# Patient Record
Sex: Female | Born: 1980 | Race: Black or African American | Hispanic: No | Marital: Married | Smoking: Former smoker
Health system: Southern US, Community
[De-identification: ages and names within clinical notes are randomized; demographics above are authoritative.]

## PROBLEM LIST (undated history)

## (undated) DIAGNOSIS — N920 Excessive and frequent menstruation with regular cycle: Secondary | ICD-10-CM

## (undated) DIAGNOSIS — E119 Type 2 diabetes mellitus without complications: Secondary | ICD-10-CM

## (undated) DIAGNOSIS — I739 Peripheral vascular disease, unspecified: Secondary | ICD-10-CM

## (undated) DIAGNOSIS — R519 Headache, unspecified: Secondary | ICD-10-CM

## (undated) DIAGNOSIS — G4733 Obstructive sleep apnea (adult) (pediatric): Secondary | ICD-10-CM

## (undated) DIAGNOSIS — I10 Essential (primary) hypertension: Secondary | ICD-10-CM

## (undated) DIAGNOSIS — N939 Abnormal uterine and vaginal bleeding, unspecified: Secondary | ICD-10-CM

## (undated) HISTORY — DX: Peripheral vascular disease, unspecified: I73.9

## (undated) HISTORY — PX: OTHER SURGICAL HISTORY: SHX169

---

## 2000-06-05 ENCOUNTER — Emergency Department (HOSPITAL_COMMUNITY): Admission: EM | Admit: 2000-06-05 | Discharge: 2000-06-05 | Payer: Self-pay | Admitting: Emergency Medicine

## 2004-03-31 ENCOUNTER — Emergency Department: Payer: Self-pay | Admitting: Emergency Medicine

## 2004-04-01 ENCOUNTER — Ambulatory Visit: Payer: Self-pay | Admitting: Emergency Medicine

## 2004-04-04 ENCOUNTER — Inpatient Hospital Stay: Payer: Self-pay

## 2004-04-09 ENCOUNTER — Ambulatory Visit: Payer: Self-pay | Admitting: Family Medicine

## 2004-04-09 ENCOUNTER — Observation Stay: Payer: Self-pay

## 2004-04-09 ENCOUNTER — Inpatient Hospital Stay (HOSPITAL_COMMUNITY): Admission: AD | Admit: 2004-04-09 | Discharge: 2004-04-11 | Payer: Self-pay | Admitting: *Deleted

## 2004-04-21 ENCOUNTER — Observation Stay: Payer: Self-pay

## 2004-04-22 ENCOUNTER — Inpatient Hospital Stay: Payer: Self-pay

## 2004-04-23 ENCOUNTER — Ambulatory Visit: Payer: Self-pay | Admitting: Obstetrics and Gynecology

## 2004-04-29 ENCOUNTER — Inpatient Hospital Stay: Payer: Self-pay | Admitting: Obstetrics and Gynecology

## 2004-06-04 ENCOUNTER — Emergency Department: Payer: Self-pay | Admitting: General Practice

## 2004-11-26 ENCOUNTER — Emergency Department: Payer: Self-pay | Admitting: Emergency Medicine

## 2005-04-02 ENCOUNTER — Emergency Department: Payer: Self-pay | Admitting: Emergency Medicine

## 2006-01-28 ENCOUNTER — Inpatient Hospital Stay: Payer: Self-pay | Admitting: Internal Medicine

## 2008-06-21 ENCOUNTER — Emergency Department: Payer: Self-pay | Admitting: Emergency Medicine

## 2009-02-03 DIAGNOSIS — S52041A Displaced fracture of coronoid process of right ulna, initial encounter for closed fracture: Secondary | ICD-10-CM

## 2009-02-03 HISTORY — DX: Displaced fracture of coronoid process of right ulna, initial encounter for closed fracture: S52.041A

## 2009-04-10 ENCOUNTER — Emergency Department: Payer: Self-pay | Admitting: Emergency Medicine

## 2009-04-12 ENCOUNTER — Emergency Department: Payer: Self-pay | Admitting: Emergency Medicine

## 2009-04-24 ENCOUNTER — Emergency Department: Payer: Self-pay | Admitting: Emergency Medicine

## 2009-04-25 ENCOUNTER — Emergency Department: Payer: Self-pay

## 2009-05-05 ENCOUNTER — Emergency Department: Payer: Self-pay | Admitting: Emergency Medicine

## 2009-05-24 ENCOUNTER — Emergency Department: Payer: Self-pay | Admitting: Emergency Medicine

## 2009-06-04 ENCOUNTER — Encounter: Payer: Self-pay | Admitting: Obstetrics & Gynecology

## 2009-06-06 ENCOUNTER — Inpatient Hospital Stay: Payer: Self-pay | Admitting: Obstetrics and Gynecology

## 2009-06-11 ENCOUNTER — Encounter: Payer: Self-pay | Admitting: Obstetrics & Gynecology

## 2009-06-21 ENCOUNTER — Observation Stay: Payer: Self-pay | Admitting: Obstetrics and Gynecology

## 2009-11-22 ENCOUNTER — Inpatient Hospital Stay: Payer: Self-pay | Admitting: Obstetrics and Gynecology

## 2009-11-27 LAB — PATHOLOGY REPORT

## 2010-07-29 ENCOUNTER — Emergency Department: Payer: Self-pay | Admitting: Emergency Medicine

## 2011-10-30 ENCOUNTER — Emergency Department: Payer: Self-pay | Admitting: Emergency Medicine

## 2011-11-02 ENCOUNTER — Emergency Department: Payer: Self-pay | Admitting: Emergency Medicine

## 2012-06-20 ENCOUNTER — Emergency Department: Payer: Self-pay | Admitting: Emergency Medicine

## 2013-05-28 ENCOUNTER — Emergency Department: Payer: Self-pay | Admitting: Emergency Medicine

## 2013-05-29 LAB — BASIC METABOLIC PANEL
Anion Gap: 5 — ABNORMAL LOW (ref 7–16)
BUN: 11 mg/dL (ref 7–18)
CHLORIDE: 106 mmol/L (ref 98–107)
CREATININE: 0.96 mg/dL (ref 0.60–1.30)
Calcium, Total: 9.2 mg/dL (ref 8.5–10.1)
Co2: 27 mmol/L (ref 21–32)
EGFR (African American): >60
Glucose: 197 mg/dL — ABNORMAL HIGH (ref 65–99)
Osmolality: 281 (ref 275–301)
Potassium: 3.7 mmol/L (ref 3.5–5.1)
Sodium: 138 mmol/L (ref 136–145)

## 2013-05-29 LAB — CBC
HCT: 35.1 % (ref 35.0–47.0)
HGB: 11 g/dL — ABNORMAL LOW (ref 12.0–16.0)
MCH: 22.1 pg — AB (ref 26.0–34.0)
MCHC: 31.3 g/dL — ABNORMAL LOW (ref 32.0–36.0)
MCV: 71 fL — AB (ref 80–100)
Platelet: 275 10*3/uL (ref 150–440)
RBC: 4.98 10*6/uL (ref 3.80–5.20)
RDW: 17.9 % — ABNORMAL HIGH (ref 11.5–14.5)
WBC: 9.9 10*3/uL (ref 3.6–11.0)

## 2013-05-29 LAB — URINALYSIS, COMPLETE
BILIRUBIN, UR: NEGATIVE
Bacteria: NONE SEEN
Ketone: NEGATIVE
LEUKOCYTE ESTERASE: NEGATIVE
NITRITE: NEGATIVE
PH: 6 (ref 4.5–8.0)
Protein: NEGATIVE
RBC,UR: <1 /HPF (ref 0–5)
SPECIFIC GRAVITY: 1.015 (ref 1.003–1.030)
WBC UR: 3 /HPF (ref 0–5)

## 2013-05-29 LAB — PRO B NATRIURETIC PEPTIDE: B-TYPE NATIURETIC PEPTID: 7 pg/mL (ref 0–125)

## 2013-05-29 LAB — TROPONIN I
Troponin-I: <0.02 ng/mL
Troponin-I: <0.02 ng/mL

## 2013-06-01 ENCOUNTER — Emergency Department: Payer: Self-pay | Admitting: Internal Medicine

## 2013-06-01 LAB — CBC
HCT: 39.4 % (ref 35.0–47.0)
HGB: 12 g/dL (ref 12.0–16.0)
MCH: 21.5 pg — AB (ref 26.0–34.0)
MCHC: 30.4 g/dL — AB (ref 32.0–36.0)
MCV: 71 fL — ABNORMAL LOW (ref 80–100)
PLATELETS: 325 10*3/uL (ref 150–440)
RBC: 5.57 10*6/uL — ABNORMAL HIGH (ref 3.80–5.20)
RDW: 18.1 % — ABNORMAL HIGH (ref 11.5–14.5)
WBC: 10.8 10*3/uL (ref 3.6–11.0)

## 2013-06-01 LAB — BASIC METABOLIC PANEL
Anion Gap: 7 (ref 7–16)
BUN: 11 mg/dL (ref 7–18)
CO2: 25 mmol/L (ref 21–32)
Calcium, Total: 9.1 mg/dL (ref 8.5–10.1)
Chloride: 103 mmol/L (ref 98–107)
Creatinine: 0.79 mg/dL (ref 0.60–1.30)
EGFR (African American): >60
EGFR (Non-African Amer.): >60
Glucose: 179 mg/dL — ABNORMAL HIGH (ref 65–99)
OSMOLALITY: 274 (ref 275–301)
Potassium: 3.6 mmol/L (ref 3.5–5.1)
Sodium: 135 mmol/L — ABNORMAL LOW (ref 136–145)

## 2013-06-01 LAB — TROPONIN I: Troponin-I: <0.02 ng/mL

## 2014-10-04 ENCOUNTER — Encounter: Payer: Self-pay | Admitting: Emergency Medicine

## 2014-10-04 ENCOUNTER — Emergency Department
Admission: EM | Admit: 2014-10-04 | Discharge: 2014-10-04 | Disposition: A | Discharge status: Home / Self Care | Payer: Self-pay | Attending: Emergency Medicine | Admitting: Emergency Medicine

## 2014-10-04 DIAGNOSIS — E1165 Type 2 diabetes mellitus with hyperglycemia: Secondary | ICD-10-CM | POA: Insufficient documentation

## 2014-10-04 DIAGNOSIS — Z79899 Other long term (current) drug therapy: Secondary | ICD-10-CM | POA: Insufficient documentation

## 2014-10-04 DIAGNOSIS — Z794 Long term (current) use of insulin: Secondary | ICD-10-CM | POA: Insufficient documentation

## 2014-10-04 HISTORY — DX: Type 2 diabetes mellitus without complications: E11.9

## 2014-10-04 LAB — CBC
HCT: 34.8 % — ABNORMAL LOW (ref 35.0–47.0)
HEMOGLOBIN: 10.6 g/dL — AB (ref 12.0–16.0)
MCH: 20.9 pg — AB (ref 26.0–34.0)
MCHC: 30.5 g/dL — AB (ref 32.0–36.0)
MCV: 68.5 fL — ABNORMAL LOW (ref 80.0–100.0)
PLATELETS: 364 10*3/uL (ref 150–440)
RBC: 5.08 MIL/uL (ref 3.80–5.20)
RDW: 18.3 % — AB (ref 11.5–14.5)
WBC: 9.5 10*3/uL (ref 3.6–11.0)

## 2014-10-04 LAB — URINALYSIS COMPLETE WITH MICROSCOPIC (ARMC ONLY)
BILIRUBIN URINE: NEGATIVE
Bacteria, UA: NONE SEEN
Ketones, ur: NEGATIVE mg/dL
Nitrite: NEGATIVE
Protein, ur: NEGATIVE mg/dL
SPECIFIC GRAVITY, URINE: 1.022 (ref 1.005–1.030)
pH: 6 (ref 5.0–8.0)

## 2014-10-04 LAB — BASIC METABOLIC PANEL
Anion gap: 10 (ref 5–15)
BUN: 9 mg/dL (ref 6–20)
CALCIUM: 9.1 mg/dL (ref 8.9–10.3)
CO2: 22 mmol/L (ref 22–32)
CREATININE: 0.86 mg/dL (ref 0.44–1.00)
Chloride: 101 mmol/L (ref 101–111)
GFR calc Af Amer: >60 mL/min (ref >60)
GFR calc non Af Amer: >60 mL/min (ref >60)
GLUCOSE: 430 mg/dL — AB (ref 65–99)
Potassium: 3.7 mmol/L (ref 3.5–5.1)
Sodium: 133 mmol/L — ABNORMAL LOW (ref 135–145)

## 2014-10-04 LAB — GLUCOSE, CAPILLARY: Glucose-Capillary: 455 mg/dL — ABNORMAL HIGH (ref 65–99)

## 2014-10-04 MED ORDER — SODIUM CHLORIDE 0.9 % IV BOLUS (SEPSIS)
1000.0000 mL | Freq: Once | INTRAVENOUS | Status: AC
  Administered 2014-10-04: 1000 mL via INTRAVENOUS

## 2014-10-04 MED ORDER — INSULIN ASPART PROT & ASPART (70-30 MIX) 100 UNIT/ML ~~LOC~~ SUSP: 10.0000 [IU] | Freq: Every day | SUBCUTANEOUS | Status: DC

## 2014-10-04 MED ORDER — SODIUM CHLORIDE 0.9 % IV BOLUS (SEPSIS)
1000.0000 mL | Freq: Once | INTRAVENOUS | Status: AC
  Administered 2014-10-04 (×2): 1000 mL via INTRAVENOUS

## 2014-10-04 MED ORDER — INSULIN ASPART PROT & ASPART (70-30 MIX) 100 UNIT/ML ~~LOC~~ SUSP
10.0000 [IU] | Freq: Once | SUBCUTANEOUS | Status: AC
  Administered 2014-10-04: 10 [IU] via SUBCUTANEOUS
  Filled 2014-10-04: qty 10

## 2014-10-04 NOTE — ED Provider Notes (Signed)
[facility] Emergency Department Provider Note  ____________________________________________  Time seen: 4944  I have reviewed the triage vital signs and the nursing notes.   HISTORY  Chief Complaint Hyperglycemia and Flank Pain     HPI Jennifer [name] is a 34 y.o. female who has diabetes. She presents today due to concerns for her elevated blood sugar. The patient was diagnosed with diabetes last year. She goes to [employer]. She has not been back in approximately a year. She has had no change in her insulin regimen. She reports she has been urinating more frequently and her sugar has been running high. She has continued to take the sliding dose of insulin that was first prescribed. She reports this is generally 10 units at a time. She is not sure what type of insulin she is taking. I suspect that it is short acting.  Other than the frequency of urination, she denies any dysuria. She has no abdominal pain.She denies flank pain to me. She does report she has dark urine.  She's had no fevers. She's had no nausea or vomiting.    Past Medical History  Diagnosis Date  . Diabetes mellitus without complication     There are no active problems to display for this patient.   No past surgical history on file.  Current Outpatient Rx  Name  Route  Sig  Dispense  Refill  . acetaminophen (TYLENOL) 500 MG tablet   Oral   Take 1,000 mg by mouth every 6 (six) hours as needed for mild pain or headache.         . lisinopril-hydrochlorothiazide (PRINZIDE,ZESTORETIC) 10-12.5 MG per tablet   Oral   Take 1 tablet by mouth daily.         . insulin aspart protamine- aspart (NOVOLOG MIX 70/30) (70-30) 100 UNIT/ML injection   Subcutaneous   Inject 0.1 mLs (10 Units total) into the skin daily with breakfast.   10 mL   11     Allergies Demerol; Oxycodone; and Toradol  No family history on file.  Social History Social History  Substance Use Topics  .  Smoking status: Not on file  . Smokeless tobacco: Not on file  . Alcohol Use: Not on file    Review of Systems  Constitutional: Negative for fever. General fatigue. ENT: Negative for sore throat. Cardiovascular: Negative for chest pain. Respiratory: Negative for shortness of breath. Gastrointestinal: Negative for abdominal pain, vomiting and diarrhea. Genitourinary: Negative for dysuria. Notable for increased urination. Musculoskeletal: No myalgias or injuries. Skin: Negative for rash. Neurological: Negative for headaches   10-point ROS otherwise negative.  ____________________________________________   PHYSICAL EXAM:  VITAL SIGNS: ED Triage Vitals  Enc Vitals Group     BP 10/04/14 1341 184/99 mmHg     Pulse Rate 10/04/14 1341 104     Resp 10/04/14 1341 18     Temp 10/04/14 1341 98.6 F (37 C)     Temp Source 10/04/14 1341 Oral     SpO2 10/04/14 1341 97 %     Weight 10/04/14 1341 187 lb (84.823 kg)     Height --      Head Cir --      Peak Flow --      Pain Score 10/04/14 1341 8     Pain Loc --      Pain Edu? --      Excl. in [place]? --     Constitutional:  Alert and oriented. Well appearing and in  no distress. ENT   Head: Normocephalic and atraumatic.   Nose: No congestion/rhinnorhea.   Mouth/Throat: Mucous membranes are moist. Cardiovascular: Normal rate, regular rhythm, no murmur noted Respiratory:  Normal respiratory effort, no tachypnea.    Breath sounds are clear and equal bilaterally.  Gastrointestinal: Soft and nontender. No distention.  Back: No muscle spasm, no tenderness, no CVA tenderness. Musculoskeletal: No deformity noted. Nontender with normal range of motion in all extremities.  No noted edema. Neurologic:  Normal speech and language. No gross focal neurologic deficits are appreciated.  Skin:  Skin is warm, dry. No rash noted. Psychiatric: Mood and affect are normal. Speech and behavior are normal.   ____________________________________________    LABS (pertinent positives/negatives)  Labs Reviewed  GLUCOSE, CAPILLARY - Abnormal; Notable for the following:    Glucose-Capillary 455 (*)    All other components within normal limits  BASIC METABOLIC PANEL - Abnormal; Notable for the following:    Sodium 133 (*)    Glucose, Bld 430 (*)    All other components within normal limits  CBC - Abnormal; Notable for the following:    Hemoglobin 10.6 (*)    HCT 34.8 (*)    MCV 68.5 (*)    MCH 20.9 (*)    MCHC 30.5 (*)    RDW 18.3 (*)    All other components within normal limits  URINALYSIS COMPLETEWITH MICROSCOPIC ([facility] ONLY) - Abnormal; Notable for the following:    Color, Urine COLORLESS (*)    APPearance CLEAR (*)    Glucose, UA >500 (*)    Hgb urine dipstick 2+ (*)    Leukocytes, UA TRACE (*)    Squamous Epithelial / LPF 0-5 (*)    All other components within normal limits  HEMOGLOBIN A1C  CBG MONITORING, ED   ____________________________________________   INITIAL IMPRESSION / ASSESSMENT AND PLAN / ED COURSE  Pertinent labs & imaging results that were available during my care of the patient were reviewed by me and considered in my medical decision making (see chart for details).   Pleasant well-appearing 34 year old female in no acute distress. She has history of diabetes, but has not followed up with her regular doctor for ongoing care. I believe her current hyperglycemia is due to inadequate treatment and inadequate follow-up. She does not have a fever or sign of infection that would trigger an elevated glucose level. She does not seem to be that aware of her treatment plan.  Her renal function is good. We will treat her with 2 L of normal saline due to the hyperglycemia. I have discussed a change in her insulin regimen. We will start her on 70/30 and give her 10 units now. The patient understands that as we make changes in her insulin, she could be at risk for hypoglycemia.  She does have a functional glucometer at home. She reports she will check her sugars and keep a log and adjust the doses if needed. I have underscored the importance of close follow-up with [name] for further care.  ____________________________________________   FINAL CLINICAL IMPRESSION(S) / ED DIAGNOSES  Final diagnoses:  Hyperglycemia due to type 2 diabetes mellitus      Ahmed Prima, MD 10/04/14 [phone]

## 2014-10-04 NOTE — ED Notes (Signed)
NAD noted at time of D/C. Pt refused wheelchair to the lobby. Pt denies comments/concerns at this time.

## 2014-10-04 NOTE — Discharge Instructions (Signed)
Your sugar is running high, but we do not see a obvious cause for this. You do not have a urinary tract infection. U appear well otherwise. We have treated you with 2 L of IV fluids and started a slightly longer acting insulin for you. Your are given 70/30, 10 units, in the emergency department. Continue to use 10 units in the morning as long as her sugar is normal to high. Use your other insulin as a sliding scale as previously prescribed. Keep a log of your glucose levels as well as the amount of insulin you're using see her doctor can help tailor your treatment to what she need. Decrease or discontinue the insulin if your blood sugars are getting too low. Most important, follow-up with your regular doctor for long-term care of your diabetes. Return to the emergency department if you have further urgent concerns.  High Blood Sugar High blood sugar (hyperglycemia) means that the level of sugar in your blood is higher than it should be. Signs of high blood sugar include:  Feeling thirsty.  Frequent peeing (urinating).  Feeling tired or sleepy.  Dry mouth.  Vision changes.  Feeling weak.  Feeling hungry but losing weight.  Numbness and tingling in your hands or feet.  Headache. When you ignore these signs, your blood sugar may keep going up. These problems may get worse, and other problems may begin. HOME CARE  Check your blood sugars as told by your doctor. Write down the numbers with the date and time.  Take the right amount of insulin or diabetes pills at the right time. Write down the dose with date and time.  Refill your insulin or diabetes pills before running out.  Watch what you eat. Follow your meal plan.  Drink liquids without sugar, such as water. Check with your doctor if you have kidney or heart disease.  Follow your doctor's orders for exercise. Exercise at the same time of day.  Keep your doctor's appointments. GET HELP RIGHT AWAY IF:   You have trouble thinking  or are confused.  You have fast breathing with fruity smelling breath.  You pass out (faint).  You have 2 to 3 days of high blood sugars and you do not know why.  You have chest pain.  You are feeling sick to your stomach (nauseous) or throwing up (vomiting).  You have sudden vision changes. MAKE SURE YOU:   Understand these instructions.  Will watch your condition.  Will get help right away if you are not doing well or get worse. Document Released: 11/17/2008 Document Revised: 04/14/2011 Document Reviewed: 11/17/2008 [facility] Patient Information 2015 [place], [place]. This information is not intended to replace advice given to you by your health care provider. Make sure you discuss any questions you have with your health care provider.

## 2014-10-04 NOTE — ED Notes (Signed)
Pt reports fatigue, dark urine, decreased appetite and blurred vision.  Pt is a diabetic. Notice her glucose levels were high yesterday.  Pt have taken her insulin on sliding scale and glucose remains elevated.

## 2014-10-04 NOTE — ED Notes (Signed)
Pt's CBG 248. NAD noted at this time. Pt states she does not feel any better. Resting comfortably in bed at this time.

## 2014-10-05 LAB — GLUCOSE, CAPILLARY: GLUCOSE-CAPILLARY: 248 mg/dL — AB (ref 65–99)

## 2014-10-05 LAB — HEMOGLOBIN A1C: Hgb A1c MFr Bld: 9.4 % — ABNORMAL HIGH (ref 4.0–6.0)

## 2014-10-09 ENCOUNTER — Encounter: Payer: Self-pay | Admitting: Emergency Medicine

## 2014-10-09 ENCOUNTER — Emergency Department
Admission: EM | Admit: 2014-10-09 | Discharge: 2014-10-10 | Discharge status: Against Medical Advice | Payer: Self-pay | Attending: Emergency Medicine | Admitting: Emergency Medicine

## 2014-10-09 DIAGNOSIS — E1165 Type 2 diabetes mellitus with hyperglycemia: Secondary | ICD-10-CM | POA: Insufficient documentation

## 2014-10-09 DIAGNOSIS — Z720 Tobacco use: Secondary | ICD-10-CM | POA: Insufficient documentation

## 2014-10-09 DIAGNOSIS — I10 Essential (primary) hypertension: Secondary | ICD-10-CM | POA: Insufficient documentation

## 2014-10-09 DIAGNOSIS — Z3202 Encounter for pregnancy test, result negative: Secondary | ICD-10-CM | POA: Insufficient documentation

## 2014-10-09 LAB — CBC
HCT: 33.6 % — ABNORMAL LOW (ref 35.0–47.0)
Hemoglobin: 10.3 g/dL — ABNORMAL LOW (ref 12.0–16.0)
MCH: 20.9 pg — AB (ref 26.0–34.0)
MCHC: 30.8 g/dL — AB (ref 32.0–36.0)
MCV: 67.9 fL — ABNORMAL LOW (ref 80.0–100.0)
PLATELETS: 355 10*3/uL (ref 150–440)
RBC: 4.94 MIL/uL (ref 3.80–5.20)
RDW: 17.9 % — AB (ref 11.5–14.5)
WBC: 9.6 10*3/uL (ref 3.6–11.0)

## 2014-10-09 LAB — URINALYSIS COMPLETE WITH MICROSCOPIC (ARMC ONLY)
Bacteria, UA: NONE SEEN
Bilirubin Urine: NEGATIVE
NITRITE: NEGATIVE
Protein, ur: NEGATIVE mg/dL
SPECIFIC GRAVITY, URINE: 1.024 (ref 1.005–1.030)
pH: 6 (ref 5.0–8.0)

## 2014-10-09 LAB — BASIC METABOLIC PANEL
ANION GAP: 8 (ref 5–15)
BUN: 16 mg/dL (ref 6–20)
CALCIUM: 9 mg/dL (ref 8.9–10.3)
CO2: 26 mmol/L (ref 22–32)
CREATININE: 0.89 mg/dL (ref 0.44–1.00)
Chloride: 98 mmol/L — ABNORMAL LOW (ref 101–111)
GFR calc Af Amer: >60 mL/min (ref >60)
GLUCOSE: 377 mg/dL — AB (ref 65–99)
Potassium: 3.8 mmol/L (ref 3.5–5.1)
Sodium: 132 mmol/L — ABNORMAL LOW (ref 135–145)

## 2014-10-09 LAB — POCT PREGNANCY, URINE: Preg Test, Ur: NEGATIVE

## 2014-10-09 LAB — GLUCOSE, CAPILLARY: Glucose-Capillary: 414 mg/dL — ABNORMAL HIGH (ref 65–99)

## 2014-10-09 NOTE — ED Notes (Signed)
Patient ambulatory to triage with steady gait, without difficulty or distress noted; pt reports here Wednesday for hyperglycemia; c/o persistent elevated FS with generalized HA

## 2014-10-12 ENCOUNTER — Emergency Department
Admission: EM | Admit: 2014-10-12 | Discharge: 2014-10-12 | Disposition: A | Discharge status: Home / Self Care | Payer: Self-pay | Attending: Student | Admitting: Student

## 2014-10-12 ENCOUNTER — Encounter: Payer: Self-pay | Admitting: Emergency Medicine

## 2014-10-12 ENCOUNTER — Telehealth: Payer: Self-pay | Admitting: Emergency Medicine

## 2014-10-12 DIAGNOSIS — Z79899 Other long term (current) drug therapy: Secondary | ICD-10-CM | POA: Insufficient documentation

## 2014-10-12 DIAGNOSIS — I10 Essential (primary) hypertension: Secondary | ICD-10-CM | POA: Insufficient documentation

## 2014-10-12 DIAGNOSIS — Z3202 Encounter for pregnancy test, result negative: Secondary | ICD-10-CM | POA: Insufficient documentation

## 2014-10-12 DIAGNOSIS — Z720 Tobacco use: Secondary | ICD-10-CM | POA: Insufficient documentation

## 2014-10-12 DIAGNOSIS — Z794 Long term (current) use of insulin: Secondary | ICD-10-CM | POA: Insufficient documentation

## 2014-10-12 DIAGNOSIS — E1165 Type 2 diabetes mellitus with hyperglycemia: Secondary | ICD-10-CM | POA: Insufficient documentation

## 2014-10-12 HISTORY — DX: Essential (primary) hypertension: I10

## 2014-10-12 LAB — URINALYSIS COMPLETE WITH MICROSCOPIC (ARMC ONLY)
BACTERIA UA: NONE SEEN
Bilirubin Urine: NEGATIVE
Glucose, UA: >500 mg/dL — AB
HGB URINE DIPSTICK: NEGATIVE
NITRITE: NEGATIVE
PH: 6 (ref 5.0–8.0)
PROTEIN: NEGATIVE mg/dL
Specific Gravity, Urine: 1.02 (ref 1.005–1.030)

## 2014-10-12 LAB — CBC
HEMATOCRIT: 34.4 % — AB (ref 35.0–47.0)
Hemoglobin: 10.6 g/dL — ABNORMAL LOW (ref 12.0–16.0)
MCH: 21.4 pg — ABNORMAL LOW (ref 26.0–34.0)
MCHC: 30.9 g/dL — ABNORMAL LOW (ref 32.0–36.0)
MCV: 69.3 fL — AB (ref 80.0–100.0)
Platelets: 326 10*3/uL (ref 150–440)
RBC: 4.97 MIL/uL (ref 3.80–5.20)
RDW: 18.6 % — ABNORMAL HIGH (ref 11.5–14.5)
WBC: 10.2 10*3/uL (ref 3.6–11.0)

## 2014-10-12 LAB — BASIC METABOLIC PANEL
Anion gap: 10 (ref 5–15)
BUN: 13 mg/dL (ref 6–20)
CALCIUM: 9.4 mg/dL (ref 8.9–10.3)
CHLORIDE: 98 mmol/L — AB (ref 101–111)
CO2: 22 mmol/L (ref 22–32)
CREATININE: 0.89 mg/dL (ref 0.44–1.00)
GFR calc non Af Amer: >60 mL/min (ref >60)
Glucose, Bld: 404 mg/dL — ABNORMAL HIGH (ref 65–99)
Potassium: 4.4 mmol/L (ref 3.5–5.1)
SODIUM: 130 mmol/L — AB (ref 135–145)

## 2014-10-12 LAB — PREGNANCY, URINE: Preg Test, Ur: NEGATIVE

## 2014-10-12 LAB — GLUCOSE, CAPILLARY
GLUCOSE-CAPILLARY: 480 mg/dL — AB (ref 65–99)
GLUCOSE-CAPILLARY: 250 mg/dL — AB (ref 65–99)
Glucose-Capillary: 362 mg/dL — ABNORMAL HIGH (ref 65–99)

## 2014-10-12 MED ORDER — INSULIN ASPART 100 UNIT/ML ~~LOC~~ SOLN
10.0000 [IU] | Freq: Once | SUBCUTANEOUS | Status: AC
  Administered 2014-10-12: 10 [IU] via SUBCUTANEOUS
  Filled 2014-10-12: qty 10

## 2014-10-12 MED ORDER — INSULIN ASPART 100 UNIT/ML ~~LOC~~ SOLN
SUBCUTANEOUS | Status: AC
  Filled 2014-10-12: qty 1

## 2014-10-12 MED ORDER — KETOROLAC TROMETHAMINE 30 MG/ML IJ SOLN
30.0000 mg | Freq: Once | INTRAMUSCULAR | Status: AC
  Administered 2014-10-12: 30 mg via INTRAVENOUS
  Filled 2014-10-12: qty 1

## 2014-10-12 MED ORDER — ONDANSETRON HCL 4 MG/2ML IJ SOLN
4.0000 mg | Freq: Once | INTRAMUSCULAR | Status: AC
  Administered 2014-10-12: 4 mg via INTRAVENOUS
  Filled 2014-10-12: qty 2

## 2014-10-12 MED ORDER — SODIUM CHLORIDE 0.9 % IV BOLUS (SEPSIS)
1000.0000 mL | Freq: Once | INTRAVENOUS | Status: AC
  Administered 2014-10-12: 1000 mL via INTRAVENOUS

## 2014-10-12 NOTE — ED Notes (Signed)
Pt states blood sugar has been elevated x2 weeks. Pt states she is very weak. Does c/o visual changes, "black dots". States she was at her PCP office yesterday and they did increase her insulin dosage. However today, before she came in she took her blood sugar and it was 591.

## 2014-10-12 NOTE — ED Provider Notes (Signed)
[facility] Emergency Department Provider Note  ____________________________________________  Time seen: Approximately 5:38 PM  I have reviewed the triage vital signs and the nursing notes.   HISTORY  Chief Complaint Hyperglycemia    HPI Jennifer [name] is a 34 y.o. female with hypertension and diabetes who presents for evaluation of hyperglycemia.The patient was seen here on 10/04/2014 for elevated blood sugar and her insulin regimen was titrated. She was seen at the [facility] yesterday where her blood sugar was persistently elevated and her insulin regimen was increased to 20 units nightly as opposed to 10 units nightly. She reports that she gave herself her insulin as prescribed however today when she checked her blood sugar it was again elevated so she presents to the emergency department for evaluation. No nausea, vomiting, diarrhea, fevers or chills. She has had several weeks of worsening blurred vision and polyuria. She has also had 3 days of intermittent throbbing frontal headache, current severity 8 out of 10. No modifying factors. She was started on ciprofloxacin for urinary tract infection yesterday.   Past Medical History  Diagnosis Date  . Diabetes mellitus without complication   . Hypertension     There are no active problems to display for this patient.   No past surgical history on file.  Current Outpatient Rx  Name  Route  Sig  Dispense  Refill  . acetaminophen (TYLENOL) 500 MG tablet   Oral   Take 1,000 mg by mouth every 6 (six) hours as needed for mild pain or headache.         . insulin aspart protamine- aspart (NOVOLOG MIX 70/30) (70-30) 100 UNIT/ML injection   Subcutaneous   Inject 0.1 mLs (10 Units total) into the skin daily with breakfast.   10 mL   11   . lisinopril-hydrochlorothiazide (PRINZIDE,ZESTORETIC) 10-12.5 MG per tablet   Oral   Take 1 tablet by mouth daily.           Allergies Demerol; Oxycodone;  and Toradol  No family history on file.  Social History Social History  Substance Use Topics  . Smoking status: Current Every Day Smoker -- 0.50 packs/day    Types: Cigarettes  . Smokeless tobacco: Not on file  . Alcohol Use: No    Review of Systems Constitutional: No fever/chills Eyes: No visual changes. ENT: No sore throat. Cardiovascular: Denies chest pain. Respiratory: Denies shortness of breath. Gastrointestinal: No abdominal pain.  No nausea, no vomiting.  No diarrhea.  No constipation. Genitourinary: Negative for dysuria. Musculoskeletal: Negative for back pain. Skin: Negative for rash. Neurological: Negative for headaches, focal weakness or numbness.  10-point ROS otherwise negative.  ____________________________________________   PHYSICAL EXAM:  VITAL SIGNS: ED Triage Vitals  Enc Vitals Group     BP 10/12/14 1529 155/102 mmHg     Pulse Rate 10/12/14 1529 91     Resp 10/12/14 1529 18     Temp 10/12/14 1529 98.3 F (36.8 C)     Temp Source 10/12/14 1529 Oral     SpO2 10/12/14 1529 100 %     Weight 10/12/14 1529 187 lb (84.823 kg)     Height 10/12/14 1529 5' 6.5" (1.689 m)     Head Cir --      Peak Flow --      Pain Score 10/12/14 1530 0     Pain Loc --      Pain Edu? --      Excl. in [place]? --  Constitutional: Alert and oriented. Well appearing and in no acute distress. Eyes: Conjunctivae are normal. PERRL. EOMI. Head: Atraumatic. Nose: No congestion/rhinnorhea. Mouth/Throat: Mucous membranes are moist.  Oropharynx non-erythematous. Neck: No stridor. Neck supple without meningismus. Cardiovascular: Normal rate, regular rhythm. Grossly normal heart sounds.  Good peripheral circulation. Respiratory: Normal respiratory effort.  No retractions. Lungs CTAB. Gastrointestinal: Soft and nontender. No distention. No abdominal bruits. No CVA tenderness. Genitourinary: deferred Musculoskeletal: No lower extremity tenderness nor edema.  No joint  effusions. Neurologic:  Normal speech and language. No gross focal neurologic deficits are appreciated.  Skin:  Skin is warm, dry and intact. No rash noted. Psychiatric: Mood and affect are normal. Speech and behavior are normal.  ____________________________________________   LABS (all labs ordered are listed, but only abnormal results are displayed)  Labs Reviewed  GLUCOSE, CAPILLARY - Abnormal; Notable for the following:    Glucose-Capillary 480 (*)    All other components within normal limits  CBC - Abnormal; Notable for the following:    Hemoglobin 10.6 (*)    HCT 34.4 (*)    MCV 69.3 (*)    MCH 21.4 (*)    MCHC 30.9 (*)    RDW 18.6 (*)    All other components within normal limits  URINALYSIS COMPLETEWITH MICROSCOPIC ([facility] ONLY) - Abnormal; Notable for the following:    Color, Urine STRAW (*)    APPearance CLEAR (*)    Glucose, UA >500 (*)    Ketones, ur TRACE (*)    Leukocytes, UA TRACE (*)    Squamous Epithelial / LPF 0-5 (*)    All other components within normal limits  BASIC METABOLIC PANEL - Abnormal; Notable for the following:    Sodium 130 (*)    Chloride 98 (*)    Glucose, Bld 404 (*)    All other components within normal limits  GLUCOSE, CAPILLARY - Abnormal; Notable for the following:    Glucose-Capillary 362 (*)    All other components within normal limits  GLUCOSE, CAPILLARY - Abnormal; Notable for the following:    Glucose-Capillary 250 (*)    All other components within normal limits  PREGNANCY, URINE  CBG MONITORING, ED   ____________________________________________  EKG  none ____________________________________________  RADIOLOGY  none ____________________________________________   PROCEDURES  Procedure(s) performed: None  Critical Care performed: No  ____________________________________________   INITIAL IMPRESSION / ASSESSMENT AND PLAN / ED COURSE  Pertinent labs & imaging results that were available during my care of the  patient were reviewed by me and considered in my medical decision making (see chart for details).  Jennifer [name] is a 34 y.o. female with hypertension and diabetes who presents for evaluation of hyperglycemia. Exam, she is very well-appearing and in no acute distress. Vital signs stable with the exception of mild hypertension and she has a documented history of hypertension. Her exam is benign, she has an intact neurological examination. Labs notable for hyperglycemia without DKA. Glucose 404. She has pseudohyponatremia which corrects appropriately for the degree of hyperglycemia. CBC notable for stable chronic anemia with hemoglobin 10.6 which appears to be her baseline. Urine pregnancy test is negative. Urinalysis with trace leuk esterase but otherwise no evidence of urinary tract infection. We'll give IV fluids, insulin and anticipate discharge when her blood sugar improves. She is complaining of a throbbing frontal headache which I suspect may be tension headache, doubt subarachnoid hemorrhage or meningitis, we'll give Toradol and Zofran. I have encouraged her to continue with her insulin regimen and  follow up with her primary care doctor, we'll give information on nutrition as well.  ----------------------------------------- 8:16 PM on 10/12/2014 ----------------------------------------- Patient with improvement in her headache. Blood glucose has improved to ~250. Visual acuity is 20/200 in the left eye, right eye and both eyes. I suspect her vision has been worsening for some time and is likely a complication of her diabetes/secondary to retinopathy. I Recommended she see an optometrist. Discussed return precautions and need for close PCP follow-up and she is comfortable with the discharge plan.  ____________________________________________   FINAL CLINICAL IMPRESSION(S) / ED DIAGNOSES  Final diagnoses:  Hyperglycemia due to type 2 diabetes mellitus      Joanne Gavel, MD 10/12/14  2213

## 2014-10-12 NOTE — ED Notes (Signed)
Called patient due to lwot to inquire about condition and follow up plans. Patient says she has followed up with her doctor at [facility].  Says she is being treated for uti.  She also says her sugar was 500 this am and she has no appetitie.  Asking about what else she can do.  i told her to call pcp and inform of her sugar and that she  Has no appetite.  She agrees.

## 2014-10-12 NOTE — ED Notes (Signed)
Pt complains of high blood sugar past couple weeks with headache, blurred vision, and polyuria.

## 2014-10-12 NOTE — ED Notes (Signed)
Visual Acuity test completed:  Right eye: 20/200 at 20 ft Left eye 20/200 at 20 ft Bilateral 20/200 at 20 ft

## 2014-10-29 ENCOUNTER — Emergency Department: Payer: Self-pay

## 2014-10-29 ENCOUNTER — Emergency Department
Admission: EM | Admit: 2014-10-29 | Discharge: 2014-10-29 | Disposition: A | Discharge status: Home / Self Care | Payer: Self-pay | Attending: Emergency Medicine | Admitting: Emergency Medicine

## 2014-10-29 ENCOUNTER — Encounter: Payer: Self-pay | Admitting: Emergency Medicine

## 2014-10-29 DIAGNOSIS — R739 Hyperglycemia, unspecified: Secondary | ICD-10-CM

## 2014-10-29 DIAGNOSIS — Z79899 Other long term (current) drug therapy: Secondary | ICD-10-CM | POA: Insufficient documentation

## 2014-10-29 DIAGNOSIS — E1165 Type 2 diabetes mellitus with hyperglycemia: Secondary | ICD-10-CM | POA: Insufficient documentation

## 2014-10-29 DIAGNOSIS — J00 Acute nasopharyngitis [common cold]: Secondary | ICD-10-CM | POA: Insufficient documentation

## 2014-10-29 DIAGNOSIS — Z720 Tobacco use: Secondary | ICD-10-CM | POA: Insufficient documentation

## 2014-10-29 DIAGNOSIS — I10 Essential (primary) hypertension: Secondary | ICD-10-CM | POA: Insufficient documentation

## 2014-10-29 DIAGNOSIS — R000 Tachycardia, unspecified: Secondary | ICD-10-CM | POA: Insufficient documentation

## 2014-10-29 DIAGNOSIS — Z794 Long term (current) use of insulin: Secondary | ICD-10-CM | POA: Insufficient documentation

## 2014-10-29 LAB — CBC
HCT: 35.8 % (ref 35.0–47.0)
HEMOGLOBIN: 11.5 g/dL — AB (ref 12.0–16.0)
MCH: 21.8 pg — ABNORMAL LOW (ref 26.0–34.0)
MCHC: 32.1 g/dL (ref 32.0–36.0)
MCV: 68 fL — ABNORMAL LOW (ref 80.0–100.0)
Platelets: 280 10*3/uL (ref 150–440)
RBC: 5.26 MIL/uL — AB (ref 3.80–5.20)
RDW: 18.3 % — ABNORMAL HIGH (ref 11.5–14.5)
WBC: 9.7 10*3/uL (ref 3.6–11.0)

## 2014-10-29 LAB — URINALYSIS COMPLETE WITH MICROSCOPIC (ARMC ONLY)
BACTERIA UA: NONE SEEN
Bilirubin Urine: NEGATIVE
Glucose, UA: >500 mg/dL — AB
Ketones, ur: NEGATIVE mg/dL
LEUKOCYTES UA: NEGATIVE
Nitrite: NEGATIVE
PH: 6 (ref 5.0–8.0)
PROTEIN: NEGATIVE mg/dL
Specific Gravity, Urine: 1.027 (ref 1.005–1.030)

## 2014-10-29 LAB — COMPREHENSIVE METABOLIC PANEL
ALBUMIN: 4.2 g/dL (ref 3.5–5.0)
ALK PHOS: 65 U/L (ref 38–126)
ALT: 15 U/L (ref 14–54)
ANION GAP: 13 (ref 5–15)
AST: 29 U/L (ref 15–41)
BUN: 15 mg/dL (ref 6–20)
CALCIUM: 9.9 mg/dL (ref 8.9–10.3)
CO2: 23 mmol/L (ref 22–32)
Chloride: 93 mmol/L — ABNORMAL LOW (ref 101–111)
Creatinine, Ser: 0.98 mg/dL (ref 0.44–1.00)
GFR calc Af Amer: >60 mL/min (ref >60)
GFR calc non Af Amer: >60 mL/min (ref >60)
GLUCOSE: 576 mg/dL — AB (ref 65–99)
POTASSIUM: 4.1 mmol/L (ref 3.5–5.1)
SODIUM: 129 mmol/L — AB (ref 135–145)
Total Bilirubin: 0.4 mg/dL (ref 0.3–1.2)
Total Protein: 8.6 g/dL — ABNORMAL HIGH (ref 6.5–8.1)

## 2014-10-29 LAB — GLUCOSE, CAPILLARY
GLUCOSE-CAPILLARY: 568 mg/dL — AB (ref 65–99)
GLUCOSE-CAPILLARY: 569 mg/dL — AB (ref 65–99)

## 2014-10-29 MED ORDER — SODIUM CHLORIDE 0.9 % IV SOLN
1000.0000 mL | Freq: Once | INTRAVENOUS | Status: AC
  Administered 2014-10-29: 1000 mL via INTRAVENOUS

## 2014-10-29 MED ORDER — INSULIN ASPART 100 UNIT/ML ~~LOC~~ SOLN
10.0000 [IU] | Freq: Once | SUBCUTANEOUS | Status: AC
  Administered 2014-10-29: 10 [IU] via SUBCUTANEOUS
  Filled 2014-10-29: qty 10

## 2014-10-29 MED ORDER — INSULIN ASPART 100 UNIT/ML ~~LOC~~ SOLN
5.0000 [IU] | Freq: Once | SUBCUTANEOUS | Status: AC
  Administered 2014-10-29: 5 [IU] via INTRAVENOUS
  Filled 2014-10-29: qty 5

## 2014-10-29 NOTE — ED Notes (Signed)
Fingerstick BGL 245

## 2014-10-29 NOTE — ED Notes (Signed)
Fingerstick BLG - 359

## 2014-10-29 NOTE — ED Notes (Signed)
CRITICAL VALUE ALERT  Critical value received:  Blood glucose  Date of notification:  10/29/14  Time of notification: 0929  Critical value read back: yes  Nurse who received alert:  Narda Rutherford, RN  MD notified (1st page):    Time of first page:    MD notified (2nd page):  Time of second page:  Responding MD:  Corky Downs, MD  Time MD responded:  [phone]

## 2014-10-29 NOTE — Discharge Instructions (Signed)

## 2014-10-29 NOTE — ED Provider Notes (Signed)
[facility] Emergency Department Provider Note  ____________________________________________  Time seen: On arrival  I have reviewed the triage vital signs and the nursing notes.   HISTORY  Chief Complaint Hyperglycemia    HPI Jennifer [name] is a 34 y.o. female who presents with complaints of hyperglycemia. She has a history of diabetes and hypertension. She has had difficulty controlling her sugar over the last month. She has been seeing her physician weekly he was been increasing her dose of NovoLog. She also takes metformin. She denies fevers chills. No cough. She does have a mild cold. Denies dysuria or abdominal pain. She reports polyuria and polydipsia     Past Medical History  Diagnosis Date  . Diabetes mellitus without complication   . Hypertension     There are no active problems to display for this patient.   History reviewed. No pertinent past surgical history.  Current Outpatient Rx  Name  Route  Sig  Dispense  Refill  . acetaminophen (TYLENOL) 500 MG tablet   Oral   Take 1,000 mg by mouth every 6 (six) hours as needed for mild pain or headache.         . insulin aspart protamine- aspart (NOVOLOG MIX 70/30) (70-30) 100 UNIT/ML injection   Subcutaneous   Inject 0.1 mLs (10 Units total) into the skin daily with breakfast.   10 mL   11   . lisinopril-hydrochlorothiazide (PRINZIDE,ZESTORETIC) 10-12.5 MG per tablet   Oral   Take 1 tablet by mouth daily.           Allergies Demerol; Oxycodone; and Toradol  No family history on file.  Social History Social History  Substance Use Topics  . Smoking status: Current Every Day Smoker -- 0.50 packs/day    Types: Cigarettes  . Smokeless tobacco: None  . Alcohol Use: No    Review of Systems  Constitutional: Negative for fever. Eyes: Negative for visual changes. ENT: Negative for sore throat Cardiovascular: Negative for chest pain. Respiratory: Negative for shortness of  breath. Gastrointestinal: Negative for abdominal pain, vomiting and diarrhea. Genitourinary: Negative for dysuria. Positive polyuria Musculoskeletal: Negative for back pain. Skin: Negative for rash. Neurological: Negative for headaches or focal weakness Psychiatric: No anxiety    ____________________________________________   PHYSICAL EXAM:  VITAL SIGNS: ED Triage Vitals  Enc Vitals Group     BP 10/29/14 1758 162/112 mmHg     Pulse Rate 10/29/14 1758 105     Resp 10/29/14 1758 18     Temp 10/29/14 1758 98.4 F (36.9 C)     Temp Source 10/29/14 1758 Oral     SpO2 10/29/14 1758 100 %     Weight 10/29/14 1758 187 lb (84.823 kg)     Height 10/29/14 1758 5\' 6"  (1.676 m)     Head Cir --      Peak Flow --      Pain Score 10/29/14 1759 6     Pain Loc --      Pain Edu? --      Excl. in [place]? --      Constitutional: Alert and oriented. Well appearing and in no distress. Eyes: Conjunctivae are normal.  ENT   Head: Normocephalic and atraumatic.   Mouth/Throat: Mucous membranes are moist. Cardiovascular: Tachycardia, regular rhythm. Normal and symmetric distal pulses are present in all extremities. No murmurs, rubs, or gallops. Respiratory: Normal respiratory effort without tachypnea nor retractions. Breath sounds are clear and equal bilaterally.  Gastrointestinal: Soft and non-tender  in all quadrants. No distention. There is no CVA tenderness. Genitourinary: deferred Musculoskeletal: Nontender with normal range of motion in all extremities. No lower extremity tenderness nor edema. Neurologic:  Normal speech and language. No gross focal neurologic deficits are appreciated. Skin:  Skin is warm, dry and intact. No rash noted. Psychiatric: Mood and affect are normal. Patient exhibits appropriate insight and judgment.  ____________________________________________    LABS (pertinent positives/negatives)  Labs Reviewed  CBC - Abnormal; Notable for the following:    RBC 5.26  (*)    Hemoglobin 11.5 (*)    MCV 68.0 (*)    MCH 21.8 (*)    RDW 18.3 (*)    All other components within normal limits  COMPREHENSIVE METABOLIC PANEL - Abnormal; Notable for the following:    Sodium 129 (*)    Chloride 93 (*)    Glucose, Bld 576 (*)    Total Protein 8.6 (*)    All other components within normal limits  URINALYSIS COMPLETEWITH MICROSCOPIC ([facility] ONLY) - Abnormal; Notable for the following:    Color, Urine COLORLESS (*)    APPearance CLEAR (*)    Glucose, UA >500 (*)    Hgb urine dipstick 1+ (*)    Squamous Epithelial / LPF 0-5 (*)    All other components within normal limits  GLUCOSE, CAPILLARY - Abnormal; Notable for the following:    Glucose-Capillary 569 (*)    All other components within normal limits  GLUCOSE, CAPILLARY - Abnormal; Notable for the following:    Glucose-Capillary 568 (*)    All other components within normal limits    ____________________________________________   EKG  ED ECG REPORT I, Lavonia Drafts, the attending physician, personally viewed and interpreted this ECG.   Date: 10/29/2014  EKG Time: 6:15 PM  Rate: 101  Rhythm: sinus tachycardia  Axis: Normal axis  Intervals:none  ST&T Change: Nonspecific   ____________________________________________    RADIOLOGY I have personally reviewed any xrays that were ordered on this patient: Chest x-ray unremarkable  ____________________________________________   PROCEDURES  Procedure(s) performed: none  Critical Care performed: none  ____________________________________________   INITIAL IMPRESSION / ASSESSMENT AND PLAN / ED COURSE  Pertinent labs & imaging results that were available during my care of the patient were reviewed by me and considered in my medical decision making (see chart for details).  Patient with glucose greater than 500. We will give normal saline 1 L bolus to rehydrate the patient. In addition we will give 10 units of NovoLog subcutaneous and  reevaluate  After fluids and 2 doses of insulin patient's blood sugar is 257. She feels well. While signs are unremarkable. She is stable for discharge with PCP follow-up  ____________________________________________   FINAL CLINICAL IMPRESSION(S) / ED DIAGNOSES  Final diagnoses:  Hyperglycemia     Lavonia Drafts, MD 10/29/14 2153

## 2014-10-29 NOTE — ED Notes (Signed)
BIB EMS from home pt c/o of high blood glucose for past month. States her glucometer at home reads High. Pt reports taking Novolog 60 units @ 1300 and Metformin 1000mg  with no change in blood glucose numbers. Pt also c/o chest pain 6/10 with cold symptoms.

## 2014-10-30 LAB — GLUCOSE, CAPILLARY
Glucose-Capillary: 359 mg/dL — ABNORMAL HIGH (ref 65–99)
Glucose-Capillary: 245 mg/dL — ABNORMAL HIGH (ref 65–99)

## 2014-11-13 ENCOUNTER — Inpatient Hospital Stay
Admission: EM | Admit: 2014-11-13 | Discharge: 2014-11-15 | DRG: 638 | Disposition: A | Discharge status: Home / Self Care | Payer: Self-pay | Attending: Internal Medicine | Admitting: Internal Medicine

## 2014-11-13 ENCOUNTER — Encounter: Payer: Self-pay | Admitting: Emergency Medicine

## 2014-11-13 DIAGNOSIS — Z79899 Other long term (current) drug therapy: Secondary | ICD-10-CM

## 2014-11-13 DIAGNOSIS — R350 Frequency of micturition: Secondary | ICD-10-CM | POA: Diagnosis present

## 2014-11-13 DIAGNOSIS — N179 Acute kidney failure, unspecified: Secondary | ICD-10-CM | POA: Diagnosis present

## 2014-11-13 DIAGNOSIS — R3915 Urgency of urination: Secondary | ICD-10-CM | POA: Diagnosis present

## 2014-11-13 DIAGNOSIS — Z6830 Body mass index (BMI) 30.0-30.9, adult: Secondary | ICD-10-CM

## 2014-11-13 DIAGNOSIS — Z794 Long term (current) use of insulin: Secondary | ICD-10-CM

## 2014-11-13 DIAGNOSIS — E1165 Type 2 diabetes mellitus with hyperglycemia: Principal | ICD-10-CM | POA: Diagnosis present

## 2014-11-13 DIAGNOSIS — Z7984 Long term (current) use of oral hypoglycemic drugs: Secondary | ICD-10-CM

## 2014-11-13 DIAGNOSIS — E663 Overweight: Secondary | ICD-10-CM | POA: Diagnosis present

## 2014-11-13 DIAGNOSIS — F17200 Nicotine dependence, unspecified, uncomplicated: Secondary | ICD-10-CM | POA: Diagnosis present

## 2014-11-13 DIAGNOSIS — R739 Hyperglycemia, unspecified: Secondary | ICD-10-CM | POA: Diagnosis present

## 2014-11-13 DIAGNOSIS — I10 Essential (primary) hypertension: Secondary | ICD-10-CM | POA: Diagnosis present

## 2014-11-13 DIAGNOSIS — E1101 Type 2 diabetes mellitus with hyperosmolarity with coma: Secondary | ICD-10-CM

## 2014-11-13 DIAGNOSIS — E860 Dehydration: Secondary | ICD-10-CM | POA: Diagnosis present

## 2014-11-13 DIAGNOSIS — E870 Hyperosmolality and hypernatremia: Secondary | ICD-10-CM | POA: Diagnosis present

## 2014-11-13 DIAGNOSIS — Z885 Allergy status to narcotic agent status: Secondary | ICD-10-CM

## 2014-11-13 LAB — BASIC METABOLIC PANEL
ANION GAP: 14 (ref 5–15)
BUN: 20 mg/dL (ref 6–20)
CALCIUM: 9.8 mg/dL (ref 8.9–10.3)
CO2: 22 mmol/L (ref 22–32)
Chloride: 82 mmol/L — ABNORMAL LOW (ref 101–111)
Creatinine, Ser: 1.3 mg/dL — ABNORMAL HIGH (ref 0.44–1.00)
GFR, EST NON AFRICAN AMERICAN: 52 mL/min — AB (ref >60)
Glucose, Bld: 1345 mg/dL (ref 65–99)
POTASSIUM: 5.5 mmol/L — AB (ref 3.5–5.1)
Sodium: 118 mmol/L — CL (ref 135–145)

## 2014-11-13 LAB — GLUCOSE, CAPILLARY
GLUCOSE-CAPILLARY: 499 mg/dL — AB (ref 65–99)
GLUCOSE-CAPILLARY: 310 mg/dL — AB (ref 65–99)
Glucose-Capillary: >600 mg/dL (ref 65–99)

## 2014-11-13 LAB — BLOOD GAS, VENOUS
Acid-base deficit: 8.8 mmol/L — ABNORMAL HIGH (ref 0.0–2.0)
Bicarbonate: 17.4 mEq/L — ABNORMAL LOW (ref 21.0–28.0)
FIO2: 0.21
O2 Saturation: 61.2 %
PCO2 VEN: 38 mmHg — AB (ref 44.0–60.0)
PH VEN: 7.27 — AB (ref 7.320–7.430)
Patient temperature: 37
pO2, Ven: 37 mmHg (ref 30.0–45.0)

## 2014-11-13 LAB — URINALYSIS COMPLETE WITH MICROSCOPIC (ARMC ONLY)
BACTERIA UA: NONE SEEN
Bilirubin Urine: NEGATIVE
HGB URINE DIPSTICK: NEGATIVE
LEUKOCYTES UA: NEGATIVE
NITRITE: NEGATIVE
PH: 6 (ref 5.0–8.0)
PROTEIN: NEGATIVE mg/dL
SPECIFIC GRAVITY, URINE: 1.023 (ref 1.005–1.030)
Squamous Epithelial / LPF: NONE SEEN

## 2014-11-13 LAB — CBC
HEMATOCRIT: 41.7 % (ref 35.0–47.0)
HEMOGLOBIN: 11.5 g/dL — AB (ref 12.0–16.0)
MCH: 21.4 pg — ABNORMAL LOW (ref 26.0–34.0)
MCHC: 27.7 g/dL — ABNORMAL LOW (ref 32.0–36.0)
MCV: 77.5 fL — ABNORMAL LOW (ref 80.0–100.0)
Platelets: 305 10*3/uL (ref 150–440)
RBC: 5.38 MIL/uL — AB (ref 3.80–5.20)
RDW: 19.6 % — ABNORMAL HIGH (ref 11.5–14.5)
WBC: 11.6 10*3/uL — ABNORMAL HIGH (ref 3.6–11.0)

## 2014-11-13 LAB — BETA-HYDROXYBUTYRIC ACID: Beta-Hydroxybutyric Acid: 2.49 mmol/L — ABNORMAL HIGH (ref 0.05–0.27)

## 2014-11-13 LAB — MRSA PCR SCREENING: MRSA BY PCR: NEGATIVE

## 2014-11-13 MED ORDER — SODIUM CHLORIDE 0.9 % IV SOLN
Freq: Once | INTRAVENOUS | Status: AC
  Administered 2014-11-13: 18:00:00 via INTRAVENOUS

## 2014-11-13 MED ORDER — POTASSIUM CHLORIDE 10 MEQ/100ML IV SOLN
10.0000 meq | INTRAVENOUS | Status: AC
  Administered 2014-11-13: 10 meq via INTRAVENOUS
  Filled 2014-11-13 (×2): qty 100

## 2014-11-13 MED ORDER — SODIUM CHLORIDE 0.9 % IV SOLN: Freq: Once | INTRAVENOUS | Status: DC

## 2014-11-13 MED ORDER — ACETAMINOPHEN 325 MG PO TABS
650.0000 mg | ORAL_TABLET | Freq: Once | ORAL | Status: AC
  Administered 2014-11-13: 650 mg via ORAL
  Filled 2014-11-13: qty 2

## 2014-11-13 MED ORDER — SODIUM CHLORIDE 0.9 % IV SOLN
INTRAVENOUS | Status: DC
  Administered 2014-11-13: 21:00:00 via INTRAVENOUS

## 2014-11-13 MED ORDER — SODIUM CHLORIDE 0.9 % IV BOLUS (SEPSIS)
1000.0000 mL | Freq: Once | INTRAVENOUS | Status: AC
  Administered 2014-11-13: 1000 mL via INTRAVENOUS

## 2014-11-13 MED ORDER — SODIUM CHLORIDE 0.9 % IV SOLN
INTRAVENOUS | Status: DC
  Filled 2014-11-13: qty 2.5

## 2014-11-13 MED ORDER — ENOXAPARIN SODIUM 40 MG/0.4ML ~~LOC~~ SOLN
40.0000 mg | SUBCUTANEOUS | Status: DC
  Administered 2014-11-13 – 2014-11-14 (×2): 40 mg via SUBCUTANEOUS
  Filled 2014-11-13 (×2): qty 0.4

## 2014-11-13 MED ORDER — SODIUM CHLORIDE 0.9 % IV SOLN: INTRAVENOUS | Status: AC

## 2014-11-13 MED ORDER — DEXTROSE-NACL 5-0.45 % IV SOLN
INTRAVENOUS | Status: DC
  Administered 2014-11-14 (×2): via INTRAVENOUS

## 2014-11-13 MED ORDER — ACETAMINOPHEN 325 MG PO TABS
650.0000 mg | ORAL_TABLET | ORAL | Status: DC | PRN
  Administered 2014-11-13 – 2014-11-14 (×2): 650 mg via ORAL
  Filled 2014-11-13 (×2): qty 2

## 2014-11-13 MED ORDER — INSULIN REGULAR HUMAN 100 UNIT/ML IJ SOLN
INTRAMUSCULAR | Status: DC
  Administered 2014-11-13: 5.4 [IU]/h via INTRAVENOUS
  Administered 2014-11-14: 5.5 [IU]/h via INTRAVENOUS
  Filled 2014-11-13: qty 2.5

## 2014-11-13 NOTE — ED Provider Notes (Signed)
[facility] Emergency Department Provider Note  ____________________________________________  Time seen: Approximately 5:53 PM  I have reviewed the triage vital signs and the nursing notes.   HISTORY  Chief Complaint Hyperglycemia    HPI Jennifer [name] is a 34 y.o. female patient reports she's been feeling weak and very ill for several days. Her doctor has been increasing her insulin doses for some weeks and her sugars been going up anyway. She's having urinary frequency and urgency and losing weight. She's not having dysuria. She denies having any fever she has had somewhat of a headache does not have any cough no abdominal pain nothing else is bothering her. Glucose here is 1345. Fluids have been started and give her an insulin drip.   Past Medical History  Diagnosis Date  . Diabetes mellitus without complication ([place])   . Hypertension     There are no active problems to display for this patient.   History reviewed. No pertinent past surgical history.  Current Outpatient Rx  Name  Route  Sig  Dispense  Refill  . insulin detemir (LEVEMIR) 100 UNIT/ML injection   Subcutaneous   Inject 60 Units into the skin at bedtime.         . insulin regular (NOVOLIN R,HUMULIN R) 100 units/mL injection   Subcutaneous   Inject 15 Units into the skin 3 (three) times daily before meals.         Marland Kitchen lisinopril (PRINIVIL,ZESTRIL) 10 MG tablet   Oral   Take 10 mg by mouth daily.         . metFORMIN (GLUCOPHAGE) 1000 MG tablet   Oral   Take 1,000 mg by mouth 2 (two) times daily with a meal.         . insulin aspart protamine- aspart (NOVOLOG MIX 70/30) (70-30) 100 UNIT/ML injection   Subcutaneous   Inject 0.1 mLs (10 Units total) into the skin daily with breakfast.   10 mL   11   . insulin NPH-regular Human (NOVOLIN 70/30) (70-30) 100 UNIT/ML injection   Subcutaneous   Inject 60 Units into the skin 2 (two) times daily.            Allergies Demerol; Oxycodone; and Toradol  History reviewed. No pertinent family history.  Social History Social History  Substance Use Topics  . Smoking status: Former Smoker -- 0.00 packs/day  . Smokeless tobacco: None  . Alcohol Use: No    Review of Systems Constitutional: No fever/chills Eyes: No visual changes. ENT: No sore throat. Cardiovascular: Denies chest pain. Respiratory: Denies shortness of breath. Gastrointestinal: No abdominal pain.  No nausea, no vomiting.  No diarrhea.  No constipation. Genitourinary: Negative for dysuria. Musculoskeletal: Negative for back pain. Skin: Negative for rash. Neurological: Negative for focal weakness or numbness.  10-point ROS otherwise negative.  ____________________________________________   PHYSICAL EXAM:  VITAL SIGNS: ED Triage Vitals  Enc Vitals Group     BP 11/13/14 1543 150/98 mmHg     Pulse Rate 11/13/14 1543 87     Resp 11/13/14 1543 16     Temp 11/13/14 1543 97.7 F (36.5 C)     Temp Source 11/13/14 1543 Oral     SpO2 11/13/14 1543 98 %     Weight 11/13/14 1543 188 lb (85.276 kg)     Height 11/13/14 1543 5' 6.5" (1.689 m)     Head Cir --      Peak Flow --      Pain Score 11/13/14  1543 6     Pain Loc --      Pain Edu? --      Excl. in [place]? --     Constitutional: Alert and oriented. Well appearing and in no acute distress. Eyes: Conjunctivae are normal. PERRL. EOMI. Head: Atraumatic. Nose: No congestion/rhinnorhea. Mouth/Throat: Mucous membranes are moist.  Oropharynx non-erythematous. Neck: No stridor. Neck is supple there are no nodes Cardiovascular: Normal rate, regular rhythm. Grossly normal heart sounds.  Good peripheral circulation. Respiratory: Normal respiratory effort.  No retractions. Lungs CTAB. Gastrointestinal: Soft and nontender. No distention. No abdominal bruits. No CVA tenderness. Musculoskeletal: No lower extremity tenderness nor edema.  No joint effusions. Neurologic:  Normal  speech and language. No gross focal neurologic deficits are appreciated. No gait instability. Skin:  Skin is warm, dry and intact. No rash noted.   ____________________________________________   LABS (all labs ordered are listed, but only abnormal results are displayed)  Labs Reviewed  BASIC METABOLIC PANEL - Abnormal; Notable for the following:    Sodium 118 (*)    Potassium 5.5 (*)    Chloride 82 (*)    Glucose, Bld 1345 (*)    Creatinine, Ser 1.30 (*)    GFR calc non Af Amer 52 (*)    All other components within normal limits  CBC - Abnormal; Notable for the following:    WBC 11.6 (*)    RBC 5.38 (*)    Hemoglobin 11.5 (*)    MCV 77.5 (*)    MCH 21.4 (*)    MCHC 27.7 (*)    RDW 19.6 (*)    All other components within normal limits  URINALYSIS COMPLETEWITH MICROSCOPIC ([facility] ONLY) - Abnormal; Notable for the following:    Color, Urine COLORLESS (*)    APPearance CLEAR (*)    Glucose, UA >500 (*)    Ketones, ur 1+ (*)    All other components within normal limits  GLUCOSE, CAPILLARY - Abnormal; Notable for the following:    Glucose-Capillary >600 (*)    All other components within normal limits  GLUCOSE, CAPILLARY - Abnormal; Notable for the following:    Glucose-Capillary >600 (*)    All other components within normal limits  BLOOD GAS, VENOUS  BETA-HYDROXYBUTYRIC ACID  CBG MONITORING, ED   ____________________________________________  EKG   ____________________________________________  RADIOLOGY   ____________________________________________   PROCEDURES   ____________________________________________   INITIAL IMPRESSION / ASSESSMENT AND PLAN / ED COURSE  Pertinent labs & imaging results that were available during my care of the patient were reviewed by me and considered in my medical decision making (see chart for details).   ____________________________________________   FINAL CLINICAL IMPRESSION(S) / ED DIAGNOSES  Final diagnoses:   Hyperglycemic hyperosmolar nonketotic coma ([place])    diagnosis is actually hyperglycemic hyperosmotic nonketotic state without coma   Nena Polio, MD 11/13/14 1758

## 2014-11-13 NOTE — H&P (Signed)
[facility] [place] NAME: Jennifer [name]    MR#:  [id number]  DATE OF BIRTH:  10-13-1980  DATE OF ADMISSION:  11/13/2014  PRIMARY CARE PHYSICIAN:  [facility]   REQUESTING/REFERRING PHYSICIAN: Dr. Corinna Capra  CHIEF COMPLAINT:   Chief Complaint  Patient presents with  . Hyperglycemia    HISTORY OF PRESENT ILLNESS: Jennifer [name]  is a 34 y.o. female with a known history of  diabetes2 and hypertension who states that her blood sugars were running in the 400s to 500s for long period of time. Patient Reports that her sugars over the past few days there were so high that unable to register on her glucose meter.  Patient's blood sugars were noted to be 1300 here. She is started on IV fluids and started on insulin drip.       PAST MEDICAL HISTORY:   Past Medical History  Diagnosis Date  . Diabetes mellitus without complication ([place])   . Hypertension     PAST SURGICAL HISTORY:  Past Surgical History  Procedure Laterality Date  . None      SOCIAL HISTORY:  Social History  Substance Use Topics  . Smoking status: Former Smoker -- 0.00 packs/day  . Smokeless tobacco: Not on file  . Alcohol Use: No    FAMILY HISTORY:  Family History  Problem Relation Age of Onset  . Diabetes    . Hypertension      DRUG ALLERGIES:  Allergies  Allergen Reactions  . Demerol [Meperidine] Hives, Itching and Nausea And Vomiting  . Oxycodone Hives, Itching and Nausea And Vomiting  . Toradol [Ketorolac Tromethamine] Hives, Itching and Nausea And Vomiting    REVIEW OF SYSTEMS:   CONSTITUTIONAL: No fever, positive fatigue and weakness.  EYES: No blurred or double vision.  EARS, NOSE, AND THROAT: No tinnitus or ear pain.  RESPIRATORY: No cough, shortness of breath, wheezing or hemoptysis.  CARDIOVASCULAR: No chest pain, orthopnea, edema.  GASTROINTESTINAL: No nausea, vomiting, diarrhea or abdominal pain.  GENITOURINARY: No  dysuria, hematuria.  ENDOCRINE: Positive polyuria, and nocturia,  HEMATOLOGY: No anemia, easy bruising or bleeding SKIN: No rash or lesion. MUSCULOSKELETAL: No joint pain or arthritis.   NEUROLOGIC: No tingling, numbness, weakness.  PSYCHIATRY: No anxiety or depression.   MEDICATIONS AT HOME:  Prior to Admission medications   Medication Sig Start Date End Date Taking? Authorizing Provider  insulin detemir (LEVEMIR) 100 UNIT/ML injection Inject 60 Units into the skin daily.    Yes Historical Provider, MD  insulin regular (NOVOLIN R,HUMULIN R) 100 units/mL injection Inject 15 Units into the skin as needed (with meals/snacks).    Yes Historical Provider, MD  lisinopril (PRINIVIL,ZESTRIL) 10 MG tablet Take 10 mg by mouth daily.   Yes Historical Provider, MD  metFORMIN (GLUCOPHAGE) 1000 MG tablet Take 1,000 mg by mouth 2 (two) times daily.    Yes Historical Provider, MD  insulin aspart protamine- aspart (NOVOLOG MIX 70/30) (70-30) 100 UNIT/ML injection Inject 0.1 mLs (10 Units total) into the skin daily with breakfast. Patient not taking: Reported on 11/13/2014 10/04/14   Ahmed Prima, MD      PHYSICAL EXAMINATION:   VITAL SIGNS: Blood pressure 156/103, pulse 96, temperature 97.7 F (36.5 C), temperature source Oral, resp. rate 16, height 5' 6.5" (1.689 m), weight 85.276 kg (188 lb), last menstrual period 11/06/2014, SpO2 100 %.  GENERAL:  34 y.o.-year-old patient lying in the bed critically ill-appearing very dry  EYES: Pupils  equal, round, reactive to light and accommodation. No scleral icterus. Extraocular muscles intact.  HEENT: Head atraumatic, normocephalic. Oropharynx and nasopharynx clear.  NECK:  Supple, no jugular venous distention. No thyroid enlargement, no tenderness.  LUNGS: Normal breath sounds bilaterally, no wheezing, rales,rhonchi or crepitation. No use of accessory muscles of respiration.  CARDIOVASCULAR: S1, S2 normal. No murmurs, rubs, or gallops.  ABDOMEN: Soft,  nontender, nondistended. Bowel sounds present. No organomegaly or mass.  EXTREMITIES: No pedal edema, cyanosis, or clubbing.  NEUROLOGIC: Cranial nerves II through XII are intact. Muscle strength 5/5 in all extremities. Sensation intact. Gait not checked.  PSYCHIATRIC: The patient is alert and oriented x 3.  SKIN: No obvious rash, lesion, or ulcer.   LABORATORY PANEL:   CBC  Recent Labs Lab 11/13/14 1559  WBC 11.6*  HGB 11.5*  HCT 41.7  PLT 305  MCV 77.5*  MCH 21.4*  MCHC 27.7*  RDW 19.6*   ------------------------------------------------------------------------------------------------------------------  Chemistries   Recent Labs Lab 11/13/14 1559  NA 118*  K 5.5*  CL 82*  CO2 22  GLUCOSE 1345*  BUN 20  CREATININE 1.30*  CALCIUM 9.8   ------------------------------------------------------------------------------------------------------------------ estimated creatinine clearance is 66.5 mL/min (by C-G formula based on Cr of 1.3). ------------------------------------------------------------------------------------------------------------------ No results for input(s): TSH, T4TOTAL, T3FREE, THYROIDAB in the last 72 hours.  Invalid input(s): FREET3   Coagulation profile No results for input(s): INR, PROTIME in the last 168 hours. ------------------------------------------------------------------------------------------------------------------- No results for input(s): DDIMER in the last 72 hours. -------------------------------------------------------------------------------------------------------------------  Cardiac Enzymes No results for input(s): CKMB, TROPONINI, MYOGLOBIN in the last 168 hours.  Invalid input(s): CK ------------------------------------------------------------------------------------------------------------------ Invalid input(s):  POCBNP  ---------------------------------------------------------------------------------------------------------------  Urinalysis    Component Value Date/Time   COLORURINE COLORLESS* 11/13/2014 1559   COLORURINE Yellow 05/28/2013 2350   APPEARANCEUR CLEAR* 11/13/2014 1559   APPEARANCEUR Hazy 05/28/2013 2350   LABSPEC 1.023 11/13/2014 1559   LABSPEC 1.015 05/28/2013 2350   PHURINE 6.0 11/13/2014 1559   PHURINE 6.0 05/28/2013 2350   GLUCOSEU >500* 11/13/2014 1559   GLUCOSEU >=500 05/28/2013 2350   HGBUR NEGATIVE 11/13/2014 1559   HGBUR 1+ 05/28/2013 2350   BILIRUBINUR NEGATIVE 11/13/2014 1559   BILIRUBINUR Negative 05/28/2013 2350   KETONESUR 1+* 11/13/2014 1559   KETONESUR Negative 05/28/2013 2350   PROTEINUR NEGATIVE 11/13/2014 1559   PROTEINUR Negative 05/28/2013 2350   NITRITE NEGATIVE 11/13/2014 1559   NITRITE Negative 05/28/2013 2350   LEUKOCYTESUR NEGATIVE 11/13/2014 1559   LEUKOCYTESUR Negative 05/28/2013 2350     RADIOLOGY: No results found.  EKG: Orders placed or performed during the hospital encounter of 10/29/14  . EKG 12-Lead  . EKG 12-Lead  . EKG    IMPRESSION AND PLAN: Patient is a 34 year old African-American female with type 2 diabetes with severe hyperglycemia  1. Severe hyperglycemia likely due to poor blood glucose control, at this time will give her aggressive IV fluids started on a non-DKA insulin drip. Once her blood sugars are less then 300 she could be started on Levemir. We'll also ask endocrinology to evaluate the patient for him assistance with blood control, check hemoglobin A1c   2. Severe dehydration give her IV fluid  3. Acute kidney injury due to dehydration we'll give her IV fluids    All the records are reviewed and case discussed with ED provider. Management plans discussed with the patient, family and they are in agreement.  CODE STATUS: Full    TOTAL TIME TAKING CARE OF THIS PATIENT: 55 minutes. Critical care  time  Dustin Flock M.D on 11/13/2014 at 6:34 PM  Between 7am to 6pm - Pager - [phone]  After 6pm go to [email or web address] - password [email or web address] [facility]  Office  [phone]  CC: Primary care physician; PROVIDER NOT IN SYSTEM

## 2014-11-13 NOTE — ED Notes (Signed)
Lab notified to add Hgb A1C

## 2014-11-13 NOTE — ED Notes (Signed)
Lab notified to add Beta-hydroxbutyric acid

## 2014-11-13 NOTE — ED Notes (Signed)
Patient presents to the ED with hyperglycemia, weakness, tired, feeling thirsty and lack of appetite.  Patient states she checked her blood sugar x 4 today and her meter read HIGH each time.  Patient states this is the 6th time she has been to the ED with high blood sugar in the past month.  Patient reports MD increasing her dose of insulin with no improvement.  Patient reports taking 60 units of levemir, 15 units of humulin R with food and 100mg  metformin 2x day.  Patient appears tired in triage.

## 2014-11-13 NOTE — Progress Notes (Signed)
Spoke with Dr. Lavetta Nielsen concerning pt's complaints of headache.  Per MD, order tylenol 650mg  q4hr prn for headache.  Also spoke with MD concerning pt's potassium level and the need to actually do the potassium replacement bags ordered.  Per MD, do not give potassium bags.

## 2014-11-14 LAB — GLUCOSE, CAPILLARY
GLUCOSE-CAPILLARY: 237 mg/dL — AB (ref 65–99)
GLUCOSE-CAPILLARY: 153 mg/dL — AB (ref 65–99)
GLUCOSE-CAPILLARY: 157 mg/dL — AB (ref 65–99)
GLUCOSE-CAPILLARY: 167 mg/dL — AB (ref 65–99)
GLUCOSE-CAPILLARY: 178 mg/dL — AB (ref 65–99)
GLUCOSE-CAPILLARY: 212 mg/dL — AB (ref 65–99)
GLUCOSE-CAPILLARY: 331 mg/dL — AB (ref 65–99)
GLUCOSE-CAPILLARY: 258 mg/dL — AB (ref 65–99)
GLUCOSE-CAPILLARY: 329 mg/dL — AB (ref 65–99)
Glucose-Capillary: 170 mg/dL — ABNORMAL HIGH (ref 65–99)
Glucose-Capillary: 184 mg/dL — ABNORMAL HIGH (ref 65–99)
Glucose-Capillary: 210 mg/dL — ABNORMAL HIGH (ref 65–99)
Glucose-Capillary: 215 mg/dL — ABNORMAL HIGH (ref 65–99)
Glucose-Capillary: 260 mg/dL — ABNORMAL HIGH (ref 65–99)
Glucose-Capillary: 266 mg/dL — ABNORMAL HIGH (ref 65–99)
Glucose-Capillary: 278 mg/dL — ABNORMAL HIGH (ref 65–99)
Glucose-Capillary: 279 mg/dL — ABNORMAL HIGH (ref 65–99)
Glucose-Capillary: 303 mg/dL — ABNORMAL HIGH (ref 65–99)
Glucose-Capillary: 311 mg/dL — ABNORMAL HIGH (ref 65–99)
Glucose-Capillary: 314 mg/dL — ABNORMAL HIGH (ref 65–99)
Glucose-Capillary: 400 mg/dL — ABNORMAL HIGH (ref 65–99)
Glucose-Capillary: >600 mg/dL (ref 65–99)

## 2014-11-14 LAB — BASIC METABOLIC PANEL
ANION GAP: 11 (ref 5–15)
ANION GAP: 4 — AB (ref 5–15)
ANION GAP: 7 (ref 5–15)
Anion gap: 10 (ref 5–15)
Anion gap: 6 (ref 5–15)
BUN: 15 mg/dL (ref 6–20)
BUN: 14 mg/dL (ref 6–20)
BUN: 13 mg/dL (ref 6–20)
BUN: 13 mg/dL (ref 6–20)
BUN: 15 mg/dL (ref 6–20)
CALCIUM: 8.9 mg/dL (ref 8.9–10.3)
CALCIUM: 8.6 mg/dL — AB (ref 8.9–10.3)
CHLORIDE: 105 mmol/L (ref 101–111)
CHLORIDE: 104 mmol/L (ref 101–111)
CO2: 20 mmol/L — AB (ref 22–32)
CO2: 20 mmol/L — ABNORMAL LOW (ref 22–32)
CO2: 22 mmol/L (ref 22–32)
CO2: 23 mmol/L (ref 22–32)
CO2: 23 mmol/L (ref 22–32)
CREATININE: 0.69 mg/dL (ref 0.44–1.00)
Calcium: 9.1 mg/dL (ref 8.9–10.3)
Calcium: 8.6 mg/dL — ABNORMAL LOW (ref 8.9–10.3)
Calcium: 8.6 mg/dL — ABNORMAL LOW (ref 8.9–10.3)
Chloride: 101 mmol/L (ref 101–111)
Chloride: 102 mmol/L (ref 101–111)
Chloride: 105 mmol/L (ref 101–111)
Creatinine, Ser: 0.81 mg/dL (ref 0.44–1.00)
Creatinine, Ser: 0.66 mg/dL (ref 0.44–1.00)
Creatinine, Ser: 0.74 mg/dL (ref 0.44–1.00)
Creatinine, Ser: 0.77 mg/dL (ref 0.44–1.00)
GFR calc Af Amer: >60 mL/min (ref >60)
GFR calc Af Amer: >60 mL/min (ref >60)
GFR calc Af Amer: >60 mL/min (ref >60)
GFR calc Af Amer: >60 mL/min (ref >60)
GFR calc Af Amer: >60 mL/min (ref >60)
GFR calc non Af Amer: >60 mL/min (ref >60)
GFR calc non Af Amer: >60 mL/min (ref >60)
GLUCOSE: 318 mg/dL — AB (ref 65–99)
GLUCOSE: 272 mg/dL — AB (ref 65–99)
Glucose, Bld: 276 mg/dL — ABNORMAL HIGH (ref 65–99)
Glucose, Bld: 169 mg/dL — ABNORMAL HIGH (ref 65–99)
Glucose, Bld: 301 mg/dL — ABNORMAL HIGH (ref 65–99)
POTASSIUM: 3.2 mmol/L — AB (ref 3.5–5.1)
POTASSIUM: 3.3 mmol/L — AB (ref 3.5–5.1)
POTASSIUM: 3.6 mmol/L (ref 3.5–5.1)
POTASSIUM: 3.9 mmol/L (ref 3.5–5.1)
POTASSIUM: 4.1 mmol/L (ref 3.5–5.1)
SODIUM: 132 mmol/L — AB (ref 135–145)
SODIUM: 134 mmol/L — AB (ref 135–145)
SODIUM: 135 mmol/L (ref 135–145)
Sodium: 131 mmol/L — ABNORMAL LOW (ref 135–145)
Sodium: 131 mmol/L — ABNORMAL LOW (ref 135–145)

## 2014-11-14 LAB — CBC
HEMATOCRIT: 33.9 % — AB (ref 35.0–47.0)
Hemoglobin: 10.6 g/dL — ABNORMAL LOW (ref 12.0–16.0)
MCH: 21.5 pg — ABNORMAL LOW (ref 26.0–34.0)
MCHC: 31.4 g/dL — ABNORMAL LOW (ref 32.0–36.0)
MCV: 68.2 fL — ABNORMAL LOW (ref 80.0–100.0)
PLATELETS: 295 10*3/uL (ref 150–440)
RBC: 4.96 MIL/uL (ref 3.80–5.20)
RDW: 18.2 % — AB (ref 11.5–14.5)
WBC: 10.7 10*3/uL (ref 3.6–11.0)

## 2014-11-14 LAB — HEMOGLOBIN A1C: Hgb A1c MFr Bld: 14.3 % — ABNORMAL HIGH (ref 4.0–6.0)

## 2014-11-14 MED ORDER — INSULIN ASPART 100 UNIT/ML ~~LOC~~ SOLN
0.0000 [IU] | Freq: Three times a day (TID) | SUBCUTANEOUS | Status: DC
  Administered 2014-11-15 (×2): 15 [IU] via SUBCUTANEOUS
  Filled 2014-11-14 (×2): qty 15

## 2014-11-14 MED ORDER — POTASSIUM CHLORIDE 20 MEQ PO PACK
40.0000 meq | PACK | Freq: Two times a day (BID) | ORAL | Status: DC
  Administered 2014-11-14 – 2014-11-15 (×3): 40 meq via ORAL
  Filled 2014-11-14 (×3): qty 2

## 2014-11-14 MED ORDER — POTASSIUM CHLORIDE CRYS ER 20 MEQ PO TBCR
40.0000 meq | EXTENDED_RELEASE_TABLET | ORAL | Status: DC
  Administered 2014-11-14: 40 meq via ORAL
  Filled 2014-11-14: qty 2

## 2014-11-14 MED ORDER — ACETAMINOPHEN 325 MG PO TABS: 650.0000 mg | ORAL_TABLET | Freq: Four times a day (QID) | ORAL | Status: DC | PRN

## 2014-11-14 MED ORDER — INSULIN ASPART 100 UNIT/ML ~~LOC~~ SOLN
20.0000 [IU] | Freq: Three times a day (TID) | SUBCUTANEOUS | Status: DC
  Administered 2014-11-15 (×2): 20 [IU] via SUBCUTANEOUS
  Filled 2014-11-14 (×2): qty 20

## 2014-11-14 MED ORDER — INSULIN ASPART 100 UNIT/ML ~~LOC~~ SOLN
0.0000 [IU] | Freq: Every day | SUBCUTANEOUS | Status: DC
  Administered 2014-11-14: 9 [IU] via SUBCUTANEOUS
  Filled 2014-11-14: qty 9

## 2014-11-14 MED ORDER — INSULIN DETEMIR 100 UNIT/ML ~~LOC~~ SOLN
80.0000 [IU] | Freq: Every day | SUBCUTANEOUS | Status: DC
  Administered 2014-11-14: 80 [IU] via SUBCUTANEOUS
  Filled 2014-11-14 (×2): qty 0.8

## 2014-11-14 MED ORDER — METFORMIN HCL 500 MG PO TABS
500.0000 mg | ORAL_TABLET | Freq: Two times a day (BID) | ORAL | Status: DC
  Administered 2014-11-14 – 2014-11-15 (×2): 500 mg via ORAL
  Filled 2014-11-14 (×2): qty 1

## 2014-11-14 MED ORDER — INSULIN ASPART 100 UNIT/ML ~~LOC~~ SOLN: 20.0000 [IU] | Freq: Three times a day (TID) | SUBCUTANEOUS | Status: DC

## 2014-11-14 MED ORDER — ONDANSETRON HCL 4 MG/2ML IJ SOLN
4.0000 mg | INTRAMUSCULAR | Status: DC | PRN
  Administered 2014-11-14 (×3): 4 mg via INTRAVENOUS
  Filled 2014-11-14 (×3): qty 2

## 2014-11-14 MED ORDER — FLUCONAZOLE 100 MG PO TABS
200.0000 mg | ORAL_TABLET | Freq: Once | ORAL | Status: AC
  Administered 2014-11-14: 200 mg via ORAL
  Filled 2014-11-14: qty 2

## 2014-11-14 MED ORDER — INSULIN ASPART 100 UNIT/ML ~~LOC~~ SOLN: 0.0000 [IU] | Freq: Every day | SUBCUTANEOUS | Status: DC

## 2014-11-14 NOTE — Progress Notes (Signed)
Inpatient Diabetes Program Recommendations  AACE/ADA: New Consensus Statement on Inpatient Glycemic Control (2015)  Target Ranges:  Prepandial:   less than 140 mg/dL      Peak postprandial:   less than 180 mg/dL (1-2 hours)      Critically ill patients:  140 - 180 mg/dL   Results for Jennifer [name], Jennifer [name] (MRN [id number]) as of 11/14/2014 12:53  Ref. Range 11/14/2014 07:41 11/14/2014 08:48 11/14/2014 09:41 11/14/2014 10:47 11/14/2014 12:05  Glucose-Capillary Latest Ref Range: 65-99 mg/dL 184 (H) 210 (H) 303 (H) 314 (H) 212 (H)    Met with patient at the bedside- she is seeing Dr. Clide Deutscher at [facility].  Her blood sugars have been so elevated that the MD has been seeing her every Tuesday to make insulin adjustments.  She reports taking Levemir 60 units hs and Novolin R with meals and snacks.  Patient reports taking insulin via insulin pen- she is using a new needle for each administration and prepares the prime each time she takes a dose of insulin.   She is using her left arm and her abdomen for insulin administration- I could not feel lumps under the skin where she has been injecting but have encouraged her to use her abdomen as the site of choice and to rotate the insulin around the entire abdomen for better absorption.  Patient keeps her extra insulin syringes in the fridge and the one she is using, with her at room temperature. Patient also reports taking Metformin bid.   Gentry Fitz, RN, BA, MHA, CDE Diabetes Coordinator Inpatient Diabetes Program  [phone] (Team Pager) [phone] ([place]) 11/14/2014 12:55 PM

## 2014-11-14 NOTE — Care Management Note (Signed)
Case Management Note  Patient Details  Name: Jennifer [name] MRN: [id number] Date of Birth: February 26, 1980  Subjective/Objective:    Admitted with hyperglycemia and blood sugars around 1300. Met with patient and Almyra Free, diabetes coordinator at bedside. Patient lives at home with her spouse and children. She works full time and drives. She is active with the [facility] and is going every Tuesday to see Dr. Karn Cassis for management of her diabetes. She gets her medications at [name] and denies issues paying the copays. Denies issues with transportation.  Case Closed.             Action/Plan:  Follow up with [name] Expected Discharge Date:                  Expected Discharge Plan:  Home/Self Care  In-House Referral:     Discharge planning Services  CM Consult  Post Acute Care Choice:    Choice offered to:     DME Arranged:    DME Agency:     HH Arranged:    [place] Agency:     Status of Service:  Case Closed  Medicare Important Message Given:    Date Medicare IM Given:    Medicare IM give by:    Date Additional Medicare IM Given:    Additional Medicare Important Message give by:     If discussed at [place] of Stay Meetings, dates discussed:    Additional Comments:  Jolly Mango, RN 11/14/2014, 11:12 AM

## 2014-11-14 NOTE — Progress Notes (Signed)
[facility] [place] NAME: Jennifer [name]    MR#:  [id number]  DATE OF BIRTH:  12/11/1980  SUBJECTIVE:  CHIEF COMPLAINT:   Chief Complaint  Patient presents with  . Hyperglycemia   Admitted for [facility] Has had elevated blood sugars for many weeks  Was on Insulin 70/30 60 Units BID till 11/07/2014 when she was changed to Lantus 60 units daily and 15 units Aspart pre-meal TID at [name].  She still has some nausea and weakness today.  ON insulin drip REVIEW OF SYSTEMS:    Review of Systems  Constitutional: Positive for malaise/fatigue. Negative for fever and chills.  HENT: Negative for sore throat.   Eyes: Negative for blurred vision, double vision and pain.  Respiratory: Negative for cough, hemoptysis, shortness of breath and wheezing.   Cardiovascular: Negative for chest pain, palpitations, orthopnea and leg swelling.  Gastrointestinal: Positive for nausea. Negative for heartburn, vomiting, abdominal pain, diarrhea and constipation.  Genitourinary: Negative for dysuria and hematuria.  Musculoskeletal: Negative for back pain and joint pain.  Skin: Negative for rash.  Neurological: Negative for sensory change, speech change, focal weakness and headaches.  Endo/Heme/Allergies: Does not bruise/bleed easily.  Psychiatric/Behavioral: Negative for depression. The patient is not nervous/anxious.       DRUG ALLERGIES:   Allergies  Allergen Reactions  . Demerol [Meperidine] Hives, Itching and Nausea And Vomiting  . Oxycodone Hives, Itching and Nausea And Vomiting  . Toradol [Ketorolac Tromethamine] Hives, Itching and Nausea And Vomiting    VITALS:  Blood pressure 129/76, pulse 75, temperature 98.5 F (36.9 C), temperature source Oral, resp. rate 15, height 5\' 6"  (1.676 m), weight 85.7 kg (188 lb 15 oz), last menstrual period 11/06/2014, SpO2 100 %.  PHYSICAL EXAMINATION:   Physical Exam  GENERAL:  34 y.o.-year-old  patient lying in the bed with no acute distress.  EYES: Pupils equal, round, reactive to light and accommodation. No scleral icterus. Extraocular muscles intact.  HEENT: Head atraumatic, normocephalic. Oropharynx and nasopharynx clear.  NECK:  Supple, no jugular venous distention. No thyroid enlargement, no tenderness.  LUNGS: Normal breath sounds bilaterally, no wheezing, rales, rhonchi. No use of accessory muscles of respiration.  CARDIOVASCULAR: S1, S2 normal. No murmurs, rubs, or gallops.  ABDOMEN: Soft, nontender, nondistended. Bowel sounds present. No organomegaly or mass.  EXTREMITIES: No cyanosis, clubbing or edema b/l.    NEUROLOGIC: Cranial nerves II through XII are intact. No focal Motor or sensory deficits b/l.   PSYCHIATRIC: The patient is alert and oriented x 3.  SKIN: No obvious rash, lesion, or ulcer.    LABORATORY PANEL:   CBC  Recent Labs Lab 11/14/14 0022  WBC 10.7  HGB 10.6*  HCT 33.9*  PLT 295   ------------------------------------------------------------------------------------------------------------------  Chemistries   Recent Labs Lab 11/14/14 1257  NA 131*  K 3.9  CL 105  CO2 22  GLUCOSE 318*  BUN 15  CREATININE 0.74  CALCIUM 8.6*   ------------------------------------------------------------------------------------------------------------------  Cardiac Enzymes No results for input(s): TROPONINI in the last 168 hours. ------------------------------------------------------------------------------------------------------------------  RADIOLOGY:  No results found.   ASSESSMENT AND PLAN:   Patient is a 34 year old African-American female with type 2 diabetes with severe hyperglycemia  1. Non-Ketotic Hyperosmolar Hyperglycemic state Continue Insulin drip. Blood sugars are back to 400. Make patient NPO. Q1 hr accuchecks Consult endocrinology. Change to Lantus when BS improved  2. Severe dehydration due to hyperglycemia ON IVF  3.  Pseudohyponatremia Resolved  3. Acute kidney  injury due to dehydration we'll give her IV fluids   All the records are reviewed and case discussed with Care Management/Social Workerr. Management plans discussed with the patient, family and they are in agreement.  CODE STATUS: FULL  DVT Prophylaxis: SCDs  TOTAL CC TIME TAKING CARE OF THIS PATIENT: 35 minutes.   POSSIBLE D/C IN 1-2 DAYS, DEPENDING ON CLINICAL CONDITION.   Hillary Bow R M.D on 11/14/2014 at 2:34 PM  Between 7am to 6pm - Pager - [phone]  After 6pm go to [email or web address] - password [email or web address] [facility]  Office  [phone]  CC: Primary care physician; PROVIDER NOT IN SYSTEM    Note: This dictation was prepared with Dragon dictation along with smaller phrase technology. Any transcriptional errors that result from this process are unintentional.

## 2014-11-14 NOTE — Progress Notes (Signed)
Wrong insulin drip in use (icu glycemic control), restarted DKA drip with original multipliar. BS still elevated secondary to no carb coverage

## 2014-11-14 NOTE — Progress Notes (Addendum)
  Inpatient Diabetes Program Recommendations  AACE/ADA: New Consensus Statement on Inpatient Glycemic Control (2015)  Target Ranges:  Prepandial:   less than 140 mg/dL      Peak postprandial:   less than 180 mg/dL (1-2 hours)      Critically ill patients:  140 - 180 mg/dL   Review of Glycemic Control  Diabetes history: Type 2 Outpatient Diabetes medications: Levemir  60 units qday, Humulin N 15 units per meal, Glucophage 1000mg  bid Current orders for Inpatient glycemic control: IV insulin using the DKA order set   Results for Jennifer [name], Jennifer [name] (MRN [id number]) as of 11/14/2014 06:57  Ref. Range 11/14/2014 00:22  CO2 Latest Ref Range: 22-32 mmol/L 20 (L)   Results for Jennifer [name], Jennifer [name] (MRN [id number]) as of 11/14/2014 06:57  Ref. Range 11/14/2014 00:22  Anion gap Latest Ref Range: 5-15  11    Please continue on insulin infusion until the patient is seen by endocrinology (for insulin transition orders).    Inpatient Diabetes Program Recommendations: per the ICU glycemic Control Order set:   Patient is currently on the DKA order set.  Per protocol, patient should not receive Lantus insulin until blood sugars are consistently between 140-180mg /dl for 4 consecutive hours.  Transition to SQ insulin-MD will initiate Phase 2 of DKA Protocol for transition off insulin drip.  RN discontinue insulin drip 2 hours after subcutaneous basal insulin given and simultaneously give Novolog correction scale.   While on IV insulin, if patient is eating cover carbohydrate (CHO) intake using IV insulin/CHO coverage via Glucostabilizer (1 unit for every 10 grams of CHO)  Gentry Fitz, RN, [place], [place], CDE Diabetes Coordinator Inpatient Diabetes Program  [phone] (Team Pager) [phone] ([place]) 11/14/2014 7:16 AM

## 2014-11-14 NOTE — Progress Notes (Signed)
Initial Nutrition Assessment    INTERVENTION:   Meals and Snacks: Cater to patient preferences Education: provided written education on diabetic diet; pt reports she understands diabetic diet and has no questions at this time   NUTRITION DIAGNOSIS:   No nutrition diagnosis at this time  GOAL:   Patient will meet greater than or equal to 90% of their needs   MONITOR:    (Energy Intake, Digestive System, Electrolyte/Renal Profile, Glucose Profile)  REASON FOR ASSESSMENT:   Malnutrition Screening Tool    ASSESSMENT:    Pt admitted with severe hyperglycemia, dehydration; complains of severe headache on visit this AM, asking for medicine, notified Katie RN  Past Medical History  Diagnosis Date  . Diabetes mellitus without complication (Jennifer [name])   . Hypertension     Diet Order:  Diet Carb Modified Fluid consistency:: Thin; Room service appropriate?: Yes   Energy Intake: pt ate 50% at breakfast, reports appetite is good and has been prior to admission  Electrolyte and Renal Profile:  Recent Labs Lab 11/14/14 0022 11/14/14 0449 11/14/14 0931  BUN 15 13 13   CREATININE 0.81 0.66 0.69  NA 132* 135 134*  K 3.6 3.3* 3.2*   Glucose Profile:  Recent Labs  11/14/14 0941 11/14/14 1047 11/14/14 1205  GLUCAP 303* 314* 212*   Meds: NS at 150 ml.hr, insulin drip  Height:   Ht Readings from Last 1 Encounters:  11/13/14 5\' 6"  (1.676 m)    Weight: per weight encounters, weight has been stable  Wt Readings from Last 1 Encounters:  11/13/14 188 lb 15 oz (85.7 kg)    Wt Readings from Last 10 Encounters:  11/13/14 188 lb 15 oz (85.7 kg)  10/29/14 187 lb (84.823 kg)  10/12/14 187 lb (84.823 kg)  10/09/14 187 lb (84.823 kg)  10/04/14 187 lb (84.823 kg)    BMI:  Body mass index is 30.51 kg/(m^2).  HIGH Care Level  [facility] MS, [place], LDN [phone] Pager

## 2014-11-15 LAB — GLUCOSE, CAPILLARY
GLUCOSE-CAPILLARY: 327 mg/dL — AB (ref 65–99)
Glucose-Capillary: 310 mg/dL — ABNORMAL HIGH (ref 65–99)

## 2014-11-15 MED ORDER — INSULIN DETEMIR 100 UNIT/ML ~~LOC~~ SOLN: 80.0000 [IU] | Freq: Every day | SUBCUTANEOUS | Status: DC

## 2014-11-15 MED ORDER — METFORMIN HCL 1000 MG PO TABS
500.0000 mg | ORAL_TABLET | Freq: Two times a day (BID) | ORAL | Status: DC
Start: 2014-11-15 — End: 2015-10-25

## 2014-11-15 MED ORDER — INSULIN ASPART 100 UNIT/ML ~~LOC~~ SOLN: 20.0000 [IU] | Freq: Three times a day (TID) | SUBCUTANEOUS | Status: DC

## 2014-11-15 MED ORDER — FAMOTIDINE 20 MG PO TABS
40.0000 mg | ORAL_TABLET | Freq: Once | ORAL | Status: AC
  Administered 2014-11-15: 40 mg via ORAL
  Filled 2014-11-15: qty 2

## 2014-11-15 NOTE — Discharge Instructions (Addendum)
DIET:  Diabetic diet  DISCHARGE CONDITION:  Stable  ACTIVITY:  Activity as tolerated  OXYGEN:  Home Oxygen: No.   Oxygen Delivery: room air  DISCHARGE LOCATION:  home   If you experience worsening of your admission symptoms, develop shortness of breath, life threatening emergency, suicidal or homicidal thoughts you must seek medical attention immediately by calling 911 or calling your MD immediately  if symptoms less severe.  You Must read complete instructions/literature along with all the possible adverse reactions/side effects for all the Medicines you take and that have been prescribed to you. Take any new Medicines after you have completely understood and accpet all the possible adverse reactions/side effects.   Please note  You were cared for by a hospitalist during your hospital stay. If you have any questions about your discharge medications or the care you received while you were in the hospital after you are discharged, you can call the unit and asked to speak with the hospitalist on call if the hospitalist that took care of you is not available. Once you are discharged, your primary care physician will handle any further medical issues. Please note that NO REFILLS for any discharge medications will be authorized once you are discharged, as it is imperative that you return to your primary care physician (or establish a relationship with a primary care physician if you do not have one) for your aftercare needs so that they can reassess your need for medications and monitor your lab values.    Blood Glucose Monitoring, Adult Monitoring your blood glucose (also know as blood sugar) helps you to manage your diabetes. It also helps you and your health care provider monitor your diabetes and determine how well your treatment plan is working. WHY SHOULD YOU MONITOR YOUR BLOOD GLUCOSE?  It can help you understand how food, exercise, and medicine affect your blood glucose.  It  allows you to know what your blood glucose is at any given moment. You can quickly tell if you are having low blood glucose (hypoglycemia) or high blood glucose (hyperglycemia).  It can help you and your health care provider know how to adjust your medicines.  It can help you understand how to manage an illness or adjust medicine for exercise. WHEN SHOULD YOU TEST? Your health care provider will help you decide how often you should check your blood glucose. This may depend on the type of diabetes you have, your diabetes control, or the types of medicines you are taking. Be sure to write down all of your blood glucose readings so that this information can be reviewed with your health care provider. See below for examples of testing times that your health care provider may suggest. Type 1 Diabetes  Test at least 2 times per day if your diabetes is well controlled, if you are using an insulin pump, or if you perform multiple daily injections.  If your diabetes is not well controlled or if you are sick, you may need to test more often.  It is a good idea to also test:  Before every insulin injection.  Before and after exercise.  Between meals and 2 hours after a meal.  Occasionally between 2:00 a.m. and 3:00 a.m. Type 2 Diabetes  If you are taking insulin, test at least 2 times per day. However, it is best to test before every insulin injection.  If you take medicines by mouth (orally), test 2 times a day.  If you are on a controlled diet, test once  a day.  If your diabetes is not well controlled or if you are sick, you may need to monitor more often. HOW TO MONITOR YOUR BLOOD GLUCOSE Supplies Needed  Blood glucose meter.  Test strips for your meter. Each meter has its own strips. You must use the strips that go with your own meter.  A pricking needle (lancet).  A device that holds the lancet (lancing device).  A journal or log book to write down your results. Procedure  Wash  your hands with soap and water. Alcohol is not preferred.  Prick the side of your finger (not the tip) with the lancet.  Gently milk the finger until a small drop of blood appears.  Follow the instructions that come with your meter for inserting the test strip, applying blood to the strip, and using your blood glucose meter. Other Areas to Get Blood for Testing Some meters allow you to use other areas of your body (other than your finger) to test your blood. These areas are called alternative sites. The most common alternative sites are:  The forearm.  The thigh.  The back area of the lower leg.  The palm of the hand. The blood flow in these areas is slower. Therefore, the blood glucose values you get may be delayed, and the numbers are different from what you would get from your fingers. Do not use alternative sites if you think you are having hypoglycemia. Your reading will not be accurate. Always use a finger if you are having hypoglycemia. Also, if you cannot feel your lows (hypoglycemia unawareness), always use your fingers for your blood glucose checks. ADDITIONAL TIPS FOR GLUCOSE MONITORING  Do not reuse lancets.  Always carry your supplies with you.  All blood glucose meters have a 24-hour "hotline" number to call if you have questions or need help.  Adjust (calibrate) your blood glucose meter with a control solution after finishing a few boxes of strips. BLOOD GLUCOSE RECORD KEEPING It is a good idea to keep a daily record or log of your blood glucose readings. Most glucose meters, if not all, keep your glucose records stored in the meter. Some meters come with the ability to download your records to your home computer. Keeping a record of your blood glucose readings is especially helpful if you are wanting to look for patterns. Make notes to go along with the blood glucose readings because you might forget what happened at that exact time. Keeping good records helps you and your  health care provider to work together to achieve good diabetes management.    This information is not intended to replace advice given to you by your health care provider. Make sure you discuss any questions you have with your health care provider.   Document Released: 01/23/2003 Document Revised: 02/10/2014 Document Reviewed: 06/14/2012 Elsevier Interactive Patient Education [employer].  Your health care provider has decided you need to take insulin regularly. You have been given a correction scale (also called a sliding scale) in case you need extra insulin when your blood sugar is too high (hyperglycemia). The following instructions will assist you in how to use that correction scale.  WHAT IS A CORRECTION SCALE?  When you check your blood sugar, sometimes it will be higher than your health care provider has told you it should be. You may need an extra dose of insulin to bring your blood sugar to the recommended level (also known as your goal, target, or normal level). The correction  scale is prescribed by your health care provider based on your specific needs.  Your correction scale has two parts:   The first shows you a blood sugar range.   The second part tells you how much extra insulin to give yourself if your blood sugar falls within this range. You will not need an extra dose of insulin if your blood glucose is in the desired range. You should simply give yourself the normal amount of insulin that your health care provider has ordered for you.  WHY IS IT IMPORTANT TO KEEP YOUR BLOOD SUGAR LEVELS AT YOUR DESIRED LEVEL?  Keeping your blood sugar at the desired level helps to prevent long-term complications of diabetes, such as eye disease, kidney failure, nerve damage, and other serious complications. WHAT TYPE OF INSULIN WILL YOU USE?  To help bring down blood sugar levels that are too high, your health care provider will prescribe a short-acting or a rapid-acting  insulin. An example of a short-acting insulin would be regular insulin. Remember, you may also have a longer-acting insulin prescribed for you.  WHAT DO YOU NEED TO DO?   Check your blood sugar with your home blood glucose meter as recommended by your health care provider.   Using your correction scale, find the range that your blood sugar lies in.   Look for the units of insulin that match that blood sugar range. Give yourself the dose of correction insulin your health care provider has prescribed. Always make sure you are using the right type of insulin.   Prior to the injection, make sure you have food available that you can eat in the next 15-30 minutes.   If your correction insulin is rapid acting, start eating your meal within 15 minutes after you have given yourself the insulin injection. If you wait longer than 15 minutes to eat, your blood sugar might get too low.   If your correction insulin is short acting(regular), start eating your meal within 30 minutes after you have given yourself the insulin injection. If you wait longer than 30 minutes to eat, your blood sugar might get too low. Symptoms of low blood sugar (hypoglycemia) may include feeling shaky or weak, sweating, feeling confused, difficulty seeing, agitation, crankiness, or numbness of the lips or tongue. Check your blood sugar immediately and treat your results as directed by your health care provider.   Keep a log of your blood sugar results with the time you took the test and the amount of insulin that you injected. This information will help your health care provider manage your medicines.   Note on your log anything that may affect your blood sugar level, such as:   Changes in normal exercise or activity.   Changes in your normal schedule, such as staying up late, going on vacation, changing your diet, or holidays.   New medicines. This includes prescription and over-the-counter medicines. Some medicines  may cause high blood sugar.   Sickness, stress, or anxiety.   Changes in the time you took your medicine.   Changes in your meals, such as skipping a meal, having a late meal, or dining out.   Eating things that may affect blood glucose, such as snacks, meal portions that are larger than normal, drinks with sugar, or eating less than usual.   Ask your health care provider any questions you have.  Be aware of "stacking" your insulin doses. This happens when you correct a high blood sugar level by giving yourself extra insulin  too soon after a previous correction dose or mealtime dose. You may then have too much insulin still active in your body and may be at risk for hypoglycemia. WHY DO YOU NEED A CORRECTION SCALE IF YOU HAVE NEVER BEEN DIAGNOSED WITH DIABETES?   Keeping your blood glucose in the target range is important for your overall health.   You may have been prescribed medicines that cause your blood glucose to be higher than normal. WHEN SHOULD YOU SEEK MEDICAL CARE? Contact your health care provider if:   You have experienced hypoglycemia that you are unable to treat with your usual routine.   You have a high blood sugar level that is not coming down with the correction dose.  Your blood sugar is often too low or does not come up even if you eat a fast-acting carbohydrate. Someone who lives with you should seek immediate medical care if you become unresponsive.   This information is not intended to replace advice given to you by your health care provider. Make sure you discuss any questions you have with your health care provider.   Document Released: 06/13/2010 Document Revised: 09/22/2012 Document Reviewed: 07/02/2012 Elsevier Interactive Patient Education 2016 [employer].  Diabetes and Sick Day Management Blood sugar (glucose) can be more difficult to control when you are sick. Colds, fever, flu, nausea, vomiting, and diarrhea are all examples of common  illnesses that can cause problems for people with diabetes. Loss of body fluids (dehydration) from fever, vomiting, diarrhea, infection, and the stress of a sickness can all cause blood glucose levels to increase. Because of this, it is very important to take your diabetes medicines and to eat some form of carbohydrate food when you are sick. Liquid or soft foods are often tolerated, and they help to replace fluids. HOME CARE INSTRUCTIONS These main guidelines are intended for managing a short-term (24 hours or less) sickness:  Take your usual dose of insulin or oral diabetes medicine. An exception would be if you take any form of metformin. If you cannot eat or drink, you can become dehydrated and should not take this medicine.  Continue to take your insulin even if you are unable to eat solid foods or are vomiting. Your insulin dose may stay the same, or it may need to be increased when you are sick.  You will need to test your blood glucose more often, generally every 2-4 hours. If you have type 1 diabetes, test your urine for ketones every 4 hours. If you have type 2 diabetes, test your urine for ketones as directed by your health care provider.  Eat some form of food that contains carbohydrates. The carbohydrates can be in solid or liquid form. You should eat 45-50 g of carbohydrates every 3-4 hours.  Replace fluids if you have a fever, vomit, or have diarrhea. Ask your health care provider for specific rehydration instructions.  Watch carefully for the signs of ketoacidosis if you have type 1 diabetes. Call your health care provider if any of the following symptoms are present, especially in children:  Moderate to large ketones in the urine along with a high blood glucose level.  Severe nausea.  Vomiting.  Diarrhea.  Abdominal pain.  Rapid breathing.  Drink extra liquids that do not contain sugar such as water.  Be careful with over-the-counter medicines. Read the labels. They may  contain sugar or types of sugars that can increase your blood glucose level. Food Choices for Illness All of the food choices  below contain about 15 g of carbohydrates. Plan ahead and keep some of these foods around.    to  cup carbonated beverage containing sugar. Carbonated beverages will usually be better tolerated if they are opened and left at room temperature for a few minutes.   of a twin frozen ice pop.   cup regular gelatin.   cup juice.   cup ice cream or frozen yogurt.   cup cooked cereal.   cup sherbet.  1 cup clear broth or soup.  1 cup cream soup.   cup regular custard.   cup regular pudding.  1 cup sports drink.  1 cup plain yogurt.  1 slice toast.  6 squares saltine crackers.  5 vanilla wafers. SEEK MEDICAL CARE IF:   You are unable to drink fluids, even small amounts.  You have nausea and vomiting for more than 6 hours.  You have diarrhea for more than 6 hours.  Your blood glucose level is more than 240 mg/dL, even with additional insulin.  There is a change in mental status.  You develop an additional serious sickness.  You have been sick for 2 days and are not getting better.  You have a fever. SEEK IMMEDIATE MEDICAL CARE IF:  You have difficulty breathing.  You have moderate to large ketone levels. MAKE SURE YOU:  Understand these instructions.  Will watch your condition.  Will get help right away if you are not doing well or get worse.   This information is not intended to replace advice given to you by your health care provider. Make sure you discuss any questions you have with your health care provider.   Document Released: 01/23/2003 Document Revised: 02/10/2014 Document Reviewed: 06/29/2012 Elsevier Interactive Patient Education 2016 [employer]  Type 2 Diabetes Mellitus, Adult Type 2 diabetes mellitus is a long-term (chronic) disease. In type 2 diabetes:  The pancreas does not make enough of a hormone  called insulin.  The cells in the body do not respond as well to the insulin that is made.  Both of the above can happen. Normally, insulin moves sugars from food into tissue cells. This gives you energy. If you have type 2 diabetes, sugars cannot be moved into tissue cells. This causes high blood sugar (hyperglycemia).  Your doctors will set personal treatment goals for you based on your age, your medicines, how long you have had diabetes, and any other medical conditions you have. Generally, the goal of treatment is to maintain the following blood glucose levels:  Before meals (preprandial): 80-130 mg/dL.  After meals (postprandial): below 180 mg/dL.  A1c: less than 6.5-7%. HOME CARE  Have your hemoglobin A1c level checked twice a year. The level shows if your diabetes is under control or out of control.  Test your blood sugar level every day as told by your doctor.  Check your ketone levels by testing your pee (urine) when you are sick and as told.  Take your diabetes or insulin medicine as told by your doctor.  Never run out of insulin.  Adjust how much insulin you give yourself based on how many carbs (carbohydrates) you eat. Carbs are in many foods, such as fruits, vegetables, whole grains, and dairy products.  Have a healthy snack between every healthy meal. Have 3 meals and 3 snacks a day.  Lose weight if you are overweight.  Carry a medical alert card or wear your medical alert jewelry.  Carry a 15-gram carb snack with you at all times. Examples  include:  Glucose pills, 3 or 4.  Glucose gel, 15-gram tube.  Raisins, 2 tablespoons (24 grams).  Jelly beans, 6.  Animal crackers, 8.  Regular (not diet) pop, 4 ounces (120 milliliters).  Gummy treats, 9.  Notice low blood sugar (hypoglycemia) symptoms, such as:  Shaking (tremors).  Trouble thinking clearly.  Sweating.  Faster heart rate.  Headache.  Dry mouth.  Hunger.  Crabbiness  (irritability).  Being worried or tense (anxious).  Restless sleep.  A change in speech or coordination.  Confusion.  Treat low blood sugar right away. If you are alert and can swallow, follow the 15:15 rule:  Take 15-20 grams of a rapid-acting glucose or carb. This includes glucose gel, glucose pills, or 4 ounces (120 milliliters) of fruit juice, regular pop, or low-fat milk.  Check your blood sugar level 15 minutes after taking the glucose.  Take 15-20 grams more of glucose if the repeat blood sugar level is still 70 mg/dL (milligrams/deciliter) or below.  Eat a meal or snack within 1 hour of the blood sugar levels going back to normal.  Notice early symptoms of high blood sugar, such as:  Being really thirsty or drinking a lot (polydipsia).  Peeing a lot (polyuria).  Do at least 150 minutes of physical activity a week or as told.  Split the 150 minutes of activity up during the week. Do not do 150 minutes of activity in one day.  Perform exercises, such as weight lifting, at least 2 times a week or as told.  Spend no more than 90 minutes at one time inactive.  Adjust your insulin or food intake as needed if you start a new exercise or sport.  Follow your sick-day plan when you are not able to eat or drink as usual.  Do not smoke, chew tobacco, or use electronic cigarettes.  Women who are not pregnant should drink no more than 1 drink a day. Men should drink no more than 2 drinks a day.  Only drink alcohol with food.  Ask your doctor if alcohol is safe for you.  Tell your doctor if you drink alcohol several times during the week.  See your doctor regularly.  Schedule an eye exam soon after you are told you have diabetes. Schedule exams once every year.  Check your skin and feet every day. Check for cuts, bruises, redness, nail problems, bleeding, blisters, or sores. A doctor should do a foot exam once a year.  Brush your teeth and gums twice a day. Floss once a  day. Visit your dentist regularly.  Share your diabetes plan with your workplace or school.  Keep your shots that fight diseases (vaccines) up to date.  Get a flu (influenza) shot every year.  Get a pneumonia shot. If you are 45 years of age or older and you have never gotten a pneumonia shot, you might need to get two shots.  Ask your doctor which other shots you should get.  Learn how to deal with stress.  Get diabetes education and support as needed.  Ask your doctor for special help if:  You need help to maintain or improve how you do things on your own.  You need help to maintain or improve the quality of your life.  You have foot or hand problems.  You have trouble cleaning yourself, dressing, eating, or doing physical activity. GET HELP IF:  You are unable to eat or drink for more than 6 hours.  You feel sick to your  stomach (nauseous) or throw up (vomit) for more than 6 hours.  Your blood sugar level is over 240 mg/dL.  There is a change in mental status.  You get another serious illness.  You have watery poop (diarrhea) for more than 6 hours.  You have been sick or have had a fever for 2 or more days and are not getting better.  You have pain when you are active. GET HELP RIGHT AWAY IF:  You have trouble breathing.  Your ketone levels are higher than your doctor says they should be. MAKE SURE YOU:  Understand these instructions.  Will watch your condition.  Will get help right away if you are not doing well or get worse.   This information is not intended to replace advice given to you by your health care provider. Make sure you discuss any questions you have with your health care provider.   Document Released: 10/30/2007 Document Revised: 06/06/2014 Document Reviewed: 08/22/2011 Elsevier Interactive Patient Education [employer].

## 2014-11-15 NOTE — Progress Notes (Signed)
RN gave report to Junie Panning, RN on 1C. Patient to go to room 113. Patient informed and will call family herself.

## 2014-11-15 NOTE — Progress Notes (Signed)
Jennifer [name] is a 34 y.o. female seen in follow up for uncontrolled diabetes.  *She was seen yesterday and consult note was entered in chart, however note no longer found. Will work with IT to find that note*  Yesterday late afternoon, she was given Levemir 80 units and metformin 500 mg one time then IV insulin stopped after 2 hrs. Fasting sugar today is 331. Regimen now includes: NovoLog 20 units tid AC + SSI, Levemir 80 units qhs, and metformin 500 mg in AM and 1000 mg in PM. She denies: cough/N/V/abd pain/dysuria. No fevers or chills.   Medical history Past Medical History  Diagnosis Date  . Diabetes mellitus without complication ([place])   . Hypertension      Surgical history Past Surgical History  Procedure Laterality Date  . None       Medications . enoxaparin (LOVENOX) injection  40 mg Subcutaneous Q24H  . insulin aspart  0-10 Units Subcutaneous QHS  . insulin aspart  0-20 Units Subcutaneous TID WC  . insulin aspart  20 Units Subcutaneous TID WC  . insulin detemir  80 Units Subcutaneous Daily  . metFORMIN  500 mg Oral BID WC  . potassium chloride  40 mEq Oral BID     Social history Social History  Substance Use Topics  . Smoking status: Former Smoker -- 0.00 packs/day  . Smokeless tobacco: None  . Alcohol Use: No     Family history Family History  Problem Relation Age of Onset  . Diabetes    . Hypertension      Physical Exam BP 117/81 mmHg  Pulse 72  Temp(Src) 98.1 F (36.7 C) (Oral)  Resp 18  Ht 5\' 6"  (1.676 m)  Wt 85.7 kg (188 lb 15 oz)  BMI 30.51 kg/m2  SpO2 100%  LMP 11/06/2014 (Approximate)  GEN: Obese AA  female, in NAD. PSYC: alert and oriented, good insight  Assessment Uncontrolled type 2 diabetes Long-term use of Insulin Overweight (BMI 30) Tobacco dependence  Plan While sugar remains high, she has only had one dose of the higher dose Levemir and one dose of metformin. Too early to assess if we need to adjust insulin  further.  Advised patient to check sugars qACHS. Bring meter to office visit. See me in follow up on Fri 10/14 at 2 PM. She was agreeable.  If primary hospitalist in agreement, would be reasonable to DC home today on regimen of: Levemir 80 units qhs Regular insulin 30 units tid AC + same correction sliding scale as currently ordered Metformin 500 mg in AM and 1000 mg in PM. Lisinopril 10 mg daily  She has all medications listed above, but note that these will be dose changes for her.

## 2014-11-15 NOTE — Progress Notes (Signed)
Patient was transitioned of IV insulin drip to SQ insulin yesterday evening. Patient was given Levemir 80 units at 18:10 on 11/14/14.  Currently patient is ordered: Levemir 80 units daily, Novolog 20 units TID with meals for meal coverage, Novolog 0-20 units TID with meals, Novolog 0-5 units HS, and Metformin 500 mg BID. Fasting glucose is 310 mg/dl this morning. Spoke with patient over the phone and inquired about whether glucose this morning was indeed a fasting glucose. Patient confirms that she had not had anything to eat or drink this morning prior to glucose being checked. Patient states that Dr. Gabriel Carina seen her yesterday evening. Anticipate Dr. Gabriel Carina will follow patient daily and make necessary insulin dose changes. Will continue to follow along.  Thanks, Barnie Alderman, RN, MSN, CCRN, CDE Diabetes Coordinator Inpatient Diabetes Program [phone] (Team Pager from [place] to [place]) [phone] ([place] office) [phone] [facility] office) [phone] [facility] office)

## 2014-11-15 NOTE — Progress Notes (Addendum)
Pt discharged home per MD order. IVs removed. Discharge instructions given to patient. Activity, follow up care, diet and diabetes education given to patient. All questions answered, pt verbalized understanding. Pt left via wheelchair with family and auxiliary.

## 2014-11-17 NOTE — Consult Note (Signed)
ENDOCRINOLOGY CONSULTATION  REFERRING PHYSICIAN: Hillary Bow, MD CONSULTING PHYSICIAN:  A. Lavone Orn, MD.  CHIEF COMPLAINT:  Diabetic mellitus  HISTORY OF PRESENT ILLNESS:  34 y.o. female seen in consultation for diabetes mellitus who was admitted yesterday with severe hyperglycemia, with initial BG of 1345. She was started on IV insulin and IV fluids. Sugars improved greatly. IV dextrose has been added and she was given a diet today, including breakfast and lunch. Sugars have been elevated in the 212-310 range since lunch.   She gives a h/o UTI 2 weeks prior, which was treated with 2 courses of antibiotic. Since then, no further dysuria or F/C. No flank pain. Sugars high on home meter x2 weeks, despite taking medications. Home regimen includes Levemir 60 units daily , Regular insulin 15 units tid qAC, metformin 500 mg in AM and 1000 mg in PM. She has some nausea, no emesis. Appetite is good.   She has a long h/o diabetes. States she has taken insulin since childhood. She follows with Dr. Clide Deutscher at [facility]. Denies known complications from diabetes. Has had several ER visits for hyperglycemia. No prior hospitalizations for diabetes.    PAST MEDICAL HISTORY:  Past Medical History  Diagnosis Date  . Diabetes mellitus without complication ([place])   . Hypertension    HOME MEDICATIONS: Levemir 60 units daily  Regular insulin 15 units tid qAC metformin 500 mg in AM and 1000 mg in PM Lisinopril 10 mg daily  SOCIAL HISTORY:  Social History  Substance Use Topics  . Smoking status: Former Smoker -- 0.00 packs/day  . Smokeless tobacco: None  . Alcohol Use: No     FAMILY HISTORY:   Family History  Problem Relation Age of Onset  . Diabetes    . Hypertension       ALLERGIES:  Allergies  Allergen Reactions  . Demerol [Meperidine] Hives, Itching and Nausea And Vomiting  . Oxycodone Hives, Itching and Nausea And Vomiting  . Toradol [Ketorolac Tromethamine] Hives,  Itching and Nausea And Vomiting    REVIEW OF SYSTEMS:  GENERAL:  No weight loss.  No fever.  HEENT:  No blurred vision. No sore throat.  NECK:  No neck pain or dysphagia.  CARDIAC:  No chest pain or palpitation.  PULMONARY:  No cough or shortness of breath.  ABDOMEN:  No abdominal pain.  No constipation. EXTREMITIES:  No lower extremity swelling.  ENDOCRINE:  No heat or cold intolerance.  GENITOURINARY:  No dysuria or hematuria. SKIN:  No recent rash or skin changes.   PHYSICAL EXAMINATION:  BP 117/81 mmHg  Pulse 72  Temp(Src) 98.1 F (36.7 C) (Oral)  Resp 18  Ht 5\' 6"  (1.676 m)  Wt 85.7 kg (188 lb 15 oz)  BMI 30.51 kg/m2  SpO2 100%  LMP 11/06/2014 (Approximate)  GENERAL:  Well-developed AA female in NAD. HEENT:  EOMI.  Oropharynx is clear.  NECK:  Supple.  No thyromegaly.  No neck tenderness.  CARDIAC:  Regular rate and rhythm without murmur.  PULMONARY:  Clear to auscultation bilaterally.  ABDOMEN:  Diffusely soft, nontender, nondistended.  EXTREMITIES:  No peripheral edema is present.    SKIN:  No rash or dermatopathy. NEUROLOGIC:  No dysarthria.  No tremor. No sensory/motor deficits. PSYCHIATRIC:  Alert and oriented, calm, cooperative.   LABORATORY DATA:  Reviewed.  10/04/14-- Hgb A1c 9.4%  ASSESSMENT:  1. Diabetes mellitus, type 2 uncontrolled 2.   Tobacco dependence  PLAN: 1. Likely with type 2 diabetes, despite h/o onset  in childhood. Will transition to SQ insulin. Will start Levemir 80 units, NovoLog 10 units tid AC initially + SSI, and restart metformin.  2. Stop IV glucose. 3. Stop IV insulin 2 hrs after Levemir is given. 4.. Will aim to keep sugars in the 100-180 range. 5. Consider restarting lisinopril tomorrow 6. Keep NPO for now. Anticipate starting diet tomorrow AM.  Will follow along with you.

## 2014-11-20 NOTE — Discharge Summary (Signed)
[place] [facility] [place] NAME: Jennifer [name]    MR#:  [id number]  DATE OF BIRTH:  1980/10/23  DATE OF ADMISSION:  11/13/2014 ADMITTING PHYSICIAN: Hillary Bow, MD  DATE OF DISCHARGE: 11/15/2014  1:00 PM  PRIMARY CARE PHYSICIAN: PROVIDER NOT IN SYSTEM    ADMISSION DIAGNOSIS:  Hyperglycemic hyperosmolar nonketotic coma ([place]) [E11.01]  DISCHARGE DIAGNOSIS:  Active Problems:   Hyperglycemia   SECONDARY DIAGNOSIS:   Past Medical History  Diagnosis Date  . Diabetes mellitus without complication ([place])   . Hypertension      ADMITTING HISTORY  HISTORY OF PRESENT ILLNESS: Jennifer [name] is a 34 y.o. female with a known history of diabetes2 and hypertension who states that her blood sugars were running in the 400s to 500s for long period of time. Patient Reports that her sugars over the past few days there were so high that unable to register on her glucose meter. Patient's blood sugars were noted to be 1300 here. She is started on IV fluids and started on insulin drip.    HOSPITAL COURSE:   Patient admitted to ICU on insulin drip for her Non ketotic hyperglycemic state wih aggressive fluid resuscitation. Improved well. Seen by Dr. Gabriel Carina of endocrinology and changed to levemir SQ. Dose increased from 60 units to 80 units SQ daily along with aspart 20 units SQ pre meal TID. Blood sugars are much improved.  Stable at time of discharge. Follow up with Dr. Gabriel Carina as OP in one week.  Advised to check BS 4 times a day and keep log to take to her doctor's appt.   CONSULTS OBTAINED:  Treatment Team:  Dustin Flock, MD Judi Cong, MD  DRUG ALLERGIES:   Allergies  Allergen Reactions  . Demerol [Meperidine] Hives, Itching and Nausea And Vomiting  . Oxycodone Hives, Itching and Nausea And Vomiting  . Toradol [Ketorolac Tromethamine] Hives, Itching and Nausea And Vomiting    DISCHARGE MEDICATIONS:   Discharge Medication List as  of 11/15/2014 11:15 AM    START taking these medications   Details  insulin aspart (NOVOLOG) 100 UNIT/ML injection Inject 20 Units into the skin 3 (three) times daily with meals., Starting 11/15/2014, Until Discontinued, Print      CONTINUE these medications which have CHANGED   Details  insulin detemir (LEVEMIR) 100 UNIT/ML injection Inject 0.8 mLs (80 Units total) into the skin at bedtime., Starting 11/15/2014, Until Discontinued, No Print    metFORMIN (GLUCOPHAGE) 1000 MG tablet Take 0.5 tablets (500 mg total) by mouth 2 (two) times daily., Starting 11/15/2014, Until Discontinued, No Print      CONTINUE these medications which have NOT CHANGED   Details  lisinopril (PRINIVIL,ZESTRIL) 10 MG tablet Take 10 mg by mouth daily., Until Discontinued, Historical Med      STOP taking these medications     insulin regular (NOVOLIN R,HUMULIN R) 100 units/mL injection      insulin aspart protamine- aspart (NOVOLOG MIX 70/30) (70-30) 100 UNIT/ML injection          Today    VITAL SIGNS:  Blood pressure 117/81, pulse 72, temperature 98.1 F (36.7 C), temperature source Oral, resp. rate 18, height 5\' 6"  (1.676 m), weight 85.7 kg (188 lb 15 oz), last menstrual period 11/06/2014, SpO2 100 %.  I/O:  No intake or output data in the 24 hours ending 11/20/14 0852  PHYSICAL EXAMINATION:  Physical Exam  GENERAL:  34 y.o.-year-old patient lying in the bed with no  acute distress.  LUNGS: Normal breath sounds bilaterally, no wheezing, rales,rhonchi or crepitation. No use of accessory muscles of respiration.  CARDIOVASCULAR: S1, S2 normal. No murmurs, rubs, or gallops.  ABDOMEN: Soft, non-tender, non-distended. Bowel sounds present. No organomegaly or mass.  NEUROLOGIC: Moves all 4 extremities. PSYCHIATRIC: The patient is alert and oriented x 3.  SKIN: No obvious rash, lesion, or ulcer.   DATA REVIEW:   CBC  Recent Labs Lab 11/14/14 0022  WBC 10.7  HGB 10.6*  HCT 33.9*  PLT 295     Chemistries   Recent Labs Lab 11/14/14 1751  NA 131*  K 4.1  CL 104  CO2 20*  GLUCOSE 272*  BUN 14  CREATININE 0.77  CALCIUM 8.6*    Cardiac Enzymes No results for input(s): TROPONINI in the last 168 hours.  Microbiology Results  Results for orders placed or performed during the hospital encounter of 11/13/14  MRSA PCR Screening     Status: None   Collection Time: 11/13/14  9:35 PM  Result Value Ref Range Status   MRSA by PCR NEGATIVE NEGATIVE Final    Comment:        The GeneXpert MRSA Assay (FDA approved for NASAL specimens only), is one component of a comprehensive MRSA colonization surveillance program. It is not intended to diagnose MRSA infection nor to guide or monitor treatment for MRSA infections.     RADIOLOGY:  No results found.    Follow up with PCP in 1 week.  Management plans discussed with the patient, family and they are in agreement.  CODE STATUS:   TOTAL TIME TAKING CARE OF THIS PATIENT ON DAY OF DISCHARGE: more than 30 minutes.    Hillary Bow R M.D on 11/20/2014 at 8:52 AM  Between 7am to 6pm - Pager - [phone]  After 6pm go to [email or web address] - password [email or web address] [place] [facility]  Office  [phone]  CC: Primary care physician; PROVIDER NOT IN SYSTEM     Note: This dictation was prepared with Dragon dictation along with smaller phrase technology. Any transcriptional errors that result from this process are unintentional.

## 2015-09-24 ENCOUNTER — Encounter: Payer: Self-pay | Admitting: Emergency Medicine

## 2015-09-24 ENCOUNTER — Emergency Department
Admission: EM | Admit: 2015-09-24 | Discharge: 2015-09-24 | Disposition: A | Discharge status: Home / Self Care | Payer: Self-pay | Attending: Emergency Medicine | Admitting: Emergency Medicine

## 2015-09-24 DIAGNOSIS — R739 Hyperglycemia, unspecified: Secondary | ICD-10-CM

## 2015-09-24 DIAGNOSIS — Z794 Long term (current) use of insulin: Secondary | ICD-10-CM | POA: Insufficient documentation

## 2015-09-24 DIAGNOSIS — Z87891 Personal history of nicotine dependence: Secondary | ICD-10-CM | POA: Insufficient documentation

## 2015-09-24 DIAGNOSIS — I10 Essential (primary) hypertension: Secondary | ICD-10-CM | POA: Insufficient documentation

## 2015-09-24 DIAGNOSIS — E1165 Type 2 diabetes mellitus with hyperglycemia: Secondary | ICD-10-CM | POA: Insufficient documentation

## 2015-09-24 LAB — URINALYSIS COMPLETE WITH MICROSCOPIC (ARMC ONLY)
BILIRUBIN URINE: NEGATIVE
Bacteria, UA: NONE SEEN
Hgb urine dipstick: NEGATIVE
KETONES UR: NEGATIVE mg/dL
Nitrite: NEGATIVE
Protein, ur: NEGATIVE mg/dL
Specific Gravity, Urine: 1.026 (ref 1.005–1.030)
pH: 6 (ref 5.0–8.0)

## 2015-09-24 LAB — BASIC METABOLIC PANEL
Anion gap: 10 (ref 5–15)
BUN: 14 mg/dL (ref 6–20)
CHLORIDE: 98 mmol/L — AB (ref 101–111)
CO2: 21 mmol/L — AB (ref 22–32)
CREATININE: 1.03 mg/dL — AB (ref 0.44–1.00)
Calcium: 9.1 mg/dL (ref 8.9–10.3)
GFR calc non Af Amer: >60 mL/min (ref >60)
Glucose, Bld: 546 mg/dL (ref 65–99)
Potassium: 3.9 mmol/L (ref 3.5–5.1)
SODIUM: 129 mmol/L — AB (ref 135–145)

## 2015-09-24 LAB — CBC
HCT: 33.9 % — ABNORMAL LOW (ref 35.0–47.0)
Hemoglobin: 10.3 g/dL — ABNORMAL LOW (ref 12.0–16.0)
MCH: 20 pg — AB (ref 26.0–34.0)
MCHC: 30.4 g/dL — ABNORMAL LOW (ref 32.0–36.0)
MCV: 65.7 fL — AB (ref 80.0–100.0)
PLATELETS: 319 10*3/uL (ref 150–440)
RBC: 5.15 MIL/uL (ref 3.80–5.20)
RDW: 20.2 % — AB (ref 11.5–14.5)
WBC: 7.9 10*3/uL (ref 3.6–11.0)

## 2015-09-24 LAB — GLUCOSE, CAPILLARY
GLUCOSE-CAPILLARY: 489 mg/dL — AB (ref 65–99)
GLUCOSE-CAPILLARY: 390 mg/dL — AB (ref 65–99)
GLUCOSE-CAPILLARY: 328 mg/dL — AB (ref 65–99)
Glucose-Capillary: 566 mg/dL (ref 65–99)

## 2015-09-24 LAB — TROPONIN I

## 2015-09-24 LAB — MAGNESIUM: MAGNESIUM: 1.9 mg/dL (ref 1.7–2.4)

## 2015-09-24 MED ORDER — INSULIN ASPART 100 UNIT/ML ~~LOC~~ SOLN
10.0000 [IU] | Freq: Once | SUBCUTANEOUS | Status: DC
  Filled 2015-09-24: qty 10

## 2015-09-24 MED ORDER — SODIUM CHLORIDE 0.9 % IV BOLUS (SEPSIS)
1000.0000 mL | Freq: Once | INTRAVENOUS | Status: AC
  Administered 2015-09-24: 1000 mL via INTRAVENOUS

## 2015-09-24 MED ORDER — INSULIN ASPART 100 UNIT/ML ~~LOC~~ SOLN
10.0000 [IU] | Freq: Once | SUBCUTANEOUS | Status: AC
  Administered 2015-09-24: 10 [IU] via SUBCUTANEOUS

## 2015-09-24 MED ORDER — SODIUM CHLORIDE 0.9 % IV BOLUS (SEPSIS): 1000.0000 mL | Freq: Once | INTRAVENOUS | Status: DC

## 2015-09-24 NOTE — ED Notes (Signed)
Patient ambulatory to treatment room with steady gait, patient c/o blurry vision, reports for the past 2 days sugar has been high. Pt reports has been taking insulin and metformin as prescribed. Pt alert and oriented, no increased work in breathing noted, skin warm and dry.

## 2015-09-24 NOTE — ED Provider Notes (Signed)
[facility] Emergency Department Provider Note  ____________________________________________   I have reviewed the triage vital signs and the nursing notes.   HISTORY  Chief Complaint Hyperglycemia    HPI Jennifer [name] is a 35 y.o. female who presents today with hyperglycemia. Patient has a long history of very poorly controlled diabetes. She is on metformin twice a day as well as Lantus and sliding scale. She states she believes she has been compliant with her medication last few days but nonetheless gradually her sugars got up over the last 2 days. Patient has been here multiple times for elevated blood sugar including a 300 once a 1000 and another time. She does not have any focal complaints. She feels a little lightheaded when she stands up because she feels dehydrated because she has been having urinary frequency. Aside from that she has had no chest pain or shortness of breath no nausea no vomiting or diarrhea, she has frequency but not dysuria or UTI symptoms. She denies any pain anywhere. She denies headache. She denies stiff neck. She denies fever or chills. She states she is in her normal state of health when "my sugars going up again"       Past Medical History:  Diagnosis Date  . Diabetes mellitus without complication ([place])   . Hypertension     Patient Active Problem List   Diagnosis Date Noted  . Hyperglycemia 11/13/2014    Past Surgical History:  Procedure Laterality Date  . none      Prior to Admission medications   Medication Sig Start Date End Date Taking? Authorizing Provider  insulin aspart (NOVOLOG) 100 UNIT/ML injection Inject 20 Units into the skin 3 (three) times daily with meals. 11/15/14   Srikar Sudini, MD  insulin detemir (LEVEMIR) 100 UNIT/ML injection Inject 0.8 mLs (80 Units total) into the skin at bedtime. 11/15/14   Srikar Sudini, MD  lisinopril (PRINIVIL,ZESTRIL) 10 MG tablet Take 10 mg by mouth daily.    Historical  Provider, MD  metFORMIN (GLUCOPHAGE) 1000 MG tablet Take 0.5 tablets (500 mg total) by mouth 2 (two) times daily. 11/15/14   Hillary Bow, MD    Allergies Demerol [meperidine]; Oxycodone; and Toradol [ketorolac tromethamine]  Family History  Problem Relation Age of Onset  . Diabetes    . Hypertension      Social History Social History  Substance Use Topics  . Smoking status: Former Smoker    Packs/day: 0.00  . Smokeless tobacco: Never Used  . Alcohol use No    Review of Systems Constitutional: No fever/chills Eyes: No visual changes. ENT: No sore throat. No stiff neck no neck pain Cardiovascular: Denies chest pain. Respiratory: Denies shortness of breath. Gastrointestinal:   no vomiting.  No diarrhea.  No constipation. Genitourinary: Negative for dysuria. Musculoskeletal: Negative lower extremity swelling Skin: Negative for rash. Neurological: Negative for severe headaches, focal weakness or numbness. 10-point ROS otherwise negative.  ____________________________________________   PHYSICAL EXAM:  VITAL SIGNS: ED Triage Vitals  Enc Vitals Group     BP 09/24/15 1925 (!) 163/102     Pulse Rate 09/24/15 1925 (!) 114     Resp 09/24/15 1925 20     Temp 09/24/15 1925 98.5 F (36.9 C)     Temp Source 09/24/15 1925 Oral     SpO2 09/24/15 1925 100 %     Weight 09/24/15 1926 184 lb (83.5 kg)     Height 09/24/15 1926 5\' 6"  (1.676 m)     Head  Circumference --      Peak Flow --      Pain Score 09/24/15 1926 0     Pain Loc --      Pain Edu? --      Excl. in [place]? --     Constitutional: Alert and oriented. Well appearing and in no acute distress. Eyes: Conjunctivae are normal. PERRL. EOMI. Head: Atraumatic. Nose: No congestion/rhinnorhea. Mouth/Throat: Mucous membranes are Dry.  Oropharynx non-erythematous. Neck: No stridor.   Nontender with no meningismus Cardiovascular: Normal rate, regular rhythm. Grossly normal heart sounds.  Good peripheral  circulation. Respiratory: Normal respiratory effort.  No retractions. Lungs CTAB. Abdominal: Soft and nontender. No distention. No guarding no rebound Back:  There is no focal tenderness or step off.  there is no midline tenderness there are no lesions noted. there is no CVA tenderness Musculoskeletal: No lower extremity tenderness, no upper extremity tenderness. No joint effusions, no DVT signs strong distal pulses no edema Neurologic:  Normal speech and language. No gross focal neurologic deficits are appreciated.  Skin:  Skin is warm, dry and intact. No rash noted. Psychiatric: Mood and affect are normal. Speech and behavior are normal.  ____________________________________________   LABS (all labs ordered are listed, but only abnormal results are displayed)  Labs Reviewed  BASIC METABOLIC PANEL - Abnormal; Notable for the following:       Result Value   Sodium 129 (*)    Chloride 98 (*)    CO2 21 (*)    Glucose, Bld 546 (*)    Creatinine, Ser 1.03 (*)    All other components within normal limits  CBC - Abnormal; Notable for the following:    Hemoglobin 10.3 (*)    HCT 33.9 (*)    MCV 65.7 (*)    MCH 20.0 (*)    MCHC 30.4 (*)    RDW 20.2 (*)    All other components within normal limits  URINALYSIS COMPLETEWITH MICROSCOPIC ([facility] ONLY) - Abnormal; Notable for the following:    Color, Urine COLORLESS (*)    APPearance CLEAR (*)    Glucose, UA >500 (*)    Leukocytes, UA 1+ (*)    Squamous Epithelial / LPF 0-5 (*)    All other components within normal limits  GLUCOSE, CAPILLARY - Abnormal; Notable for the following:    Glucose-Capillary 566 (*)    All other components within normal limits  GLUCOSE, CAPILLARY - Abnormal; Notable for the following:    Glucose-Capillary 489 (*)    All other components within normal limits  MAGNESIUM  TROPONIN I  CBG MONITORING, ED   ____________________________________________  EKG  I personally interpreted any EKGs ordered by me or  triage Normal sinus tachycardia rate 112 bpm no acute ST elevation or depression normal axis unremarkable EKG ____________________________________________  RADIOLOGY  I reviewed any imaging ordered by me or triage that were performed during my shift and, if possible, patient and/or family made aware of any abnormal findings. ____________________________________________   PROCEDURES  Procedure(s) performed: None  Procedures  Critical Care performed: None  ____________________________________________   INITIAL IMPRESSION / ASSESSMENT AND PLAN / ED COURSE  Pertinent labs & imaging results that were available during my care of the patient were reviewed by me and considered in my medical decision making (see chart for details).  Patient with dehydration and elevated blood sugar again. No clear inciting cause, at this time nothing that indicates acute infection for example was a troponin to make sure she does not  have a white ischemia but I have very low suspicion of this. EKG is reassuring. She does appear dehydrated. We will give her IV fluid and insulin. Patient after the first bag of IV fluid has a sugar of 498 which essentially not far from her baseline, unfortunately, she does not have an anion gap or evidence of DKA. We hopefully can get her sugar under better control here and have her follow closely with her primary care doctor. No clear indication for admission exists.  Clinical Course   ____________________________________________   FINAL CLINICAL IMPRESSION(S) / ED DIAGNOSES  Final diagnoses:  None      This chart was dictated using voice recognition software.  Despite best efforts to proofread,  errors can occur which can change meaning.      Schuyler Amor, MD 09/24/15 2145

## 2015-09-24 NOTE — ED Triage Notes (Signed)
C/O high sugars for the past two days.  Home glucometer is reading high.

## 2015-09-24 NOTE — ED Notes (Signed)

## 2015-10-06 ENCOUNTER — Inpatient Hospital Stay
Admission: EM | Admit: 2015-10-06 | Discharge: 2015-10-09 | DRG: 638 | Disposition: A | Discharge status: Home / Self Care | Payer: Self-pay | Attending: Internal Medicine | Admitting: Internal Medicine

## 2015-10-06 ENCOUNTER — Encounter: Payer: Self-pay | Admitting: Emergency Medicine

## 2015-10-06 DIAGNOSIS — E871 Hypo-osmolality and hyponatremia: Secondary | ICD-10-CM | POA: Diagnosis present

## 2015-10-06 DIAGNOSIS — R519 Headache, unspecified: Secondary | ICD-10-CM

## 2015-10-06 DIAGNOSIS — Z7984 Long term (current) use of oral hypoglycemic drugs: Secondary | ICD-10-CM

## 2015-10-06 DIAGNOSIS — I10 Essential (primary) hypertension: Secondary | ICD-10-CM | POA: Diagnosis present

## 2015-10-06 DIAGNOSIS — Z87891 Personal history of nicotine dependence: Secondary | ICD-10-CM

## 2015-10-06 DIAGNOSIS — N179 Acute kidney failure, unspecified: Secondary | ICD-10-CM | POA: Diagnosis present

## 2015-10-06 DIAGNOSIS — E1010 Type 1 diabetes mellitus with ketoacidosis without coma: Principal | ICD-10-CM | POA: Diagnosis present

## 2015-10-06 DIAGNOSIS — R51 Headache: Secondary | ICD-10-CM

## 2015-10-06 DIAGNOSIS — E1310 Other specified diabetes mellitus with ketoacidosis without coma: Secondary | ICD-10-CM

## 2015-10-06 DIAGNOSIS — Z888 Allergy status to other drugs, medicaments and biological substances status: Secondary | ICD-10-CM

## 2015-10-06 DIAGNOSIS — E875 Hyperkalemia: Secondary | ICD-10-CM | POA: Diagnosis present

## 2015-10-06 DIAGNOSIS — D649 Anemia, unspecified: Secondary | ICD-10-CM | POA: Diagnosis present

## 2015-10-06 DIAGNOSIS — Z8249 Family history of ischemic heart disease and other diseases of the circulatory system: Secondary | ICD-10-CM

## 2015-10-06 DIAGNOSIS — E870 Hyperosmolality and hypernatremia: Secondary | ICD-10-CM | POA: Diagnosis present

## 2015-10-06 DIAGNOSIS — Z794 Long term (current) use of insulin: Secondary | ICD-10-CM

## 2015-10-06 DIAGNOSIS — Z6830 Body mass index (BMI) 30.0-30.9, adult: Secondary | ICD-10-CM

## 2015-10-06 DIAGNOSIS — Z833 Family history of diabetes mellitus: Secondary | ICD-10-CM

## 2015-10-06 DIAGNOSIS — E869 Volume depletion, unspecified: Secondary | ICD-10-CM | POA: Diagnosis present

## 2015-10-06 DIAGNOSIS — E669 Obesity, unspecified: Secondary | ICD-10-CM | POA: Diagnosis present

## 2015-10-06 HISTORY — DX: Type 1 diabetes mellitus with ketoacidosis without coma: E10.10

## 2015-10-06 LAB — BLOOD GAS, VENOUS
Acid-base deficit: 10.1 mmol/L — ABNORMAL HIGH (ref 0.0–2.0)
BICARBONATE: 15.3 mmol/L — AB (ref 20.0–28.0)
O2 SAT: 83.8 %
PATIENT TEMPERATURE: 37
pCO2, Ven: 31 mmHg — ABNORMAL LOW (ref 44.0–60.0)
pH, Ven: 7.3 (ref 7.250–7.430)
pO2, Ven: 54 mmHg — ABNORMAL HIGH (ref 32.0–45.0)

## 2015-10-06 LAB — URINALYSIS COMPLETE WITH MICROSCOPIC (ARMC ONLY)
BACTERIA UA: NONE SEEN
Bilirubin Urine: NEGATIVE
Glucose, UA: >500 mg/dL — AB
LEUKOCYTES UA: NEGATIVE
NITRITE: NEGATIVE
PROTEIN: NEGATIVE mg/dL
SPECIFIC GRAVITY, URINE: 1.022 (ref 1.005–1.030)
pH: 5 (ref 5.0–8.0)

## 2015-10-06 LAB — COMPREHENSIVE METABOLIC PANEL
ALBUMIN: 3.9 g/dL (ref 3.5–5.0)
ALT: 14 U/L (ref 14–54)
ANION GAP: 14 (ref 5–15)
AST: 13 U/L — ABNORMAL LOW (ref 15–41)
Alkaline Phosphatase: 72 U/L (ref 38–126)
BUN: 17 mg/dL (ref 6–20)
CHLORIDE: 95 mmol/L — AB (ref 101–111)
CO2: 15 mmol/L — AB (ref 22–32)
Calcium: 9.9 mg/dL (ref 8.9–10.3)
Creatinine, Ser: 1.03 mg/dL — ABNORMAL HIGH (ref 0.44–1.00)
GFR calc Af Amer: >60 mL/min (ref >60)
GFR calc non Af Amer: >60 mL/min (ref >60)
GLUCOSE: 585 mg/dL — AB (ref 65–99)
POTASSIUM: 4.7 mmol/L (ref 3.5–5.1)
SODIUM: 124 mmol/L — AB (ref 135–145)
Total Bilirubin: 0.8 mg/dL (ref 0.3–1.2)
Total Protein: 8.3 g/dL — ABNORMAL HIGH (ref 6.5–8.1)

## 2015-10-06 LAB — CBC WITH DIFFERENTIAL/PLATELET
Basophils Absolute: 0.1 10*3/uL (ref 0–0.1)
EOS ABS: 0.2 10*3/uL (ref 0–0.7)
Eosinophils Relative: 2 %
HCT: 32.7 % — ABNORMAL LOW (ref 35.0–47.0)
HEMOGLOBIN: 9.8 g/dL — AB (ref 12.0–16.0)
Lymphocytes Relative: 18 %
Lymphs Abs: 2.2 10*3/uL (ref 1.0–3.6)
MCH: 20.1 pg — ABNORMAL LOW (ref 26.0–34.0)
MCHC: 30 g/dL — AB (ref 32.0–36.0)
MCV: 67.1 fL — ABNORMAL LOW (ref 80.0–100.0)
MONO ABS: 0.7 10*3/uL (ref 0.2–0.9)
Neutro Abs: 8.7 10*3/uL — ABNORMAL HIGH (ref 1.4–6.5)
PLATELETS: 282 10*3/uL (ref 150–440)
RBC: 4.87 MIL/uL (ref 3.80–5.20)
RDW: 20.2 % — AB (ref 11.5–14.5)
WBC: 11.8 10*3/uL — ABNORMAL HIGH (ref 3.6–11.0)

## 2015-10-06 LAB — GLUCOSE, CAPILLARY
GLUCOSE-CAPILLARY: 358 mg/dL — AB (ref 65–99)
GLUCOSE-CAPILLARY: 582 mg/dL — AB (ref 65–99)
Glucose-Capillary: 403 mg/dL — ABNORMAL HIGH (ref 65–99)

## 2015-10-06 LAB — TROPONIN I: Troponin I: <0.03 ng/mL (ref <0.03)

## 2015-10-06 LAB — URINE DRUG SCREEN, QUALITATIVE (ARMC ONLY)
AMPHETAMINES, UR SCREEN: NOT DETECTED
Barbiturates, Ur Screen: NOT DETECTED
Benzodiazepine, Ur Scrn: NOT DETECTED
COCAINE METABOLITE, UR ~~LOC~~: NOT DETECTED
Cannabinoid 50 Ng, Ur ~~LOC~~: NOT DETECTED
MDMA (ECSTASY) UR SCREEN: NOT DETECTED
Methadone Scn, Ur: NOT DETECTED
OPIATE, UR SCREEN: NOT DETECTED
PHENCYCLIDINE (PCP) UR S: NOT DETECTED
Tricyclic, Ur Screen: NOT DETECTED

## 2015-10-06 LAB — MAGNESIUM: MAGNESIUM: 2.1 mg/dL (ref 1.7–2.4)

## 2015-10-06 LAB — T4, FREE: Free T4: 0.94 ng/dL (ref 0.61–1.12)

## 2015-10-06 LAB — BETA-HYDROXYBUTYRIC ACID: BETA-HYDROXYBUTYRIC ACID: 4.38 mmol/L — AB (ref 0.05–0.27)

## 2015-10-06 LAB — PHOSPHORUS: PHOSPHORUS: 4.9 mg/dL — AB (ref 2.5–4.6)

## 2015-10-06 LAB — TSH: TSH: 0.809 u[IU]/mL (ref 0.350–4.500)

## 2015-10-06 MED ORDER — INSULIN ASPART 100 UNIT/ML ~~LOC~~ SOLN
10.0000 [IU] | Freq: Once | SUBCUTANEOUS | Status: AC
  Administered 2015-10-06: 10 [IU] via INTRAVENOUS
  Filled 2015-10-06: qty 10

## 2015-10-06 MED ORDER — SODIUM CHLORIDE 0.9 % IV BOLUS (SEPSIS): 1000.0000 mL | Freq: Every day | INTRAVENOUS | Status: DC

## 2015-10-06 MED ORDER — ACETAMINOPHEN 325 MG PO TABS
650.0000 mg | ORAL_TABLET | Freq: Four times a day (QID) | ORAL | Status: DC | PRN
  Administered 2015-10-07 – 2015-10-08 (×3): 650 mg via ORAL
  Filled 2015-10-06 (×3): qty 2

## 2015-10-06 MED ORDER — MORPHINE SULFATE (PF) 4 MG/ML IV SOLN
4.0000 mg | Freq: Once | INTRAVENOUS | Status: AC
  Administered 2015-10-06: 4 mg via INTRAVENOUS
  Filled 2015-10-06: qty 1

## 2015-10-06 MED ORDER — INSULIN ASPART 100 UNIT/ML ~~LOC~~ SOLN
5.0000 [IU] | Freq: Once | SUBCUTANEOUS | Status: AC
  Administered 2015-10-06: 5 [IU] via SUBCUTANEOUS
  Filled 2015-10-06: qty 5

## 2015-10-06 MED ORDER — DEXTROSE-NACL 5-0.9 % IV SOLN
INTRAVENOUS | Status: DC
  Administered 2015-10-07: 04:00:00 via INTRAVENOUS

## 2015-10-06 MED ORDER — ONDANSETRON HCL 4 MG/2ML IJ SOLN
4.0000 mg | Freq: Four times a day (QID) | INTRAMUSCULAR | Status: DC | PRN
  Administered 2015-10-08 (×2): 4 mg via INTRAVENOUS
  Filled 2015-10-06 (×2): qty 2

## 2015-10-06 MED ORDER — SODIUM CHLORIDE 0.9 % IV SOLN
INTRAVENOUS | Status: DC
  Administered 2015-10-06: 3.4 [IU]/h via INTRAVENOUS
  Administered 2015-10-07: 1.4 [IU]/h via INTRAVENOUS
  Administered 2015-10-07: 3.9 [IU]/h via INTRAVENOUS
  Filled 2015-10-06: qty 2.5

## 2015-10-06 MED ORDER — ONDANSETRON HCL 4 MG/2ML IJ SOLN
INTRAMUSCULAR | Status: AC
Start: 2015-10-06 — End: 2015-10-07
  Filled 2015-10-06: qty 2

## 2015-10-06 MED ORDER — HEPARIN SODIUM (PORCINE) 5000 UNIT/ML IJ SOLN
5000.0000 [IU] | Freq: Three times a day (TID) | INTRAMUSCULAR | Status: DC
  Administered 2015-10-07: 5000 [IU] via SUBCUTANEOUS
  Filled 2015-10-06: qty 1

## 2015-10-06 MED ORDER — SODIUM CHLORIDE 0.9 % IV SOLN
1000.0000 mL | Freq: Once | INTRAVENOUS | Status: AC
  Administered 2015-10-06: 1000 mL via INTRAVENOUS

## 2015-10-06 MED ORDER — ONDANSETRON HCL 4 MG/2ML IJ SOLN
4.0000 mg | Freq: Once | INTRAMUSCULAR | Status: AC
  Administered 2015-10-06: 4 mg via INTRAVENOUS

## 2015-10-06 MED ORDER — LISINOPRIL 20 MG PO TABS
20.0000 mg | ORAL_TABLET | ORAL | Status: DC
  Administered 2015-10-07 – 2015-10-08 (×2): 20 mg via ORAL
  Filled 2015-10-06 (×2): qty 1

## 2015-10-06 NOTE — ED Notes (Signed)
Orders noted in [facility] for insulin aspart; 10 units IVP and 5 units SQ. MD made aware of FSBS of 582. MD with [facility]OR[facility] to proceed with both IV and SQ insulin as ordered.

## 2015-10-06 NOTE — H&P (Signed)
PULMONARY / CRITICAL CARE MEDICINE   Name: Jennifer [name] MRN: [id number] DOB: May 29, 1980    ADMISSION DATE:  10/06/2015   REFERRING MD:  Dr. Lenise Arena  CHIEF COMPLAINT:  "my head is aching and I can't get my blood sugar under control"  HISTORY OF PRESENT ILLNESS:   This is a 35 y/o AA female with a h/o IDDM, and hypertension who presents with headache and hyperglycemia x 3 days. She states that her blood glucose meter has been reading high. She is on insulin and metformin and reports taking it as prescribed. Also reports severe thirst and increased urination. She reports poor appetite which she attributes to excessive water intake and and headache. Describes headache a frontal, 10/10 in intensity, can't think of aggravating factors; only relieving factor is morphine. Reports having headaches when blood glucose is high. She denies fever, chills, nausea and vomiting. No recent illness.  At the ED, her blood glucose level was >500mg /dl, sodium 129, and creatinine 1.03(baseline 0.66). Patient is being admitted for glucose stabilization and volume replacement. She was in the ED in August with similar symptoms.   PAST MEDICAL HISTORY :  She  has a past medical history of Diabetes mellitus without complication ([place]) and Hypertension.  PAST SURGICAL HISTORY: She  has a past surgical history that includes none.  Allergies  Allergen Reactions  . Demerol [Meperidine] Hives, Itching and Nausea And Vomiting  . Oxycodone Hives, Itching and Nausea And Vomiting  . Toradol [Ketorolac Tromethamine] Hives, Itching and Nausea And Vomiting    No current facility-administered medications on file prior to encounter.    Current Outpatient Prescriptions on File Prior to Encounter  Medication Sig  . insulin detemir (LEVEMIR) 100 UNIT/ML injection Inject 0.8 mLs (80 Units total) into the skin at bedtime. (Patient taking differently: Inject 20 Units into the skin 3 (three) times daily with meals. )  .  metFORMIN (GLUCOPHAGE) 1000 MG tablet Take 0.5 tablets (500 mg total) by mouth 2 (two) times daily. (Patient taking differently: Take 2,000 mg by mouth 2 (two) times daily. )  . insulin aspart (NOVOLOG) 100 UNIT/ML injection Inject 20 Units into the skin 3 (three) times daily with meals.    FAMILY HISTORY:  Family History  Problem Relation Age of Onset  . Diabetes    . Hypertension      SOCIAL HISTORY: She  reports that she has quit smoking. She smoked 0.00 packs per day. She has never used smokeless tobacco. She reports that she does not drink alcohol or use drugs.  REVIEW OF SYSTEMS:   Constitutional: Negative for fever and chills.  HENT: Negative for congestion and rhinorrhea.  Eyes: Negative for redness and visual disturbance.  Respiratory: Negative for shortness of breath and wheezing.  Cardiovascular: Negative for chest pain and palpitations.  Gastrointestinal: Negative  for nausea , vomiting and abdominal pain and loose stools Genitourinary: Negative for dysuria and urgency but positive for polyuria.  Endocrine: Reports polyuria, polydipsia but denies heat/cold intolerance Musculoskeletal: Negative for myalgias and arthralgias.  Skin: Negative for pallor and wound.  Neurological: Negative for dizziness but positive for headaches   SUBJECTIVE:   VITAL SIGNS: BP (!) 129/94 (BP Location: Left Arm)   Pulse 91   Temp 99.4 F (37.4 C) (Oral)   Resp 16   Ht 5\' 6"  (1.676 m)   Wt 190 lb (86.2 kg)   LMP 08/21/2015   SpO2 98%   BMI 30.67 kg/m   HEMODYNAMICS:    VENTILATOR SETTINGS:  INTAKE / OUTPUT: No intake/output data recorded.  PHYSICAL EXAMINATION: General: Well nourished, well developed, NAD Neuro:  AAO X4, no focal deficits HEENT:[place]/AT, PERRLA, sclera anicterus, oral mucous pink, trachea midline   Cardiovascular: RRR, S1/S2, no MRG Lungs:  ctab Abdomen:  Non-distended, normal BS, No palpable organomegaly Musculoskeletal:  +rom, no  deformities Extremities: +2 pulses, no edema Skin:  Warm and dry  LABS:  BMET  Recent Labs Lab 10/06/15 1928  NA 124*  K 4.7  CL 95*  CO2 15*  BUN 17  CREATININE 1.03*  GLUCOSE 585*    Electrolytes  Recent Labs Lab 10/06/15 1928  CALCIUM 9.9  MG 2.1  PHOS 4.9*    CBC  Recent Labs Lab 10/06/15 1928 10/07/15 0430  WBC 11.8* 10.3  HGB 9.8* 9.9*  HCT 32.7* 32.4*  PLT 282 284    Coag's No results for input(s): APTT, INR in the last 168 hours.  Sepsis Markers No results for input(s): LATICACIDVEN, PROCALCITON, O2SATVEN in the last 168 hours.  ABG No results for input(s): PHART, PCO2ART, PO2ART in the last 168 hours.  Liver Enzymes  Recent Labs Lab 10/06/15 1928  AST 13*  ALT 14  ALKPHOS 72  BILITOT 0.8  ALBUMIN 3.9    Cardiac Enzymes  Recent Labs Lab 10/06/15 1928  TROPONINI <0.03    Glucose  Recent Labs Lab 10/06/15 2226 10/06/15 2335 10/07/15 0021 10/07/15 0206 10/07/15 0405 10/07/15 0455  GLUCAP 403* 358* 294* 257* 190* 167*    Imaging No results found.   STUDIES:  None  CULTURES: None  ANTIBIOTICS: None  SIGNIFICANT EVENTS: 08/21>ED with blood glucose of 546>treated and released 09/02>ED with blood glucose of 585  LINES/TUBES: PIVs  DISCUSSION: 35 yo AA female presenting with recurrent hyperglycemia, mild AKI and hyponatremia. No anion gap  ASSESSMENT / PLAN:  ENDOCRINE A:   Uncontrolled TIDM Mild DKA  P:   Blood glucose monitoring and insulin infusion per protocol BMP Q6H x3  Diabetes education consult Initiate oral intake once blood glucose is 200mg /dl IV hydration per DKA protocol  CARDIOVASCULAR A:  H/o hypertension P:  Continue home medications Hemodynamics per ICU  RENAL A:   Mild AKI secondary to volume depletion P:   IV hydration Trend creatinine Monitor and correct electrolyte imbalances  HEMATOLOGIC A:   Mild anemia P:  Monitor CBC and transfuse for Hg<7 Heparin for VTE  prophylaxis  NEUROLOGIC A:   Headache P:   RASS goal: n/a PRN tylenol and if no relieve may have a one time dose of  Motrin 600 mg Monitor neuro status   Disposition and family update: Admit to SDU. Patient updated in current treatment plan  [name] [facility] ANP-BC Pulmonary and Critical Care Medicine [facility] Pager [phone] or [phone] 10/07/2015, 5:07 AM   Merton Border, MD PCCM service Mobile [phone] Pager [phone] 10/07/2015

## 2015-10-06 NOTE — ED Provider Notes (Signed)
[facility] Emergency Department Provider Note        Time seen: ----------------------------------------- 7:04 PM on 10/06/2015 -----------------------------------------    I have reviewed the triage vital signs and the nursing notes.   HISTORY  Chief Complaint Hyperglycemia    HPI Jennifer [name] is a 35 y.o. female who presents to the ER for elevated blood sugars for the last 3 days. Patient reportedly has had sugars reading critical high.Patient states when she left here 10 days ago she was on the same insulin regimen including Lantus, metformin and short acting insulin. She denies recent illness, states she's been urinating profusely and drinking water to try to compensate. She states her blood sugars have been over 1000 and the past.   Past Medical History:  Diagnosis Date  . Diabetes mellitus without complication ([place])   . Hypertension     Patient Active Problem List   Diagnosis Date Noted  . Hyperglycemia 11/13/2014    Past Surgical History:  Procedure Laterality Date  . none      Allergies Demerol [meperidine]; Oxycodone; and Toradol [ketorolac tromethamine]  Social History Social History  Substance Use Topics  . Smoking status: Former Smoker    Packs/day: 0.00  . Smokeless tobacco: Never Used  . Alcohol use No    Review of Systems Constitutional: Negative for fever. Cardiovascular: Negative for chest pain. Respiratory: Negative for shortness of breath. Gastrointestinal: Negative for abdominal pain, vomiting and diarrhea. Genitourinary: Positive for polyuria Musculoskeletal: Negative for back pain. Skin: Negative for rash. Neurological: Negative for headaches, Positive for weakness  10-point ROS otherwise negative.  ____________________________________________   PHYSICAL EXAM:  VITAL SIGNS: ED Triage Vitals  Enc Vitals Group     BP 10/06/15 1856 (!) 154/87     Pulse Rate 10/06/15 1856 93     Resp 10/06/15 1856  20     Temp 10/06/15 1856 98.6 F (37 C)     Temp Source 10/06/15 1856 Oral     SpO2 10/06/15 1856 99 %     Weight 10/06/15 1857 190 lb (86.2 kg)     Height 10/06/15 1857 5\' 6"  (1.676 m)     Head Circumference --      Peak Flow --      Pain Score 10/06/15 1857 8     Pain Loc --      Pain Edu? --      Excl. in [employer]? --     Constitutional: Alert and oriented. Well appearing and in no distress. Eyes: Conjunctivae are normal. PERRL. Normal extraocular movements. ENT   Head: Normocephalic and atraumatic.   Nose: No congestion/rhinnorhea.   Mouth/Throat: Mucous membranes are moist.   Neck: No stridor. Possible thyromegaly Cardiovascular: Normal rate, regular rhythm. No murmurs, rubs, or gallops. Respiratory: Normal respiratory effort without tachypnea nor retractions. Breath sounds are clear and equal bilaterally. No wheezes/rales/rhonchi. Gastrointestinal: Soft and nontender. Normal bowel sounds Musculoskeletal: Nontender with normal range of motion in all extremities. No lower extremity tenderness nor edema. Neurologic:  Normal speech and language. No gross focal neurologic deficits are appreciated.  Skin:  Skin is warm, dry and intact. No rash noted. Psychiatric: Mood and affect are normal. Speech and behavior are normal.  ____________________________________________  EKG: Interpreted by me.Sinus rhythm rate of 94 bpm, normal PR and we'll, normal QRS size, normal QT interval. Nonspecific ST segment changes in 1 and aVL.  ____________________________________________  ED COURSE:  Pertinent labs & imaging results that were available during my care of  the patient were reviewed by me and considered in my medical decision making (see chart for details). Clinical Course  She presents to ER with hyperglycemia, we will assess with basic labs, give IV fluids and insulin.  Procedures ____________________________________________   LABS (pertinent positives/negatives)  Labs  Reviewed  CBC WITH DIFFERENTIAL/PLATELET - Abnormal; Notable for the following:       Result Value   WBC 11.8 (*)    Hemoglobin 9.8 (*)    HCT 32.7 (*)    MCV 67.1 (*)    MCH 20.1 (*)    MCHC 30.0 (*)    RDW 20.2 (*)    Neutro Abs 8.7 (*)    All other components within normal limits  COMPREHENSIVE METABOLIC PANEL - Abnormal; Notable for the following:    Sodium 124 (*)    Chloride 95 (*)    CO2 15 (*)    Glucose, Bld 585 (*)    Creatinine, Ser 1.03 (*)    Total Protein 8.3 (*)    AST 13 (*)    All other components within normal limits  URINALYSIS COMPLETEWITH MICROSCOPIC ([facility] ONLY) - Abnormal; Notable for the following:    Color, Urine COLORLESS (*)    APPearance CLEAR (*)    Glucose, UA >500 (*)    Ketones, ur 2+ (*)    Hgb urine dipstick 1+ (*)    Squamous Epithelial / LPF 0-5 (*)    All other components within normal limits  BETA-HYDROXYBUTYRIC ACID - Abnormal; Notable for the following:    Beta-Hydroxybutyric Acid 4.38 (*)    All other components within normal limits  BLOOD GAS, VENOUS - Abnormal; Notable for the following:    pCO2, Ven 31 (*)    pO2, Ven 54.0 (*)    Bicarbonate 15.3 (*)    Acid-base deficit 10.1 (*)    All other components within normal limits  GLUCOSE, CAPILLARY - Abnormal; Notable for the following:    Glucose-Capillary 582 (*)    All other components within normal limits  TROPONIN I  TSH  T4, FREE  ____________________________________________  CRITICAL CARE Performed by: Earleen Newport   Total critical care time: 30 minutes  Critical care time was exclusive of separately billable procedures and treating other patients.  Critical care was necessary to treat or prevent imminent or life-threatening deterioration.  Critical care was time spent personally by me on the following activities: development of treatment plan with patient and/or surrogate as well as nursing, discussions with consultants, evaluation of patient's response to  treatment, examination of patient, obtaining history from patient or surrogate, ordering and performing treatments and interventions, ordering and review of laboratory studies, ordering and review of radiographic studies, pulse oximetry and re-evaluation of patient's condition.   FINAL ASSESSMENT AND PLAN  Hyperglycemia, Hyponatremia, mild diabetic ketoacidosis  Plan: Patient with labs and imaging as dictated above. Patient presents to the ER with poorly controlled blood sugars at home. She appears to be in at least mild DKA. I will place her on a continuous insulin infusion and place her in a stepdown ICU bed. She is stable for admission at this time.   Earleen Newport, MD   Note: This dictation was prepared with Dragon dictation. Any transcriptional errors that result from this process are unintentional    Earleen Newport, MD 10/06/15 2208

## 2015-10-06 NOTE — ED Triage Notes (Signed)
States FSBS reading "HI" x 3 days. Fatigued and headache.

## 2015-10-07 ENCOUNTER — Encounter: Payer: Self-pay | Admitting: *Deleted

## 2015-10-07 DIAGNOSIS — E1010 Type 1 diabetes mellitus with ketoacidosis without coma: Principal | ICD-10-CM

## 2015-10-07 LAB — GLUCOSE, CAPILLARY
GLUCOSE-CAPILLARY: 167 mg/dL — AB (ref 65–99)
GLUCOSE-CAPILLARY: 179 mg/dL — AB (ref 65–99)
GLUCOSE-CAPILLARY: 196 mg/dL — AB (ref 65–99)
GLUCOSE-CAPILLARY: 258 mg/dL — AB (ref 65–99)
GLUCOSE-CAPILLARY: 265 mg/dL — AB (ref 65–99)
GLUCOSE-CAPILLARY: 266 mg/dL — AB (ref 65–99)
GLUCOSE-CAPILLARY: 270 mg/dL — AB (ref 65–99)
GLUCOSE-CAPILLARY: 284 mg/dL — AB (ref 65–99)
GLUCOSE-CAPILLARY: 377 mg/dL — AB (ref 65–99)
GLUCOSE-CAPILLARY: 435 mg/dL — AB (ref 65–99)
Glucose-Capillary: 128 mg/dL — ABNORMAL HIGH (ref 65–99)
Glucose-Capillary: 190 mg/dL — ABNORMAL HIGH (ref 65–99)
Glucose-Capillary: 205 mg/dL — ABNORMAL HIGH (ref 65–99)
Glucose-Capillary: 257 mg/dL — ABNORMAL HIGH (ref 65–99)
Glucose-Capillary: 262 mg/dL — ABNORMAL HIGH (ref 65–99)
Glucose-Capillary: 269 mg/dL — ABNORMAL HIGH (ref 65–99)
Glucose-Capillary: 270 mg/dL — ABNORMAL HIGH (ref 65–99)
Glucose-Capillary: 294 mg/dL — ABNORMAL HIGH (ref 65–99)
Glucose-Capillary: 348 mg/dL — ABNORMAL HIGH (ref 65–99)

## 2015-10-07 LAB — BASIC METABOLIC PANEL
ANION GAP: 10 (ref 5–15)
BUN: 16 mg/dL (ref 6–20)
CALCIUM: 9.7 mg/dL (ref 8.9–10.3)
CO2: 18 mmol/L — AB (ref 22–32)
CREATININE: 0.66 mg/dL (ref 0.44–1.00)
Chloride: 106 mmol/L (ref 101–111)
Glucose, Bld: 164 mg/dL — ABNORMAL HIGH (ref 65–99)
Potassium: 3.4 mmol/L — ABNORMAL LOW (ref 3.5–5.1)
SODIUM: 134 mmol/L — AB (ref 135–145)

## 2015-10-07 LAB — CBC
HCT: 32.4 % — ABNORMAL LOW (ref 35.0–47.0)
HEMOGLOBIN: 9.9 g/dL — AB (ref 12.0–16.0)
MCH: 19.8 pg — AB (ref 26.0–34.0)
MCHC: 30.6 g/dL — ABNORMAL LOW (ref 32.0–36.0)
MCV: 64.6 fL — ABNORMAL LOW (ref 80.0–100.0)
PLATELETS: 284 10*3/uL (ref 150–440)
RBC: 5.02 MIL/uL (ref 3.80–5.20)
RDW: 19.9 % — ABNORMAL HIGH (ref 11.5–14.5)
WBC: 10.3 10*3/uL (ref 3.6–11.0)

## 2015-10-07 LAB — MRSA PCR SCREENING: MRSA BY PCR: NEGATIVE

## 2015-10-07 MED ORDER — INSULIN ASPART 100 UNIT/ML ~~LOC~~ SOLN: 0.0000 [IU] | Freq: Four times a day (QID) | SUBCUTANEOUS | Status: DC

## 2015-10-07 MED ORDER — ETOMIDATE 2 MG/ML IV SOLN: 10.0000 mg | Freq: Once | INTRAVENOUS | Status: DC

## 2015-10-07 MED ORDER — INSULIN ASPART 100 UNIT/ML ~~LOC~~ SOLN
15.0000 [IU] | Freq: Once | SUBCUTANEOUS | Status: DC
  Filled 2015-10-07: qty 15

## 2015-10-07 MED ORDER — FLUCONAZOLE 100 MG PO TABS
150.0000 mg | ORAL_TABLET | Freq: Once | ORAL | Status: AC
  Administered 2015-10-07: 150 mg via ORAL
  Filled 2015-10-07: qty 2

## 2015-10-07 MED ORDER — INSULIN GLARGINE 100 UNIT/ML ~~LOC~~ SOLN
30.0000 [IU] | Freq: Every day | SUBCUTANEOUS | Status: DC
  Administered 2015-10-07: 30 [IU] via SUBCUTANEOUS
  Filled 2015-10-07 (×2): qty 0.3

## 2015-10-07 MED ORDER — OXYCODONE HCL 5 MG PO TABS: 5.0000 mg | ORAL_TABLET | ORAL | Status: DC | PRN

## 2015-10-07 MED ORDER — PROPOFOL 1000 MG/100ML IV EMUL: 5.0000 ug/kg/min | INTRAVENOUS | Status: DC

## 2015-10-07 MED ORDER — INSULIN ASPART 100 UNIT/ML ~~LOC~~ SOLN
15.0000 [IU] | Freq: Once | SUBCUTANEOUS | Status: AC
  Administered 2015-10-07: 15 [IU] via SUBCUTANEOUS

## 2015-10-07 MED ORDER — MORPHINE SULFATE (PF) 4 MG/ML IV SOLN
4.0000 mg | Freq: Once | INTRAVENOUS | Status: AC
Start: 2015-10-07 — End: 2015-10-07
  Administered 2015-10-07: 4 mg via INTRAVENOUS
  Filled 2015-10-07: qty 1

## 2015-10-07 MED ORDER — ONDANSETRON HCL 4 MG/2ML IJ SOLN
4.0000 mg | Freq: Once | INTRAMUSCULAR | Status: AC
  Administered 2015-10-07: 4 mg via INTRAVENOUS
  Filled 2015-10-07: qty 2

## 2015-10-07 MED ORDER — IBUPROFEN 600 MG PO TABS
600.0000 mg | ORAL_TABLET | Freq: Once | ORAL | Status: AC
  Administered 2015-10-07: 600 mg via ORAL
  Filled 2015-10-07: qty 1

## 2015-10-07 MED ORDER — INSULIN ASPART 100 UNIT/ML ~~LOC~~ SOLN: 0.0000 [IU] | SUBCUTANEOUS | Status: DC

## 2015-10-07 MED ORDER — INSULIN ASPART 100 UNIT/ML ~~LOC~~ SOLN
0.0000 [IU] | Freq: Three times a day (TID) | SUBCUTANEOUS | Status: DC
  Administered 2015-10-07: 7 [IU] via SUBCUTANEOUS
  Administered 2015-10-07: 3 [IU] via SUBCUTANEOUS
  Filled 2015-10-07 (×2): qty 3

## 2015-10-07 MED ORDER — HYDROMORPHONE HCL 1 MG/ML IJ SOLN
0.5000 mg | INTRAMUSCULAR | Status: DC | PRN
  Administered 2015-10-07 – 2015-10-08 (×7): 0.5 mg via INTRAVENOUS
  Filled 2015-10-07 (×7): qty 1

## 2015-10-07 MED ORDER — INSULIN ASPART 100 UNIT/ML ~~LOC~~ SOLN: 0.0000 [IU] | Freq: Every day | SUBCUTANEOUS | Status: DC

## 2015-10-07 MED ORDER — MAGNESIUM HYDROXIDE 400 MG/5ML PO SUSP
30.0000 mL | Freq: Every day | ORAL | Status: DC | PRN
  Administered 2015-10-07: 30 mL via ORAL
  Filled 2015-10-07: qty 30

## 2015-10-07 MED ORDER — SUCCINYLCHOLINE CHLORIDE 20 MG/ML IJ SOLN: 120.0000 mg | Freq: Once | INTRAMUSCULAR | Status: DC

## 2015-10-07 MED ORDER — ENOXAPARIN SODIUM 40 MG/0.4ML ~~LOC~~ SOLN
40.0000 mg | SUBCUTANEOUS | Status: DC
  Administered 2015-10-07 – 2015-10-08 (×2): 40 mg via SUBCUTANEOUS
  Filled 2015-10-07 (×2): qty 0.4

## 2015-10-07 MED ORDER — MIDAZOLAM HCL 2 MG/2ML IJ SOLN: 2.0000 mg | Freq: Once | INTRAMUSCULAR | Status: DC

## 2015-10-07 MED ORDER — INSULIN ASPART 100 UNIT/ML ~~LOC~~ SOLN
4.0000 [IU] | Freq: Three times a day (TID) | SUBCUTANEOUS | Status: DC
  Administered 2015-10-07 (×2): 4 [IU] via SUBCUTANEOUS
  Filled 2015-10-07 (×2): qty 4

## 2015-10-07 MED ORDER — POTASSIUM CHLORIDE 20 MEQ PO PACK
60.0000 meq | PACK | Freq: Once | ORAL | Status: AC
  Administered 2015-10-07: 60 meq via ORAL
  Filled 2015-10-07: qty 3

## 2015-10-07 NOTE — Progress Notes (Addendum)
Pt's CBG is 435. Harless Nakayama, NP is aware. Per NP will give 15 units of novolog now and recheck CBG in 2 hours.

## 2015-10-07 NOTE — Progress Notes (Signed)
SSI + meal coverage of 4 units initiated at lunch time.  CBG is 196.  Dr Alva Garnet alerted insulin drip is still on at 9.5.  No longer bolusing for carbs however as SSI has been started.  Dr acknowledged.

## 2015-10-07 NOTE — Progress Notes (Signed)
PULMONARY / CRITICAL CARE MEDICINE   Name: Jennifer [name] MRN: [id number] DOB: 1980/11/28    ADMISSION DATE:  10/06/2015   REFERRING MD:  Dr. Lenise Arena  CHIEF COMPLAINT:  "my head is aching and I can't get my blood sugar under control"  HISTORY OF PRESENT ILLNESS:   This is a 35 y/o AA female with a h/o IDDM, and hypertension who presents with headache and hyperglycemia x 3 days. She states that her blood glucose meter has been reading high. She is on insulin and metformin and reports taking it as prescribed. Also reports severe thirst and increased urination. She reports poor appetite which she attributes to excessive water intake and and headache. Describes headache a frontal, 10/10 in intensity, can't think of aggravating factors; only relieving factor is morphine. Reports having headaches when blood glucose is high. She denies fever, chills, nausea and vomiting. No recent illness.  At the ED, her blood glucose level was >500mg /dl, sodium 129, and creatinine 1.03(baseline 0.66). Patient is being admitted for glucose stabilization and volume replacement. She was in the ED in August with similar symptoms.   SUBJECTIVE: No acute issues overnight. Blood glucose level <200. Denies chest pain, nausea, vomiting but reports persistent headache.  REVIEW OF SYSTEMS:   Constitutional: Negative for fever and chills.  HENT: Negative for congestion and rhinorrhea.  Eyes: Negative for redness and visual disturbance.  Respiratory: Negative for shortness of breath and wheezing.  Cardiovascular: Negative for chest pain and palpitations.  Gastrointestinal: Negative  for nausea , vomiting and abdominal pain and loose stools Genitourinary: Negative for dysuria and urgency but positive for polyuria.  Endocrine: Reports improved polyuria, polydipsia but denies heat/cold intolerance Musculoskeletal: Negative for myalgias and arthralgias.  Skin: Negative for pallor and wound.  Neurological: Negative  for dizziness but positive for headaches   VITAL SIGNS: BP 128/70   Pulse 85   Temp 99.4 F (37.4 C) (Oral)   Resp 17   Ht 5\' 6"  (1.676 m)   Wt 186 lb 8.2 oz (84.6 kg)   LMP 08/21/2015   SpO2 96%   BMI 30.10 kg/m   HEMODYNAMICS:    VENTILATOR SETTINGS:    INTAKE / OUTPUT: I/O last 3 completed shifts: In: 1236.6 [I.V.:1236.6] Out: 50 [Urine:50]  PHYSICAL EXAMINATION: General: Well nourished, well developed, NAD Neuro:  AAO X4, no focal deficits HEENT:[place]/AT, PERRLA, sclera anicterus, oral mucous pink, trachea midline   Cardiovascular: RRR, S1/S2, no MRG Lungs:  ctab Abdomen:  Non-distended, normal BS, No palpable organomegaly Musculoskeletal:  +rom, no deformities Extremities: +2 pulses, no edema Skin:  Warm and dry  LABS:  BMET  Recent Labs Lab 10/06/15 1928 10/07/15 0430  NA 124* 134*  K 4.7 3.4*  CL 95* 106  CO2 15* 18*  BUN 17 16  CREATININE 1.03* 0.66  GLUCOSE 585* 164*    Electrolytes  Recent Labs Lab 10/06/15 1928 10/07/15 0430  CALCIUM 9.9 9.7  MG 2.1  --   PHOS 4.9*  --     CBC  Recent Labs Lab 10/06/15 1928 10/07/15 0430  WBC 11.8* 10.3  HGB 9.8* 9.9*  HCT 32.7* 32.4*  PLT 282 284    Coag's No results for input(s): APTT, INR in the last 168 hours.  Sepsis Markers No results for input(s): LATICACIDVEN, PROCALCITON, O2SATVEN in the last 168 hours.  ABG No results for input(s): PHART, PCO2ART, PO2ART in the last 168 hours.  Liver Enzymes  Recent Labs Lab 10/06/15 1928  AST 13*  ALT  14  ALKPHOS 72  BILITOT 0.8  ALBUMIN 3.9    Cardiac Enzymes  Recent Labs Lab 10/06/15 1928  TROPONINI <0.03    Glucose  Recent Labs Lab 10/06/15 2335 10/07/15 0021 10/07/15 0206 10/07/15 0405 10/07/15 0455 10/07/15 0607  GLUCAP 358* 294* 257* 190* 167* 269*    Imaging No results found.   STUDIES:  None  CULTURES: None  ANTIBIOTICS: None  SIGNIFICANT EVENTS: 08/21>ED with blood glucose of 546>treated and  released 09/02>ED with blood glucose of 585  LINES/TUBES: PIVs  DISCUSSION: 35 yo AA female presenting with recurrent hyperglycemia, mild AKI and hyponatremia. No anion gap  ASSESSMENT / PLAN:  ENDOCRINE A:   Uncontrolled TIDM Mild DKA  P:   Continue blood glucose monitoring and insulin infusion; transition to  SQ insulin per protocol BMP Q6H x3  Diabetes education consult Initiate oral intake once blood glucose is 200mg /dl IV hydration per DKA protocol  CARDIOVASCULAR A:  H/o hypertension P:  Continue home medications Hemodynamics per ICU  RENAL A:   Mild AKI secondary to volume depletion P:   Continue IV hydration and transition to oral hydration as tolerated Trend creatinine Monitor and correct electrolyte imbalances  HEMATOLOGIC A:   Mild anemia P:  Monitor CBC and transfuse for Hg<7 Heparin for VTE prophylaxis  NEUROLOGIC A:   Headache P:   RASS goal: n/a Continue PRN tylenol  Monitor neuro status   Disposition and family update: Transfer to to Hospitalist. Signed out to Dr. Bridgett Larsson at [phone] Patient updated in current treatment plan  Total CCM=35 minutes  [name] [facility] ANP-BC Pulmonary and Critical Care Medicine [facility] Pager [phone] or [phone] 10/07/2015, 7:10 AM   PCCM ATTENDING: I have reviewed pt's initial presentation, consultants notes and hospital database in detail.  The above assessment and plan was formulated under my direction.  In summary: C/O HA No distress Cognition intact Chest clear Reg, no M Obese, soft, +BS Ext warm  Hyperglycemia is much improved. Will transition to Lantus and SSI ANP Tukov spoke with Dr Bridgett Larsson and Hospitalist Service will assume her care  Merton Border, MD;  PCCM service; Mobile [phone]

## 2015-10-07 NOTE — Progress Notes (Signed)
Per Dr Alva Garnet, no Met B needed today, draw in AM.   Do not bolus patient daily with NS, DC this order.  Informed insulin drip is still infusing.  No new orders

## 2015-10-07 NOTE — Progress Notes (Signed)
Per Dr Alva Garnet insulin drip is to infuse with mods by glucostabilizer, including CHO coverage, until 2 hrs after 30 units  lantus administered.  Lantus was administered at approx 0935.  Insulin drip has been DCd in [employer], Probation officer continues to chart on drip as adjustments are made.

## 2015-10-07 NOTE — Progress Notes (Addendum)
Spoke with Diabetes coordinator and Dr Alva Garnet re carb intake in [place] ordered, which does not give amount of bolus for carbs.  Give 3.9 unit bolus for 39 grams carbs eaten at breakfast.  Dr Alva Garnet also stopped D5 NS

## 2015-10-07 NOTE — Progress Notes (Signed)
Pt states she has not had a BM since 8/31 and is requesting something to help her move her bowels. Additionally, pt is complaining of vaginal itching. She states she feels she may be getting a yeast infection. Harless Nakayama, NP notified, orders updated in [facility].

## 2015-10-07 NOTE — Progress Notes (Signed)
When questioned patient reported no urine since Saturday afternoon?  Got patient up to her cabinet toilet and she voided a good amount.  No hat in toilet therefore unable to measure.  Hat has now been placed in toilet for future measurement.

## 2015-10-08 ENCOUNTER — Inpatient Hospital Stay: Payer: Self-pay

## 2015-10-08 LAB — BASIC METABOLIC PANEL
ANION GAP: 9 (ref 5–15)
BUN: 20 mg/dL (ref 6–20)
CHLORIDE: 104 mmol/L (ref 101–111)
CO2: 21 mmol/L — AB (ref 22–32)
CREATININE: 0.86 mg/dL (ref 0.44–1.00)
Calcium: 8.8 mg/dL — ABNORMAL LOW (ref 8.9–10.3)
GFR calc non Af Amer: >60 mL/min (ref >60)
Glucose, Bld: 275 mg/dL — ABNORMAL HIGH (ref 65–99)
POTASSIUM: 3.7 mmol/L (ref 3.5–5.1)
SODIUM: 134 mmol/L — AB (ref 135–145)

## 2015-10-08 LAB — GLUCOSE, CAPILLARY
GLUCOSE-CAPILLARY: 229 mg/dL — AB (ref 65–99)
GLUCOSE-CAPILLARY: 366 mg/dL — AB (ref 65–99)
GLUCOSE-CAPILLARY: 280 mg/dL — AB (ref 65–99)
GLUCOSE-CAPILLARY: 236 mg/dL — AB (ref 65–99)
GLUCOSE-CAPILLARY: 268 mg/dL — AB (ref 65–99)
GLUCOSE-CAPILLARY: 334 mg/dL — AB (ref 65–99)
Glucose-Capillary: 308 mg/dL — ABNORMAL HIGH (ref 65–99)
Glucose-Capillary: 240 mg/dL — ABNORMAL HIGH (ref 65–99)
Glucose-Capillary: 203 mg/dL — ABNORMAL HIGH (ref 65–99)
Glucose-Capillary: 194 mg/dL — ABNORMAL HIGH (ref 65–99)
Glucose-Capillary: 186 mg/dL — ABNORMAL HIGH (ref 65–99)
Glucose-Capillary: 334 mg/dL — ABNORMAL HIGH (ref 65–99)
Glucose-Capillary: 283 mg/dL — ABNORMAL HIGH (ref 65–99)
Glucose-Capillary: 249 mg/dL — ABNORMAL HIGH (ref 65–99)
Glucose-Capillary: 499 mg/dL — ABNORMAL HIGH (ref 65–99)

## 2015-10-08 LAB — GLUCOSE, RANDOM: Glucose, Bld: 461 mg/dL — ABNORMAL HIGH (ref 65–99)

## 2015-10-08 LAB — HEMOGLOBIN A1C: Hgb A1c MFr Bld: 12.2 % — ABNORMAL HIGH (ref 4.0–6.0)

## 2015-10-08 LAB — MAGNESIUM: Magnesium: 2.4 mg/dL (ref 1.7–2.4)

## 2015-10-08 LAB — PHOSPHORUS: PHOSPHORUS: 2.9 mg/dL (ref 2.5–4.6)

## 2015-10-08 MED ORDER — SODIUM CHLORIDE 0.9 % IV SOLN
INTRAVENOUS | Status: DC
  Administered 2015-10-08: 7.4 [IU]/h via INTRAVENOUS
  Administered 2015-10-08: 3.2 [IU]/h via INTRAVENOUS
  Administered 2015-10-08: 6.1 [IU]/h via INTRAVENOUS
  Administered 2015-10-08: 10.2 [IU]/h via INTRAVENOUS
  Administered 2015-10-08: 10.8 [IU]/h via INTRAVENOUS
  Filled 2015-10-08: qty 2.5

## 2015-10-08 MED ORDER — DEXTROSE 50 % IV SOLN: 25.0000 mL | INTRAVENOUS | Status: DC | PRN

## 2015-10-08 MED ORDER — INSULIN ASPART 100 UNIT/ML ~~LOC~~ SOLN
15.0000 [IU] | Freq: Three times a day (TID) | SUBCUTANEOUS | Status: DC
Start: 2015-10-08 — End: 2015-10-09
  Administered 2015-10-08 – 2015-10-09 (×3): 15 [IU] via SUBCUTANEOUS
  Filled 2015-10-08 (×3): qty 15

## 2015-10-08 MED ORDER — IBUPROFEN 600 MG PO TABS
600.0000 mg | ORAL_TABLET | Freq: Four times a day (QID) | ORAL | Status: DC | PRN
  Administered 2015-10-08 – 2015-10-09 (×3): 600 mg via ORAL
  Filled 2015-10-08 (×3): qty 1

## 2015-10-08 MED ORDER — INSULIN GLARGINE 100 UNIT/ML ~~LOC~~ SOLN
50.0000 [IU] | Freq: Every day | SUBCUTANEOUS | Status: DC
  Administered 2015-10-08: 50 [IU] via SUBCUTANEOUS
  Filled 2015-10-08 (×2): qty 0.5

## 2015-10-08 MED ORDER — INSULIN ASPART 100 UNIT/ML ~~LOC~~ SOLN
0.0000 [IU] | SUBCUTANEOUS | Status: DC
  Administered 2015-10-08: 30 [IU] via SUBCUTANEOUS
  Administered 2015-10-08: 11 [IU] via SUBCUTANEOUS
  Administered 2015-10-08: 5 [IU] via SUBCUTANEOUS
  Administered 2015-10-09: 8 [IU] via SUBCUTANEOUS
  Administered 2015-10-09: 11 [IU] via SUBCUTANEOUS
  Administered 2015-10-09 (×2): 8 [IU] via SUBCUTANEOUS
  Filled 2015-10-08: qty 5
  Filled 2015-10-08: qty 30
  Filled 2015-10-08: qty 11
  Filled 2015-10-08: qty 8
  Filled 2015-10-08: qty 11
  Filled 2015-10-08 (×2): qty 8

## 2015-10-08 MED ORDER — DEXTROSE-NACL 5-0.45 % IV SOLN
INTRAVENOUS | Status: DC
  Administered 2015-10-08: 50 mL/h via INTRAVENOUS

## 2015-10-08 MED ORDER — INSULIN GLARGINE 100 UNIT/ML ~~LOC~~ SOLN
65.0000 [IU] | Freq: Every day | SUBCUTANEOUS | Status: DC
  Filled 2015-10-08: qty 0.65

## 2015-10-08 MED ORDER — LISINOPRIL 10 MG PO TABS
10.0000 mg | ORAL_TABLET | ORAL | Status: DC
  Administered 2015-10-09: 10 mg via ORAL
  Filled 2015-10-08: qty 1

## 2015-10-08 MED ORDER — INSULIN REGULAR BOLUS VIA INFUSION
0.0000 [IU] | Freq: Three times a day (TID) | INTRAVENOUS | Status: DC
  Filled 2015-10-08: qty 10

## 2015-10-08 MED ORDER — HYDROCODONE-ACETAMINOPHEN 5-325 MG PO TABS
1.0000 | ORAL_TABLET | Freq: Once | ORAL | Status: AC
  Administered 2015-10-08: 1 via ORAL
  Filled 2015-10-08: qty 1

## 2015-10-08 MED ORDER — SODIUM CHLORIDE 0.9 % IV SOLN
INTRAVENOUS | Status: DC
  Administered 2015-10-08: 5.1 [IU]/h via INTRAVENOUS
  Filled 2015-10-08: qty 2.5

## 2015-10-08 MED ORDER — SODIUM CHLORIDE 0.9 % IV SOLN: INTRAVENOUS | Status: DC

## 2015-10-08 NOTE — Progress Notes (Signed)
Pt has returned from CT.  

## 2015-10-08 NOTE — Progress Notes (Signed)
Patient continues to complain of headache, dilauded .5mg  given IV x3 with no relief. Blood sugars continued to be elevated NON DKA insulin gtt restarted per NP order, complains of nausea relieved by zofran. See [facility] for further update

## 2015-10-08 NOTE — Progress Notes (Signed)
Pt's CBG noted to be 499 mg/dL. Lab called for a verification draw and Dr. Darvin Neighbours notified. Dr. Darvin Neighbours stated to give pt 30 units of novolog now on the sliding scale and add 15 units novolog with meals, along with the sliding scale coverage. Will continue to monitor.

## 2015-10-08 NOTE — Progress Notes (Signed)
[facility] [place] NAME: Jennifer [name]    MR#:  [id number]  DATE OF BIRTH:  21-Oct-1980  SUBJECTIVE:  CHIEF COMPLAINT:   Chief Complaint  Patient presents with  . Hyperglycemia   Continues to have headache. Back on insulin drip due to hyperglycemia  REVIEW OF SYSTEMS:    Review of Systems  Constitutional: Positive for malaise/fatigue. Negative for chills and fever.  HENT: Negative for sore throat.   Eyes: Negative for blurred vision, double vision and pain.  Respiratory: Negative for cough, hemoptysis, shortness of breath and wheezing.   Cardiovascular: Negative for chest pain, palpitations, orthopnea and leg swelling.  Gastrointestinal: Negative for abdominal pain, constipation, diarrhea, heartburn, nausea and vomiting.  Genitourinary: Negative for dysuria and hematuria.  Musculoskeletal: Negative for back pain and joint pain.  Skin: Negative for rash.  Neurological: Positive for headaches. Negative for sensory change, speech change and focal weakness.  Endo/Heme/Allergies: Does not bruise/bleed easily.  Psychiatric/Behavioral: Negative for depression. The patient is not nervous/anxious.     DRUG ALLERGIES:   Allergies  Allergen Reactions  . Demerol [Meperidine] Hives, Itching and Nausea And Vomiting  . Oxycodone Hives, Itching and Nausea And Vomiting  . Toradol [Ketorolac Tromethamine] Hives, Itching and Nausea And Vomiting    VITALS:  Blood pressure 115/72, pulse 86, temperature 98.6 F (37 C), temperature source Oral, resp. rate 14, height 5\' 6"  (1.676 m), weight 88.4 kg (194 lb 14.2 oz), last menstrual period 09/24/2015, SpO2 95 %.  PHYSICAL EXAMINATION:   Physical Exam  GENERAL:  35 y.o.-year-old patient lying in the bed with no acute distress.  EYES: Pupils equal, round, reactive to light and accommodation. No scleral icterus. Extraocular muscles intact.  HEENT: Head atraumatic, normocephalic. Oropharynx and  nasopharynx clear.  NECK:  Supple, no jugular venous distention. No thyroid enlargement, no tenderness.  LUNGS: Normal breath sounds bilaterally, no wheezing, rales, rhonchi. No use of accessory muscles of respiration.  CARDIOVASCULAR: S1, S2 normal. No murmurs, rubs, or gallops.  ABDOMEN: Soft, nontender, nondistended. Bowel sounds present. No organomegaly or mass.  EXTREMITIES: No cyanosis, clubbing or edema b/l.    NEUROLOGIC: Cranial nerves II through XII are intact. No focal Motor or sensory deficits b/l.   PSYCHIATRIC: The patient is alert and oriented x 3.  SKIN: No obvious rash, lesion, or ulcer.   LABORATORY PANEL:   CBC  Recent Labs Lab 10/07/15 0430  WBC 10.3  HGB 9.9*  HCT 32.4*  PLT 284   ------------------------------------------------------------------------------------------------------------------ Chemistries   Recent Labs Lab 10/06/15 1928  10/08/15 0415  NA 124*  < > 134*  K 4.7  < > 3.7  CL 95*  < > 104  CO2 15*  < > 21*  GLUCOSE 585*  < > 275*  BUN 17  < > 20  CREATININE 1.03*  < > 0.86  CALCIUM 9.9  < > 8.8*  MG 2.1  --  2.4  AST 13*  --   --   ALT 14  --   --   ALKPHOS 72  --   --   BILITOT 0.8  --   --   < > = values in this interval not displayed. ------------------------------------------------------------------------------------------------------------------  Cardiac Enzymes  Recent Labs Lab 10/06/15 1928  TROPONINI <0.03   ------------------------------------------------------------------------------------------------------------------  RADIOLOGY:  Ct Head Wo Contrast  Result Date: 10/08/2015 CLINICAL DATA:  35 year old female with a history of headache and nausea EXAM: CT HEAD WITHOUT CONTRAST TECHNIQUE: Contiguous axial  images were obtained from the base of the skull through the vertex without intravenous contrast. COMPARISON:  05/29/2013 FINDINGS: Unremarkable appearance of the calvarium without acute fracture or aggressive  lesion. Unremarkable appearance of the scalp soft tissues. Unremarkable appearance of the bilateral orbits. Mastoid air cells are clear. No significant paranasal sinus disease No acute intracranial hemorrhage, midline shift, or mass effect. Gray-white differentiation is maintained, without CT evidence of acute ischemia. Unremarkable configuration of the ventricles. IMPRESSION: No CT evidence of acute intracranial abnormality. Signed, Dulcy Fanny. Earleen Newport, DO [facility] Electronically Signed   By: Corrie Mckusick D.O.   On: 10/08/2015 09:56     ASSESSMENT AND PLAN:   * Hyperosmolar hyperglycemic state Improved with insulin drip and transitioned to Lantus. Start with 50 units Lantus increased from 30 units given yesterday. Patient takes 24 units at home. Although she was discharged on 80 units from the hospital the last time. Presently on insulin drip. Transitioned off insulin drip 2 hours after Lantus.  * Pseudohyponatremia due to hyperglycemia has resolved  * Acute kidney injury with Hyperkalemia improved with IV fluid hydration  * Hypertension Patient is on lisinopril. Low normal blood pressure. Will decrease from 20 mg to 10 mg.  * Headache Diffuse with no focal neurological deficits. Check CT scan of the head. Ibuprofen when necessary.  * DVT prophylaxis with Lovenox   All the records are reviewed and case discussed with Care Management/Social Workerr. Management plans discussed with the patient, family and they are in agreement.  CODE STATUS: FULL CODE  DVT Prophylaxis: SCDs  TOTAL TIME TAKING CARE OF THIS PATIENT: 35 minutes.   POSSIBLE D/C IN 1-2 DAYS, DEPENDING ON CLINICAL CONDITION.  Hillary Bow R M.D on 10/08/2015 at 11:26 AM  Between 7am to 6pm - Pager - [phone]  After 6pm go to [email or web address] - password [email or web address] [facility]  Office  [phone]  CC: Primary care physician; [name]  Note: This dictation was prepared with Dragon dictation along with smaller phrase technology. Any transcriptional errors that result from this process are unintentional.

## 2015-10-08 NOTE — Progress Notes (Signed)
PULMONARY / CRITICAL CARE MEDICINE   Name: Jennifer [name] MRN: [id number] DOB: [birth year]    ADMISSION DATE:  10/06/2015   REFERRING MD:  Dr. Lenise Arena  CHIEF COMPLAINT:  "my head is aching and I can't get my blood sugar under control"  HISTORY OF PRESENT ILLNESS:   This is a 35 y/o AA female with a h/o IDDM, and hypertension who presents with headache and hyperglycemia x 3 days. She states that her blood glucose meter has been reading high. She is on insulin and metformin and reports taking it as prescribed. Also reports severe thirst and increased urination. She reports poor appetite which she attributes to excessive water intake and and headache. Describes headache a frontal, 10/10 in intensity, can't think of aggravating factors; only relieving factor is morphine. Reports having headaches when blood glucose is high. She denies fever, chills, nausea and vomiting. No recent illness.  At the ED, her blood glucose level was >500mg /dl, sodium 129, and creatinine 1.03(baseline 0.66). Patient is being admitted for glucose stabilization and volume replacement. She was in the ED in August with similar symptoms.   SUBJECTIVE: c/o headache overnight. No relief with dilaudid and tylenol. Blood glucose level >400; insulin gtte restarted. Denies chest pain, nausea, vomiting but reports persistent headache.  REVIEW OF SYSTEMS:   Constitutional: Negative for fever and chills.  HENT: Negative for congestion and rhinorrhea.  Eyes: Negative for redness and visual disturbance.  Respiratory: Negative for shortness of breath and wheezing.  Cardiovascular: Negative for chest pain and palpitations.  Gastrointestinal: Negative  for nausea , vomiting and abdominal pain and loose stools Genitourinary: Negative for dysuria and urgency but positive for polyuria.  Endocrine: Reports improved polyuria, polydipsia but denies heat/cold intolerance Musculoskeletal: Negative for myalgias and arthralgias.   Skin: Negative for pallor and wound.  Neurological: Negative for dizziness but positive for headaches   VITAL SIGNS: BP 102/65   Pulse 86   Temp 98.6 F (37 C) (Oral)   Resp 14   Ht 5\' 6"  (1.676 m)   Wt 194 lb 14.2 oz (88.4 kg)   LMP 08/21/2015   SpO2 95%   BMI 31.46 kg/m   HEMODYNAMICS:    VENTILATOR SETTINGS:    INTAKE / OUTPUT: I/O last 3 completed shifts: In: 2126.6 [P.O.:592; I.V.:1534.6] Out: 50 [Urine:50]  PHYSICAL EXAMINATION: General: Well nourished, well developed, NAD Neuro:  AAO X4, no focal deficits HEENT:Ester/AT, PERRLA, sclera anicterus, oral mucous pink, trachea midline   Cardiovascular: RRR, S1/S2, no MRG Lungs:  ctab Abdomen:  Non-distended, normal BS, No palpable organomegaly Musculoskeletal:  +rom, no deformities Extremities: +2 pulses, no edema Skin:  Warm and dry  LABS:  BMET  Recent Labs Lab 10/06/15 1928 10/07/15 0430 10/08/15 0415  NA 124* 134* 134*  K 4.7 3.4* 3.7  CL 95* 106 104  CO2 15* 18* 21*  BUN 17 16 20   CREATININE 1.03* 0.66 0.86  GLUCOSE 585* 164* 275*    Electrolytes  Recent Labs Lab 10/06/15 1928 10/07/15 0430 10/08/15 0415  CALCIUM 9.9 9.7 8.8*  MG 2.1  --  2.4  PHOS 4.9*  --  2.9    CBC  Recent Labs Lab 10/06/15 1928 10/07/15 0430  WBC 11.8* 10.3  HGB 9.8* 9.9*  HCT 32.7* 32.4*  PLT 282 284    Coag's No results for input(s): APTT, INR in the last 168 hours.  Sepsis Markers No results for input(s): LATICACIDVEN, PROCALCITON, O2SATVEN in the last 168 hours.  ABG No results for  input(s): PHART, PCO2ART, PO2ART in the last 168 hours.  Liver Enzymes  Recent Labs Lab 10/06/15 1928  AST 13*  ALT 14  ALKPHOS 72  BILITOT 0.8  ALBUMIN 3.9    Cardiac Enzymes  Recent Labs Lab 10/06/15 1928  TROPONINI <0.03    Glucose  Recent Labs Lab 10/07/15 2348 10/08/15 0205 10/08/15 0304 10/08/15 0420 10/08/15 0507 10/08/15 0606  GLUCAP 377* 366* 308* 280* 236* 240*    Imaging No  results found.   STUDIES:  None  CULTURES: None  ANTIBIOTICS: None  SIGNIFICANT EVENTS: 08/21>ED with blood glucose of 546>treated and released 09/02>ED with blood glucose of 585  LINES/TUBES: PIVs  DISCUSSION: 35 yo AA female presenting with recurrent hyperglycemia, mild AKI and hyponatremia. No anion gap  ASSESSMENT / PLAN:  ENDOCRINE A:   Uncontrolled TIDM Mild DKA  P:   Continue blood glucose monitoring and insulin infusion; transition to  SQ insulin per protocol BMP daily Diabetes education consult Oral hydration as tolerated Continue maintenance fluids  CARDIOVASCULAR A:  H/o hypertension P:  Continue home medications Hemodynamics per ICU  RENAL A:   Mild AKI secondary to volume depletion-resolved P:   Continue IV hydration oral hydration as tolerated Trend creatinine Monitor and correct electrolyte imbalances  HEMATOLOGIC A:   Mild anemia P:  Monitor CBC and transfuse for Hg<7 Heparin for VTE prophylaxis  NEUROLOGIC A:   Headache P:   RASS goal: n/a Continue PRN tylenol  Monitor neuro status   Disposition and family update: Transfer to to [facility] 09/04. Signed out to Dr. Bridgett Larsson at [facility] 10/07/15.  Patient updated in current treatment plan  Total CCM=35 minutes  [name] [facility] ANP-BC Pulmonary and [place] Pager [phone] or [phone] 10/08/2015, 6:53 AM  Pt. Seen and examined with NP, agree with assessment and plan. 35 yo AA female presenting with recurrent hyperglycemia, mild AKI and hyponatremia. No anion gap, doing better, pt will be transferred to hospitalist service.   [name], M.D. 10/08/2015

## 2015-10-08 NOTE — Care Management Note (Addendum)
Case Management Note  Patient Details  Name: Jennifer [name] MRN: [id number] Date of Birth: 1980-09-15  Subjective/Objective:                  Met with patient to discuss discharge planning. She is self-pay. She sometimes has trouble paying for her medications even thought she is established with [facility]. She lives with her husband. She is independent and able to drive.   Action/Plan: Application to Medication management shared with patient. I encouraged her to speak with someone at [name] when she had problems with cost of medications. Medication Management may also be able to assist her as they are sister-companies. No identified RNCM needs at this time.   Expected Discharge Date:                  Expected Discharge Plan:     In-House Referral:     Discharge planning Services  CM Consult  Post Acute Care Choice:    Choice offered to:     DME Arranged:    DME Agency:     HH Arranged:    HH Agency:     Status of Service:  In process, will continue to follow  If discussed at Long Length of Stay Meetings, dates discussed:    Additional Comments:  Marshell Garfinkel, RN 10/08/2015, 10:20 AM

## 2015-10-08 NOTE — Progress Notes (Signed)
Pt to CT

## 2015-10-08 NOTE — Progress Notes (Signed)
Inpatient Diabetes Program Recommendations  AACE/ADA: New Consensus Statement on Inpatient Glycemic Control (2015)  Target Ranges:  Prepandial:   less than 140 mg/dL      Peak postprandial:   less than 180 mg/dL (1-2 hours)      Critically ill patients:  140 - 180 mg/dL   Lab Results  Component Value Date   GLUCAP 186 (H) 10/08/2015   HGBA1C 14.3 (H) 11/13/2014    Review of Glycemic Control  Diabetes history: DM2 Outpatient Diabetes medications: Levemir 80 units qd +Novolog 20 units tid meal coverage+ Metformin 2 gm qd Current orders for Inpatient glycemic control: Lantus 65 units to be given prior to IV insulin drip discontinued + 0-10 units Novolog meal coverage while on insulin drip  Inpatient Diabetes Program Recommendations:  Please give Lantus 2 hrs prior to D/C of IV insulin and cover CBG when IV insulin discontinued. Noted A1c pending. Recommend: -Novolog 10 units tid meal coverage and may need to increase depending on carbohydrates -Novolog correction 0-15 units tid with meals+ 0-5 units hs -Will follow while in the hospital -Refer to Dr. Joycie Peek note on 11/15/14 and noted changes in insulin in home regimen.  Thank you, Nani Gasser. Ervin Hensley, RN, MSN, CDE Inpatient Glycemic Control Team Team Pager [phone] (8am-5pm) 10/08/2015 9:09 AM

## 2015-10-09 LAB — GLUCOSE, CAPILLARY
GLUCOSE-CAPILLARY: 256 mg/dL — AB (ref 65–99)
GLUCOSE-CAPILLARY: 264 mg/dL — AB (ref 65–99)
GLUCOSE-CAPILLARY: 278 mg/dL — AB (ref 65–99)
GLUCOSE-CAPILLARY: 346 mg/dL — AB (ref 65–99)
Glucose-Capillary: 316 mg/dL — ABNORMAL HIGH (ref 65–99)
Glucose-Capillary: 258 mg/dL — ABNORMAL HIGH (ref 65–99)

## 2015-10-09 LAB — C-PEPTIDE: C-Peptide: 1.7 ng/mL (ref 1.1–4.4)

## 2015-10-09 MED ORDER — BUTALBITAL-APAP-CAFFEINE 50-325-40 MG PO TABS: 1.0000 | ORAL_TABLET | Freq: Four times a day (QID) | ORAL | 0 refills | Status: DC | PRN

## 2015-10-09 MED ORDER — INSULIN GLARGINE 100 UNIT/ML ~~LOC~~ SOLN
60.0000 [IU] | Freq: Every day | SUBCUTANEOUS | Status: DC
  Administered 2015-10-09: 60 [IU] via SUBCUTANEOUS
  Filled 2015-10-09: qty 0.6

## 2015-10-09 MED ORDER — DIPHENHYDRAMINE HCL 25 MG PO CAPS
25.0000 mg | ORAL_CAPSULE | Freq: Once | ORAL | Status: AC
  Administered 2015-10-09: 25 mg via ORAL
  Filled 2015-10-09: qty 1

## 2015-10-09 MED ORDER — BUTALBITAL-APAP-CAFFEINE 50-325-40 MG PO TABS
1.0000 | ORAL_TABLET | Freq: Four times a day (QID) | ORAL | Status: DC | PRN
  Administered 2015-10-09: 1 via ORAL
  Filled 2015-10-09: qty 1

## 2015-10-09 MED ORDER — INSULIN GLARGINE 100 UNIT/ML ~~LOC~~ SOLN: 50.0000 [IU] | Freq: Every day | SUBCUTANEOUS | 11 refills | Status: DC

## 2015-10-09 NOTE — Progress Notes (Signed)
Discharge instructions given to and reviewed with patient.  Patient verbalized understanding of all discharge instructions including change in insulin and to pick up new prescription of lantus. Patient wheeled down to visitors entrance with this RN.  Patient ambulated to car and left with her father driving and no distress noted.

## 2015-10-09 NOTE — Progress Notes (Addendum)
Inpatient Diabetes Program Recommendations  AACE/ADA: New Consensus Statement on Inpatient Glycemic Control (2015)  Target Ranges:  Prepandial:   less than 140 mg/dL      Peak postprandial:   less than 180 mg/dL (1-2 hours)      Critically ill patients:  140 - 180 mg/dL   Results for Jennifer [name], Jennifer [name] (MRN [id number]) as of 10/09/2015 08:48  Ref. Range 10/08/2015 07:56 10/08/2015 08:57 10/08/2015 10:01 10/08/2015 10:59 10/08/2015 11:58 10/08/2015 12:58 10/08/2015 15:52 10/08/2015 19:56 10/09/2015 00:06 10/09/2015 05:32 10/09/2015 07:31  Glucose-Capillary Latest Ref Range: 65 - 99 mg/dL 194 (H) 186 (H) 268 (H) 334 (H) 283 (H) 249 (H) 499 (H) 334 (H) 346 (H) 278 (H) 256 (H)   Review of Glycemic Control  Diabetes history: DM2 Outpatient Diabetes medications: Lantus 80 units daily, Novolog 20 units TID with meals, Metformin 2000 mg daily Current orders for Inpatient glycemic control: Lantus 60 units daily, Novolog 15 units TID with meals for meal coverage, Novolog 0-15 units Q4H  Inpatient Diabetes Program Recommendations: Insulin - Basal: Patient received Lantus 50 units on 10/08/15 at 10:06 am and has received a total of Novolog 63 units for correction since reciving the Lantus on 10/08/15. Noted Lantus as increased to 60 units daily. Please consider increasing Lantus to 70 units daily. Insulin - Meal Coverage: Please consider increasing meal coverage to Novolog 20 units TID with meals for meal coverage if patient eats at least 50% of meal.  Addendum 10/09/15@12 :27-Spoke with patient about diabetes and home regimen for diabetes control. Patient reports that she is followed by [facility] for diabetes management and currently she takes Lantus 80 units daily, Novolog 20 units TID with meals, and Metformin 2000 mg daily as an outpatient for diabetes control. Patient reports that she is paying out of pocket for medications at the clinic. Patient reports that she is taking insulin as prescribed.  Patient states that she  checks her glucose 2-3 times per day and that it has been running high over the past 2 months and over the past week her glucose is reading "HI" on the glucometer. Patient reports that she has not made any changes over the past 2 weeks and has no idea why her glucose is running higher than usual. Discussed glucose and A1C goals. Discussed importance of checking CBGs and maintaining good CBG control to prevent long-term and short-term complications. Explained how hyperglycemia leads to damage within blood vessels which lead to the common complications seen with uncontrolled diabetes. Stressed to the patient the importance of improving glycemic control to prevent further complications from uncontrolled diabetes. Discussed impact of nutrition, exercise, stress, sickness, and medications on diabetes control. Discussed insulin and administration. Patient reports that she mainly uses her abdomen and back of arms for injecting insulin and she is rotating injection sites.  Encouraged patient to check her glucose 3-4 times per day (before meals and at bedtime) and to keep a log book of glucose readings and insulin taken which she will need to take to doctor appointments. Explained how the doctor she follows up with can use the log book to continue to make insulin adjustments if needed. Encouraged patient to follow up with [facility] regarding improving glycemic control. Patient verbalized understanding of information discussed and she states that she has no further questions at this time related to diabetes.  Thanks, Barnie Alderman, RN, MSN, CDE Diabetes Coordinator Inpatient Diabetes Program [phone] (Team Pager) [phone] ([facility] office) [phone] [facility] office) [phone] [facility] office)

## 2015-10-09 NOTE — Progress Notes (Signed)
Patient complains of continued headache unrelieved by tylenol or motrin.  Patient stated the only med that has helped is morphine.  RN made Dr. Anselm Jungling aware of all mentioned above and MD stated he would place some orders.

## 2015-10-10 NOTE — Discharge Summary (Signed)
[place] [facility] [place] NAME: Jennifer [name]    MR#:  [id number]  DATE OF BIRTH:  1980-07-12  DATE OF ADMISSION:  10/06/2015 ADMITTING PHYSICIAN: Wilhelmina Mcardle, MD  DATE OF DISCHARGE: 10/09/2015  1:56 PM  PRIMARY CARE PHYSICIAN: [name]    ADMISSION DIAGNOSIS:  Diabetic ketoacidosis without coma associated with other specified diabetes mellitus ([place]) [E13.10]  DISCHARGE DIAGNOSIS:  Active Problems:   Diabetic ketoacidosis associated with type 1 diabetes mellitus ([place])   SECONDARY DIAGNOSIS:   Past Medical History:  Diagnosis Date  . Diabetes mellitus without complication ([place])   . Hypertension     HOSPITAL COURSE:   * Hyperosmolar hyperglycemic state Improved with insulin drip and transitioned to Lantus. She was advised to follow with PMD after 1 week, as we adjusted dose of insulin she was taking at home.  * Pseudohyponatremia due to hyperglycemia has resolved  * Acute kidney injury with Hyperkalemia improved with IV fluid hydration  * Hypertension Patient is on lisinopril. Low normal blood pressure. Will decrease from 20 mg to 10 mg.  * Headache Diffuse with no focal neurological deficits. Check CT scan of the head. Ibuprofen when necessary.  * DVT prophylaxis with Lovenox   DISCHARGE CONDITIONS:   Stable.  CONSULTS OBTAINED:  Treatment Team:  Vaughan Basta, MD  DRUG ALLERGIES:   Allergies  Allergen Reactions  . Demerol [Meperidine] Hives, Itching and Nausea And Vomiting  . Oxycodone Hives, Itching and Nausea And Vomiting  . Toradol [Ketorolac Tromethamine] Hives, Itching and Nausea And Vomiting    DISCHARGE MEDICATIONS:   Discharge Medication List as of 10/09/2015 10:29 AM    START taking these medications   Details  butalbital-acetaminophen-caffeine (FIORICET, ESGIC) 50-325-40 MG tablet Take 1 tablet by mouth every 6 (six) hours as needed for headache or migraine.,  Starting Tue 10/09/2015, Print      CONTINUE these medications which have CHANGED   Details  insulin glargine (LANTUS) 100 UNIT/ML injection Inject 0.5 mLs (50 Units total) into the skin daily., Starting Tue 10/09/2015, Normal      CONTINUE these medications which have NOT CHANGED   Details  insulin aspart (NOVOLOG) 100 UNIT/ML injection Inject 20 Units into the skin 3 (three) times daily with meals., Starting Wed 11/15/2014, Print    lisinopril (PRINIVIL,ZESTRIL) 20 MG tablet Take 20 mg by mouth every morning., Historical Med    metFORMIN (GLUCOPHAGE) 1000 MG tablet Take 0.5 tablets (500 mg total) by mouth 2 (two) times daily., Starting Wed 11/15/2014, No Print      STOP taking these medications     insulin detemir (LEVEMIR) 100 UNIT/ML injection          DISCHARGE INSTRUCTIONS:    Follow with PMD in 1 week.  If you experience worsening of your admission symptoms, develop shortness of breath, life threatening emergency, suicidal or homicidal thoughts you must seek medical attention immediately by calling 911 or calling your MD immediately  if symptoms less severe.  You Must read complete instructions/literature along with all the possible adverse reactions/side effects for all the Medicines you take and that have been prescribed to you. Take any new Medicines after you have completely understood and accept all the possible adverse reactions/side effects.   Please note  You were cared for by a hospitalist during your hospital stay. If you have any questions about your discharge medications or the care you received while you were in the hospital after you are  discharged, you can call the unit and asked to speak with the hospitalist on call if the hospitalist that took care of you is not available. Once you are discharged, your primary care physician will handle any further medical issues. Please note that NO REFILLS for any discharge medications will be authorized once you are  discharged, as it is imperative that you return to your primary care physician (or establish a relationship with a primary care physician if you do not have one) for your aftercare needs so that they can reassess your need for medications and monitor your lab values.    Today   CHIEF COMPLAINT:   Chief Complaint  Patient presents with  . Hyperglycemia    HISTORY OF PRESENT ILLNESS:  Jennifer [name]  is a 35 y.o. female with a known history of  IDDM, and hypertension who presents with headache and hyperglycemia x 3 days. She states that her blood glucose meter has been reading high. She is on insulin and metformin and reports taking it as prescribed. Also reports severe thirst and increased urination. She reports poor appetite which she attributes to excessive water intake and and headache. Describes headache a frontal, 10/10 in intensity, can't think of aggravating factors; only relieving factor is morphine. Reports having headaches when blood glucose is high. She denies fever, chills, nausea and vomiting. No recent illness.  At the ED, her blood glucose level was >500mg /dl, sodium 129, and creatinine 1.03(baseline 0.66). Patient is being admitted for glucose stabilization and volume replacement. She was in the ED in August with similar symptoms.   VITAL SIGNS:  Blood pressure 139/79, pulse 89, temperature 98 F (36.7 C), temperature source Oral, resp. rate 18, height 5\' 6"  (1.676 m), weight 90.6 kg (199 lb 11.8 oz), last menstrual period 09/24/2015, SpO2 99 %.  I/O:  No intake or output data in the 24 hours ending 10/10/15 1628  PHYSICAL EXAMINATION:   GENERAL:  35 y.o.-year-old patient lying in the bed with no acute distress.  EYES: Pupils equal, round, reactive to light and accommodation. No scleral icterus. Extraocular muscles intact.  HEENT: Head atraumatic, normocephalic. Oropharynx and nasopharynx clear.  NECK:  Supple, no jugular venous distention. No thyroid enlargement, no  tenderness.  LUNGS: Normal breath sounds bilaterally, no wheezing, rales, rhonchi. No use of accessory muscles of respiration.  CARDIOVASCULAR: S1, S2 normal. No murmurs, rubs, or gallops.  ABDOMEN: Soft, nontender, nondistended. Bowel sounds present. No organomegaly or mass.  EXTREMITIES: No cyanosis, clubbing or edema b/l.    NEUROLOGIC: Cranial nerves II through XII are intact. No focal Motor or sensory deficits b/l.   PSYCHIATRIC: The patient is alert and oriented x 3.  SKIN: No obvious rash, lesion, or ulcer.   DATA REVIEW:   CBC  Recent Labs Lab 10/07/15 0430  WBC 10.3  HGB 9.9*  HCT 32.4*  PLT 284    Chemistries   Recent Labs Lab 10/06/15 1928  10/08/15 0415 10/08/15 1635  NA 124*  < > 134*  --   K 4.7  < > 3.7  --   CL 95*  < > 104  --   CO2 15*  < > 21*  --   GLUCOSE 585*  < > 275* 461*  BUN 17  < > 20  --   CREATININE 1.03*  < > 0.86  --   CALCIUM 9.9  < > 8.8*  --   MG 2.1  --  2.4  --   AST 13*  --   --   --  ALT 14  --   --   --   ALKPHOS 72  --   --   --   BILITOT 0.8  --   --   --   < > = values in this interval not displayed.  Cardiac Enzymes  Recent Labs Lab 10/06/15 1928  TROPONINI <0.03    Microbiology Results  Results for orders placed or performed during the hospital encounter of 10/06/15  MRSA PCR Screening     Status: None   Collection Time: 10/07/15  5:53 AM  Result Value Ref Range Status   MRSA by PCR NEGATIVE NEGATIVE Final    Comment:        The GeneXpert MRSA Assay (FDA approved for NASAL specimens only), is one component of a comprehensive MRSA colonization surveillance program. It is not intended to diagnose MRSA infection nor to guide or monitor treatment for MRSA infections.     RADIOLOGY:  No results found.  EKG:   Orders placed or performed during the hospital encounter of 10/06/15  . ED EKG  . ED EKG  . EKG 12-Lead  . EKG 12-Lead      Management plans discussed with the patient, family and they  are in agreement.  CODE STATUS:  Code Status History    Date Active Date Inactive Code Status Order ID Comments User Context   10/06/2015 11:14 PM 10/09/2015  5:01 PM Full Code [id number]  Mikael Spray, NP ED   11/13/2014  9:19 PM 11/15/2014  4:18 PM Full Code [id number]  Dustin Flock, MD Inpatient      TOTAL TIME TAKING CARE OF THIS PATIENT: 35 minutes.    Vaughan Basta M.D on 10/10/2015 at 4:28 PM  Between 7am to 6pm - Pager - [phone]  After 6pm go to [email or web address] - password [email or web address] [facility]  Office  [phone]  CC: Primary care physician; [name]   Note: This dictation was prepared with Dragon dictation along with smaller phrase technology. Any transcriptional errors that result from this process are unintentional.

## 2015-10-21 ENCOUNTER — Encounter: Payer: Self-pay | Admitting: Emergency Medicine

## 2015-10-21 ENCOUNTER — Inpatient Hospital Stay
Admission: EM | Admit: 2015-10-21 | Discharge: 2015-10-25 | DRG: 639 | Disposition: A | Discharge status: Home / Self Care | Payer: Self-pay | Attending: Internal Medicine | Admitting: Internal Medicine

## 2015-10-21 DIAGNOSIS — Z87891 Personal history of nicotine dependence: Secondary | ICD-10-CM

## 2015-10-21 DIAGNOSIS — K5900 Constipation, unspecified: Secondary | ICD-10-CM | POA: Diagnosis present

## 2015-10-21 DIAGNOSIS — E1100 Type 2 diabetes mellitus with hyperosmolarity without nonketotic hyperglycemic-hyperosmolar coma (NKHHC): Principal | ICD-10-CM | POA: Diagnosis present

## 2015-10-21 DIAGNOSIS — Z833 Family history of diabetes mellitus: Secondary | ICD-10-CM

## 2015-10-21 DIAGNOSIS — R519 Headache, unspecified: Secondary | ICD-10-CM

## 2015-10-21 DIAGNOSIS — G4733 Obstructive sleep apnea (adult) (pediatric): Secondary | ICD-10-CM | POA: Diagnosis present

## 2015-10-21 DIAGNOSIS — E669 Obesity, unspecified: Secondary | ICD-10-CM | POA: Diagnosis present

## 2015-10-21 DIAGNOSIS — R51 Headache: Secondary | ICD-10-CM | POA: Diagnosis present

## 2015-10-21 DIAGNOSIS — R739 Hyperglycemia, unspecified: Secondary | ICD-10-CM

## 2015-10-21 DIAGNOSIS — Z794 Long term (current) use of insulin: Secondary | ICD-10-CM

## 2015-10-21 DIAGNOSIS — E876 Hypokalemia: Secondary | ICD-10-CM | POA: Diagnosis present

## 2015-10-21 DIAGNOSIS — Z8249 Family history of ischemic heart disease and other diseases of the circulatory system: Secondary | ICD-10-CM

## 2015-10-21 DIAGNOSIS — I10 Essential (primary) hypertension: Secondary | ICD-10-CM | POA: Diagnosis present

## 2015-10-21 DIAGNOSIS — Z885 Allergy status to narcotic agent status: Secondary | ICD-10-CM

## 2015-10-21 DIAGNOSIS — Z79899 Other long term (current) drug therapy: Secondary | ICD-10-CM

## 2015-10-21 HISTORY — DX: Hyperglycemia, unspecified: R73.9

## 2015-10-21 LAB — URINALYSIS COMPLETE WITH MICROSCOPIC (ARMC ONLY)
BACTERIA UA: NONE SEEN
Bilirubin Urine: NEGATIVE
Glucose, UA: >500 mg/dL — AB
Hgb urine dipstick: NEGATIVE
LEUKOCYTES UA: NEGATIVE
Nitrite: NEGATIVE
PH: 6 (ref 5.0–8.0)
PROTEIN: NEGATIVE mg/dL
Specific Gravity, Urine: 1.024 (ref 1.005–1.030)

## 2015-10-21 LAB — BLOOD GAS, VENOUS
Acid-base deficit: 3.6 mmol/L — ABNORMAL HIGH (ref 0.0–2.0)
BICARBONATE: 21.5 mmol/L (ref 20.0–28.0)
O2 Saturation: 88.5 %
PATIENT TEMPERATURE: 37
PH VEN: 7.36 (ref 7.250–7.430)
PO2 VEN: 58 mmHg — AB (ref 32.0–45.0)
pCO2, Ven: 38 mmHg — ABNORMAL LOW (ref 44.0–60.0)

## 2015-10-21 LAB — BASIC METABOLIC PANEL
Anion gap: 12 (ref 5–15)
BUN: 15 mg/dL (ref 6–20)
CHLORIDE: 90 mmol/L — AB (ref 101–111)
CO2: 22 mmol/L (ref 22–32)
CREATININE: 1.04 mg/dL — AB (ref 0.44–1.00)
Calcium: 9.9 mg/dL (ref 8.9–10.3)
GFR calc non Af Amer: >60 mL/min (ref >60)
Glucose, Bld: 820 mg/dL (ref 65–99)
POTASSIUM: 4.3 mmol/L (ref 3.5–5.1)
Sodium: 124 mmol/L — ABNORMAL LOW (ref 135–145)

## 2015-10-21 LAB — CBC
HEMATOCRIT: 33.2 % — AB (ref 35.0–47.0)
HEMOGLOBIN: 10.1 g/dL — AB (ref 12.0–16.0)
MCH: 20.6 pg — AB (ref 26.0–34.0)
MCHC: 30.4 g/dL — ABNORMAL LOW (ref 32.0–36.0)
MCV: 67.9 fL — AB (ref 80.0–100.0)
PLATELETS: 273 10*3/uL (ref 150–440)
RBC: 4.89 MIL/uL (ref 3.80–5.20)
RDW: 20.7 % — ABNORMAL HIGH (ref 11.5–14.5)
WBC: 9 10*3/uL (ref 3.6–11.0)

## 2015-10-21 LAB — GLUCOSE, CAPILLARY
GLUCOSE-CAPILLARY: 562 mg/dL — AB (ref 65–99)
GLUCOSE-CAPILLARY: 264 mg/dL — AB (ref 65–99)
GLUCOSE-CAPILLARY: 217 mg/dL — AB (ref 65–99)
Glucose-Capillary: 500 mg/dL — ABNORMAL HIGH (ref 65–99)
Glucose-Capillary: 344 mg/dL — ABNORMAL HIGH (ref 65–99)
Glucose-Capillary: 310 mg/dL — ABNORMAL HIGH (ref 65–99)
Glucose-Capillary: 196 mg/dL — ABNORMAL HIGH (ref 65–99)
Glucose-Capillary: 161 mg/dL — ABNORMAL HIGH (ref 65–99)

## 2015-10-21 LAB — OSMOLALITY: Osmolality: 312 mOsm/kg — ABNORMAL HIGH (ref 275–295)

## 2015-10-21 MED ORDER — LISINOPRIL 20 MG PO TABS
20.0000 mg | ORAL_TABLET | ORAL | Status: DC
  Administered 2015-10-22 – 2015-10-24 (×3): 20 mg via ORAL
  Filled 2015-10-21 (×3): qty 1

## 2015-10-21 MED ORDER — SODIUM CHLORIDE 0.9 % IV BOLUS (SEPSIS)
1000.0000 mL | Freq: Once | INTRAVENOUS | Status: AC
  Administered 2015-10-21: 1000 mL via INTRAVENOUS

## 2015-10-21 MED ORDER — INSULIN ASPART 100 UNIT/ML ~~LOC~~ SOLN
10.0000 [IU] | Freq: Once | SUBCUTANEOUS | Status: AC
  Administered 2015-10-21: 10 [IU] via INTRAVENOUS
  Filled 2015-10-21: qty 10

## 2015-10-21 MED ORDER — MORPHINE SULFATE (PF) 2 MG/ML IV SOLN
2.0000 mg | INTRAVENOUS | Status: DC | PRN
  Administered 2015-10-21 – 2015-10-22 (×4): 2 mg via INTRAVENOUS
  Filled 2015-10-21 (×5): qty 1

## 2015-10-21 MED ORDER — MORPHINE SULFATE (PF) 4 MG/ML IV SOLN
4.0000 mg | Freq: Once | INTRAVENOUS | Status: AC
  Administered 2015-10-21: 4 mg via INTRAVENOUS
  Filled 2015-10-21: qty 1

## 2015-10-21 MED ORDER — BUTALBITAL-APAP-CAFFEINE 50-325-40 MG PO TABS
1.0000 | ORAL_TABLET | Freq: Four times a day (QID) | ORAL | Status: DC | PRN
  Administered 2015-10-21: 1 via ORAL
  Filled 2015-10-21: qty 1

## 2015-10-21 MED ORDER — SODIUM CHLORIDE 0.9 % IV SOLN
INTRAVENOUS | Status: DC
  Administered 2015-10-21: 4.4 [IU]/h via INTRAVENOUS
  Filled 2015-10-21: qty 2.5

## 2015-10-21 MED ORDER — ONDANSETRON HCL 4 MG/2ML IJ SOLN
4.0000 mg | Freq: Four times a day (QID) | INTRAMUSCULAR | Status: DC | PRN
  Administered 2015-10-21 – 2015-10-25 (×7): 4 mg via INTRAVENOUS
  Filled 2015-10-21 (×7): qty 2

## 2015-10-21 MED ORDER — SODIUM CHLORIDE 0.9 % IV BOLUS (SEPSIS)
1000.0000 mL | Freq: Once | INTRAVENOUS | Status: AC
Start: 2015-10-21 — End: 2015-10-21
  Administered 2015-10-21: 1000 mL via INTRAVENOUS

## 2015-10-21 MED ORDER — ONDANSETRON HCL 4 MG/2ML IJ SOLN
4.0000 mg | Freq: Once | INTRAMUSCULAR | Status: AC
  Administered 2015-10-21: 4 mg via INTRAVENOUS
  Filled 2015-10-21: qty 2

## 2015-10-21 MED ORDER — INSULIN GLARGINE 100 UNIT/ML ~~LOC~~ SOLN
25.0000 [IU] | Freq: Every day | SUBCUTANEOUS | Status: DC
  Administered 2015-10-21: 25 [IU] via SUBCUTANEOUS
  Filled 2015-10-21 (×2): qty 0.25

## 2015-10-21 MED ORDER — INSULIN GLARGINE 100 UNIT/ML ~~LOC~~ SOLN
50.0000 [IU] | Freq: Every day | SUBCUTANEOUS | Status: DC
  Filled 2015-10-21: qty 0.5

## 2015-10-21 MED ORDER — FLUCONAZOLE 100 MG PO TABS
150.0000 mg | ORAL_TABLET | Freq: Once | ORAL | Status: AC
  Administered 2015-10-21: 150 mg via ORAL
  Filled 2015-10-21: qty 2

## 2015-10-21 MED ORDER — HEPARIN SODIUM (PORCINE) 5000 UNIT/ML IJ SOLN
5000.0000 [IU] | Freq: Three times a day (TID) | INTRAMUSCULAR | Status: DC
  Administered 2015-10-21 – 2015-10-24 (×9): 5000 [IU] via SUBCUTANEOUS
  Filled 2015-10-21 (×9): qty 1

## 2015-10-21 NOTE — Care Management (Signed)
Jennifer [name] is not feeling well. We have a history with one another as she has been in our care before. We talked for awhile and I will visit with her while on rounds again. Right now she needs her rest.

## 2015-10-21 NOTE — ED Provider Notes (Signed)
[facility] Emergency Department Provider Note   ____________________________________________   First MD Initiated Contact with Patient 10/21/15 1515     (approximate)  I have reviewed the triage vital signs and the nursing notes.   HISTORY  Chief Complaint No chief complaint on file. Elevated blood sugar   HPI Jennifer [name] is a 35 y.o. female history of diabetes, relatively brittle. Patient reports she was admitted to the ICU 2 weeks ago, was discharged and since leaving her glucose is been measuring "high" every day except for the first 3 days. She reports feeling weak, tired, and maybe a little confused. She reports very dry mouth and urinating all the time, constantly drinking over 12 bottles of water a day.  Reports compliant with her blood sugar checks. Also reports that she is using her insulin and oral medications as prescribed when she left the hospital.   Past Medical History:  Diagnosis Date  . Diabetes mellitus without complication ([place])   . Hypertension     Patient Active Problem List   Diagnosis Date Noted  . Diabetic ketoacidosis associated with type 1 diabetes mellitus ([place]) 10/06/2015  . Hyperglycemia 11/13/2014    Past Surgical History:  Procedure Laterality Date  . none      Prior to Admission medications   Medication Sig Start Date End Date Taking? Authorizing Provider  butalbital-acetaminophen-caffeine (FIORICET, ESGIC) 50-325-40 MG tablet Take 1 tablet by mouth every 6 (six) hours as needed for headache or migraine. 10/09/15   Vaughan Basta, MD  insulin aspart (NOVOLOG) 100 UNIT/ML injection Inject 20 Units into the skin 3 (three) times daily with meals. 11/15/14   Srikar Sudini, MD  insulin glargine (LANTUS) 100 UNIT/ML injection Inject 0.5 mLs (50 Units total) into the skin daily. 10/09/15   Vaughan Basta, MD  lisinopril (PRINIVIL,ZESTRIL) 20 MG tablet Take 20 mg by mouth every morning.    Historical  Provider, MD  metFORMIN (GLUCOPHAGE) 1000 MG tablet Take 0.5 tablets (500 mg total) by mouth 2 (two) times daily. Patient taking differently: Take 2,000 mg by mouth 2 (two) times daily.  11/15/14   Hillary Bow, MD    Allergies Demerol [meperidine]; Oxycodone; and Toradol [ketorolac tromethamine]  Family History  Problem Relation Age of Onset  . Diabetes    . Hypertension      Social History Social History  Substance Use Topics  . Smoking status: Former Smoker    Packs/day: 0.00  . Smokeless tobacco: Never Used  . Alcohol use No    Review of Systems Constitutional: No fever/chills. Feels weak and tired, maybe a little confused at times Eyes: No visual changes. ENT: No sore throat. Cardiovascular: Denies chest pain. Respiratory: Denies shortness of breath. Gastrointestinal: No abdominal pain.  No nausea, no vomiting.  No diarrhea.  No constipation. Genitourinary: Negative for dysuria. Musculoskeletal: Negative for back pain. Skin: Negative for rash. Neurological: Negative for headaches, focal weakness or numbness.  10-point ROS otherwise negative.  ____________________________________________   PHYSICAL EXAM:  VITAL SIGNS: ED Triage Vitals  Enc Vitals Group     BP 10/21/15 1455 (!) 145/84     Pulse Rate 10/21/15 1455 94     Resp 10/21/15 1455 20     Temp 10/21/15 1455 98.4 F (36.9 C)     Temp Source 10/21/15 1455 Oral     SpO2 10/21/15 1455 99 %     Weight 10/21/15 1456 174 lb (78.9 kg)     Height 10/21/15 1456 5\' 6"  (1.676  m)     Head Circumference --      Peak Flow --      Pain Score 10/21/15 1456 8     Pain Loc --      Pain Edu? --      Excl. in [place]? --     Constitutional: Alert and oriented. Mildly ill-appearing, generalized appearance of weakness and notably dry mouth.  Eyes: Conjunctivae are normal. PERRL. EOMI. Head: Atraumatic. Nose: No congestion/rhinnorhea. Mouth/Throat: Mucous membranes are dry.  Oropharynx non-erythematous. Neck: No  stridor.   Cardiovascular: Normal rate, regular rhythm. Grossly normal heart sounds.  Good peripheral circulation. Respiratory: Normal respiratory effort.  No retractions. Lungs CTAB. Gastrointestinal: Soft and nontender. No distention.  Musculoskeletal: No lower extremity tenderness nor edema.   Neurologic:  Normal speech and language. No gross focal neurologic deficits are appreciated. Skin:  Skin is warm, dry and intact. No rash noted. Psychiatric: Mood and affect are normal. Speech and behavior are normal.  ____________________________________________   LABS (all labs ordered are listed, but only abnormal results are displayed)  Labs Reviewed  BASIC METABOLIC PANEL - Abnormal; Notable for the following:       Result Value   Sodium 124 (*)    Chloride 90 (*)    Glucose, Bld 820 (*)    Creatinine, Ser 1.04 (*)    All other components within normal limits  CBC - Abnormal; Notable for the following:    Hemoglobin 10.1 (*)    HCT 33.2 (*)    MCV 67.9 (*)    MCH 20.6 (*)    MCHC 30.4 (*)    RDW 20.7 (*)    All other components within normal limits  URINALYSIS COMPLETEWITH MICROSCOPIC ([facility] ONLY) - Abnormal; Notable for the following:    Color, Urine COLORLESS (*)    APPearance CLEAR (*)    Glucose, UA >500 (*)    Ketones, ur 1+ (*)    Squamous Epithelial / LPF 0-5 (*)    All other components within normal limits  BLOOD GAS, VENOUS - Abnormal; Notable for the following:    pCO2, Ven 38 (*)    pO2, Ven 58.0 (*)    Acid-base deficit 3.6 (*)    All other components within normal limits  GLUCOSE, CAPILLARY - Abnormal; Notable for the following:    Glucose-Capillary >600 (*)    All other components within normal limits  OSMOLALITY  CBG MONITORING, ED  CBG MONITORING, ED   ____________________________________________  EKG   ____________________________________________  RADIOLOGY   ____________________________________________   PROCEDURES  Procedure(s) performed:  None  Procedures  Critical Care performed: No  ____________________________________________   INITIAL IMPRESSION / ASSESSMENT AND PLAN / ED COURSE  Pertinent labs & imaging results that were available during my care of the patient were reviewed by me and considered in my medical decision making (see chart for details).  Patient presents with elevated glucose, glucometer reading high. She clearly demonstrates polyuria polydipsia and dry mucous membranes and appears dehydrated. In addition, the patient does not appear to be in DKA today, though given the patient's reported weakness and potentially some mild confusion and appearing dehydrated by clinical examination I am concerned about HHS.  Discussed case with Dr. Doy Hutching, we will admit the patient start her on IV fluids, insulin, and treatment for suspected hyperglycemic nonketotic state. Patient's initial presentation with blood glucose later and 600, mild ketones noted in the urine, and appearing dehydrated appear consistent with hyper glycemic nonketotic state at this time.  No evidence for DKA.  Clinical Course     ____________________________________________   FINAL CLINICAL IMPRESSION(S) / ED DIAGNOSES  Final diagnoses:  Diabetic hyperosmolar non-ketotic state ([place])      NEW MEDICATIONS STARTED DURING THIS VISIT:  New Prescriptions   No medications on file     Note:  This document was prepared using Dragon voice recognition software and may include unintentional dictation errors.     Delman Kitten, MD 10/21/15 [phone]

## 2015-10-21 NOTE — Progress Notes (Signed)
Spoke with Dr. Ara Kussmaul and she wants to give pt 1/2 her scheduled dose of bedtime insulin even though she is on an insulin gtt.  She will receive 25 units instead of 50units.

## 2015-10-21 NOTE — ED Triage Notes (Addendum)
Pt reports she was just discharged from ICU 2 weeks ago for hypergycemia states her today her meter read HIGH. Our meter in Triage reads HIGH also

## 2015-10-21 NOTE — H&P (Signed)
[place] at [place] NAME: Jennifer [name]    MR#:  [id number]  DATE OF BIRTH:  12-Sep-1980  DATE OF ADMISSION:  10/21/2015  PRIMARY CARE PHYSICIAN: [place]   REQUESTING/REFERRING PHYSICIAN: [place]  CHIEF COMPLAINT:  No chief complaint on file.   HISTORY OF PRESENT ILLNESS: Jennifer [name]  is a 35 y.o. female with a known history of Dm, Htn- recent admission with Hyperglycemia- increased insulin lantus to 50  From 20 and after blood sugar control- d/c home. For 3 days it was under control, but since than she again noted blood sugar > 500. Continued to stay veery thirsty and nauseated. Came to ER- BS >800 again. She claims to be taking all her insulin and meds as advised since discharge.  PAST MEDICAL HISTORY:   Past Medical History:  Diagnosis Date  . Diabetes mellitus without complication ([place])   . Hypertension     PAST SURGICAL HISTORY: Past Surgical History:  Procedure Laterality Date  . none      SOCIAL HISTORY:  Social History  Substance Use Topics  . Smoking status: Former Smoker    Packs/day: 0.00  . Smokeless tobacco: Never Used  . Alcohol use No    FAMILY HISTORY:  Family History  Problem Relation Age of Onset  . Diabetes    . Hypertension      DRUG ALLERGIES:  Allergies  Allergen Reactions  . Demerol [Meperidine] Hives, Itching and Nausea And Vomiting  . Oxycodone Hives, Itching and Nausea And Vomiting  . Toradol [Ketorolac Tromethamine] Hives, Itching and Nausea And Vomiting    REVIEW OF SYSTEMS:   CONSTITUTIONAL: No fever, fatigue or weakness.  EYES: No blurred or double vision.  EARS, NOSE, AND THROAT: No tinnitus or ear pain.  RESPIRATORY: No cough, shortness of breath, wheezing or hemoptysis.  CARDIOVASCULAR: No chest pain, orthopnea, edema.  GASTROINTESTINAL: positive for nausea, no vomiting, diarrhea or abdominal pain.  GENITOURINARY: No dysuria, hematuria.  ENDOCRINE: No polyuria,  nocturia,  HEMATOLOGY: No anemia, easy bruising or bleeding SKIN: No rash or lesion. MUSCULOSKELETAL: No joint pain or arthritis.   NEUROLOGIC: No tingling, numbness, weakness.  PSYCHIATRY: No anxiety or depression.   MEDICATIONS AT HOME:  Prior to Admission medications   Medication Sig Start Date End Date Taking? Authorizing Provider  butalbital-acetaminophen-caffeine (FIORICET, ESGIC) 50-325-40 MG tablet Take 1 tablet by mouth every 6 (six) hours as needed for headache or migraine. 10/09/15   Vaughan Basta, MD  insulin glargine (LANTUS) 100 UNIT/ML injection Inject 0.5 mLs (50 Units total) into the skin daily. 10/09/15   Vaughan Basta, MD  lisinopril (PRINIVIL,ZESTRIL) 20 MG tablet Take 20 mg by mouth every morning.    Historical Provider, MD  metFORMIN (GLUCOPHAGE) 1000 MG tablet Take 0.5 tablets (500 mg total) by mouth 2 (two) times daily. Patient taking differently: Take 2,000 mg by mouth 2 (two) times daily.  11/15/14   Srikar Sudini, MD      PHYSICAL EXAMINATION:   VITAL SIGNS: Blood pressure (!) 139/99, pulse 83, temperature 98.4 F (36.9 C), temperature source Oral, resp. rate 16, height 5\' 6"  (1.676 m), weight 78.9 kg (174 lb), last menstrual period 09/24/2015, SpO2 99 %.  GENERAL:  35 y.o.-year-old patient lying in the bed with no acute distress.  EYES: Pupils equal, round, reactive to light and accommodation. No scleral icterus. Extraocular muscles intact.  HEENT: Head atraumatic, normocephalic. Oropharynx and nasopharynx clear.  NECK:  Supple, no jugular venous distention. No  thyroid enlargement, no tenderness.  LUNGS: Normal breath sounds bilaterally, no wheezing, rales,rhonchi or crepitation. No use of accessory muscles of respiration.  CARDIOVASCULAR: S1, S2 normal. No murmurs, rubs, or gallops.  ABDOMEN: Soft, nontender, nondistended. Bowel sounds present. No organomegaly or mass.  EXTREMITIES: No pedal edema, cyanosis, or clubbing.  NEUROLOGIC: Cranial  nerves II through XII are intact. Muscle strength 5/5 in all extremities. Sensation intact. Gait not checked.  PSYCHIATRIC: The patient is alert and oriented x 3.  SKIN: No obvious rash, lesion, or ulcer.   LABORATORY PANEL:   CBC  Recent Labs Lab 10/21/15 1504  WBC 9.0  HGB 10.1*  HCT 33.2*  PLT 273  MCV 67.9*  MCH 20.6*  MCHC 30.4*  RDW 20.7*   ------------------------------------------------------------------------------------------------------------------  Chemistries   Recent Labs Lab 10/21/15 1504  NA 124*  K 4.3  CL 90*  CO2 22  GLUCOSE 820*  BUN 15  CREATININE 1.04*  CALCIUM 9.9   ------------------------------------------------------------------------------------------------------------------ estimated creatinine clearance is 78.5 mL/min (by C-G formula based on SCr of 1.04 mg/dL (H)). ------------------------------------------------------------------------------------------------------------------ No results for input(s): TSH, T4TOTAL, T3FREE, THYROIDAB in the last 72 hours.  Invalid input(s): FREET3   Coagulation profile No results for input(s): INR, PROTIME in the last 168 hours. ------------------------------------------------------------------------------------------------------------------- No results for input(s): DDIMER in the last 72 hours. -------------------------------------------------------------------------------------------------------------------  Cardiac Enzymes No results for input(s): CKMB, TROPONINI, MYOGLOBIN in the last 168 hours.  Invalid input(s): CK ------------------------------------------------------------------------------------------------------------------ Invalid input(s): POCBNP  ---------------------------------------------------------------------------------------------------------------  Urinalysis    Component Value Date/Time   COLORURINE COLORLESS (A) 10/21/2015 1609   APPEARANCEUR CLEAR (A) 10/21/2015 1609    APPEARANCEUR Hazy 05/28/2013 2350   LABSPEC 1.024 10/21/2015 1609   LABSPEC 1.015 05/28/2013 2350   PHURINE 6.0 10/21/2015 1609   GLUCOSEU >500 (A) 10/21/2015 1609   GLUCOSEU >=500 05/28/2013 2350   HGBUR NEGATIVE 10/21/2015 1609   BILIRUBINUR NEGATIVE 10/21/2015 1609   BILIRUBINUR Negative 05/28/2013 2350   KETONESUR 1+ (A) 10/21/2015 1609   PROTEINUR NEGATIVE 10/21/2015 1609   NITRITE NEGATIVE 10/21/2015 1609   LEUKOCYTESUR NEGATIVE 10/21/2015 1609   LEUKOCYTESUR Negative 05/28/2013 2350     RADIOLOGY: No results found.  EKG: Orders placed or performed during the hospital encounter of 10/06/15  . ED EKG  . ED EKG  . EKG 12-Lead  . EKG 12-Lead    IMPRESSION AND PLAN:  * Hyperglycemia without ketosis   IV fluids and IV insulin drip.   Monitor in Step down unit.   Lantus 50 unit daily.   Endocrinology consult.  * Hypertension   Cont lisinopril.  * headache   Cont fioricet    CT head was negative 2 weeks ago.   All the records are reviewed and case discussed with ED provider. Management plans discussed with the patient, family and they are in agreement.  CODE STATUS: Full. Code Status History    Date Active Date Inactive Code Status Order ID Comments User Context   10/06/2015 11:14 PM 10/09/2015  5:01 PM Full Code [id number]  Mikael Spray, NP ED   11/13/2014  9:19 PM 11/15/2014  4:18 PM Full Code [id number]  Dustin Flock, MD Inpatient       TOTAL TIME TAKING CARE OF THIS PATIENT: 50 critical care minutes.    Vaughan Basta M.D on 10/21/2015   Between 7am to 6pm - Pager - [phone]  After 6pm go to [email or web address] - password [email or web address] [place] [facility]  Office  [phone]  CC: Primary care physician; [name]   Note: This dictation was prepared with Sales executive along with smaller Company secretary. Any transcriptional errors that result from this process are unintentional.

## 2015-10-22 LAB — CBC
HCT: 30.6 % — ABNORMAL LOW (ref 35.0–47.0)
HEMOGLOBIN: 9.6 g/dL — AB (ref 12.0–16.0)
MCH: 20.5 pg — AB (ref 26.0–34.0)
MCHC: 31.4 g/dL — ABNORMAL LOW (ref 32.0–36.0)
MCV: 65.1 fL — AB (ref 80.0–100.0)
PLATELETS: 227 10*3/uL (ref 150–440)
RBC: 4.69 MIL/uL (ref 3.80–5.20)
RDW: 19.5 % — ABNORMAL HIGH (ref 11.5–14.5)
WBC: 9.6 10*3/uL (ref 3.6–11.0)

## 2015-10-22 LAB — GLUCOSE, CAPILLARY
GLUCOSE-CAPILLARY: 101 mg/dL — AB (ref 65–99)
GLUCOSE-CAPILLARY: 142 mg/dL — AB (ref 65–99)
GLUCOSE-CAPILLARY: 157 mg/dL — AB (ref 65–99)
GLUCOSE-CAPILLARY: 174 mg/dL — AB (ref 65–99)
GLUCOSE-CAPILLARY: 221 mg/dL — AB (ref 65–99)
GLUCOSE-CAPILLARY: 224 mg/dL — AB (ref 65–99)
GLUCOSE-CAPILLARY: 247 mg/dL — AB (ref 65–99)
GLUCOSE-CAPILLARY: 471 mg/dL — AB (ref 65–99)
GLUCOSE-CAPILLARY: 96 mg/dL (ref 65–99)
Glucose-Capillary: 132 mg/dL — ABNORMAL HIGH (ref 65–99)
Glucose-Capillary: 155 mg/dL — ABNORMAL HIGH (ref 65–99)
Glucose-Capillary: 180 mg/dL — ABNORMAL HIGH (ref 65–99)
Glucose-Capillary: 196 mg/dL — ABNORMAL HIGH (ref 65–99)
Glucose-Capillary: 189 mg/dL — ABNORMAL HIGH (ref 65–99)
Glucose-Capillary: 190 mg/dL — ABNORMAL HIGH (ref 65–99)
Glucose-Capillary: 135 mg/dL — ABNORMAL HIGH (ref 65–99)
Glucose-Capillary: 467 mg/dL — ABNORMAL HIGH (ref 65–99)
Glucose-Capillary: 518 mg/dL (ref 65–99)

## 2015-10-22 LAB — BASIC METABOLIC PANEL
ANION GAP: 8 (ref 5–15)
BUN: 13 mg/dL (ref 6–20)
CHLORIDE: 105 mmol/L (ref 101–111)
CO2: 21 mmol/L — AB (ref 22–32)
CREATININE: 0.83 mg/dL (ref 0.44–1.00)
Calcium: 8.8 mg/dL — ABNORMAL LOW (ref 8.9–10.3)
GFR calc non Af Amer: >60 mL/min (ref >60)
Glucose, Bld: 131 mg/dL — ABNORMAL HIGH (ref 65–99)
POTASSIUM: 3.2 mmol/L — AB (ref 3.5–5.1)
SODIUM: 134 mmol/L — AB (ref 135–145)

## 2015-10-22 LAB — MAGNESIUM: MAGNESIUM: 1.7 mg/dL (ref 1.7–2.4)

## 2015-10-22 LAB — POTASSIUM: Potassium: 4.2 mmol/L (ref 3.5–5.1)

## 2015-10-22 MED ORDER — INSULIN ASPART 100 UNIT/ML ~~LOC~~ SOLN
15.0000 [IU] | Freq: Three times a day (TID) | SUBCUTANEOUS | Status: DC
  Administered 2015-10-22 – 2015-10-23 (×3): 15 [IU] via SUBCUTANEOUS
  Filled 2015-10-22 (×3): qty 15

## 2015-10-22 MED ORDER — MORPHINE SULFATE (PF) 2 MG/ML IV SOLN
2.0000 mg | INTRAVENOUS | Status: DC | PRN
  Administered 2015-10-22 – 2015-10-25 (×15): 2 mg via INTRAVENOUS
  Filled 2015-10-22 (×15): qty 1

## 2015-10-22 MED ORDER — INSULIN ASPART 100 UNIT/ML ~~LOC~~ SOLN
0.0000 [IU] | Freq: Every day | SUBCUTANEOUS | Status: DC
  Administered 2015-10-22: 5 [IU] via SUBCUTANEOUS
  Administered 2015-10-23: 3 [IU] via SUBCUTANEOUS
  Administered 2015-10-24: 2 [IU] via SUBCUTANEOUS
  Filled 2015-10-22: qty 2
  Filled 2015-10-22: qty 5
  Filled 2015-10-22: qty 3

## 2015-10-22 MED ORDER — INSULIN GLARGINE 100 UNIT/ML ~~LOC~~ SOLN
25.0000 [IU] | Freq: Two times a day (BID) | SUBCUTANEOUS | Status: DC
  Administered 2015-10-22 – 2015-10-23 (×3): 25 [IU] via SUBCUTANEOUS
  Filled 2015-10-22 (×4): qty 0.25

## 2015-10-22 MED ORDER — MORPHINE SULFATE (PF) 2 MG/ML IV SOLN
2.0000 mg | Freq: Once | INTRAVENOUS | Status: AC
  Administered 2015-10-22: 2 mg via INTRAVENOUS

## 2015-10-22 MED ORDER — ACETAMINOPHEN 325 MG PO TABS
650.0000 mg | ORAL_TABLET | Freq: Four times a day (QID) | ORAL | Status: DC | PRN
  Administered 2015-10-24 – 2015-10-25 (×2): 650 mg via ORAL
  Filled 2015-10-22 (×2): qty 2

## 2015-10-22 MED ORDER — INSULIN ASPART 100 UNIT/ML ~~LOC~~ SOLN
0.0000 [IU] | Freq: Three times a day (TID) | SUBCUTANEOUS | Status: DC
  Administered 2015-10-22: 20 [IU] via SUBCUTANEOUS
  Administered 2015-10-23 (×2): 15 [IU] via SUBCUTANEOUS
  Administered 2015-10-23: 4 [IU] via SUBCUTANEOUS
  Administered 2015-10-24: 15 [IU] via SUBCUTANEOUS
  Administered 2015-10-24 – 2015-10-25 (×2): 11 [IU] via SUBCUTANEOUS
  Filled 2015-10-22: qty 11
  Filled 2015-10-22: qty 15
  Filled 2015-10-22: qty 11
  Filled 2015-10-22: qty 20
  Filled 2015-10-22: qty 4
  Filled 2015-10-22 (×2): qty 15

## 2015-10-22 MED ORDER — POTASSIUM CHLORIDE CRYS ER 20 MEQ PO TBCR
40.0000 meq | EXTENDED_RELEASE_TABLET | Freq: Once | ORAL | Status: AC
  Administered 2015-10-22: 40 meq via ORAL
  Filled 2015-10-22: qty 2

## 2015-10-22 NOTE — Progress Notes (Addendum)
Inpatient Diabetes Program Recommendations  AACE/ADA: New Consensus Statement on Inpatient Glycemic Control (2015)  Target Ranges:  Prepandial:   less than 140 mg/dL      Peak postprandial:   less than 180 mg/dL (1-2 hours)      Critically ill patients:  140 - 180 mg/dL   Results for Jennifer, [name] (MRN [id number]) as of 10/22/2015 08:17  Ref. Range 10/22/2015 00:28 10/22/2015 01:31 10/22/2015 02:34 10/22/2015 03:31 10/22/2015 04:27 10/22/2015 05:36 10/22/2015 06:26 10/22/2015 07:22  Glucose-Capillary Latest Ref Range: 65 - 99 mg/dL 101 (H) 96 224 (H) 221 (H) 174 (H) 132 (H) 155 (H) 180 (H)  Results for Jennifer, [name] (MRN [id number]) as of 10/22/2015 08:17  Ref. Range 10/21/2015 15:04  Glucose Latest Ref Range: 65 - 99 mg/dL [facility])  Results for Jennifer, [name] (MRN [id number]) as of 10/22/2015 08:17  Ref. Range 10/08/2015 04:15  Hemoglobin A1C Latest Ref Range: 4.0 - 6.0 % 12.2 (H)   Review of Glycemic Control  Diabetes history: DM2 Outpatient Diabetes medications: Levemir 24 units BID, Novolog 50 units TID with meals, Metformin 2000 mg daily (however, pt was discharged on 10/09/15 from [facility] on Lantus 50 units daily, Novolog 20 units TID with meals, and Metformin 500 mg BID Current orders for Inpatient glycemic control: IV insulin drip per GlucoStabilizer  Inpatient Diabetes Program Recommendations: Insulin - Basal: Recommend patient be transitioned to SQ insulin at this time. Patient received Lantus 25 units at 22:39 on 10/21/15 and patient has required high hourly drip rates. Please consider ordering Lantus 25 units BID (start now). Correction (SSI): Recommend patient be transitioned to SQ insulin at this time. Please consider ordering Novolog 0-20 units ACHS. Insulin - Meal Coverage: Recommend patient be transitioned to SQ insulin at this time. Please consider ordering Novolog 15 units TID with meals for meal coverage if patient eats at least 50% of meal. Diet: Please order Carb Modified  diet.  NOTE: Patient was recently hospitalized are [facility] from 10/06/15 to 10/09/15 with DKA. Patient does not have insurance and should be going to [facility] for medical care. On 10/09/15 patient was discharged on Lantus 50 units daily, Novolog 20 units TID with meals, and Metformin 500 mg BID for DM control. According to the chart, Rollene Rotunda, RN, CM talked with patient about [facility] and was given an application for [facility] for assistance with medications. Patient's home medication list currently in chart is completely different than DM medications she was discharge on from [facility] on 10/09/15. Diabetes Coordinator talked with patient on 10/09/15 (see note for details). Will continue to follow and will talk with patient again today.  Addendum 10/22/15@13 :00- Spoke with patient about diabetes and home regimen for diabetes control. Patient reports that she is followed by [facility] for diabetes management and she has not made a follow up appointment since she was discharged from [facility] on 10/09/15. Patient reports that she is taking Levemir 24 units daily, Novolog 50 units TID with meals, and Metformin 2000 mg daily as an outpatient for diabetes control. Patient reports that she is paying out of pocket for medications at the clinic and her insulins are over $100 each.  Discussed DM medications noted on discharge summary on 10/09/15 which were Lantus 50 units daily, Novolog 20 units TID, Metformin 500 mg BID. Patient reports that she was not taking DM medications that way and confirmed she was taking as noted above. Patient was given application for [facility]  during last hospitalization. Inquired if she went to the [facility] and she stated "Not yet. I wasn't expecting to be back to the hospital so soon."  Patient states that since she was discharged home on 10/09/15 her glucose has been running high (reading HI on glucometer). Patient  reports that she does not want to go back to the [facility] and would like to go to the [facility]. Informed patient I would request consult with CM to discuss. Explained to the patient she needed to pay close attention to discharge instructions for any changes with DM medications, she needed to follow up with Clinic (either [name] or [facility]), she needed to take application to [facility] for assistance with medications, and she needed to take DM medications as prescribed.   Patient verbalized understanding of information discussed and she states that she has no further questions at this time related to diabetes.  Thanks, Barnie Alderman, RN, MSN, CDE Diabetes Coordinator Inpatient Diabetes Program [phone] (Team Pager from [place] to [place]) [phone] ([place] office) [phone] [facility] office) [phone] [facility] office)

## 2015-10-22 NOTE — Progress Notes (Signed)
[place] consulted for electrolyte management in this 59 yoF admitted with hyperglycemia without ketoacidosis. Pt no longer on insulin drip.  1. Electrolyte monitoring: K+ = 3.2. Pt received KCl 40 mEq PO x 1. K scheduled to be checked at 1800.  BMP and mag have been ordered with AM labs.  Allergies  Allergen Reactions  . Demerol [Meperidine] Hives, Itching and Nausea And Vomiting  . Oxycodone Hives, Itching and Nausea And Vomiting  . Toradol [Ketorolac Tromethamine] Hives, Itching and Nausea And Vomiting    Patient Measurements: Height: 5' 6.5" (168.9 cm) Weight: 186 lb 1.1 oz (84.4 kg) IBW/kg (Calculated) : 60.45   Vital Signs: Temp: 98.4 F (36.9 C) (09/18 1550) Temp Source: Oral (09/18 1550) BP: 107/64 (09/18 1550) Pulse Rate: 80 (09/18 1550) Intake/Output from previous day: 09/17 0701 - 09/18 0700 In: 73.1 [I.V.:63.1] Out: 250 [Urine:250] Intake/Output from this shift: Total I/O In: 88.2 [I.V.:28.2; Other:60] Out: -   Labs:  Recent Labs  10/21/15 1504 10/22/15 0514  WBC 9.0 9.6  HGB 10.1* 9.6*  HCT 33.2* 30.6*  PLT 273 227     Recent Labs  10/21/15 1504 10/22/15 0514  NA 124* 134*  K 4.3 3.2*  CL 90* 105  CO2 22 21*  GLUCOSE 820* 131*  BUN 15 13  CREATININE 1.04* 0.83  CALCIUM 9.9 8.8*  MG  --  1.7   Estimated Creatinine Clearance: 102.7 mL/min (by C-G formula based on SCr of 0.83 mg/dL).    Recent Labs  10/22/15 1130 10/22/15 1404 10/22/15 1606  GLUCAP 142* 135* 467*    Medical History: Past Medical History:  Diagnosis Date  . Diabetes mellitus without complication ([place])   . Hypertension     Medications:  Scheduled:  . heparin  5,000 Units Subcutaneous Q8H  . insulin aspart  0-20 Units Subcutaneous TID WC  . insulin aspart  0-5 Units Subcutaneous QHS  . insulin aspart  15 Units Subcutaneous TID WC  . insulin glargine  25 Units Subcutaneous BID  . lisinopril  20 mg Oral Elenora Fender,  PharmD Pharmacy Resident 10/22/2015 4:26 PM

## 2015-10-22 NOTE — Progress Notes (Signed)
Spoke with Dr. Ara Kussmaul. Pt with critical blood sugar levels of 518, 471. Pt is on sliding scale and Lantus this evening. Per MD no new orders ok with existing insulin orders. Ok to give 5 units of Novolog from sliding scale.

## 2015-10-22 NOTE — Progress Notes (Signed)
Received patient from icu alert and oriented x4,hemodynamically stable,requesting dilaudid instead of morphine for headache,will notify [place].

## 2015-10-22 NOTE — Progress Notes (Signed)
Jennifer [name] at [place] NAME: Jennifer [name]    MR#:  [id number]  DATE OF BIRTH:  [birth year]  SUBJECTIVE: Patient is seen at the bedside . complains of headache.   CHIEF COMPLAINT:  No chief complaint on file.   REVIEW OF SYSTEMS:   ROS CONSTITUTIONAL: No fever, fatigue or weakness.  EYES: No blurred or double vision.  EARS, NOSE, AND THROAT: No tinnitus or ear pain. Complains of headache RESPIRATORY: No cough, shortness of breath, wheezing or hemoptysis.  CARDIOVASCULAR: No chest pain, orthopnea, edema.  GASTROINTESTINAL: No nausea, vomiting, diarrhea or abdominal pain.  GENITOURINARY: No dysuria, hematuria.  ENDOCRINE: No polyuria, nocturia,  HEMATOLOGY: No anemia, easy bruising or bleeding SKIN: No rash or lesion. MUSCULOSKELETAL: No joint pain or arthritis.   NEUROLOGIC: No tingling, numbness, weakness.  PSYCHIATRY: No anxiety or depression.   DRUG ALLERGIES:   Allergies  Allergen Reactions  . Demerol [Meperidine] Hives, Itching and Nausea And Vomiting  . Oxycodone Hives, Itching and Nausea And Vomiting  . Toradol [Ketorolac Tromethamine] Hives, Itching and Nausea And Vomiting    VITALS:  Blood pressure 118/75, pulse 74, temperature 98 F (36.7 C), temperature source Oral, resp. rate 11, height 5' 6.5" (1.689 m), weight 84.4 kg (186 lb 1.1 oz), last menstrual period 09/24/2015, SpO2 97 %.  PHYSICAL EXAMINATION:  GENERAL:  35 y.o.-year-old patient lying in the bed with no acute distress.  EYES: Pupils equal, round, reactive to light and accommodation. No scleral icterus. Extraocular muscles intact.  HEENT: Head atraumatic, normocephalic. Oropharynx and nasopharynx clear. Headache NECK:  Supple, no jugular venous distention. No thyroid enlargement, no tenderness.  LUNGS: Normal breath sounds bilaterally, no wheezing, rales,rhonchi or crepitation. No use of accessory muscles of respiration.  CARDIOVASCULAR: S1, S2 normal.  No murmurs, rubs, or gallops.  ABDOMEN: Soft, nontender, nondistended. Bowel sounds present. No organomegaly or mass.  EXTREMITIES: No pedal edema, cyanosis, or clubbing.  NEUROLOGIC: Cranial nerves II through XII are intact. Muscle strength 5/5 in all extremities. Sensation intact. Gait not checked.  PSYCHIATRIC: The patient is alert and oriented x 3.  SKIN: No obvious rash, lesion, or ulcer.    LABORATORY PANEL:   CBC  Recent Labs Lab 10/22/15 0514  WBC 9.6  HGB 9.6*  HCT 30.6*  PLT 227   ------------------------------------------------------------------------------------------------------------------  Chemistries   Recent Labs Lab 10/22/15 0514  NA 134*  K 3.2*  CL 105  CO2 21*  GLUCOSE 131*  BUN 13  CREATININE 0.83  CALCIUM 8.8*  MG 1.7   ------------------------------------------------------------------------------------------------------------------  Cardiac Enzymes No results for input(s): TROPONINI in the last 168 hours. ------------------------------------------------------------------------------------------------------------------  RADIOLOGY:  No results found.  EKG:   Orders placed or performed during the hospital encounter of 10/06/15  . ED EKG  . ED EKG  . EKG 12-Lead  . EKG 12-Lead    ASSESSMENT AND PLAN:   Hyperglycemia: Without ketoacidosis. Admitted to ICU, started on insulin drip, IV hydration. Seen by diabetes nurse. Patient discharged on September 5 with Lantus 50 units daily, NovoLog 20 units 3 times a day, metformin 500 MG twice a day, according to the patient she continue to take the all of them and also checking the blood sugars 3 times a day. Patient right now seen by diabetes nurse, she recommended Lantus 25 units twice a day, NovoLog 15 units 3 times a day with meals, NovoLog coverage with before meals and at bedtime. Patient can be transferred to regular floor, monitor the  blood sugars today and see how she does, discussed with  case management about medication management clinic. Patient ultimately needs endocrinology  eval as an outpatient #2 hypokalemia replace the potassium yesterday the protocol #3 headache secondary to hyperglycemia: Head CT unremarkable. Patient is requesting Dilaudid.    All the records are reviewed and case discussed with Care Management/Social Workerr. Management plans discussed with the patient, family and they are in agreement.  CODE STATUS: full  TOTAL TIME TAKING CARE OF THIS PATIENT: 45 minutes.   POSSIBLE D/C IN 1-2 DAYS, DEPENDING ON CLINICAL CONDITION.   Epifanio Lesches M.D on 10/22/2015 at 1:18 PM  Between 7am to 6pm - Pager - [phone]  After 6pm go to [email or web address] - password [email or web address] [place] [facility]  Office  [phone]  CC: Primary care physician; [name]   Note: This dictation was prepared with Dragon dictation along with smaller phrase technology. Any transcriptional errors that result from this process are unintentional.

## 2015-10-22 NOTE — Progress Notes (Signed)
[place] consulted for electrolyte management in this 58 yoF admitted with hyperglycemia without ketoacidosis. Pt no longer on insulin drip.  1. Electrolyte monitoring: K+ = 4.2 No supplementation needed. BMP and mag have been ordered with AM labs.  Allergies  Allergen Reactions  . Demerol [Meperidine] Hives, Itching and Nausea And Vomiting  . Oxycodone Hives, Itching and Nausea And Vomiting  . Toradol [Ketorolac Tromethamine] Hives, Itching and Nausea And Vomiting    Patient Measurements: Height: 5' 6.5" (168.9 cm) Weight: 186 lb 1.1 oz (84.4 kg) IBW/kg (Calculated) : 60.45   Vital Signs: Temp: 98.4 F (36.9 C) (09/18 1550) Temp Source: Oral (09/18 1550) BP: 107/64 (09/18 1550) Pulse Rate: 80 (09/18 1550) Intake/Output from previous day: 09/17 0701 - 09/18 0700 In: 73.1 [I.V.:63.1] Out: 250 [Urine:250] Intake/Output from this shift: Total I/O In: 88.2 [I.V.:28.2; Other:60] Out: -   Labs:  Recent Labs  10/21/15 1504 10/22/15 0514  WBC 9.0 9.6  HGB 10.1* 9.6*  HCT 33.2* 30.6*  PLT 273 227     Recent Labs  10/21/15 1504 10/22/15 0514 10/22/15 1751  NA 124* 134*  --   K 4.3 3.2* 4.2  CL 90* 105  --   CO2 22 21*  --   GLUCOSE 820* 131*  --   BUN 15 13  --   CREATININE 1.04* 0.83  --   CALCIUM 9.9 8.8*  --   MG  --  1.7  --    Estimated Creatinine Clearance: 102.7 mL/min (by C-G formula based on SCr of 0.83 mg/dL).    Recent Labs  10/22/15 1130 10/22/15 1404 10/22/15 1606  GLUCAP 142* 135* 467*    Medical History: Past Medical History:  Diagnosis Date  . Diabetes mellitus without complication ([place])   . Hypertension     Medications:  Scheduled:  . heparin  5,000 Units Subcutaneous Q8H  . insulin aspart  0-20 Units Subcutaneous TID WC  . insulin aspart  0-5 Units Subcutaneous QHS  . insulin aspart  15 Units Subcutaneous TID WC  . insulin glargine  25 Units Subcutaneous BID  . lisinopril  20 mg Oral [address] D  Sherman Lipuma, Pharm.D Clinical Pharmacist  10/22/2015 6:28 PM

## 2015-10-23 LAB — BASIC METABOLIC PANEL
ANION GAP: 5 (ref 5–15)
BUN: 18 mg/dL (ref 6–20)
CALCIUM: 8.5 mg/dL — AB (ref 8.9–10.3)
CHLORIDE: 102 mmol/L (ref 101–111)
CO2: 23 mmol/L (ref 22–32)
Creatinine, Ser: 0.92 mg/dL (ref 0.44–1.00)
GFR calc non Af Amer: >60 mL/min (ref >60)
GLUCOSE: 338 mg/dL — AB (ref 65–99)
Potassium: 3.9 mmol/L (ref 3.5–5.1)
Sodium: 130 mmol/L — ABNORMAL LOW (ref 135–145)

## 2015-10-23 LAB — GLUCOSE, CAPILLARY
GLUCOSE-CAPILLARY: 320 mg/dL — AB (ref 65–99)
GLUCOSE-CAPILLARY: 341 mg/dL — AB (ref 65–99)
GLUCOSE-CAPILLARY: 334 mg/dL — AB (ref 65–99)
Glucose-Capillary: 179 mg/dL — ABNORMAL HIGH (ref 65–99)
Glucose-Capillary: 255 mg/dL — ABNORMAL HIGH (ref 65–99)

## 2015-10-23 LAB — MAGNESIUM: Magnesium: 2 mg/dL (ref 1.7–2.4)

## 2015-10-23 MED ORDER — FLUCONAZOLE 100 MG PO TABS
150.0000 mg | ORAL_TABLET | Freq: Once | ORAL | Status: AC
  Administered 2015-10-23: 150 mg via ORAL
  Filled 2015-10-23: qty 2

## 2015-10-23 MED ORDER — DOCUSATE SODIUM 100 MG PO CAPS
100.0000 mg | ORAL_CAPSULE | Freq: Two times a day (BID) | ORAL | Status: DC | PRN
  Administered 2015-10-23 – 2015-10-24 (×2): 100 mg via ORAL
  Filled 2015-10-23 (×2): qty 1

## 2015-10-23 MED ORDER — SENNOSIDES-DOCUSATE SODIUM 8.6-50 MG PO TABS
1.0000 | ORAL_TABLET | Freq: Every evening | ORAL | Status: DC | PRN
  Administered 2015-10-24: 1 via ORAL
  Filled 2015-10-23: qty 1

## 2015-10-23 MED ORDER — INSULIN GLARGINE 100 UNIT/ML ~~LOC~~ SOLN
28.0000 [IU] | Freq: Two times a day (BID) | SUBCUTANEOUS | Status: DC
  Administered 2015-10-23 – 2015-10-24 (×2): 28 [IU] via SUBCUTANEOUS
  Filled 2015-10-23 (×4): qty 0.28

## 2015-10-23 MED ORDER — INSULIN ASPART 100 UNIT/ML ~~LOC~~ SOLN
20.0000 [IU] | Freq: Three times a day (TID) | SUBCUTANEOUS | Status: DC
  Administered 2015-10-23 – 2015-10-25 (×4): 20 [IU] via SUBCUTANEOUS
  Filled 2015-10-23 (×4): qty 20

## 2015-10-23 MED ORDER — METFORMIN HCL 500 MG PO TABS
1000.0000 mg | ORAL_TABLET | Freq: Two times a day (BID) | ORAL | Status: DC
  Administered 2015-10-23 – 2015-10-25 (×4): 1000 mg via ORAL
  Filled 2015-10-23 (×4): qty 2

## 2015-10-23 NOTE — Progress Notes (Signed)
[place] consulted for electrolyte management in this 36 yoF admitted with hyperglycemia without ketoacidosis. Pt no longer on insulin drip.  1. Electrolyte monitoring: K+ = 3.9 No supplementation needed. BMP and mag have been ordered with AM labs.  Allergies  Allergen Reactions  . Demerol [Meperidine] Hives, Itching and Nausea And Vomiting  . Oxycodone Hives, Itching and Nausea And Vomiting  . Toradol [Ketorolac Tromethamine] Hives, Itching and Nausea And Vomiting    Patient Measurements: Height: 5' 6.5" (168.9 cm) Weight: 186 lb 1.1 oz (84.4 kg) IBW/kg (Calculated) : 60.45   Vital Signs: Temp: 98.5 F (36.9 C) (09/19 1142) Temp Source: Oral (09/19 1142) BP: 111/63 (09/19 1142) Pulse Rate: 89 (09/19 1142) Intake/Output from previous day: 09/18 0701 - 09/19 0700 In: 88.2 [I.V.:28.2] Out: -  Intake/Output from this shift: No intake/output data recorded.  Labs:  Recent Labs  10/21/15 1504 10/22/15 0514  WBC 9.0 9.6  HGB 10.1* 9.6*  HCT 33.2* 30.6*  PLT 273 227     Recent Labs  10/21/15 1504 10/22/15 0514 10/22/15 1751 10/23/15 0440  NA 124* 134*  --  130*  K 4.3 3.2* 4.2 3.9  CL 90* 105  --  102  CO2 22 21*  --  23  GLUCOSE 820* 131*  --  338*  BUN 15 13  --  18  CREATININE 1.04* 0.83  --  0.92  CALCIUM 9.9 8.8*  --  8.5*  MG  --  1.7  --  2.0   Estimated Creatinine Clearance: 92.7 mL/min (by C-G formula based on SCr of 0.92 mg/dL).    Recent Labs  10/22/15 2023 10/23/15 0151 10/23/15 0734  GLUCAP 471* 320* 341*    Medical History: Past Medical History:  Diagnosis Date  . Diabetes mellitus without complication ([place])   . Hypertension     Medications:  Scheduled:  . heparin  5,000 Units Subcutaneous Q8H  . insulin aspart  0-20 Units Subcutaneous TID WC  . insulin aspart  0-5 Units Subcutaneous QHS  . insulin aspart  15 Units Subcutaneous TID WC  . insulin glargine  25 Units Subcutaneous BID  . lisinopril  20 mg Oral  [id number]    Paulina Fusi, PharmD, BCPS 10/23/2015 11:50 AM

## 2015-10-23 NOTE — Progress Notes (Signed)
Inpatient Diabetes Program Recommendations  AACE/ADA: New Consensus Statement on Inpatient Glycemic Control (2015)  Target Ranges:  Prepandial:   less than 140 mg/dL      Peak postprandial:   less than 180 mg/dL (1-2 hours)      Critically ill patients:  140 - 180 mg/dL   Lab Results  Component Value Date   GLUCAP 341 (H) 10/23/2015   HGBA1C 12.2 (H) 10/08/2015    Review of Glycemic Control  Results for Jennifer, [name] (MRN [id number]) as of 10/23/2015 14:05  Ref. Range 10/22/2015 16:06 10/22/2015 20:19 10/22/2015 20:23 10/23/2015 01:51 10/23/2015 07:34  Glucose-Capillary Latest Ref Range: 65 - 99 mg/dL 467 (H) 518 (HH) 471 (H) 320 (H) 341 (H)     Diabetes history: DM2  Outpatient Diabetes medications: Levemir 24 units BID, Novolog 50 units TID with meals, Metformin 2000 mg daily (however, pt was discharged on 10/09/15 from [facility] on Lantus 50 units daily, Novolog 20 units TID with meals, and Metformin 500 mg BID  Current orders for Inpatient glycemic control: Lantus 28 units bid to start this evening, Novolog 15 units tid with meals, Novolog 0-20 units tid with meals, Novolog 0-5 units qhs  Inpatient Diabetes Program Recommendations: Please consider increasing Novolog to 20 units TID with meals for meal coverage if patient eats at least 50% of meal.  Consider restarting Metformin, 1000mg  bid.    Gentry Fitz, RN, BA, MHA, CDE Diabetes Coordinator Inpatient Diabetes Program  [phone] (Team Pager) [phone] ([place]) 10/23/2015 2:13 PM

## 2015-10-23 NOTE — Progress Notes (Signed)
Dr. Vianne Bulls notified at patient's request. Patient stated she had a yeast infection. Dr. Vianne Bulls telephone ordered diflucan 150 mg po x1 dose

## 2015-10-23 NOTE — Care Management (Signed)
Chart reviewed. Recently discharged from [facility] with diabetic ketoacidosis. Readmitted with the same. It is noted that patient is active with [name]. She has been given an application for medication management reports compliance with her medications. She lives with her spouse and drives. No needs identified at this time.

## 2015-10-23 NOTE — Progress Notes (Signed)
[place] at [place] NAME: Jennifer [name]    MR#:  [id number]  DATE OF BIRTH:  1980-04-10  SUBJECTIVE: She still complains of headache. Also stated that she had a yeast infection. Blood sugar is in 300 range. Had the blood sugar of 500 yesterday evening. She still complains of headache and says that Toradol, Fioricet does not help.   CHIEF COMPLAINT:  No chief complaint on file.   REVIEW OF SYSTEMS:   ROS CONSTITUTIONAL: No fever, fatigue or weakness. C/o  of headache.  EYES: No blurred or double vision.  EARS, NOSE, AND THROAT: No tinnitus or ear pain. Complains of headache RESPIRATORY: No cough, shortness of breath, wheezing or hemoptysis.  CARDIOVASCULAR: No chest pain, orthopnea, edema.  GASTROINTESTINAL: No nausea, vomiting, diarrhea or abdominal pain.  GENITOURINARY: No dysuria, hematuria.  ENDOCRINE: No polyuria, nocturia,  HEMATOLOGY: No anemia, easy bruising or bleeding SKIN: No rash or lesion. MUSCULOSKELETAL: No joint pain or arthritis.   NEUROLOGIC: No tingling, numbness, weakness.  PSYCHIATRY: No anxiety or depression.   DRUG ALLERGIES:   Allergies  Allergen Reactions  . Demerol [Meperidine] Hives, Itching and Nausea And Vomiting  . Oxycodone Hives, Itching and Nausea And Vomiting  . Toradol [Ketorolac Tromethamine] Hives, Itching and Nausea And Vomiting    VITALS:  Blood pressure 111/63, pulse 89, temperature 98.5 F (36.9 C), temperature source Oral, resp. rate 18, height 5' 6.5" (1.689 m), weight 84.4 kg (186 lb 1.1 oz), last menstrual period 09/24/2015, SpO2 97 %.  PHYSICAL EXAMINATION:  GENERAL:  35 y.o.-year-old patient lying in the bed with no acute distress.  EYES: Pupils equal, round, reactive to light and accommodation. No scleral icterus. Extraocular muscles intact.  HEENT: Head atraumatic, normocephalic. Oropharynx and nasopharynx clear. Headache NECK:  Supple, no jugular venous distention. No  thyroid enlargement, no tenderness.  LUNGS: Normal breath sounds bilaterally, no wheezing, rales,rhonchi or crepitation. No use of accessory muscles of respiration.  CARDIOVASCULAR: S1, S2 normal. No murmurs, rubs, or gallops.  ABDOMEN: Soft, nontender, nondistended. Bowel sounds present. No organomegaly or mass.  EXTREMITIES: No pedal edema, cyanosis, or clubbing.  NEUROLOGIC: Cranial nerves II through XII are intact. Muscle strength 5/5 in all extremities. Sensation intact. Gait not checked.  PSYCHIATRIC: The patient is alert and oriented x 3.  SKIN: No obvious rash, lesion, or ulcer.    LABORATORY PANEL:   CBC  Recent Labs Lab 10/22/15 0514  WBC 9.6  HGB 9.6*  HCT 30.6*  PLT 227   ------------------------------------------------------------------------------------------------------------------  Chemistries   Recent Labs Lab 10/23/15 0440  NA 130*  K 3.9  CL 102  CO2 23  GLUCOSE 338*  BUN 18  CREATININE 0.92  CALCIUM 8.5*  MG 2.0   ------------------------------------------------------------------------------------------------------------------  Cardiac Enzymes No results for input(s): TROPONINI in the last 168 hours. ------------------------------------------------------------------------------------------------------------------  RADIOLOGY:  No results found.  EKG:   Orders placed or performed during the hospital encounter of 10/06/15  . ED EKG  . ED EKG  . EKG 12-Lead  . EKG 12-Lead    ASSESSMENT AND PLAN:   1 diabetes mellitus type 2; uncontrolled having issues with blood sugar. I just the Lantus to 28 units twice a day NovoLog 15 units with meals, SSI with coverage. #2 headache secondary to hyperglycemia: Patient requesting IV morphine is only medicine that helps. 3. Asked patient use stool softeners #4. Yeast infection as per patient: Use Diflucan 1 dose. Unable to discharge today because of issues blood sugar,  patient needs fine-tuning of  insulin.  D/w RN,appreciate presence of RN during rounds.  All the records are reviewed and case discussed with Care Management/Social Workerr. Management plans discussed with the patient, family and they are in agreement.  CODE STATUS: full  TOTAL TIME TAKING CARE OF THIS PATIENT: 45 minutes.   POSSIBLE D/C IN 1-2 DAYS, DEPENDING ON CLINICAL CONDITION.   Epifanio Lesches M.D on 10/23/2015 at 1:27 PM  Between 7am to 6pm - Pager - [phone]  After 6pm go to [email or web address] - password [email or web address] [place] [facility]  Office  [phone]  CC: Primary care physician; [name]   Note: This dictation was prepared with Dragon dictation along with smaller phrase technology. Any transcriptional errors that result from this process are unintentional.

## 2015-10-23 NOTE — Progress Notes (Signed)
Dr. Vianne Bulls notified patient's complaint of being constipated. Stated to order senna and colace.

## 2015-10-24 ENCOUNTER — Inpatient Hospital Stay: Payer: Self-pay

## 2015-10-24 LAB — BASIC METABOLIC PANEL
ANION GAP: 4 — AB (ref 5–15)
BUN: 16 mg/dL (ref 6–20)
CO2: 24 mmol/L (ref 22–32)
Calcium: 8.7 mg/dL — ABNORMAL LOW (ref 8.9–10.3)
Chloride: 103 mmol/L (ref 101–111)
Creatinine, Ser: 0.86 mg/dL (ref 0.44–1.00)
Glucose, Bld: 338 mg/dL — ABNORMAL HIGH (ref 65–99)
POTASSIUM: 4.1 mmol/L (ref 3.5–5.1)
SODIUM: 131 mmol/L — AB (ref 135–145)

## 2015-10-24 LAB — GLUCOSE, CAPILLARY
GLUCOSE-CAPILLARY: 284 mg/dL — AB (ref 65–99)
GLUCOSE-CAPILLARY: 243 mg/dL — AB (ref 65–99)
Glucose-Capillary: 341 mg/dL — ABNORMAL HIGH (ref 65–99)
Glucose-Capillary: 109 mg/dL — ABNORMAL HIGH (ref 65–99)
Glucose-Capillary: 214 mg/dL — ABNORMAL HIGH (ref 65–99)

## 2015-10-24 MED ORDER — INSULIN GLARGINE 100 UNIT/ML ~~LOC~~ SOLN
35.0000 [IU] | Freq: Two times a day (BID) | SUBCUTANEOUS | Status: DC
  Administered 2015-10-24 – 2015-10-25 (×2): 35 [IU] via SUBCUTANEOUS
  Filled 2015-10-24 (×3): qty 0.35

## 2015-10-24 MED ORDER — ENOXAPARIN SODIUM 40 MG/0.4ML ~~LOC~~ SOLN
40.0000 mg | SUBCUTANEOUS | Status: DC
  Administered 2015-10-24: 40 mg via SUBCUTANEOUS
  Filled 2015-10-24: qty 0.4

## 2015-10-24 NOTE — Progress Notes (Signed)
Oto consulted for electrolyte management in this 56 yoF admitted with hyperglycemia without ketoacidosis. Pt no longer on insulin drip.  1. Electrolyte monitoring: K+ = 4.1. No supplementation needed. BMP and mag have been ordered with AM labs.  Allergies  Allergen Reactions  . Demerol [Meperidine] Hives, Itching and Nausea And Vomiting  . Oxycodone Hives, Itching and Nausea And Vomiting  . Toradol [Ketorolac Tromethamine] Hives, Itching and Nausea And Vomiting    Patient Measurements: Height: 5' 6.5" (168.9 cm) Weight: 186 lb 1.1 oz (84.4 kg) IBW/kg (Calculated) : 60.45   Vital Signs: Temp: 98.9 F (37.2 C) (09/20 1229) Temp Source: Oral (09/20 1229) BP: 116/73 (09/20 1229) Pulse Rate: 85 (09/20 1229) Intake/Output from previous day: 09/19 0701 - 09/20 0700 In: 240 [P.O.:240] Out: 0  Intake/Output from this shift: No intake/output data recorded.  Labs:  Recent Labs  10/21/15 1504 10/22/15 0514  WBC 9.0 9.6  HGB 10.1* 9.6*  HCT 33.2* 30.6*  PLT 273 227     Recent Labs  10/22/15 0514 10/22/15 1751 10/23/15 0440 10/24/15 0523  NA 134*  --  130* 131*  K 3.2* 4.2 3.9 4.1  CL 105  --  102 103  CO2 21*  --  23 24  GLUCOSE 131*  --  338* 338*  BUN 13  --  18 16  CREATININE 0.83  --  0.92 0.86  CALCIUM 8.8*  --  8.5* 8.7*  MG 1.7  --  2.0  --    Estimated Creatinine Clearance: 99.1 mL/min (by C-G formula based on SCr of 0.86 mg/dL).    Recent Labs  10/23/15 2114 10/24/15 0742 10/24/15 1157  GLUCAP 255* 341* 284*    Medical History: Past Medical History:  Diagnosis Date  . Diabetes mellitus without complication ([place])   . Hypertension     Medications:  Scheduled:  . heparin  5,000 Units Subcutaneous Q8H  . insulin aspart  0-20 Units Subcutaneous TID WC  . insulin aspart  0-5 Units Subcutaneous QHS  . insulin aspart  20 Units Subcutaneous TID WC  . insulin glargine  35 Units Subcutaneous BID  . lisinopril  20 mg Oral  [id number]  . metFORMIN  1,000 mg Oral BID WC    Paulina Fusi, PharmD, BCPS 10/24/2015 12:55 PM

## 2015-10-24 NOTE — Progress Notes (Signed)
Inpatient Diabetes Program Recommendations  AACE/ADA: New Consensus Statement on Inpatient Glycemic Control (2015)  Target Ranges:  Prepandial:   less than 140 mg/dL      Peak postprandial:   less than 180 mg/dL (1-2 hours)      Critically ill patients:  140 - 180 mg/dL   Results for Jennifer [name], Jennifer [name] (MRN [id number]) as of 10/24/2015 09:54  Ref. Range 10/23/2015 07:34 10/23/2015 11:43 10/23/2015 17:04 10/23/2015 21:14 10/24/2015 07:42  Glucose-Capillary Latest Ref Range: 65 - 99 mg/dL 341 (H) 334 (H) 179 (H) 255 (H) 341 (H)    Review of Glycemic Control  Current orders for Inpatient glycemic control: Lantus 28 units BID, Novolog 20 units TID with meals for meal coverage, Metformin 1000 mg BID, Novolog 0-20 units TID with meals, Novolog 0-5 units QHS  Inpatient Diabetes Program Recommendations:  Insulin - Basal: Please consider increasing Lantus to 35 units BID.  Thanks, Barnie Alderman, RN, MSN, CDE Diabetes Coordinator Inpatient Diabetes Program [phone] (Team Pager from [place] to [place]) [phone] ([place] office) [phone] [facility] office) [phone] [facility] office)

## 2015-10-24 NOTE — Progress Notes (Signed)
[facility] at [place] NAME: Jennifer [name]    MR#:  [id number]  DATE OF BIRTH:  1980/05/19  SUBJECTIVE: Having elevated blood glucose up to 340s. Still complains of headache. Requiring  IV morphine every 4 hours.   CHIEF COMPLAINT:  No chief complaint on file.   REVIEW OF SYSTEMS:   ROS CONSTITUTIONAL: No fever, fatigue or weakness. C/o  of headache.  EYES: No blurred or double vision.  EARS, NOSE, AND THROAT: No tinnitus or ear pain. Complains of headache RESPIRATORY: No cough, shortness of breath, wheezing or hemoptysis.  CARDIOVASCULAR: No chest pain, orthopnea, edema.  GASTROINTESTINAL: No nausea, vomiting, diarrhea or abdominal pain.  GENITOURINARY: No dysuria, hematuria.  ENDOCRINE: No polyuria, nocturia,  HEMATOLOGY: No anemia, easy bruising or bleeding SKIN: No rash or lesion. MUSCULOSKELETAL: No joint pain or arthritis.   NEUROLOGIC: No tingling, numbness, weakness.  PSYCHIATRY: No anxiety or depression.   DRUG ALLERGIES:   Allergies  Allergen Reactions  . Demerol [Meperidine] Hives, Itching and Nausea And Vomiting  . Oxycodone Hives, Itching and Nausea And Vomiting  . Toradol [Ketorolac Tromethamine] Hives, Itching and Nausea And Vomiting    VITALS:  Blood pressure 116/73, pulse 85, temperature 98.9 F (37.2 C), temperature source Oral, resp. rate 14, height 5' 6.5" (1.689 m), weight 84.4 kg (186 lb 1.1 oz), last menstrual period 09/24/2015, SpO2 99 %.  PHYSICAL EXAMINATION:  GENERAL:  35 y.o.-year-old patient lying in the bed with no acute distress.  EYES: Pupils equal, round, reactive to light and accommodation. No scleral icterus. Extraocular muscles intact.  HEENT: Head atraumatic, normocephalic. Oropharynx and nasopharynx clear. Headache NECK:  Supple, no jugular venous distention. No thyroid enlargement, no tenderness.  LUNGS: Normal breath sounds bilaterally, no wheezing, rales,rhonchi or crepitation. No  use of accessory muscles of respiration.  CARDIOVASCULAR: S1, S2 normal. No murmurs, rubs, or gallops.  ABDOMEN: Soft, nontender, nondistended. Bowel sounds present. No organomegaly or mass.  EXTREMITIES: No pedal edema, cyanosis, or clubbing.  NEUROLOGIC: Cranial nerves II through XII are intact. Muscle strength 5/5 in all extremities. Sensation intact. Gait not checked.  PSYCHIATRIC: The patient is alert and oriented x 3.  SKIN: No obvious rash, lesion, or ulcer.    LABORATORY PANEL:   CBC  Recent Labs Lab 10/22/15 0514  WBC 9.6  HGB 9.6*  HCT 30.6*  PLT 227   ------------------------------------------------------------------------------------------------------------------  Chemistries   Recent Labs Lab 10/23/15 0440 10/24/15 0523  NA 130* 131*  K 3.9 4.1  CL 102 103  CO2 23 24  GLUCOSE 338* 338*  BUN 18 16  CREATININE 0.92 0.86  CALCIUM 8.5* 8.7*  MG 2.0  --    ------------------------------------------------------------------------------------------------------------------  Cardiac Enzymes No results for input(s): TROPONINI in the last 168 hours. ------------------------------------------------------------------------------------------------------------------  RADIOLOGY:  No results found.  EKG:   Orders placed or performed during the hospital encounter of 10/06/15  . ED EKG  . ED EKG  . EKG 12-Lead  . EKG 12-Lead    ASSESSMENT AND PLAN:   1 diabetes mellitus type 2; uncontrolled, having issues with blood sugar,At home with blood sugars up to 500s..  Patient Lantus is increased to 35 units twice a day, we have added metformin 1 g by mouth twice a day, he is already on now NovoLog 20 units 3 times a day.  j#2 headache secondary to hyperglycemia: I suspect she has narcotic  seeking behavior. Request neurology consult, possibly start Topamax. Spent related to discharge we  will not be able to give any narcotic prescription.   3. Asked patient use stool  softeners for constipation. #4. Yeast infection as per patient: Use Diflucan 1 dose. Unable to discharge today because of issues blood sugar, patient needs fine-tuning of insulin.  D/w RN,appreciate presence of RN during rounds.  All the records are reviewed and case discussed with Care Management/Social Workerr. Management plans discussed with the patient, family and they are in agreement.  CODE STATUS: full  TOTAL TIME TAKING CARE OF THIS PATIENT: 45 minutes.   POSSIBLE D/C IN 1-2 DAYS, DEPENDING ON CLINICAL CONDITION.   Epifanio Lesches M.D on 10/24/2015 at 1:08 PM  Between 7am to 6pm - Pager - [phone]  After 6pm go to [email or web address] - password [email or web address] [place] [facility]  Office  [phone]  CC: Primary care physician; [name]   Note: This dictation was prepared with Dragon dictation along with smaller phrase technology. Any transcriptional errors that result from this process are unintentional.

## 2015-10-24 NOTE — Progress Notes (Signed)
Patient continues to be stable, monitoring CBGs closely. Evening insulin held due to CBG being 109. Pt c/o constipation, PRNs given. Dr Lewie Loron has made rounds and updated, pt continues to require iv pain meds due to c/o headaches. Head CT ordered and is unremarkable. No ss of distress noted.

## 2015-10-25 DIAGNOSIS — E669 Obesity, unspecified: Secondary | ICD-10-CM

## 2015-10-25 DIAGNOSIS — K5900 Constipation, unspecified: Secondary | ICD-10-CM

## 2015-10-25 DIAGNOSIS — E1100 Type 2 diabetes mellitus with hyperosmolarity without nonketotic hyperglycemic-hyperosmolar coma (NKHHC): Secondary | ICD-10-CM

## 2015-10-25 DIAGNOSIS — R519 Headache, unspecified: Secondary | ICD-10-CM

## 2015-10-25 DIAGNOSIS — G4733 Obstructive sleep apnea (adult) (pediatric): Secondary | ICD-10-CM

## 2015-10-25 DIAGNOSIS — R51 Headache: Secondary | ICD-10-CM

## 2015-10-25 DIAGNOSIS — I10 Essential (primary) hypertension: Secondary | ICD-10-CM

## 2015-10-25 LAB — BASIC METABOLIC PANEL
ANION GAP: 5 (ref 5–15)
BUN: 13 mg/dL (ref 6–20)
CHLORIDE: 105 mmol/L (ref 101–111)
CO2: 24 mmol/L (ref 22–32)
Calcium: 8.9 mg/dL (ref 8.9–10.3)
Creatinine, Ser: 0.77 mg/dL (ref 0.44–1.00)
GFR calc Af Amer: >60 mL/min (ref >60)
GLUCOSE: 205 mg/dL — AB (ref 65–99)
POTASSIUM: 4.1 mmol/L (ref 3.5–5.1)
Sodium: 134 mmol/L — ABNORMAL LOW (ref 135–145)

## 2015-10-25 LAB — CBC
HCT: 29.5 % — ABNORMAL LOW (ref 35.0–47.0)
HEMOGLOBIN: 9.1 g/dL — AB (ref 12.0–16.0)
MCH: 20.4 pg — AB (ref 26.0–34.0)
MCHC: 30.9 g/dL — ABNORMAL LOW (ref 32.0–36.0)
MCV: 66 fL — AB (ref 80.0–100.0)
PLATELETS: 289 10*3/uL (ref 150–440)
RBC: 4.47 MIL/uL (ref 3.80–5.20)
RDW: 19.9 % — ABNORMAL HIGH (ref 11.5–14.5)
WBC: 7.8 10*3/uL (ref 3.6–11.0)

## 2015-10-25 LAB — MAGNESIUM: MAGNESIUM: 1.7 mg/dL (ref 1.7–2.4)

## 2015-10-25 LAB — GLUCOSE, CAPILLARY: Glucose-Capillary: 258 mg/dL — ABNORMAL HIGH (ref 65–99)

## 2015-10-25 MED ORDER — DOCUSATE SODIUM 100 MG PO CAPS: 100.0000 mg | ORAL_CAPSULE | Freq: Two times a day (BID) | ORAL | 0 refills | Status: DC | PRN

## 2015-10-25 MED ORDER — METFORMIN HCL 1000 MG PO TABS: 1000.0000 mg | ORAL_TABLET | Freq: Two times a day (BID) | ORAL | 6 refills | Status: DC

## 2015-10-25 MED ORDER — INSULIN ASPART 100 UNIT/ML ~~LOC~~ SOLN: 0.0000 [IU] | Freq: Three times a day (TID) | SUBCUTANEOUS | 11 refills | Status: DC

## 2015-10-25 MED ORDER — INSULIN GLARGINE 100 UNIT/ML ~~LOC~~ SOLN: 35.0000 [IU] | Freq: Two times a day (BID) | SUBCUTANEOUS | 11 refills | Status: DC

## 2015-10-25 MED ORDER — INSULIN ASPART 100 UNIT/ML ~~LOC~~ SOLN: 20.0000 [IU] | Freq: Three times a day (TID) | SUBCUTANEOUS | 11 refills | Status: DC

## 2015-10-25 NOTE — Progress Notes (Signed)
Jennifer [name] to be D/C'd Home per MD order.  Discussed with the patient and all questions fully answered.  VSS, Skin clean, dry and intact without evidence of skin break down, no evidence of skin tears noted. IV catheter discontinued intact. Site without signs and symptoms of complications. Dressing and pressure applied.  An After Visit Summary was printed and given to the patient. Patient received prescription.  D/c education completed with patient/family including follow up instructions, medication list, d/c activities limitations if indicated, with other d/c instructions as indicated by MD - patient able to verbalize understanding, all questions fully answered.   Patient instructed to return to ED, call 911, or call MD for any changes in condition.   Patient escorted via [place], and D/C home via private auto.  Threasa Beards Valdemar Mcclenahan 10/25/2015 11:11 AM

## 2015-10-25 NOTE — Discharge Summary (Signed)
[facility] at [place] NAME: Jennifer [name]    MR#:  [id number]  DATE OF BIRTH:  September 17, 1980  DATE OF ADMISSION:  10/21/2015 ADMITTING PHYSICIAN: Vaughan Basta, MD  DATE OF DISCHARGE: 10/25/2015 11:07 AM  PRIMARY CARE PHYSICIAN: [name]     ADMISSION DIAGNOSIS:  Diabetic hyperosmolar non-ketotic state ([place]) [E11.00]  DISCHARGE DIAGNOSIS:  Principal Problem:   Hyperglycemia without ketosis Active Problems:   Type 2 diabetes mellitus with hyperosmolar nonketotic hyperglycemia (HCC)   OSA (obstructive sleep apnea)   Headache   Obesity   Essential hypertension   Constipation   SECONDARY DIAGNOSIS:   Past Medical History:  Diagnosis Date  . Diabetes mellitus without complication ([place])   . Hypertension     .pro HOSPITAL COURSE:   . The patient is a 35 year old female with medical history significant for history of  Diabetes mellitus, , obesity, hypertension, who presents to the hospital with complaints of thirst and nausea. On arrival to the hospital, patient's blood glucose levels were more than 800, she  Claimed of taking her insulin. She was admitted to the hospital for IV insulin infusion and improved. . . While in the hospital patient complained of headache, she also exhibited signs of obstructive sleep apnea, including nocturnal awakenings, headaches, daytime sleepiness, . Obstructive sleep apnea evaluation with outpatient sleep study was recommended  As outpatient. She was felt to be stable to be discharged home today.   Discussion by problem:  #1. Diabetes mellitus type 2, poorly controlled, continue Lantus at 35 units twice daily, continue metformin, sliding scale insulin and NovoLog before meals, patient has improved significantly . #2. Headache, suspected obstructive sleep apnea, patient was recommended to have outpatient sleep study. Head CT was unremarkable.  . #3. Constipation, continue stool  softeners . #4. Essential hypertension, well-controlled on current medications . #4. Obesity, patient was advised to lose weight DISCHARGE CONDITIONS:   . Stable  CONSULTS OBTAINED:    DRUG ALLERGIES:   Allergies  Allergen Reactions  . Demerol [Meperidine] Hives, Itching and Nausea And Vomiting  . Oxycodone Hives, Itching and Nausea And Vomiting  . Toradol [Ketorolac Tromethamine] Hives, Itching and Nausea And Vomiting    DISCHARGE MEDICATIONS:   Discharge Medication List as of 10/25/2015 10:42 AM    START taking these medications   Details  docusate sodium (COLACE) 100 MG capsule Take 1 capsule (100 mg total) by mouth 2 (two) times daily as needed for mild constipation., Starting Thu 10/25/2015, Normal    !! insulin aspart (NOVOLOG) 100 UNIT/ML injection Inject 20 Units into the skin 3 (three) times daily with meals., Starting Thu 10/25/2015, Normal    !! insulin aspart (NOVOLOG) 100 UNIT/ML injection Inject 0-20 Units into the skin 3 (three) times daily with meals., Starting Thu 10/25/2015, Normal    insulin glargine (LANTUS) 100 UNIT/ML injection Inject 0.35 mLs (35 Units total) into the skin 2 (two) times daily., Starting Thu 10/25/2015, Normal     !! - Potential duplicate medications found. Please discuss with provider.    CONTINUE these medications which have CHANGED   Details  metFORMIN (GLUCOPHAGE) 1000 MG tablet Take 1 tablet (1,000 mg total) by mouth 2 (two) times daily with a meal., Starting Thu 10/25/2015, Normal      CONTINUE these medications which have NOT CHANGED   Details  lisinopril (PRINIVIL,ZESTRIL) 20 MG tablet Take 20 mg by mouth every morning., Historical Med    butalbital-acetaminophen-caffeine (FIORICET, ESGIC) 50-325-40 MG tablet  Take 1 tablet by mouth every 6 (six) hours as needed for headache or migraine., Starting Tue 10/09/2015, Print      STOP taking these medications     insulin detemir (LEVEMIR) 100 UNIT/ML injection      insulin regular  (NOVOLIN R,HUMULIN R) 100 units/mL injection          DISCHARGE INSTRUCTIONS:    . Patient is to follow-up with primary care physician, get a sleep study as outpatient,   Follow-up with Endocrinologist for insulin pump, which she wants to initiate  If you experience worsening of your admission symptoms, develop shortness of breath, life threatening emergency, suicidal or homicidal thoughts you must seek medical attention immediately by calling 911 or calling your MD immediately  if symptoms less severe.  You Must read complete instructions/literature along with all the possible adverse reactions/side effects for all the Medicines you take and that have been prescribed to you. Take any new Medicines after you have completely understood and accept all the possible adverse reactions/side effects.   Please note  You were cared for by a hospitalist during your hospital stay. If you have any questions about your discharge medications or the care you received while you were in the hospital after you are discharged, you can call the unit and asked to speak with the hospitalist on call if the hospitalist that took care of you is not available. Once you are discharged, your primary care physician will handle any further medical issues. Please note that NO REFILLS for any discharge medications will be authorized once you are discharged, as it is imperative that you return to your primary care physician (or establish a relationship with a primary care physician if you do not have one) for your aftercare needs so that they can reassess your need for medications and monitor your lab values.    Today   CHIEF COMPLAINT:  No chief complaint on file.   HISTORY OF PRESENT ILLNESS:  Jennifer [name]  is a 35 y.o. female with a known history of Diabetes mellitus, , obesity, hypertension, who presents to the hospital with complaints of thirst and nausea. On arrival to the hospital, patient's blood glucose levels  were more than 800, she  Claimed of taking her insulin. She was admitted to the hospital for IV insulin infusion and improved. . . While in the hospital patient complained of headache, she also exhibited signs of obstructive sleep apnea, including nocturnal awakenings, headaches, daytime sleepiness, . Obstructive sleep apnea evaluation with outpatient sleep study was recommended  As outpatient. She was felt to be stable to be discharged home today.   Discussion by problem:  #1. Diabetes mellitus type 2, poorly controlled, continue Lantus at 35 units twice daily, continue metformin, sliding scale insulin and NovoLog before meals, patient has improved significantly . #2. Headache, suspected obstructive sleep apnea, patient was recommended to have outpatient sleep study. Head CT was unremarkable.  . #3. Constipation, continue stool softeners . #4. Essential hypertension, well-controlled on current medications . #4. Obesity, patient was advised to lose weight    VITAL SIGNS:  Blood pressure 110/61, pulse 87, temperature 98.5 F (36.9 C), temperature source Oral, resp. rate 16, height 5' 6.5" (1.689 m), weight 84.4 kg (186 lb 1.1 oz), last menstrual period 09/24/2015, SpO2 97 %.  I/O:   Intake/Output Summary (Last 24 hours) at 10/25/15 1514 Last data filed at 10/25/15 0900  Gross per 24 hour  Intake  480 ml  Output                0 ml  Net              480 ml    PHYSICAL EXAMINATION:  GENERAL:  35 y.o.-year-old patient lying in the bed with no acute distress.  EYES: Pupils equal, round, reactive to light and accommodation. No scleral icterus. Extraocular muscles intact.  HEENT: Head atraumatic, normocephalic. Oropharynx and nasopharynx clear.  NECK:  Supple, no jugular venous distention. No thyroid enlargement, no tenderness.  LUNGS: Normal breath sounds bilaterally, no wheezing, rales,rhonchi or crepitation. No use of accessory muscles of respiration.  CARDIOVASCULAR: S1, S2  normal. No murmurs, rubs, or gallops.  ABDOMEN: Soft, non-tender, non-distended. Bowel sounds present. No organomegaly or mass.  EXTREMITIES: No pedal edema, cyanosis, or clubbing.  NEUROLOGIC: Cranial nerves II through XII are intact. Muscle strength 5/5 in all extremities. Sensation intact. Gait not checked.  PSYCHIATRIC: The patient is alert and oriented x 3.  SKIN: No obvious rash, lesion, or ulcer.   DATA REVIEW:   CBC  Recent Labs Lab 10/25/15 0331  WBC 7.8  HGB 9.1*  HCT 29.5*  PLT 289    Chemistries   Recent Labs Lab 10/25/15 0331  NA 134*  K 4.1  CL 105  CO2 24  GLUCOSE 205*  BUN 13  CREATININE 0.77  CALCIUM 8.9  MG 1.7    Cardiac Enzymes No results for input(s): TROPONINI in the last 168 hours.  Microbiology Results  Results for orders placed or performed during the hospital encounter of 10/06/15  MRSA PCR Screening     Status: None   Collection Time: 10/07/15  5:53 AM  Result Value Ref Range Status   MRSA by PCR NEGATIVE NEGATIVE Final    Comment:        The GeneXpert MRSA Assay (FDA approved for NASAL specimens only), is one component of a comprehensive MRSA colonization surveillance program. It is not intended to diagnose MRSA infection nor to guide or monitor treatment for MRSA infections.     RADIOLOGY:  Ct Head Wo Contrast  Result Date: 10/24/2015 CLINICAL DATA:  Headache associated with hyperglycemia. Discharged from ICU 2 weeks ago. History of hypertension diabetes. EXAM: CT HEAD WITHOUT CONTRAST TECHNIQUE: Contiguous axial images were obtained from the base of the skull through the vertex without intravenous contrast. COMPARISON:  CT HEAD October 08, 2015 FINDINGS: BRAIN: The ventricles and sulci are normal. No intraparenchymal hemorrhage, mass effect nor midline shift. No acute large vascular territory infarcts. No abnormal extra-axial fluid collections. Basal cisterns are patent. VASCULAR: Unremarkable. SKULL/SOFT TISSUES: No skull  fracture. No significant soft tissue swelling. ORBITS/SINUSES: The included ocular globes and orbital contents are normal.The mastoid aircells and included paranasal sinuses are well-aerated. OTHER: None. IMPRESSION: Normal CT HEAD. Electronically Signed   By: Elon Alas M.D.   On: 10/24/2015 14:40    EKG:   Orders placed or performed during the hospital encounter of 10/06/15  . ED EKG  . ED EKG  . EKG 12-Lead  . EKG 12-Lead      Management plans discussed with the patient, family and they are in agreement.  CODE STATUS:  Code Status History    Date Active Date Inactive Code Status Order ID Comments User Context   10/21/2015  6:29 PM 10/25/2015  2:07 PM Full Code [id number]  Vaughan Basta, MD Inpatient   10/06/2015 11:14 PM 10/09/2015  5:01 PM Full Code [id number]  Mikael Spray, NP ED   11/13/2014  9:19 PM 11/15/2014  4:18 PM Full Code [id number]  Dustin Flock, MD Inpatient      TOTAL TIME TAKING CARE OF THIS PATIENT: 35 minutes.    Theodoro Grist M.D on 10/25/2015 at 3:14 PM  Between 7am to 6pm - Pager - [phone]  After 6pm go to [email or web address] - password [email or web address] [facility]  [place] [facility]  Office  [phone]  CC: Primary care physician; [place]

## 2016-05-11 ENCOUNTER — Emergency Department
Admission: EM | Admit: 2016-05-11 | Discharge: 2016-05-11 | Disposition: A | Discharge status: Home / Self Care | Payer: Self-pay | Attending: Emergency Medicine | Admitting: Emergency Medicine

## 2016-05-11 DIAGNOSIS — F17200 Nicotine dependence, unspecified, uncomplicated: Secondary | ICD-10-CM | POA: Insufficient documentation

## 2016-05-11 DIAGNOSIS — K047 Periapical abscess without sinus: Secondary | ICD-10-CM | POA: Insufficient documentation

## 2016-05-11 DIAGNOSIS — K029 Dental caries, unspecified: Secondary | ICD-10-CM | POA: Insufficient documentation

## 2016-05-11 DIAGNOSIS — I10 Essential (primary) hypertension: Secondary | ICD-10-CM | POA: Insufficient documentation

## 2016-05-11 DIAGNOSIS — E119 Type 2 diabetes mellitus without complications: Secondary | ICD-10-CM | POA: Insufficient documentation

## 2016-05-11 DIAGNOSIS — K0889 Other specified disorders of teeth and supporting structures: Secondary | ICD-10-CM

## 2016-05-11 DIAGNOSIS — Z794 Long term (current) use of insulin: Secondary | ICD-10-CM | POA: Insufficient documentation

## 2016-05-11 MED ORDER — IBUPROFEN 600 MG PO TABS: 600.0000 mg | ORAL_TABLET | Freq: Four times a day (QID) | ORAL | 0 refills | Status: DC | PRN

## 2016-05-11 MED ORDER — TRAMADOL HCL 50 MG PO TABS: 50.0000 mg | ORAL_TABLET | Freq: Four times a day (QID) | ORAL | 0 refills | Status: DC | PRN

## 2016-05-11 MED ORDER — AMOXICILLIN-POT CLAVULANATE 875-125 MG PO TABS: 1.0000 | ORAL_TABLET | Freq: Two times a day (BID) | ORAL | 0 refills | Status: AC

## 2016-05-11 NOTE — ED Provider Notes (Signed)
[facility] Provider Note   CSN: [id number] Arrival date & time: 05/11/16  0859     History   Chief Complaint Chief Complaint  Patient presents with  . Otalgia    HPI Jennifer [name] is a 36 y.o. female presents to the emergency for evaluation of left lower jaw pain. Patient states her back lower wisdom tooth is hurting her. She's had some facial swelling on the left side. It hurts to chew. She denies any trauma injury to her history of cracking her tooth. She denies any fevers, headaches, numbness or tingling. She has been taking ibuprofen with no improvement.  HPI  Past Medical History:  Diagnosis Date  . Diabetes mellitus without complication ([facility])   . Hypertension     Patient Active Problem List   Diagnosis Date Noted  . Type 2 diabetes mellitus with hyperosmolar nonketotic hyperglycemia ([place]) 10/25/2015  . Constipation 10/25/2015  . OSA (obstructive sleep apnea) 10/25/2015  . Headache 10/25/2015  . Obesity 10/25/2015  . Essential hypertension 10/25/2015  . Hyperglycemia without ketosis 10/21/2015  . Diabetic ketoacidosis associated with type 1 diabetes mellitus ([place]) 10/06/2015  . Hyperglycemia 11/13/2014    Past Surgical History:  Procedure Laterality Date  . none      OB History    No data available       Home Medications    Prior to Admission medications   Medication Sig Start Date End Date Taking? Authorizing Provider  amoxicillin-clavulanate (AUGMENTIN) 875-125 MG tablet Take 1 tablet by mouth every 12 (twelve) hours. 05/11/16 05/21/16  Duanne Guess, PA-C  butalbital-acetaminophen-caffeine (FIORICET, ESGIC) [phone] MG tablet Take 1 tablet by mouth every 6 (six) hours as needed for headache or migraine. Patient not taking: Reported on 10/21/2015 10/09/15   Vaughan Basta, MD  docusate sodium (COLACE) 100 MG capsule Take 1 capsule (100 mg total) by mouth 2 (two) times daily as needed for mild constipation. 10/25/15   Theodoro Grist,  MD  ibuprofen (ADVIL,MOTRIN) 600 MG tablet Take 1 tablet (600 mg total) by mouth every 6 (six) hours as needed for moderate pain. 05/11/16   Duanne Guess, PA-C  insulin aspart (NOVOLOG) 100 UNIT/ML injection Inject 20 Units into the skin 3 (three) times daily with meals. 10/25/15   Theodoro Grist, MD  insulin aspart (NOVOLOG) 100 UNIT/ML injection Inject 0-20 Units into the skin 3 (three) times daily with meals. 10/25/15   Theodoro Grist, MD  insulin glargine (LANTUS) 100 UNIT/ML injection Inject 0.35 mLs (35 Units total) into the skin 2 (two) times daily. 10/25/15   Theodoro Grist, MD  lisinopril (PRINIVIL,ZESTRIL) 20 MG tablet Take 20 mg by mouth every morning.    Historical Provider, MD  metFORMIN (GLUCOPHAGE) 1000 MG tablet Take 1 tablet (1,000 mg total) by mouth 2 (two) times daily with a meal. 10/25/15   Theodoro Grist, MD  traMADol (ULTRAM) 50 MG tablet Take 1 tablet (50 mg total) by mouth every 6 (six) hours as needed. 05/11/16   Duanne Guess, PA-C    Family History Family History  Problem Relation Age of Onset  . Diabetes    . Hypertension      Social History Social History  Substance Use Topics  . Smoking status: Current Every Day Smoker    Packs/day: 0.00  . Smokeless tobacco: Never Used  . Alcohol use No     Allergies   Demerol [meperidine]; Oxycodone; and Toradol [ketorolac tromethamine]   Review of Systems Review of Systems  Constitutional: Negative.  Negative for chills and fever.  HENT: Positive for dental problem and facial swelling. Negative for drooling, mouth sores, trouble swallowing and voice change.   Respiratory: Negative for chest tightness and shortness of breath.   Cardiovascular: Negative for chest pain.  Gastrointestinal: Negative for abdominal pain, diarrhea, nausea and vomiting.  Musculoskeletal: Negative for arthralgias, neck pain and neck stiffness.  Skin: Negative.   Psychiatric/Behavioral: Negative for confusion.  All other systems reviewed and  are negative.    Physical Exam Updated Vital Signs BP (!) 175/102 (BP Location: Left Arm)   Pulse 80   Temp 98 F (36.7 C) (Oral)   Resp 17   Ht 5' 6.5" (1.689 m)   Wt 89.8 kg   LMP 05/04/2016   SpO2 100%   BMI 31.48 kg/m   Physical Exam  Constitutional: She is oriented to person, place, and time. She appears well-developed and well-nourished. No distress.  HENT:  Head: Normocephalic and atraumatic.  Right Ear: External ear normal.  Left Ear: External ear normal.  Nose: Nose normal.  Mouth/Throat: Uvula is midline and oropharynx is clear and moist. No oral lesions. No trismus in the jaw. Normal dentition. Dental abscesses and dental caries present. No uvula swelling. No oropharyngeal exudate, posterior oropharyngeal edema, posterior oropharyngeal erythema or tonsillar abscesses.    Mild left-sided facial swelling along the TMJ down into the left lower mandible. No lymphadenopathy. Bilateral TMs normal  Eyes: Conjunctivae and EOM are normal. Right eye exhibits no discharge. Left eye exhibits no discharge.  Neck: Normal range of motion. Neck supple.  Cardiovascular: Normal rate, regular rhythm and normal heart sounds.   Pulmonary/Chest: Effort normal and breath sounds normal. No respiratory distress.  Lymphadenopathy:    She has no cervical adenopathy.  Neurological: She is alert and oriented to person, place, and time. No cranial nerve deficit.  Skin: Skin is warm and dry.  Psychiatric: She has a normal mood and affect. Her behavior is normal. Thought content normal.     ED Treatments / Results  Labs (all labs ordered are listed, but only abnormal results are displayed) Labs Reviewed - No data to display  EKG  EKG Interpretation None       Radiology No results found.  Procedures Procedures (including critical care time)  Medications Ordered in ED Medications - No data to display   Initial Impression / Assessment and Plan / ED Course  I have reviewed the  triage vital signs and the nursing notes.  Pertinent labs & imaging results that were available during my care of the patient were reviewed by me and considered in my medical decision making (see chart for details).     36 year old female with left sided facial pain. Patient tender along the back lower left tooth #17. Mild left-sided facial swelling. She is placed on oral antibiotics, given a prescription for tramadol and ibuprofen. She'll follow dental clinic. She is educated on signs and symptoms return to the ED for.  Final Clinical Impressions(s) / ED Diagnoses   Final diagnoses:  Pain, dental  Dental abscess    New Prescriptions New Prescriptions   AMOXICILLIN-CLAVULANATE (AUGMENTIN) 875-125 MG TABLET    Take 1 tablet by mouth every 12 (twelve) hours.   IBUPROFEN (ADVIL,MOTRIN) 600 MG TABLET    Take 1 tablet (600 mg total) by mouth every 6 (six) hours as needed for moderate pain.   TRAMADOL (ULTRAM) 50 MG TABLET    Take 1 tablet (50 mg total) by mouth every 6 (six) hours  as needed.     Duanne Guess, PA-C 05/11/16 6815    Lavonia Drafts, MD 05/13/16 [phone]

## 2016-05-11 NOTE — ED Notes (Signed)
Pt notes hx/o HTN, has not taken her medications this morning.

## 2016-05-11 NOTE — ED Triage Notes (Addendum)
Patient comes in via POV complaining of left ear pain and sore throat. Patient in no apparent distress at this time. Patient states no N/V but headache and swollen left side of face. Respirations even and unlabored.

## 2016-05-11 NOTE — Discharge Instructions (Signed)
Please take medications as prescribed and follow-up with dental clinic in 5-7 days if no improvement. Return to the ER for any increasing pain and swelling fevers worsening symptoms or urgent changes in her health.

## 2016-09-11 ENCOUNTER — Emergency Department
Admission: EM | Admit: 2016-09-11 | Discharge: 2016-09-11 | Disposition: A | Discharge status: Home / Self Care | Payer: Self-pay | Attending: Emergency Medicine | Admitting: Emergency Medicine

## 2016-09-11 ENCOUNTER — Encounter: Payer: Self-pay | Admitting: Emergency Medicine

## 2016-09-11 DIAGNOSIS — E1165 Type 2 diabetes mellitus with hyperglycemia: Secondary | ICD-10-CM | POA: Insufficient documentation

## 2016-09-11 DIAGNOSIS — I10 Essential (primary) hypertension: Secondary | ICD-10-CM | POA: Insufficient documentation

## 2016-09-11 DIAGNOSIS — R51 Headache: Secondary | ICD-10-CM | POA: Insufficient documentation

## 2016-09-11 DIAGNOSIS — R739 Hyperglycemia, unspecified: Secondary | ICD-10-CM

## 2016-09-11 DIAGNOSIS — R519 Headache, unspecified: Secondary | ICD-10-CM

## 2016-09-11 DIAGNOSIS — F17200 Nicotine dependence, unspecified, uncomplicated: Secondary | ICD-10-CM | POA: Insufficient documentation

## 2016-09-11 LAB — GLUCOSE, CAPILLARY
GLUCOSE-CAPILLARY: 442 mg/dL — AB (ref 65–99)
GLUCOSE-CAPILLARY: 386 mg/dL — AB (ref 65–99)
Glucose-Capillary: 254 mg/dL — ABNORMAL HIGH (ref 65–99)

## 2016-09-11 LAB — URINALYSIS, COMPLETE (UACMP) WITH MICROSCOPIC
BACTERIA UA: NONE SEEN
BILIRUBIN URINE: NEGATIVE
Glucose, UA: >=500 mg/dL — AB
KETONES UR: 20 mg/dL — AB
LEUKOCYTES UA: NEGATIVE
Nitrite: NEGATIVE
Protein, ur: NEGATIVE mg/dL
Specific Gravity, Urine: 1.026 (ref 1.005–1.030)
pH: 5 (ref 5.0–8.0)

## 2016-09-11 LAB — CBC
HEMATOCRIT: 33.9 % — AB (ref 35.0–47.0)
Hemoglobin: 10.3 g/dL — ABNORMAL LOW (ref 12.0–16.0)
MCH: 19.3 pg — AB (ref 26.0–34.0)
MCHC: 30.4 g/dL — ABNORMAL LOW (ref 32.0–36.0)
MCV: 63.6 fL — AB (ref 80.0–100.0)
PLATELETS: 329 10*3/uL (ref 150–440)
RBC: 5.32 MIL/uL — ABNORMAL HIGH (ref 3.80–5.20)
RDW: 20.3 % — AB (ref 11.5–14.5)
WBC: 9.9 10*3/uL (ref 3.6–11.0)

## 2016-09-11 LAB — BASIC METABOLIC PANEL
Anion gap: 10 (ref 5–15)
BUN: 14 mg/dL (ref 6–20)
CHLORIDE: 100 mmol/L — AB (ref 101–111)
CO2: 21 mmol/L — AB (ref 22–32)
CREATININE: 0.93 mg/dL (ref 0.44–1.00)
Calcium: 9.6 mg/dL (ref 8.9–10.3)
GFR calc Af Amer: >60 mL/min (ref >60)
GFR calc non Af Amer: >60 mL/min (ref >60)
Glucose, Bld: 442 mg/dL — ABNORMAL HIGH (ref 65–99)
POTASSIUM: 4.4 mmol/L (ref 3.5–5.1)
SODIUM: 131 mmol/L — AB (ref 135–145)

## 2016-09-11 MED ORDER — SODIUM CHLORIDE 0.9 % IV BOLUS (SEPSIS)
1000.0000 mL | Freq: Once | INTRAVENOUS | Status: AC
  Administered 2016-09-11: 1000 mL via INTRAVENOUS

## 2016-09-11 MED ORDER — MORPHINE SULFATE (PF) 4 MG/ML IV SOLN
4.0000 mg | Freq: Once | INTRAVENOUS | Status: AC
  Administered 2016-09-11: 4 mg via INTRAVENOUS

## 2016-09-11 MED ORDER — KETOROLAC TROMETHAMINE 30 MG/ML IJ SOLN
30.0000 mg | Freq: Once | INTRAMUSCULAR | Status: AC
  Administered 2016-09-11: 30 mg via INTRAVENOUS
  Filled 2016-09-11: qty 1

## 2016-09-11 MED ORDER — INSULIN ASPART 100 UNIT/ML ~~LOC~~ SOLN
10.0000 [IU] | Freq: Once | SUBCUTANEOUS | Status: AC
  Administered 2016-09-11: 10 [IU] via SUBCUTANEOUS
  Filled 2016-09-11: qty 1

## 2016-09-11 MED ORDER — BUTALBITAL-APAP-CAFFEINE 50-325-40 MG PO TABS
1.0000 | ORAL_TABLET | Freq: Four times a day (QID) | ORAL | 0 refills | Status: AC | PRN
Start: 2016-09-11 — End: 2017-09-11

## 2016-09-11 MED ORDER — METOCLOPRAMIDE HCL 5 MG/ML IJ SOLN
10.0000 mg | Freq: Once | INTRAMUSCULAR | Status: AC
  Administered 2016-09-11: 10 mg via INTRAVENOUS
  Filled 2016-09-11: qty 2

## 2016-09-11 MED ORDER — MORPHINE SULFATE (PF) 4 MG/ML IV SOLN
INTRAVENOUS | Status: AC
  Administered 2016-09-11: 4 mg via INTRAVENOUS
  Filled 2016-09-11: qty 1

## 2016-09-11 MED ORDER — DIPHENHYDRAMINE HCL 50 MG/ML IJ SOLN
25.0000 mg | Freq: Once | INTRAMUSCULAR | Status: AC
  Administered 2016-09-11: 25 mg via INTRAVENOUS

## 2016-09-11 MED ORDER — DIPHENHYDRAMINE HCL 50 MG/ML IJ SOLN
INTRAMUSCULAR | Status: DC
Start: 2016-09-11 — End: 2016-09-12
  Filled 2016-09-11: qty 1

## 2016-09-11 NOTE — ED Triage Notes (Signed)
Pt with high blood sugar for over a week.

## 2016-09-11 NOTE — ED Notes (Signed)
Pt called out, complaining of itching to right arm after Morphine given.  MD notified; see new orders.

## 2016-09-11 NOTE — ED Provider Notes (Signed)
[facility] Emergency Department Provider Note       Time seen:----------------------------------------- 6:28 PM on 09/11/2016 -----------------------------------------     I have reviewed the triage vital signs and the nursing notes.   HISTORY   Chief Complaint Hyperglycemia    HPI Jennifer [name] is a 36 y.o. female who presents to the ED for hyperglycemia patient has had some blurry vision with generalized weakness and headache. She also describes polyuria. Patient reports this happens to her at least once a year where she becomes really insulin resistant and her blood sugars spike. She denies any recent illness or other complaints. Patient states she is taking her medications as prescribed including Levemir, NovoLog and metformin.   Past Medical History:  Diagnosis Date  . Diabetes mellitus without complication ([place])   . Hypertension     Patient Active Problem List   Diagnosis Date Noted  . Type 2 diabetes mellitus with hyperosmolar nonketotic hyperglycemia ([place]) 10/25/2015  . Constipation 10/25/2015  . OSA (obstructive sleep apnea) 10/25/2015  . Headache 10/25/2015  . Obesity 10/25/2015  . Essential hypertension 10/25/2015  . Hyperglycemia without ketosis 10/21/2015  . Diabetic ketoacidosis associated with type 1 diabetes mellitus ([place]) 10/06/2015  . Hyperglycemia 11/13/2014    Past Surgical History:  Procedure Laterality Date  . none      Allergies Demerol [meperidine]; Oxycodone; and Toradol [ketorolac tromethamine]  Social History Social History  Substance Use Topics  . Smoking status: Current Every Day Smoker    Packs/day: 0.00  . Smokeless tobacco: Never Used  . Alcohol use No    Review of Systems Constitutional: Negative for fever. Eyes: Positive for vision changes ENT:  Negative for congestion, sore throat. Positive for dry mouth Cardiovascular: Negative for chest pain. Respiratory: Negative for shortness of  breath. Gastrointestinal: Negative for abdominal pain, vomiting and diarrhea. Genitourinary: Positive for polyuria Musculoskeletal: Negative for back pain. Skin: Negative for rash. Neurological: Negative for headaches, positive for generalized weakness  All systems negative/normal/unremarkable except as stated in the HPI  ____________________________________________   PHYSICAL EXAM:  VITAL SIGNS: ED Triage Vitals [09/11/16 1736]  Enc Vitals Group     BP (!) 169/101     Pulse Rate 92     Resp 18     Temp 98.8 F (37.1 C)     Temp Source Oral     SpO2 98 %     Weight 198 lb (89.8 kg)     Height      Head Circumference      Peak Flow      Pain Score      Pain Loc      Pain Edu?      Excl. in [place]?     Constitutional: Alert and oriented. Mild distress Eyes: Conjunctivae are normal. Normal extraocular movements. ENT   Head: Normocephalic and atraumatic.   Nose: No congestion/rhinnorhea.   Mouth/Throat: Mucous membranes are moist.   Neck: No stridor. Cardiovascular: Normal rate, regular rhythm. No murmurs, rubs, or gallops. Respiratory: Normal respiratory effort without tachypnea nor retractions. Breath sounds are clear and equal bilaterally. No wheezes/rales/rhonchi. Gastrointestinal: Soft and nontender. Normal bowel sounds Musculoskeletal: Nontender with normal range of motion in extremities. No lower extremity tenderness nor edema. Neurologic:  Normal speech and language. No gross focal neurologic deficits are appreciated.  Skin:  Skin is warm, dry and intact. No rash noted. Psychiatric: Mood and affect are normal. Speech and behavior are normal.  ____________________________________________  ED COURSE:  Pertinent labs &  imaging results that were available during my care of the patient were reviewed by me and considered in my medical decision making (see chart for details). Patient presents for Hyperglycemia, we will assess with labs and imaging as  indicated. Clinical Course as of Sep 12 2142  Thu Sep 11, 2016  2143 Current blood sugar is 254 and her headache is improved. Advised her she is not in DKA or in a hyperosmolar state. I have advised that she increase her home insulin and call endocrinology tomorrow.  [JW]    Clinical Course User Index [JW] Earleen Newport, MD   Procedures ____________________________________________   LABS (pertinent positives/negatives)  Labs Reviewed  BASIC METABOLIC PANEL - Abnormal; Notable for the following:       Result Value   Sodium 131 (*)    Chloride 100 (*)    CO2 21 (*)    Glucose, Bld 442 (*)    All other components within normal limits  CBC - Abnormal; Notable for the following:    RBC 5.32 (*)    Hemoglobin 10.3 (*)    HCT 33.9 (*)    MCV 63.6 (*)    MCH 19.3 (*)    MCHC 30.4 (*)    RDW 20.3 (*)    All other components within normal limits  URINALYSIS, COMPLETE (UACMP) WITH MICROSCOPIC - Abnormal; Notable for the following:    Color, Urine STRAW (*)    APPearance CLEAR (*)    Glucose, UA >=500 (*)    Hgb urine dipstick SMALL (*)    Ketones, ur 20 (*)    Squamous Epithelial / LPF 0-5 (*)    All other components within normal limits  GLUCOSE, CAPILLARY - Abnormal; Notable for the following:    Glucose-Capillary 442 (*)    All other components within normal limits  GLUCOSE, CAPILLARY - Abnormal; Notable for the following:    Glucose-Capillary 386 (*)    All other components within normal limits  GLUCOSE, CAPILLARY - Abnormal; Notable for the following:    Glucose-Capillary 254 (*)    All other components within normal limits  CBG MONITORING, ED  CBG MONITORING, ED  CBG MONITORING, ED   ____________________________________________  FINAL ASSESSMENT AND PLAN  Hyperglycemia  Plan: Patient's labs and imaging were dictated above. Patient had presented for Hyperglycemia. She has a normal anion gap and normal serum osmolarity. She was given 2 L of fluid as well as  subcutaneous insulin and morphine for headache.Currently she is feeling better and I will advise close outpatient follow-up with endocrinology.   Earleen Newport, MD   Note: This note was generated in part or whole with voice recognition software. Voice recognition is usually quite accurate but there are transcription errors that can and very often do occur. I apologize for any typographical errors that were not detected and corrected.     Earleen Newport, MD 09/11/16 [phone]

## 2016-09-23 ENCOUNTER — Emergency Department
Admission: EM | Admit: 2016-09-23 | Discharge: 2016-09-23 | Disposition: A | Discharge status: Home / Self Care | Payer: Self-pay | Attending: Emergency Medicine | Admitting: Emergency Medicine

## 2016-09-23 ENCOUNTER — Encounter: Payer: Self-pay | Admitting: Emergency Medicine

## 2016-09-23 ENCOUNTER — Emergency Department: Payer: Self-pay

## 2016-09-23 DIAGNOSIS — R739 Hyperglycemia, unspecified: Secondary | ICD-10-CM

## 2016-09-23 DIAGNOSIS — Z794 Long term (current) use of insulin: Secondary | ICD-10-CM | POA: Insufficient documentation

## 2016-09-23 DIAGNOSIS — I10 Essential (primary) hypertension: Secondary | ICD-10-CM | POA: Insufficient documentation

## 2016-09-23 DIAGNOSIS — Z79899 Other long term (current) drug therapy: Secondary | ICD-10-CM | POA: Insufficient documentation

## 2016-09-23 DIAGNOSIS — E1165 Type 2 diabetes mellitus with hyperglycemia: Secondary | ICD-10-CM | POA: Insufficient documentation

## 2016-09-23 DIAGNOSIS — F17210 Nicotine dependence, cigarettes, uncomplicated: Secondary | ICD-10-CM | POA: Insufficient documentation

## 2016-09-23 LAB — URINALYSIS, COMPLETE (UACMP) WITH MICROSCOPIC
Bilirubin Urine: NEGATIVE
Hgb urine dipstick: NEGATIVE
KETONES UR: 20 mg/dL — AB
LEUKOCYTES UA: NEGATIVE
Nitrite: NEGATIVE
PH: 6 (ref 5.0–8.0)
Protein, ur: NEGATIVE mg/dL
SPECIFIC GRAVITY, URINE: 1.023 (ref 1.005–1.030)

## 2016-09-23 LAB — BASIC METABOLIC PANEL
Anion gap: 11 (ref 5–15)
BUN: 15 mg/dL (ref 6–20)
CO2: 24 mmol/L (ref 22–32)
CREATININE: 0.91 mg/dL (ref 0.44–1.00)
Calcium: 9.4 mg/dL (ref 8.9–10.3)
Chloride: 95 mmol/L — ABNORMAL LOW (ref 101–111)
GFR calc Af Amer: >60 mL/min (ref >60)
GLUCOSE: 553 mg/dL — AB (ref 65–99)
Potassium: 4 mmol/L (ref 3.5–5.1)
Sodium: 130 mmol/L — ABNORMAL LOW (ref 135–145)

## 2016-09-23 LAB — GLUCOSE, CAPILLARY
GLUCOSE-CAPILLARY: 539 mg/dL — AB (ref 65–99)
GLUCOSE-CAPILLARY: 301 mg/dL — AB (ref 65–99)
Glucose-Capillary: 316 mg/dL — ABNORMAL HIGH (ref 65–99)

## 2016-09-23 LAB — CBC
HCT: 31.8 % — ABNORMAL LOW (ref 35.0–47.0)
Hemoglobin: 9.8 g/dL — ABNORMAL LOW (ref 12.0–16.0)
MCH: 19.6 pg — ABNORMAL LOW (ref 26.0–34.0)
MCHC: 30.8 g/dL — AB (ref 32.0–36.0)
MCV: 63.7 fL — ABNORMAL LOW (ref 80.0–100.0)
PLATELETS: 343 10*3/uL (ref 150–440)
RBC: 5 MIL/uL (ref 3.80–5.20)
RDW: 20.5 % — AB (ref 11.5–14.5)
WBC: 8.7 10*3/uL (ref 3.6–11.0)

## 2016-09-23 LAB — PREGNANCY, URINE: Preg Test, Ur: NEGATIVE

## 2016-09-23 MED ORDER — FENTANYL CITRATE (PF) 100 MCG/2ML IJ SOLN
50.0000 ug | Freq: Once | INTRAMUSCULAR | Status: AC
  Administered 2016-09-23: 50 ug via INTRAVENOUS
  Filled 2016-09-23: qty 2

## 2016-09-23 MED ORDER — SODIUM CHLORIDE 0.9 % IV BOLUS (SEPSIS)
1000.0000 mL | Freq: Once | INTRAVENOUS | Status: AC
  Administered 2016-09-23: 1000 mL via INTRAVENOUS

## 2016-09-23 MED ORDER — IOPAMIDOL (ISOVUE-300) INJECTION 61%
30.0000 mL | Freq: Once | INTRAVENOUS | Status: AC | PRN
  Administered 2016-09-23: 30 mL via ORAL

## 2016-09-23 MED ORDER — IOPAMIDOL (ISOVUE-300) INJECTION 61%
100.0000 mL | Freq: Once | INTRAVENOUS | Status: AC | PRN
  Administered 2016-09-23: 100 mL via INTRAVENOUS

## 2016-09-23 NOTE — Discharge Instructions (Signed)
Seizure insulin as directed. Please return for any further problems or should blood sugar goes up or your headache gets worse again. Also worse return for fever or feeling sicker.

## 2016-09-23 NOTE — ED Triage Notes (Signed)
Pt to ed with c/o elevated blood sugar, blurred vision, and frequency of urination and increased thirst over the past week.  Pt states was seen here for same last week, increased insulin without relief of symptoms.

## 2016-09-23 NOTE — ED Notes (Signed)
cbg 316

## 2016-09-23 NOTE — ED Provider Notes (Signed)
[facility] Emergency Department Provider Note   ____________________________________________   First MD Initiated Contact with Patient 09/23/16 1527     (approximate)  I have reviewed the triage vital signs and the nursing notes.   HISTORY  Chief Complaint Hyperglycemia and Blurred Vision   HPI Jennifer [name] is a 36 y.o. female who comes in complaining of left sided back pain and headache. She says she was here for the same thing last week but this got worse. Her blood sugars pain going high a lot especially lately but really for the last 3 years. She also says her vision is blurry. She's had increased frequency of urination and thirst in the last week. She is not running a fever she is not really coughing or short of breath either she says she's got no pain in the anterior belly. No dysuria.   Past Medical History:  Diagnosis Date  . Diabetes mellitus without complication ([place])   . Hypertension     Patient Active Problem List   Diagnosis Date Noted  . Type 2 diabetes mellitus with hyperosmolar nonketotic hyperglycemia ([place]) 10/25/2015  . Constipation 10/25/2015  . OSA (obstructive sleep apnea) 10/25/2015  . Headache 10/25/2015  . Obesity 10/25/2015  . Essential hypertension 10/25/2015  . Hyperglycemia without ketosis 10/21/2015  . Diabetic ketoacidosis associated with type 1 diabetes mellitus ([place]) 10/06/2015  . Hyperglycemia 11/13/2014    Past Surgical History:  Procedure Laterality Date  . none      Prior to Admission medications   Medication Sig Start Date End Date Taking? Authorizing Provider  insulin aspart (NOVOLOG) 100 UNIT/ML injection Inject 20 Units into the skin 3 (three) times daily with meals. Patient taking differently: Inject 26 Units into the skin 3 (three) times daily with meals.  10/25/15  Yes Theodoro Grist, MD  insulin detemir (LEVEMIR) 100 UNIT/ML injection Inject 34 Units into the skin at bedtime.   Yes [provider]  metFORMIN (GLUCOPHAGE) 1000 MG tablet Take 1 tablet (1,000 mg total) by mouth 2 (two) times daily with a meal. 10/25/15  Yes Theodoro Grist, MD  butalbital-acetaminophen-caffeine (FIORICET, ESGIC) 50-325-40 MG tablet Take 1 tablet by mouth every 6 (six) hours as needed for headache or migraine. Patient not taking: Reported on 10/21/2015 10/09/15   Vaughan Basta, MD  butalbital-acetaminophen-caffeine (FIORICET, ESGIC) [id number] MG tablet Take 1-2 tablets by mouth every 6 (six) hours as needed for headache. Patient not taking: Reported on 09/23/2016 09/11/16 09/11/17  Earleen Newport, MD  docusate sodium (COLACE) 100 MG capsule Take 1 capsule (100 mg total) by mouth 2 (two) times daily as needed for mild constipation. Patient not taking: Reported on 09/23/2016 10/25/15   Theodoro Grist, MD  ibuprofen (ADVIL,MOTRIN) 600 MG tablet Take 1 tablet (600 mg total) by mouth every 6 (six) hours as needed for moderate pain. Patient not taking: Reported on 09/23/2016 05/11/16   Duanne Guess, PA-C  lisinopril (PRINIVIL,ZESTRIL) 20 MG tablet Take 20 mg by mouth every morning.    [provider]  traMADol (ULTRAM) 50 MG tablet Take 1 tablet (50 mg total) by mouth every 6 (six) hours as needed. Patient not taking: Reported on 09/23/2016 05/11/16   Duanne Guess, PA-C    Allergies Demerol [meperidine]; Oxycodone; and Toradol [ketorolac tromethamine]  Family History  Problem Relation Age of Onset  . Diabetes Unknown   . Hypertension Unknown     Social History Social History  Substance Use Topics  . Smoking status: Current  Every Day Smoker    Packs/day: 0.00  . Smokeless tobacco: Never Used  . Alcohol use No    Review of Systems  Constitutional: No fever/chills Eyes: No visual changes. ENT: No sore throat. Cardiovascular: Denies chest pain. Respiratory: Denies shortness of breath. Gastrointestinal:See history of present illness Genitourinary: Negative for  dysuria. Musculoskeletal: Negative for back pain. Skin: Negative for rash. Neurological: Negative for headaches, focal weakness   ____________________________________________   PHYSICAL EXAM:  VITAL SIGNS: ED Triage Vitals [09/23/16 1435]  Enc Vitals Group     BP (!) 174/104     Pulse Rate (!) 113     Resp 18     Temp 99.3 F (37.4 C)     Temp Source Oral     SpO2 96 %     Weight 198 lb (89.8 kg)     Height      Head Circumference      Peak Flow      Pain Score 0     Pain Loc      Pain Edu?      Excl. in [place]?     Constitutional: Alert and oriented. Well appearing and in no acute distress. Eyes: Conjunctivae are normal. PERRL. EOMI.Fundi are hard to see Head: Atraumatic. Nose: No congestion/rhinnorhea. Mouth/Throat: Mucous membranes are moist.  Oropharynx non-erythematous. Neck: No stridor.  Cardiovascular: Normal rate, regular rhythm. Grossly normal heart sounds.  Good peripheral circulation. Respiratory: Normal respiratory effort.  No retractions. Lungs CTAB. Gastrointestinal: Soft and nontender. No distention. No abdominal bruits. No CVA tenderness. Back is tender on the left side even in the CVA area }Musculoskeletal: No lower extremity tenderness nor edema.  No joint effusions. Neurologic:  Normal speech and language. No gross focal neurologic deficits are appreciated. No nerves II through XII are intact except visual fields are not checked motor strength is 5 over 5 throughout finger-nose and rapid alternating movements and hands are normal Skin:  Skin is warm, dry and intact. No rash noted. Psychiatric: Mood and affect are normal. Speech and behavior are normal.  ____________________________________________   LABS (all labs ordered are listed, but only abnormal results are displayed)  Labs Reviewed  BASIC METABOLIC PANEL - Abnormal; Notable for the following:       Result Value   Sodium 130 (*)    Chloride 95 (*)    Glucose, Bld 553 (*)    All other  components within normal limits  CBC - Abnormal; Notable for the following:    Hemoglobin 9.8 (*)    HCT 31.8 (*)    MCV 63.7 (*)    MCH 19.6 (*)    MCHC 30.8 (*)    RDW 20.5 (*)    All other components within normal limits  URINALYSIS, COMPLETE (UACMP) WITH MICROSCOPIC - Abnormal; Notable for the following:    Color, Urine COLORLESS (*)    APPearance CLEAR (*)    Glucose, UA >=500 (*)    Ketones, ur 20 (*)    Bacteria, UA RARE (*)    Squamous Epithelial / LPF 0-5 (*)    All other components within normal limits  GLUCOSE, CAPILLARY - Abnormal; Notable for the following:    Glucose-Capillary 564 (*)    All other components within normal limits  GLUCOSE, CAPILLARY - Abnormal; Notable for the following:    Glucose-Capillary 539 (*)    All other components within normal limits  GLUCOSE, CAPILLARY - Abnormal; Notable for the following:    Glucose-Capillary 316 (*)  All other components within normal limits  PREGNANCY, URINE  CBG MONITORING, ED  CBG MONITORING, ED   ____________________________________________  EKG  EKG read and interpreted by me shows normal sinus rhythm rate of 96 normal axis no acute ST-T wave changes ____________________________________________  RADIOLOGY  IMPRESSION: No acute intracranial abnormality.   Electronically Signed   By: Fidela Salisbury M.D.   On: 09/23/2016 16:52 __IMPRESSION: 1.  No acute process in the abdomen or pelvis. 2. Hepatic steatosis and hepatomegaly. 3. Bilateral nephrolithiasis without obstructive uropathy. 4. Polycystic kidney disease. The majority of lesions are simple. Some lesions demonstrate complexity, including a subcentimeter left lesion detailed above. If definitive characterization is desired, the test of choice is nonemergent pre and post contrast abdominal MRI. 5. Uterine fibroids.   Electronically Signed   By: Abigail Miyamoto M.D.   On: 09/23/2016  17:20 __________________________________________   PROCEDURES  Procedure(s) performed:  Procedures  Critical Care performed:   ____________________________________________   INITIAL IMPRESSION / ASSESSMENT AND PLAN / ED COURSE  Pertinent labs & imaging results that were available during my care of the patient were reviewed by me and considered in my medical decision making (see chart for details).  After 2 L of fluid patient feels much better I'll plan on letting her go  Clinical Course as of Sep 23 2113  Tue Sep 23, 2016  1527 Hemoglobin: Marland Kitchen 9.8 [PM]    Clinical Course User Index [PM] Nena Polio, MD     ____________________________________________   FINAL CLINICAL IMPRESSION(S) / ED DIAGNOSES  Final diagnoses:  Hyperglycemia      NEW MEDICATIONS STARTED DURING THIS VISIT:  New Prescriptions   No medications on file     Note:  This document was prepared using Dragon voice recognition software and may include unintentional dictation errors.    Nena Polio, MD 09/23/16 2115

## 2016-09-23 NOTE — ED Notes (Signed)
Pt presents with right sided abdominal pain and headache. States she was seen here for the same last week, and it has gotten worse. Pt states she has had trouble regulating her blood glucose for the last 3 years. Pt alert & oriented with NAD noted.

## 2016-09-23 NOTE — ED Notes (Signed)
poc pregnancy test negative

## 2016-09-25 LAB — GLUCOSE, CAPILLARY: Glucose-Capillary: 564 mg/dL (ref 65–99)

## 2016-10-08 ENCOUNTER — Emergency Department
Admission: EM | Admit: 2016-10-08 | Discharge: 2016-10-08 | Disposition: A | Discharge status: Home / Self Care | Payer: Self-pay | Attending: Student in an Organized Health Care Education/Training Program | Admitting: Student in an Organized Health Care Education/Training Program

## 2016-10-08 DIAGNOSIS — Z794 Long term (current) use of insulin: Secondary | ICD-10-CM | POA: Insufficient documentation

## 2016-10-08 DIAGNOSIS — F17200 Nicotine dependence, unspecified, uncomplicated: Secondary | ICD-10-CM | POA: Insufficient documentation

## 2016-10-08 DIAGNOSIS — I10 Essential (primary) hypertension: Secondary | ICD-10-CM | POA: Insufficient documentation

## 2016-10-08 DIAGNOSIS — E860 Dehydration: Secondary | ICD-10-CM | POA: Insufficient documentation

## 2016-10-08 DIAGNOSIS — E1165 Type 2 diabetes mellitus with hyperglycemia: Secondary | ICD-10-CM | POA: Insufficient documentation

## 2016-10-08 DIAGNOSIS — R739 Hyperglycemia, unspecified: Secondary | ICD-10-CM

## 2016-10-08 DIAGNOSIS — Z79899 Other long term (current) drug therapy: Secondary | ICD-10-CM | POA: Insufficient documentation

## 2016-10-08 LAB — BASIC METABOLIC PANEL
Anion gap: 10 (ref 5–15)
BUN: 16 mg/dL (ref 6–20)
CHLORIDE: 92 mmol/L — AB (ref 101–111)
CO2: 23 mmol/L (ref 22–32)
Calcium: 9.7 mg/dL (ref 8.9–10.3)
Creatinine, Ser: 1.04 mg/dL — ABNORMAL HIGH (ref 0.44–1.00)
GFR calc Af Amer: >60 mL/min (ref >60)
GFR calc non Af Amer: >60 mL/min (ref >60)
Glucose, Bld: 685 mg/dL (ref 65–99)
POTASSIUM: 4.5 mmol/L (ref 3.5–5.1)
SODIUM: 125 mmol/L — AB (ref 135–145)

## 2016-10-08 LAB — URINALYSIS, COMPLETE (UACMP) WITH MICROSCOPIC
BACTERIA UA: NONE SEEN
Bilirubin Urine: NEGATIVE
Glucose, UA: >=500 mg/dL — AB
Hgb urine dipstick: NEGATIVE
KETONES UR: 5 mg/dL — AB
LEUKOCYTES UA: NEGATIVE
Nitrite: NEGATIVE
PH: 6 (ref 5.0–8.0)
PROTEIN: NEGATIVE mg/dL
Specific Gravity, Urine: 1.026 (ref 1.005–1.030)

## 2016-10-08 LAB — CBC
HEMATOCRIT: 35.4 % (ref 35.0–47.0)
Hemoglobin: 11.1 g/dL — ABNORMAL LOW (ref 12.0–16.0)
MCH: 20.7 pg — ABNORMAL LOW (ref 26.0–34.0)
MCHC: 31.3 g/dL — ABNORMAL LOW (ref 32.0–36.0)
MCV: 66.2 fL — AB (ref 80.0–100.0)
Platelets: 286 10*3/uL (ref 150–440)
RBC: 5.36 MIL/uL — ABNORMAL HIGH (ref 3.80–5.20)
RDW: 19.8 % — AB (ref 11.5–14.5)
WBC: 9.7 10*3/uL (ref 3.6–11.0)

## 2016-10-08 LAB — BLOOD GAS, VENOUS
Acid-base deficit: 0.4 mmol/L (ref 0.0–2.0)
Bicarbonate: 24.1 mmol/L (ref 20.0–28.0)
O2 Saturation: 81.2 %
PH VEN: 7.41 (ref 7.250–7.430)
Patient temperature: 37
pCO2, Ven: 38 mmHg — ABNORMAL LOW (ref 44.0–60.0)
pO2, Ven: 45 mmHg (ref 32.0–45.0)

## 2016-10-08 LAB — GLUCOSE, CAPILLARY
Glucose-Capillary: >600 mg/dL (ref 65–99)
Glucose-Capillary: 362 mg/dL — ABNORMAL HIGH (ref 65–99)

## 2016-10-08 LAB — POCT PREGNANCY, URINE: PREG TEST UR: NEGATIVE

## 2016-10-08 MED ORDER — PROCHLORPERAZINE EDISYLATE 5 MG/ML IJ SOLN
10.0000 mg | Freq: Once | INTRAMUSCULAR | Status: AC
  Administered 2016-10-08: 10 mg via INTRAVENOUS
  Filled 2016-10-08: qty 2

## 2016-10-08 MED ORDER — SODIUM CHLORIDE 0.9 % IV BOLUS (SEPSIS)
1000.0000 mL | Freq: Once | INTRAVENOUS | Status: AC
  Administered 2016-10-08: 1000 mL via INTRAVENOUS

## 2016-10-08 MED ORDER — INSULIN ASPART 100 UNIT/ML ~~LOC~~ SOLN
10.0000 [IU] | Freq: Once | SUBCUTANEOUS | Status: AC
  Administered 2016-10-08: 10 [IU] via INTRAVENOUS
  Filled 2016-10-08: qty 0.1

## 2016-10-08 MED ORDER — INSULIN DETEMIR 100 UNIT/ML ~~LOC~~ SOLN
30.0000 [IU] | Freq: Once | SUBCUTANEOUS | Status: AC
  Administered 2016-10-08: 30 [IU] via SUBCUTANEOUS
  Filled 2016-10-08: qty 0.3

## 2016-10-08 MED ORDER — PROCHLORPERAZINE EDISYLATE 5 MG/ML IJ SOLN
INTRAMUSCULAR | Status: AC
  Administered 2016-10-08: 10 mg via INTRAVENOUS
  Filled 2016-10-08: qty 2

## 2016-10-08 MED ORDER — ACETAMINOPHEN 500 MG PO TABS
1000.0000 mg | ORAL_TABLET | Freq: Once | ORAL | Status: AC
  Administered 2016-10-08: 1000 mg via ORAL
  Filled 2016-10-08: qty 2

## 2016-10-08 MED ORDER — INSULIN ASPART 100 UNIT/ML ~~LOC~~ SOLN
SUBCUTANEOUS | Status: AC
  Administered 2016-10-08: 10 [IU] via INTRAVENOUS
  Filled 2016-10-08: qty 1

## 2016-10-08 MED ORDER — ACETAMINOPHEN 500 MG PO TABS
ORAL_TABLET | ORAL | Status: AC
  Administered 2016-10-08: 1000 mg via ORAL
  Filled 2016-10-08: qty 2

## 2016-10-08 NOTE — ED Provider Notes (Signed)
[facility] Emergency Department Provider Note    First MD Initiated Contact with Patient 10/08/16 2003     (approximate)  I have reviewed the triage vital signs and the nursing notes.   HISTORY  Chief Complaint Hyperglycemia    HPI Jennifer [name] is a 36 y.o. female history of insulin-dependent diabetes with admissions to the hospital for age H and S as well as DKA presents with elevated glucose for the past several days as well as generalized malaise. Patient does complain of a mild headache right now but denies any focal neurodeficits. No nausea or vomiting. Denies any fevers. No chest pain or shortness of breath. No dysuria but does admit to increased frequency. States that she has been compliant with her insulin but ran out yesterday and has not gotten a refill today.   Past Medical History:  Diagnosis Date  . Diabetes mellitus without complication ([place])   . Hypertension    Family History  Problem Relation Age of Onset  . Diabetes Unknown   . Hypertension Unknown    Past Surgical History:  Procedure Laterality Date  . CESAREAN SECTION     x2  . none     Patient Active Problem List   Diagnosis Date Noted  . Type 2 diabetes mellitus with hyperosmolar nonketotic hyperglycemia ([place]) 10/25/2015  . Constipation 10/25/2015  . OSA (obstructive sleep apnea) 10/25/2015  . Headache 10/25/2015  . Obesity 10/25/2015  . Essential hypertension 10/25/2015  . Hyperglycemia without ketosis 10/21/2015  . Diabetic ketoacidosis associated with type 1 diabetes mellitus ([place]) 10/06/2015  . Hyperglycemia 11/13/2014      Prior to Admission medications   Medication Sig Start Date End Date Taking? Authorizing Provider  butalbital-acetaminophen-caffeine (FIORICET, ESGIC) 50-325-40 MG tablet Take 1 tablet by mouth every 6 (six) hours as needed for headache or migraine. Patient not taking: Reported on 10/21/2015 10/09/15   Vaughan Basta, MD    butalbital-acetaminophen-caffeine (FIORICET, ESGIC) 5410326122 MG tablet Take 1-2 tablets by mouth every 6 (six) hours as needed for headache. Patient not taking: Reported on 09/23/2016 09/11/16 09/11/17  Earleen Newport, MD  docusate sodium (COLACE) 100 MG capsule Take 1 capsule (100 mg total) by mouth 2 (two) times daily as needed for mild constipation. Patient not taking: Reported on 09/23/2016 10/25/15   Theodoro Grist, MD  ibuprofen (ADVIL,MOTRIN) 600 MG tablet Take 1 tablet (600 mg total) by mouth every 6 (six) hours as needed for moderate pain. Patient not taking: Reported on 09/23/2016 05/11/16   Duanne Guess, PA-C  insulin aspart (NOVOLOG) 100 UNIT/ML injection Inject 20 Units into the skin 3 (three) times daily with meals. Patient taking differently: Inject 26 Units into the skin 3 (three) times daily with meals.  10/25/15   Theodoro Grist, MD  insulin detemir (LEVEMIR) 100 UNIT/ML injection Inject 34 Units into the skin at bedtime.    [provider]  lisinopril (PRINIVIL,ZESTRIL) 20 MG tablet Take 20 mg by mouth every morning.    [provider]  metFORMIN (GLUCOPHAGE) 1000 MG tablet Take 1 tablet (1,000 mg total) by mouth 2 (two) times daily with a meal. 10/25/15   Theodoro Grist, MD  traMADol (ULTRAM) 50 MG tablet Take 1 tablet (50 mg total) by mouth every 6 (six) hours as needed. Patient not taking: Reported on 09/23/2016 05/11/16   Duanne Guess, PA-C    Allergies Demerol [meperidine]; Oxycodone; and Toradol [ketorolac tromethamine]    Social History Social History  Substance Use Topics  .  Smoking status: Current Every Day Smoker    Packs/day: 0.00  . Smokeless tobacco: Never Used  . Alcohol use No    Review of Systems Patient denies headaches, rhinorrhea, blurry vision, numbness, shortness of breath, chest pain, edema, cough, abdominal pain, nausea, vomiting, diarrhea, dysuria, fevers, rashes or hallucinations unless otherwise stated above in  HPI. ____________________________________________   PHYSICAL EXAM:  VITAL SIGNS: Vitals:   10/08/16 2039 10/08/16 2314  BP: (!) 140/92 (!) 154/91  Pulse: 92 73  Resp: 18 17  Temp:  98.4 F (36.9 C)  SpO2: 98% 100%    Constitutional: Alert and oriented. Well appearing and in no acute distress. Eyes: Conjunctivae are normal.  Head: Atraumatic. Nose: No congestion/rhinnorhea. Mouth/Throat: Mucous membranes are moist.   Neck: No stridor. Painless ROM.  Cardiovascular: Normal rate, regular rhythm. Grossly normal heart sounds.  Good peripheral circulation. Respiratory: Normal respiratory effort.  No retractions. Lungs CTAB. Gastrointestinal: Soft and nontender. No distention. No abdominal bruits. No CVA tenderness. Genitourinary:  Musculoskeletal: No lower extremity tenderness nor edema.  No joint effusions. Neurologic:  Normal speech and language. No gross focal neurologic deficits are appreciated. No facial droop Skin:  Skin is warm, dry and intact. No rash noted. Psychiatric: Mood and affect are normal. Speech and behavior are normal.  ____________________________________________   LABS (all labs ordered are listed, but only abnormal results are displayed)  Results for orders placed or performed during the hospital encounter of 10/08/16 (from the past 24 hour(s))  Glucose, capillary     Status: Abnormal   Collection Time: 10/08/16  6:16 PM  Result Value Ref Range   Glucose-Capillary >600 (HH) 65 - 99 mg/dL  Basic metabolic panel     Status: Abnormal   Collection Time: 10/08/16  6:20 PM  Result Value Ref Range   Sodium 125 (L) 135 - 145 mmol/L   Potassium 4.5 3.5 - 5.1 mmol/L   Chloride 92 (L) 101 - 111 mmol/L   CO2 23 22 - 32 mmol/L   Glucose, Bld 685 (HH) 65 - 99 mg/dL   BUN 16 6 - 20 mg/dL   Creatinine, Ser 1.04 (H) 0.44 - 1.00 mg/dL   Calcium 9.7 8.9 - 10.3 mg/dL   GFR calc non Af Amer >60 >60 mL/min   GFR calc Af Amer >60 >60 mL/min   Anion gap 10 5 - 15  CBC      Status: Abnormal   Collection Time: 10/08/16  6:20 PM  Result Value Ref Range   WBC 9.7 3.6 - 11.0 K/uL   RBC 5.36 (H) 3.80 - 5.20 MIL/uL   Hemoglobin 11.1 (L) 12.0 - 16.0 g/dL   HCT 35.4 35.0 - 47.0 %   MCV 66.2 (L) 80.0 - 100.0 fL   MCH 20.7 (L) 26.0 - 34.0 pg   MCHC 31.3 (L) 32.0 - 36.0 g/dL   RDW 19.8 (H) 11.5 - 14.5 %   Platelets 286 150 - 440 K/uL  Urinalysis, Complete w Microscopic     Status: Abnormal   Collection Time: 10/08/16  6:20 PM  Result Value Ref Range   Color, Urine COLORLESS (A) YELLOW   APPearance CLEAR (A) CLEAR   Specific Gravity, Urine 1.026 1.005 - 1.030   pH 6.0 5.0 - 8.0   Glucose, UA >=500 (A) NEGATIVE mg/dL   Hgb urine dipstick NEGATIVE NEGATIVE   Bilirubin Urine NEGATIVE NEGATIVE   Ketones, ur 5 (A) NEGATIVE mg/dL   Protein, ur NEGATIVE NEGATIVE mg/dL   Nitrite NEGATIVE NEGATIVE  Leukocytes, UA NEGATIVE NEGATIVE   RBC / HPF 0-5 0 - 5 RBC/hpf   WBC, UA 0-5 0 - 5 WBC/hpf   Bacteria, UA NONE SEEN NONE SEEN   Squamous Epithelial / LPF 0-5 (A) NONE SEEN  Pregnancy, urine POC     Status: None   Collection Time: 10/08/16  6:24 PM  Result Value Ref Range   Preg Test, Ur NEGATIVE NEGATIVE  Blood gas, venous     Status: Abnormal   Collection Time: 10/08/16  6:24 PM  Result Value Ref Range   pH, Ven 7.41 7.250 - 7.430   pCO2, Ven 38 (L) 44.0 - 60.0 mmHg   pO2, Ven 45.0 32.0 - 45.0 mmHg   Bicarbonate 24.1 20.0 - 28.0 mmol/L   Acid-base deficit 0.4 0.0 - 2.0 mmol/L   O2 Saturation 81.2 %   Patient temperature 37.0    Collection site VEIN    Sample type VEIN   Glucose, capillary     Status: Abnormal   Collection Time: 10/08/16 10:02 PM  Result Value Ref Range   Glucose-Capillary 362 (H) 65 - 99 mg/dL   ____________________________________________ _____________________________  RADIOLOGY   ____________________________________________   PROCEDURES  Procedure(s) performed:  Procedures    Critical Care performed:  no ____________________________________________   INITIAL IMPRESSION / ASSESSMENT AND PLAN / ED COURSE  Pertinent labs & imaging results that were available during my care of the patient were reviewed by me and considered in my medical decision making (see chart for details).  DDX: hhns, dka, dehydration, Non compliance, uti  Jennifer [name] is a 36 y.o. who presents to the ED with chief complaint of persistently elevated blood sugars and feeling dehydrated and generalized body aches. Blood work sent for the above differential shows markedly elevated glucose but no evidence of DKA or metabolic acidosis. Less consistent with hyperosmolar state. Neuro exam is nonfocal. Patient was given IV fluids in restarted on her home insulin with improvement in her glucose. Patient has prescriptions available tomorrow to pick him up. No incidence of infectious process. Patient was encouraged to increase oral hydration and follow up with her pcp.  she demonstrates understanding of signs and symptoms for which she should return to the ER.      ____________________________________________   FINAL CLINICAL IMPRESSION(S) / ED DIAGNOSES  Final diagnoses:  Hyperglycemia  Dehydration      NEW MEDICATIONS STARTED DURING THIS VISIT:  Discharge Medication List as of 10/08/2016 10:28 PM       Note:  This document was prepared using Dragon voice recognition software and may include unintentional dictation errors.    Merlyn Lot, MD 10/08/16 [phone]

## 2016-10-08 NOTE — ED Triage Notes (Signed)
Pt c/o FS >600 for the past 3 days and before that it was running in the 400's with generalized bodyaches.Marland Kitchen

## 2017-12-02 IMAGING — CT CT HEAD W/O CM
3 series · 15 of 47 positions shown, 18 images · non-contrast
Comparison: 05/29/2013

CLINICAL DATA: 35-year-old female with a history of headache and
nausea

EXAM:
CT HEAD WITHOUT CONTRAST
TECHNIQUE: Contiguous axial images were obtained from the base of the skull
through the vertex without intravenous contrast.

[Series 2: head wo · axial · 0.40mm/px · z∈[-112,+13]mm · 9 of 30 slices shown, 12 images]
[im 3/30  brain]
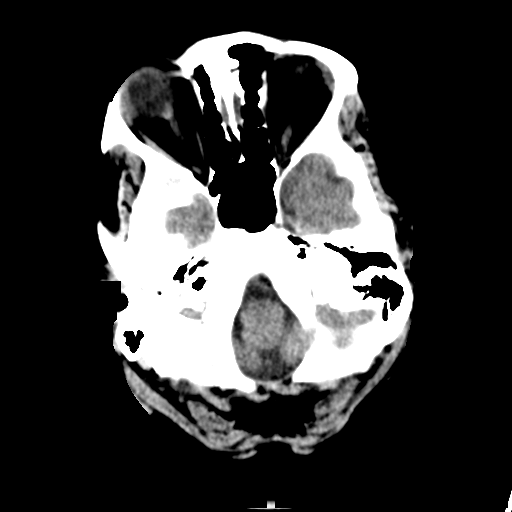
[im 3/30  bone]
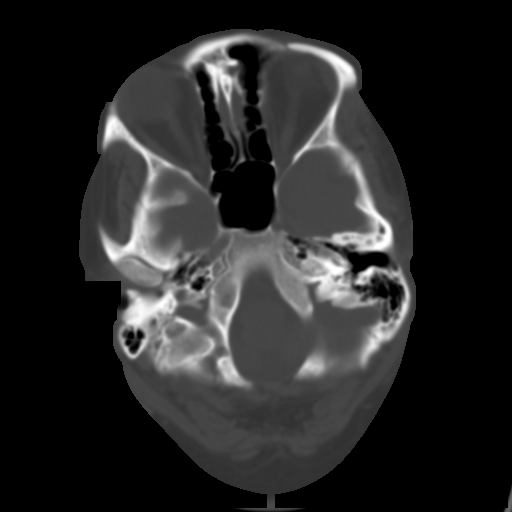
[im 6/30  brain]
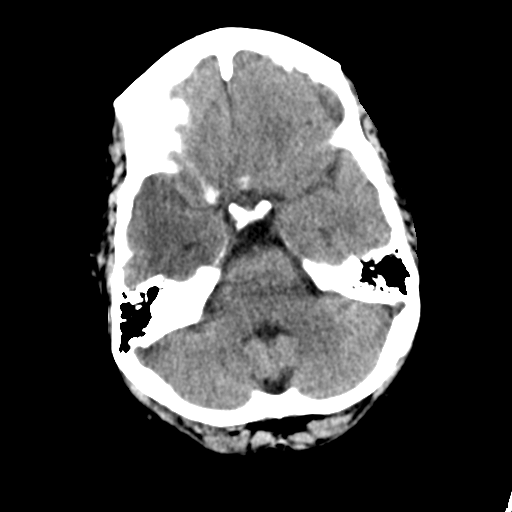
[im 9/30  brain]
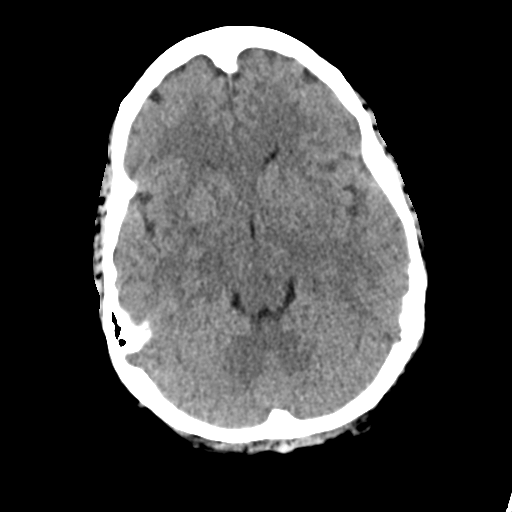
[im 12/30  brain]
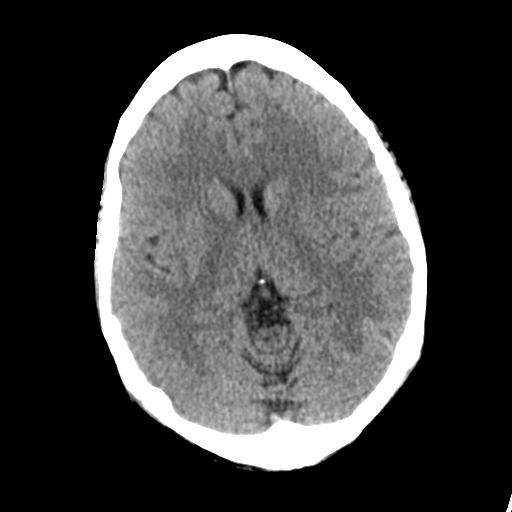
[im 16/30  brain]
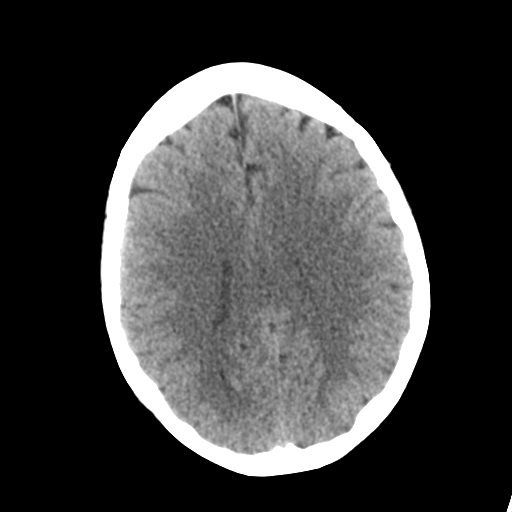
[im 16/30  bone]
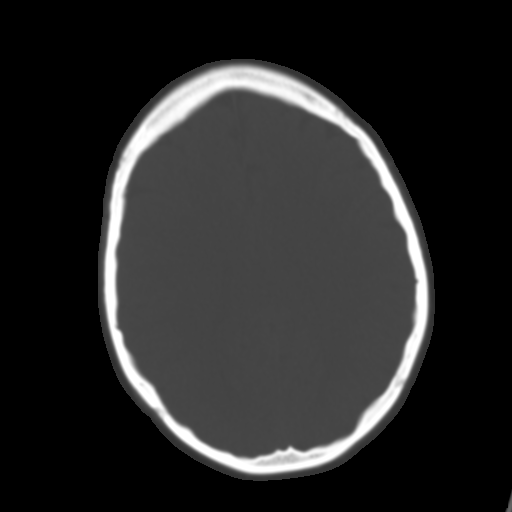
[im 19/30  brain]
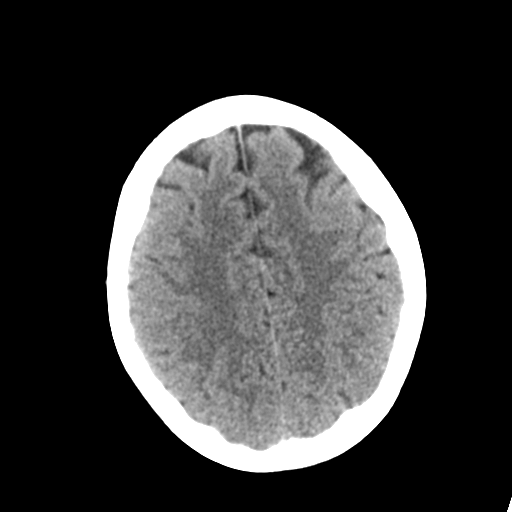
[im 22/30  brain]
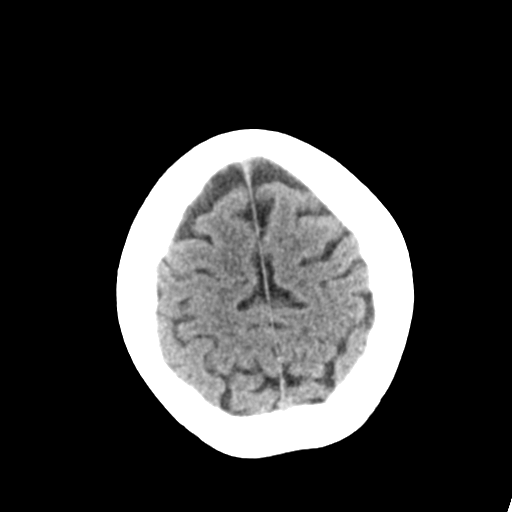
[im 25/30  brain]
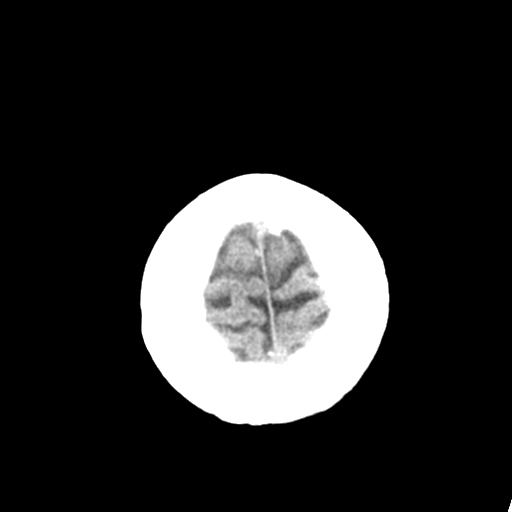
[im 28/30  brain]
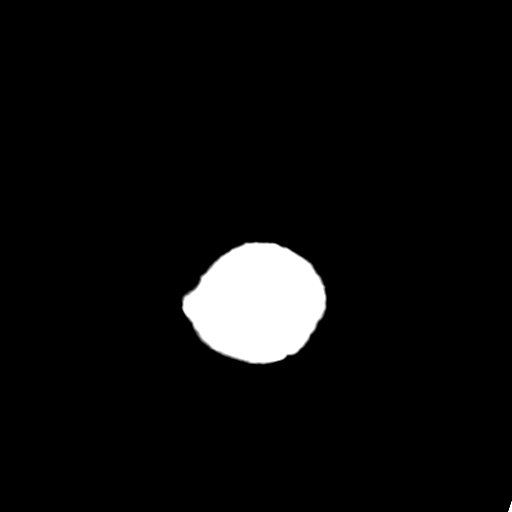
[im 28/30  bone]
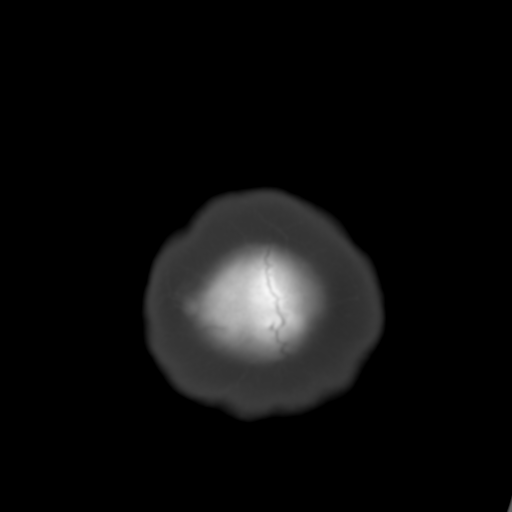

[Series 4: coronal soft tissue · coronal · 0.30mm/px · 3 of 63 slices shown]
[im 21/63  brain]
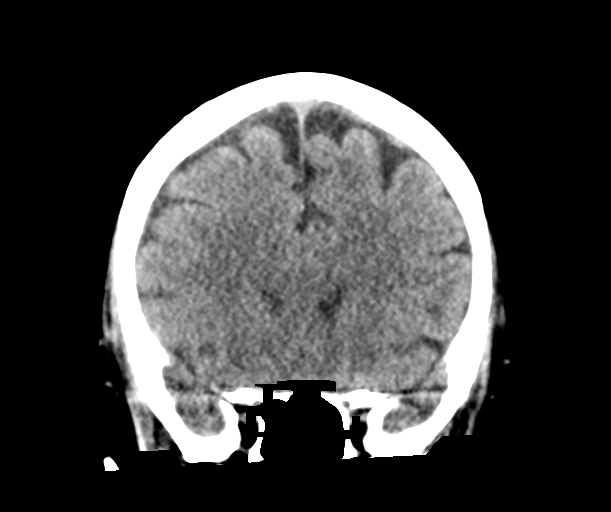
[im 28/63  brain]
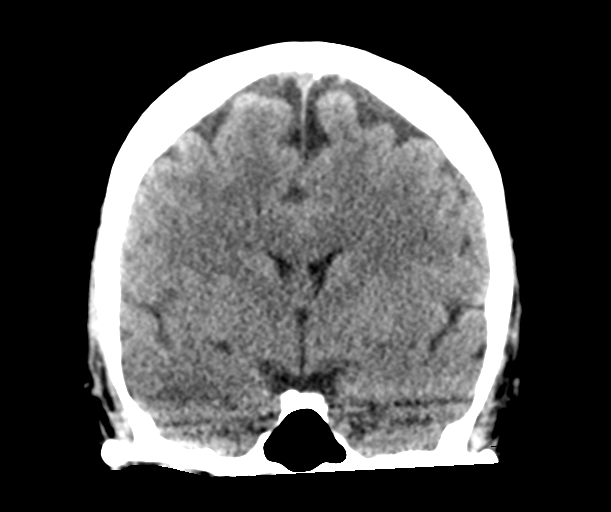
[im 35/63  brain]
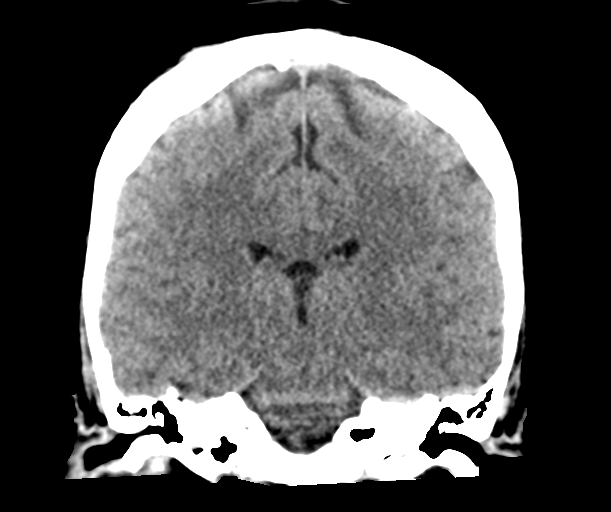

[Series 5: sagittal soft tissue · sagittal · 0.30mm/px · 3 of 49 slices shown]
[im 17/49  brain]
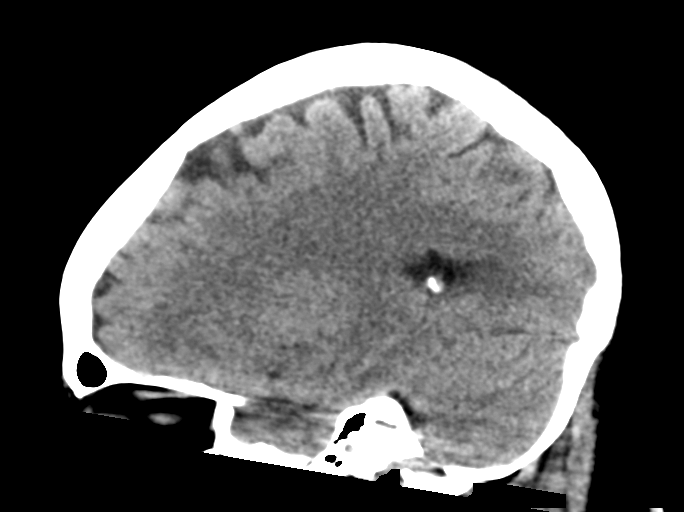
[im 25/49  brain]
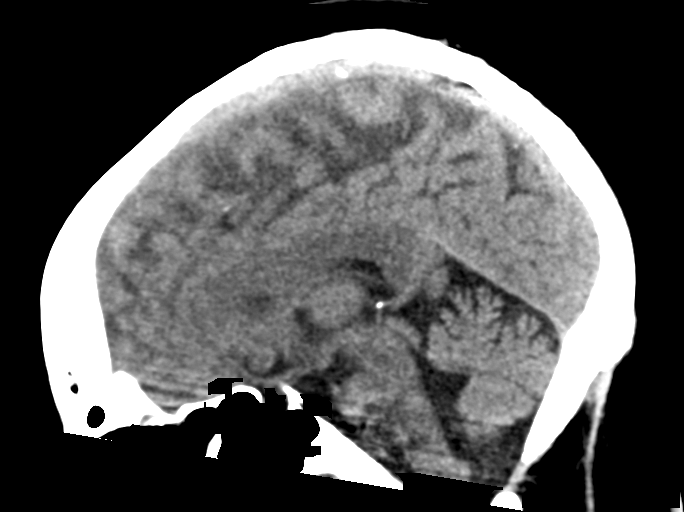
[im 33/49  brain]
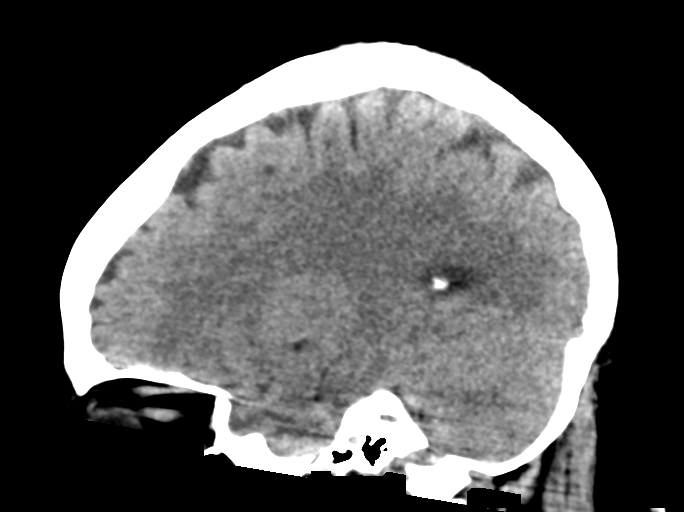

[15 of 47 positions shown; findings below may reference images not displayed]

FINDINGS: Unremarkable appearance of the calvarium without acute fracture or
aggressive lesion.

Unremarkable appearance of the scalp soft tissues.

Unremarkable appearance of the bilateral orbits.

Mastoid air cells are clear.

No significant paranasal sinus disease

No acute intracranial hemorrhage, midline shift, or mass effect.

Gray-white differentiation is maintained, without CT evidence of
acute ischemia.

Unremarkable configuration of the ventricles.
IMPRESSION: No CT evidence of acute intracranial abnormality.

## 2019-02-16 ENCOUNTER — Encounter: Payer: Self-pay | Admitting: Emergency Medicine

## 2019-02-16 ENCOUNTER — Other Ambulatory Visit: Payer: Self-pay

## 2019-02-16 ENCOUNTER — Emergency Department: Payer: Self-pay

## 2019-02-16 ENCOUNTER — Emergency Department
Admission: EM | Admit: 2019-02-16 | Discharge: 2019-02-16 | Disposition: A | Discharge status: Home / Self Care | Payer: Self-pay | Attending: Emergency Medicine | Admitting: Emergency Medicine

## 2019-02-16 DIAGNOSIS — S52044A Nondisplaced fracture of coronoid process of right ulna, initial encounter for closed fracture: Secondary | ICD-10-CM | POA: Diagnosis not present

## 2019-02-16 DIAGNOSIS — Y929 Unspecified place or not applicable: Secondary | ICD-10-CM | POA: Insufficient documentation

## 2019-02-16 DIAGNOSIS — E109 Type 1 diabetes mellitus without complications: Secondary | ICD-10-CM | POA: Insufficient documentation

## 2019-02-16 DIAGNOSIS — I10 Essential (primary) hypertension: Secondary | ICD-10-CM | POA: Diagnosis not present

## 2019-02-16 DIAGNOSIS — Y999 Unspecified external cause status: Secondary | ICD-10-CM | POA: Diagnosis not present

## 2019-02-16 DIAGNOSIS — Z79899 Other long term (current) drug therapy: Secondary | ICD-10-CM | POA: Diagnosis not present

## 2019-02-16 DIAGNOSIS — Y939 Activity, unspecified: Secondary | ICD-10-CM | POA: Insufficient documentation

## 2019-02-16 DIAGNOSIS — S59901A Unspecified injury of right elbow, initial encounter: Secondary | ICD-10-CM | POA: Diagnosis present

## 2019-02-16 DIAGNOSIS — Z794 Long term (current) use of insulin: Secondary | ICD-10-CM | POA: Diagnosis not present

## 2019-02-16 DIAGNOSIS — F17200 Nicotine dependence, unspecified, uncomplicated: Secondary | ICD-10-CM | POA: Diagnosis not present

## 2019-02-16 LAB — GLUCOSE, CAPILLARY: Glucose-Capillary: 302 mg/dL — ABNORMAL HIGH (ref 70–99)

## 2019-02-16 MED ORDER — HYDROCODONE-ACETAMINOPHEN 5-325 MG PO TABS: 1.0000 | ORAL_TABLET | ORAL | 0 refills | Status: DC | PRN

## 2019-02-16 MED ORDER — ONDANSETRON 4 MG PO TBDP: 4.0000 mg | ORAL_TABLET | Freq: Three times a day (TID) | ORAL | 0 refills | Status: DC | PRN

## 2019-02-16 MED ORDER — ONDANSETRON 8 MG PO TBDP
8.0000 mg | ORAL_TABLET | Freq: Once | ORAL | Status: AC
  Administered 2019-02-16: 17:00:00 8 mg via ORAL
  Filled 2019-02-16: qty 1

## 2019-02-16 MED ORDER — HYDROCODONE-ACETAMINOPHEN 5-325 MG PO TABS
1.0000 | ORAL_TABLET | Freq: Once | ORAL | Status: AC
  Administered 2019-02-16: 17:00:00 1 via ORAL
  Filled 2019-02-16: qty 1

## 2019-02-16 NOTE — ED Notes (Signed)
Patient transported to CT 
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
## 2020-05-28 ENCOUNTER — Other Ambulatory Visit: Payer: Self-pay

## 2020-05-28 ENCOUNTER — Emergency Department
Admission: EM | Admit: 2020-05-28 | Discharge: 2020-05-28 | Disposition: A | Discharge status: Home / Self Care | Payer: Self-pay | Attending: Emergency Medicine | Admitting: Emergency Medicine

## 2020-05-28 ENCOUNTER — Emergency Department: Payer: Self-pay

## 2020-05-28 DIAGNOSIS — F17200 Nicotine dependence, unspecified, uncomplicated: Secondary | ICD-10-CM | POA: Insufficient documentation

## 2020-05-28 DIAGNOSIS — Z794 Long term (current) use of insulin: Secondary | ICD-10-CM | POA: Insufficient documentation

## 2020-05-28 DIAGNOSIS — Z7984 Long term (current) use of oral hypoglycemic drugs: Secondary | ICD-10-CM | POA: Insufficient documentation

## 2020-05-28 DIAGNOSIS — E1100 Type 2 diabetes mellitus with hyperosmolarity without nonketotic hyperglycemic-hyperosmolar coma (NKHHC): Secondary | ICD-10-CM | POA: Insufficient documentation

## 2020-05-28 DIAGNOSIS — M25561 Pain in right knee: Secondary | ICD-10-CM | POA: Insufficient documentation

## 2020-05-28 DIAGNOSIS — I10 Essential (primary) hypertension: Secondary | ICD-10-CM | POA: Insufficient documentation

## 2020-05-28 DIAGNOSIS — M79604 Pain in right leg: Secondary | ICD-10-CM | POA: Insufficient documentation

## 2020-05-28 DIAGNOSIS — M25551 Pain in right hip: Secondary | ICD-10-CM | POA: Insufficient documentation

## 2020-05-28 DIAGNOSIS — Z79899 Other long term (current) drug therapy: Secondary | ICD-10-CM | POA: Insufficient documentation

## 2020-05-28 MED ORDER — NAPROXEN 500 MG PO TABS
500.0000 mg | ORAL_TABLET | Freq: Once | ORAL | Status: AC
  Administered 2020-05-28: 500 mg via ORAL
  Filled 2020-05-28: qty 1

## 2020-05-28 MED ORDER — LIDOCAINE 5 % EX PTCH
1.0000 | MEDICATED_PATCH | CUTANEOUS | Status: DC
  Administered 2020-05-28: 1 via TRANSDERMAL
  Filled 2020-05-28: qty 1

## 2020-05-28 MED ORDER — CYCLOBENZAPRINE HCL 5 MG PO TABS: 5.0000 mg | ORAL_TABLET | Freq: Three times a day (TID) | ORAL | 0 refills | Status: AC | PRN

## 2020-05-28 MED ORDER — CYCLOBENZAPRINE HCL 5 MG PO TABS: 5.0000 mg | ORAL_TABLET | Freq: Three times a day (TID) | ORAL | 0 refills | Status: DC | PRN

## 2020-05-28 MED ORDER — CYCLOBENZAPRINE HCL 10 MG PO TABS
5.0000 mg | ORAL_TABLET | Freq: Once | ORAL | Status: AC
  Administered 2020-05-28: 5 mg via ORAL
  Filled 2020-05-28: qty 1

## 2020-05-28 NOTE — Discharge Instructions (Signed)
Your blood pressure was also quite elevated today.  This could be from your back pain only recommend recheck by primary care doctor next couple days.

## 2020-05-28 NOTE — ED Notes (Signed)
Provided DC instructions. Verbalized understanding.  

## 2020-05-28 NOTE — ED Provider Notes (Signed)
[facility] Emergency Department Provider Note  ____________________________________________   Event Date/Time   First MD Initiated Contact with Patient 05/28/20 1145     (approximate)  I have reviewed the triage vital signs and the nursing notes.   HISTORY  Chief Complaint Leg Pain   HPI Jennifer [name] is a 40 y.o. female assessment history of hypertension and DM who presents for assessment of some right hip pain and right leg pain.  Patient stated started a week ago when she had her right knee initially had pain around her right knee but since then has been radiating from her right hip down past her knee.  It is worse when she tries to flex or extend or bend over.  She has not had any incontinence or new numbness or any pain in her left back left lower extremity or anywhere else including headache, chest pain, cough, shortness of breath, upper back pain, rash or other acute sick symptoms.  She has been taken over-the-counter pain meds with no significant help.  No other clear alleviating or rating factors.  No other acute concerns at this time.         Past Medical History:  Diagnosis Date  . Diabetes mellitus without complication ([place])   . Hypertension     Patient Active Problem List   Diagnosis Date Noted  . Type 2 diabetes mellitus with hyperosmolar nonketotic hyperglycemia ([place]) 10/25/2015  . Constipation 10/25/2015  . OSA (obstructive sleep apnea) 10/25/2015  . Headache 10/25/2015  . Obesity 10/25/2015  . Essential hypertension 10/25/2015  . Hyperglycemia without ketosis 10/21/2015  . Diabetic ketoacidosis associated with type 1 diabetes mellitus ([place]) 10/06/2015  . Hyperglycemia 11/13/2014    Past Surgical History:  Procedure Laterality Date  . CESAREAN SECTION     x2  . none      Prior to Admission medications   Medication Sig Start Date End Date Taking? Authorizing Provider  cyclobenzaprine (FLEXERIL) 5 MG tablet Take 1 tablet  (5 mg total) by mouth 3 (three) times daily as needed for up to 5 days for muscle spasms. 05/28/20 06/02/20 Yes Lucrezia Starch, MD  docusate sodium (COLACE) 100 MG capsule Take 1 capsule (100 mg total) by mouth 2 (two) times daily as needed for mild constipation. Patient not taking: Reported on 09/23/2016 10/25/15   Theodoro Grist, MD  HYDROcodone-acetaminophen (NORCO/VICODIN) 5-325 MG tablet Take 1 tablet by mouth every 4 (four) hours as needed for moderate pain. 02/16/19   Cuthriell, Charline Bills, PA-C  ibuprofen (ADVIL,MOTRIN) 600 MG tablet Take 1 tablet (600 mg total) by mouth every 6 (six) hours as needed for moderate pain. Patient not taking: Reported on 09/23/2016 05/11/16   Duanne Guess, PA-C  insulin aspart (NOVOLOG) 100 UNIT/ML injection Inject 20 Units into the skin 3 (three) times daily with meals. Patient taking differently: Inject 26 Units into the skin 3 (three) times daily with meals.  10/25/15   Theodoro Grist, MD  insulin detemir (LEVEMIR) 100 UNIT/ML injection Inject 34 Units into the skin at bedtime.    [provider]  lisinopril (PRINIVIL,ZESTRIL) 20 MG tablet Take 20 mg by mouth every morning.    [provider]  metFORMIN (GLUCOPHAGE) 1000 MG tablet Take 1 tablet (1,000 mg total) by mouth 2 (two) times daily with a meal. 10/25/15   Theodoro Grist, MD  ondansetron (ZOFRAN-ODT) 4 MG disintegrating tablet Take 1 tablet (4 mg total) by mouth every 8 (eight) hours as needed for nausea  or vomiting. 02/16/19   Cuthriell, Charline Bills, PA-C    Allergies Demerol [meperidine], Oxycodone, and Toradol [ketorolac tromethamine]  Family History  Problem Relation Age of Onset  . Diabetes Other   . Hypertension Other     Social History Social History   Tobacco Use  . Smoking status: Current Every Day Smoker    Packs/day: 0.00  . Smokeless tobacco: Never Used  Substance Use Topics  . Alcohol use: No  . Drug use: No    Review of Systems  Review of Systems   Constitutional: Negative for chills and fever.  HENT: Negative for sore throat.   Eyes: Negative for pain.  Respiratory: Negative for cough and stridor.   Cardiovascular: Negative for chest pain.  Gastrointestinal: Negative for vomiting.  Genitourinary: Negative for dysuria.  Musculoskeletal: Positive for joint pain ( R hip, R knee) and myalgias ( R hip, R knee).  Skin: Negative for rash.  Neurological: Negative for seizures, loss of consciousness and headaches.  Psychiatric/Behavioral: Negative for suicidal ideas.  All other systems reviewed and are negative.     ____________________________________________   PHYSICAL EXAM:  VITAL SIGNS: ED Triage Vitals  Enc Vitals Group     BP 05/28/20 1132 (!) 184/106     Pulse Rate 05/28/20 1132 (!) 104     Resp 05/28/20 1132 17     Temp 05/28/20 1132 98.4 F (36.9 C)     Temp Source 05/28/20 1132 Oral     SpO2 05/28/20 1132 100 %     Weight --      Height --      Head Circumference --      Peak Flow --      Pain Score 05/28/20 1133 7     Pain Loc --      Pain Edu? --      Excl. in [place]? --    Vitals:   05/28/20 1132  BP: (!) 184/106  Pulse: (!) 104  Resp: 17  Temp: 98.4 F (36.9 C)  SpO2: 100%   Physical Exam Vitals and nursing note reviewed.  Constitutional:      General: She is not in acute distress.    Appearance: She is well-developed.  HENT:     Head: Normocephalic and atraumatic.     Right Ear: External ear normal.     Left Ear: External ear normal.     Nose: Nose normal.  Eyes:     Conjunctiva/sclera: Conjunctivae normal.  Cardiovascular:     Rate and Rhythm: Normal rate and regular rhythm.     Heart sounds: No murmur heard.   Pulmonary:     Effort: Pulmonary effort is normal. No respiratory distress.     Breath sounds: Normal breath sounds.  Abdominal:     Palpations: Abdomen is soft.     Tenderness: There is no abdominal tenderness.  Musculoskeletal:     Cervical back: Neck supple.  Skin:     General: Skin is warm and dry.     Capillary Refill: Capillary refill takes less than 2 seconds.  Neurological:     Mental Status: She is alert and oriented to person, place, and time.  Psychiatric:        Mood and Affect: Mood normal.     Some right SI tenderness and muscle spasming on the right paralumbar muscles.  There is a positive right-sided straight leg test.  Patient has pain on passive range of motion at the right hip and right knee and  she is a little weakness on flexion extension at but these joints I think pain related has full strength on plantar dorsiflexion of the right ankle.  2+ bilateral DP pulses.  Sensation intact light touch throughout both lower extremities.  Back is otherwise unremarkable.  Abdomen is soft nontender throughout. ____________________________________________   LABS (all labs ordered are listed, but only abnormal results are displayed)  Labs Reviewed - No data to display ____________________________________________  EKG  ____________________________________________  RADIOLOGY  ED MD interpretation: Some minimal osteophytes but no large disc disease or other acute abnormality.  Official radiology report(s): DG Lumbar Spine Complete  Result Date: 05/28/2020 CLINICAL DATA:  Right hip and leg pain, no injury. EXAM: LUMBAR SPINE - COMPLETE 4+ VIEW COMPARISON:  None. FINDINGS: Alignment is anatomic. Vertebral body and disc space height are maintained. Minimal anterior marginal osteophytosis in the upper and mid lumbar spine. No pars defects. IMPRESSION: 1. No acute findings. 2. Minimal anterior marginal osteophytosis in the upper and mid lumbar spine. Electronically Signed   By: Lorin Picket M.D.   On: 05/28/2020 13:01    ____________________________________________   PROCEDURES  Procedure(s) performed (including Critical Care):  Procedures   ____________________________________________   INITIAL IMPRESSION / ASSESSMENT AND PLAN / ED  COURSE      Patient presents with above history and exam for some right hip and right knee pain.On arrival she is hypertensive with a BP of 184/106 and slight tachycardic at 104 with stable vital signs on room air.  She does have some pain on passive range of motion at the right hip and right knee but otherwise neurovascular intact throughout the right lower extremity and has full strength at the right ankle.  Suspect likely muscle spasm and component of sciatic pain.  There is no edema or other physical exam findings to suggest DVT at this time.  No findings to suggest acute infectious process.  There is no point tenderness effusion or deformity at the right hip or knee to suggest underlying orthopedic injury.  Patient has been able to bear weight albeit with some pain.  Low suspicion for gouty arthritis.  Plain film of the L-spine shows osteophytes but no significant disc disease fracture or dislocation.  Patient given below noted analgesia.  Advised her and that her blood pressure was elevated and she should have this rechecked in a couple days.  I suspect this is elevated in the setting of known hypertension secondary to some pain.  Given otherwise stable vitals with reassuring exam and work-up low suspicion for acute cord compression or other Mi life-threatening pathology I think she is safe for discharge with close outpatient PCP follow-up.  Discharged stable condition.  Strict return precautions advised and discussed.  Rx written for Flexeril.   ____________________________________________   FINAL CLINICAL IMPRESSION(S) / ED DIAGNOSES  Final diagnoses:  Pain of right lower extremity  Hypertension, unspecified type    Medications  lidocaine (LIDODERM) 5 % 1 patch (1 patch Transdermal Patch Applied 05/28/20 1216)  cyclobenzaprine (FLEXERIL) tablet 5 mg (5 mg Oral Given 05/28/20 1216)  naproxen (NAPROSYN) tablet 500 mg (500 mg Oral Given 05/28/20 1217)     ED Discharge Orders          Ordered    cyclobenzaprine (FLEXERIL) 5 MG tablet  3 times daily PRN        05/28/20 1310           Note:  This document was prepared using Dragon voice recognition software and may include  unintentional dictation errors.   Lucrezia Starch, MD 05/28/20 1315

## 2020-05-28 NOTE — ED Triage Notes (Signed)
Pt comes with c/o right hip and leg pain. Pt states this started week ago.

## 2020-06-19 ENCOUNTER — Emergency Department
Admission: EM | Admit: 2020-06-19 | Discharge: 2020-06-19 | Disposition: A | Discharge status: Home / Self Care | Payer: Self-pay | Attending: Family Medicine | Admitting: Family Medicine

## 2020-06-19 ENCOUNTER — Other Ambulatory Visit: Payer: Self-pay

## 2020-06-19 ENCOUNTER — Encounter: Payer: Self-pay | Admitting: Emergency Medicine

## 2020-06-19 DIAGNOSIS — M5441 Lumbago with sciatica, right side: Secondary | ICD-10-CM | POA: Insufficient documentation

## 2020-06-19 DIAGNOSIS — Z7984 Long term (current) use of oral hypoglycemic drugs: Secondary | ICD-10-CM | POA: Insufficient documentation

## 2020-06-19 DIAGNOSIS — I10 Essential (primary) hypertension: Secondary | ICD-10-CM | POA: Insufficient documentation

## 2020-06-19 DIAGNOSIS — Z794 Long term (current) use of insulin: Secondary | ICD-10-CM | POA: Insufficient documentation

## 2020-06-19 DIAGNOSIS — F17200 Nicotine dependence, unspecified, uncomplicated: Secondary | ICD-10-CM | POA: Insufficient documentation

## 2020-06-19 DIAGNOSIS — E119 Type 2 diabetes mellitus without complications: Secondary | ICD-10-CM | POA: Insufficient documentation

## 2020-06-19 MED ORDER — HYDROCODONE-ACETAMINOPHEN 5-325 MG PO TABS
1.0000 | ORAL_TABLET | Freq: Once | ORAL | Status: AC
  Administered 2020-06-19: 1 via ORAL
  Filled 2020-06-19: qty 1

## 2020-06-19 MED ORDER — LIDOCAINE 5 % EX PTCH
1.0000 | MEDICATED_PATCH | CUTANEOUS | Status: DC
  Administered 2020-06-19: 1 via TRANSDERMAL
  Filled 2020-06-19: qty 1

## 2020-06-19 MED ORDER — HYDROCODONE-ACETAMINOPHEN 5-325 MG PO TABS: 1.0000 | ORAL_TABLET | Freq: Four times a day (QID) | ORAL | 0 refills | Status: AC | PRN

## 2020-06-19 MED ORDER — METHOCARBAMOL 500 MG PO TABS: 500.0000 mg | ORAL_TABLET | Freq: Four times a day (QID) | ORAL | 0 refills | Status: DC

## 2020-06-19 NOTE — ED Notes (Signed)
Pt verbalized understanding of d/c instructions at this time. Prescriptions and follow-up care reviewed at this time. Pt assisted to POV via wheelchair at this time, tolerated well, NAD noted.

## 2020-06-19 NOTE — ED Provider Notes (Signed)
[facility] Emergency Department Provider Note ____________________________________________  Time seen: Approximately 8:34 PM  I have reviewed the triage vital signs and the nursing notes.   HISTORY  Chief Complaint Knee Injury    HPI Jennifer [name] is a 40 y.o. female who presents to the emergency department for evaluation and treatment of right hip pain that radiates down toward the right knee.  No known injury.  She was evaluated for the same last month and prescribed muscle relaxers.  No relief with medication.  She is also taken ibuprofen and Aleve without relief.  No previous similar symptoms.   Past Medical History:  Diagnosis Date  . Diabetes mellitus without complication ([address])   . Hypertension     Patient Active Problem List   Diagnosis Date Noted  . Type 2 diabetes mellitus with hyperosmolar nonketotic hyperglycemia ([place]) 10/25/2015  . Constipation 10/25/2015  . OSA (obstructive sleep apnea) 10/25/2015  . Headache 10/25/2015  . Obesity 10/25/2015  . Essential hypertension 10/25/2015  . Hyperglycemia without ketosis 10/21/2015  . Diabetic ketoacidosis associated with type 1 diabetes mellitus ([place]) 10/06/2015  . Hyperglycemia 11/13/2014    Past Surgical History:  Procedure Laterality Date  . CESAREAN SECTION     x2  . none      Prior to Admission medications   Medication Sig Start Date End Date Taking? Authorizing Provider  HYDROcodone-acetaminophen (NORCO/VICODIN) 5-325 MG tablet Take 1 tablet by mouth every 6 (six) hours as needed for up to 3 days for severe pain. 06/19/20 06/22/20 Yes Ephrem Carrick B, FNP  methocarbamol (ROBAXIN) 500 MG tablet Take 1 tablet (500 mg total) by mouth 4 (four) times daily. 06/19/20  Yes Karita Dralle B, FNP  docusate sodium (COLACE) 100 MG capsule Take 1 capsule (100 mg total) by mouth 2 (two) times daily as needed for mild constipation. Patient not taking: Reported on 09/23/2016 10/25/15   Theodoro Grist,  MD  ibuprofen (ADVIL,MOTRIN) 600 MG tablet Take 1 tablet (600 mg total) by mouth every 6 (six) hours as needed for moderate pain. Patient not taking: Reported on 09/23/2016 05/11/16   Duanne Guess, PA-C  insulin aspart (NOVOLOG) 100 UNIT/ML injection Inject 20 Units into the skin 3 (three) times daily with meals. Patient taking differently: Inject 26 Units into the skin 3 (three) times daily with meals.  10/25/15   Theodoro Grist, MD  insulin detemir (LEVEMIR) 100 UNIT/ML injection Inject 34 Units into the skin at bedtime.    [provider]  lisinopril (PRINIVIL,ZESTRIL) 20 MG tablet Take 20 mg by mouth every morning.    [provider]  metFORMIN (GLUCOPHAGE) 1000 MG tablet Take 1 tablet (1,000 mg total) by mouth 2 (two) times daily with a meal. 10/25/15   Theodoro Grist, MD  ondansetron (ZOFRAN-ODT) 4 MG disintegrating tablet Take 1 tablet (4 mg total) by mouth every 8 (eight) hours as needed for nausea or vomiting. 02/16/19   Cuthriell, Charline Bills, PA-C    Allergies Demerol [meperidine], Oxycodone, and Toradol [ketorolac tromethamine]  Family History  Problem Relation Age of Onset  . Diabetes Other   . Hypertension Other     Social History Social History   Tobacco Use  . Smoking status: Current Every Day Smoker    Packs/day: 0.00  . Smokeless tobacco: Never Used  Substance Use Topics  . Alcohol use: No  . Drug use: No    Review of Systems Constitutional: Negative for fever. Cardiovascular: Negative for chest pain. Respiratory: Negative for shortness  of breath. Musculoskeletal: Positive for right hip and leg pain. Skin: Negative for open wounds or lesions. Neurological: Negative for decrease in sensation  ____________________________________________   PHYSICAL EXAM:  VITAL SIGNS: ED Triage Vitals  Enc Vitals Group     BP 06/19/20 1834 (!) 185/116     Pulse Rate 06/19/20 1834 (!) 118     Resp 06/19/20 1834 18     Temp 06/19/20 1834 98.7 F (37.1  C)     Temp Source 06/19/20 1834 Oral     SpO2 06/19/20 1834 99 %     Weight --      Height --      Head Circumference --      Peak Flow --      Pain Score 06/19/20 1831 9     Pain Loc --      Pain Edu? --      Excl. in [place]? --     Constitutional: Alert and oriented. Well appearing and in no acute distress. Eyes: Conjunctivae are clear without discharge or drainage Head: Atraumatic Neck: Supple. Respiratory: No cough. Respirations are even and unlabored. Musculoskeletal: No focal tenderness over the right hip or thigh.  Pain radiates from buttock laterally toward the knee.  Increases with straight leg raise. Neurologic: Motor and sensory function is intact Skin: No open wounds or lesions Psychiatric: Affect and behavior are appropriate.  ____________________________________________   LABS (all labs ordered are listed, but only abnormal results are displayed)  Labs Reviewed - No data to display ____________________________________________  RADIOLOGY  Not indicated  I, Sherrie George, personally viewed and evaluated these images (plain radiographs) as part of my medical decision making, as well as reviewing the written report by the radiologist.  No results found. ____________________________________________   PROCEDURES  Procedures  ____________________________________________   INITIAL IMPRESSION / ASSESSMENT AND PLAN / ED COURSE  Jennifer [name] is a 40 y.o. who presents to the emergency department for treatment and evaluation of right hip and leg pain.  See HPI for further details.  Exam and patient's symptoms most likely secondary to sciatica.  She is an uncontrolled diabetic, therefore no steroid will be prescribed today.  She will be treated with Robaxin and Norco.  She was encouraged to follow-up with primary care or neurology.  She was advised to return to the emergency department for symptoms change or worsen if she is unable to schedule  appointment.   Medications  lidocaine (LIDODERM) 5 % 1 patch (1 patch Transdermal Patch Applied 06/19/20 2104)  HYDROcodone-acetaminophen (NORCO/VICODIN) 5-325 MG per tablet 1 tablet (1 tablet Oral Given 06/19/20 2104)    Pertinent labs & imaging results that were available during my care of the patient were reviewed by me and considered in my medical decision making (see chart for details).   _________________________________________   FINAL CLINICAL IMPRESSION(S) / ED DIAGNOSES  Final diagnoses:  Acute back pain with sciatica, right    ED Discharge Orders         Ordered    methocarbamol (ROBAXIN) 500 MG tablet  4 times daily        06/19/20 2052    HYDROcodone-acetaminophen (NORCO/VICODIN) 5-325 MG tablet  Every 6 hours PRN        06/19/20 2052           If controlled substance prescribed during this visit, 12 month history viewed on the [place] prior to issuing an initial prescription for Schedule II or III opiod.   Harshitha Fretz B,  FNP 06/20/20 0001    Carrie Mew, MD 06/20/20 [phone]

## 2020-06-19 NOTE — ED Triage Notes (Signed)
Pt comes with c/o right knee and hip pain. Pt states no recently injuries and states it is not getting better.

## 2021-04-17 ENCOUNTER — Other Ambulatory Visit: Payer: Self-pay

## 2021-04-17 ENCOUNTER — Emergency Department: Payer: Self-pay

## 2021-04-17 ENCOUNTER — Observation Stay: Payer: Self-pay

## 2021-04-17 ENCOUNTER — Observation Stay
Admission: EM | Admit: 2021-04-17 | Discharge: 2021-04-18 | Disposition: A | Discharge status: Home / Self Care | Payer: Self-pay | Attending: Internal Medicine | Admitting: Internal Medicine

## 2021-04-17 DIAGNOSIS — E1100 Type 2 diabetes mellitus with hyperosmolarity without nonketotic hyperglycemic-hyperosmolar coma (NKHHC): Secondary | ICD-10-CM | POA: Insufficient documentation

## 2021-04-17 DIAGNOSIS — Z7984 Long term (current) use of oral hypoglycemic drugs: Secondary | ICD-10-CM | POA: Insufficient documentation

## 2021-04-17 DIAGNOSIS — G4733 Obstructive sleep apnea (adult) (pediatric): Secondary | ICD-10-CM | POA: Diagnosis present

## 2021-04-17 DIAGNOSIS — I10 Essential (primary) hypertension: Secondary | ICD-10-CM | POA: Insufficient documentation

## 2021-04-17 DIAGNOSIS — D649 Anemia, unspecified: Secondary | ICD-10-CM

## 2021-04-17 DIAGNOSIS — N920 Excessive and frequent menstruation with regular cycle: Secondary | ICD-10-CM

## 2021-04-17 DIAGNOSIS — D62 Acute posthemorrhagic anemia: Principal | ICD-10-CM | POA: Insufficient documentation

## 2021-04-17 DIAGNOSIS — N921 Excessive and frequent menstruation with irregular cycle: Secondary | ICD-10-CM | POA: Insufficient documentation

## 2021-04-17 DIAGNOSIS — E669 Obesity, unspecified: Secondary | ICD-10-CM | POA: Insufficient documentation

## 2021-04-17 DIAGNOSIS — Z20822 Contact with and (suspected) exposure to covid-19: Secondary | ICD-10-CM | POA: Insufficient documentation

## 2021-04-17 DIAGNOSIS — Z794 Long term (current) use of insulin: Secondary | ICD-10-CM | POA: Insufficient documentation

## 2021-04-17 DIAGNOSIS — Z79899 Other long term (current) drug therapy: Secondary | ICD-10-CM | POA: Insufficient documentation

## 2021-04-17 DIAGNOSIS — F17210 Nicotine dependence, cigarettes, uncomplicated: Secondary | ICD-10-CM | POA: Insufficient documentation

## 2021-04-17 DIAGNOSIS — N939 Abnormal uterine and vaginal bleeding, unspecified: Secondary | ICD-10-CM

## 2021-04-17 LAB — CBC
HCT: 24.9 % — ABNORMAL LOW (ref 36.0–46.0)
Hemoglobin: 6.2 g/dL — ABNORMAL LOW (ref 12.0–15.0)
MCH: 15.3 pg — ABNORMAL LOW (ref 26.0–34.0)
MCHC: 24.9 g/dL — ABNORMAL LOW (ref 30.0–36.0)
MCV: 61.5 fL — ABNORMAL LOW (ref 80.0–100.0)
Platelets: 381 10*3/uL (ref 150–400)
RBC: 4.05 MIL/uL (ref 3.87–5.11)
RDW: 21.5 % — ABNORMAL HIGH (ref 11.5–15.5)
WBC: 8.1 10*3/uL (ref 4.0–10.5)
nRBC: 0 % (ref 0.0–0.2)

## 2021-04-17 LAB — BASIC METABOLIC PANEL
Anion gap: 9 (ref 5–15)
BUN: 15 mg/dL (ref 6–20)
CO2: 21 mmol/L — ABNORMAL LOW (ref 22–32)
Calcium: 9 mg/dL (ref 8.9–10.3)
Chloride: 106 mmol/L (ref 98–111)
Creatinine, Ser: 1.01 mg/dL — ABNORMAL HIGH (ref 0.44–1.00)
GFR, Estimated: >60 mL/min (ref >60)
Glucose, Bld: 231 mg/dL — ABNORMAL HIGH (ref 70–99)
Potassium: 3.9 mmol/L (ref 3.5–5.1)
Sodium: 136 mmol/L (ref 135–145)

## 2021-04-17 LAB — TROPONIN I (HIGH SENSITIVITY)
Troponin I (High Sensitivity): 7 ng/L (ref <18)
Troponin I (High Sensitivity): 8 ng/L (ref <18)

## 2021-04-17 LAB — RESP PANEL BY RT-PCR (FLU A&B, COVID) ARPGX2
Influenza A by PCR: NEGATIVE
Influenza B by PCR: NEGATIVE
SARS Coronavirus 2 by RT PCR: NEGATIVE

## 2021-04-17 LAB — PREPARE RBC (CROSSMATCH)

## 2021-04-17 LAB — ABO/RH: ABO/RH(D): B POS

## 2021-04-17 LAB — HEMOGLOBIN AND HEMATOCRIT, BLOOD
HCT: 30.9 % — ABNORMAL LOW (ref 36.0–46.0)
Hemoglobin: 8.7 g/dL — ABNORMAL LOW (ref 12.0–15.0)

## 2021-04-17 LAB — GLUCOSE, CAPILLARY: Glucose-Capillary: 192 mg/dL — ABNORMAL HIGH (ref 70–99)

## 2021-04-17 MED ORDER — SENNA 8.6 MG PO TABS
1.0000 | ORAL_TABLET | Freq: Two times a day (BID) | ORAL | Status: DC
  Administered 2021-04-17 – 2021-04-18 (×2): 8.6 mg via ORAL
  Filled 2021-04-17 (×2): qty 1

## 2021-04-17 MED ORDER — FAMOTIDINE 20 MG PO TABS
20.0000 mg | ORAL_TABLET | Freq: Two times a day (BID) | ORAL | Status: DC
  Administered 2021-04-17 – 2021-04-18 (×2): 20 mg via ORAL
  Filled 2021-04-17 (×2): qty 1

## 2021-04-17 MED ORDER — SODIUM CHLORIDE 0.9% IV SOLUTION
Freq: Once | INTRAVENOUS | Status: DC
  Filled 2021-04-17: qty 250

## 2021-04-17 MED ORDER — INSULIN ASPART 100 UNIT/ML IJ SOLN
0.0000 [IU] | Freq: Three times a day (TID) | INTRAMUSCULAR | Status: DC
  Administered 2021-04-17: 7 [IU] via SUBCUTANEOUS
  Administered 2021-04-18 (×2): 3 [IU] via SUBCUTANEOUS
  Filled 2021-04-17 (×3): qty 1

## 2021-04-17 MED ORDER — INSULIN DETEMIR 100 UNIT/ML ~~LOC~~ SOLN
50.0000 [IU] | Freq: Two times a day (BID) | SUBCUTANEOUS | Status: DC
  Administered 2021-04-17 – 2021-04-18 (×2): 50 [IU] via SUBCUTANEOUS
  Filled 2021-04-17 (×4): qty 0.5

## 2021-04-17 MED ORDER — LABETALOL HCL 5 MG/ML IV SOLN
10.0000 mg | Freq: Once | INTRAVENOUS | Status: AC
  Administered 2021-04-17: 10 mg via INTRAVENOUS
  Filled 2021-04-17: qty 4

## 2021-04-17 MED ORDER — SODIUM CHLORIDE 0.9 % IV SOLN: INTRAVENOUS | Status: DC

## 2021-04-17 MED ORDER — DOCUSATE SODIUM 100 MG PO CAPS
100.0000 mg | ORAL_CAPSULE | Freq: Two times a day (BID) | ORAL | Status: DC
  Administered 2021-04-17 – 2021-04-18 (×2): 100 mg via ORAL
  Filled 2021-04-17 (×2): qty 1

## 2021-04-17 MED ORDER — ACETAMINOPHEN 325 MG PO TABS
650.0000 mg | ORAL_TABLET | Freq: Four times a day (QID) | ORAL | Status: DC | PRN
  Administered 2021-04-18: 650 mg via ORAL
  Filled 2021-04-17: qty 2

## 2021-04-17 MED ORDER — SODIUM CHLORIDE 0.9 % IV SOLN
10.0000 mL/h | Freq: Once | INTRAVENOUS | Status: AC
  Administered 2021-04-17: 10 mL/h via INTRAVENOUS

## 2021-04-17 MED ORDER — LISINOPRIL 20 MG PO TABS
40.0000 mg | ORAL_TABLET | Freq: Every day | ORAL | Status: DC
  Administered 2021-04-17 – 2021-04-18 (×2): 40 mg via ORAL
  Filled 2021-04-17: qty 2
  Filled 2021-04-17: qty 4

## 2021-04-17 MED ORDER — FERROUS SULFATE 325 (65 FE) MG PO TBEC: 325.0000 mg | DELAYED_RELEASE_TABLET | Freq: Two times a day (BID) | ORAL | 3 refills | Status: DC

## 2021-04-17 MED ORDER — ACETAMINOPHEN 650 MG RE SUPP: 650.0000 mg | Freq: Four times a day (QID) | RECTAL | Status: DC | PRN

## 2021-04-17 MED ORDER — GABAPENTIN 300 MG PO CAPS
300.0000 mg | ORAL_CAPSULE | Freq: Three times a day (TID) | ORAL | Status: DC
  Administered 2021-04-17 – 2021-04-18 (×3): 300 mg via ORAL
  Filled 2021-04-17 (×3): qty 1

## 2021-04-17 MED ORDER — ONDANSETRON HCL 4 MG PO TABS: 4.0000 mg | ORAL_TABLET | Freq: Four times a day (QID) | ORAL | Status: DC | PRN

## 2021-04-17 MED ORDER — ONDANSETRON HCL 4 MG/2ML IJ SOLN
4.0000 mg | Freq: Four times a day (QID) | INTRAMUSCULAR | Status: DC | PRN
Start: 2021-04-17 — End: 2021-04-18

## 2021-04-17 MED ORDER — HYDRALAZINE HCL 20 MG/ML IJ SOLN
10.0000 mg | Freq: Three times a day (TID) | INTRAMUSCULAR | Status: DC | PRN
  Administered 2021-04-17 – 2021-04-18 (×2): 10 mg via INTRAVENOUS
  Filled 2021-04-17 (×2): qty 1

## 2021-04-17 NOTE — Assessment & Plan Note (Signed)
Diet and exercise discussed

## 2021-04-17 NOTE — ED Triage Notes (Addendum)
Pt comes with c/o low hemoglobin. Pt states she was told it was 6.6. Pt denies any history of this. Pt states SOb and CP. Pt states this all started Thanksgiving of last year. Pt states 8/10 pain in chest. ? ?Pt states headache and current issues with BP. Pt does take BP meds and has taken them today. ? ?Pt also states current hyperglycemia and some nausea. ?

## 2021-04-17 NOTE — Assessment & Plan Note (Signed)
Continue lisinopril 40 mg daily. ?Add hydralazine IV as needed. ?

## 2021-04-17 NOTE — Progress Notes (Signed)
Cross Cover ?Labetalol 10 mg ordered for blood pressure 194/109 post hydralazine ?

## 2021-04-17 NOTE — ED Notes (Signed)
First RN note:  Pt sent over by her MD due to abnormal labs with a Hgb of 6.  Pt also c/o chest pain and SHOB.   ?

## 2021-04-17 NOTE — H&P (Signed)
` ?History and Physical  ? ? ?Patient: Jennifer [name] DOB: 04/11/1980 ? ?DOA: 04/17/2021 ? ?DOS: the patient was seen and examined on 04/17/2021 ? ?[facility]P: [facility] [place]  ? ?Patient coming from: Home ? ?Chief Complaint:  ?Chief Complaint  ?Patient presents with  ? Low Hemoglobin  ? ?HPI: Jennifer [name] is a 41 y.o. female with PMH significant for HTN, DM with neuropathy, GERD, obesity, iron deficiency anemia presented to the ED with complaints of shortness of breath on exertion for 3 to 4 weeks.  Patient reports having heavy menstrual bleeding and has not seen gynecologist yet. She reports gets easily fatigued, having shortness of breath on exertion.  She went to see her primary care physician last week and was ordered blood work.  She is found to have hemoglobin 6.4, and was called to go to the ED.  Patient denies any chest pain, palpitations, dizziness but reports having shortness of breath on exertion.  She denies any recent travel, sick contacts, fever, chills, nausea, vomiting, urinary symptoms. ? ?ED course: She was hemodynamically stable except hypertension. ?HR 91, RR 19, SPO2 99% on room air, temp 98.2, blood pressure 146/112 ?Labs include sodium 136, potassium 3.9, chloride 106, bicarb 21, glucose 231, BUN 15, creatinine 1.01, calcium 9.0, anion gap 9, troponin 7> 9, WBC 8.1, hemoglobin 6.2 hematocrit 24.6, MCV 61.5, platelet 381, COVID negative, influenza negative ?Chest x-ray no acute infiltrate.  Low lung volumes. ?EKG sinus tachycardia, no ST-T wave changes. ? ?Review of Systems: Review of Systems  ?Constitutional:  Positive for malaise/fatigue.  ?HENT: Negative.    ?Eyes: Negative.   ?Respiratory:  Positive for shortness of breath.   ?     Exertional shortness of breath, easy fatigability.  ?Cardiovascular: Negative.   ?Gastrointestinal: Negative.   ?Genitourinary: Negative.   ?Musculoskeletal: Negative.   ?Skin: Negative.   ?Neurological: Negative.    ?Endo/Heme/Allergies: Negative.   ?Psychiatric/Behavioral: Negative.     ? ? ?Past Medical History:  ?Diagnosis Date  ? Diabetes mellitus without complication ([place])   ? Hypertension   ? ?Past Surgical History:  ?Procedure Laterality Date  ? CESAREAN SECTION    ? x2  ? none    ? ?Social History:  reports that she has been smoking. She has never used smokeless tobacco. She reports that she does not drink alcohol and does not use drugs. ? ?Allergies  ?Allergen Reactions  ? Demerol [Meperidine] Hives, Itching and Nausea And Vomiting  ? Meloxicam   ? Oxycodone Hives, Itching and Nausea And Vomiting  ? Toradol [Ketorolac Tromethamine] Hives, Itching and Nausea And Vomiting  ? ? ?Family History  ?Problem Relation Age of Onset  ? Diabetes Other   ? Hypertension Other   ? ? ?Prior to Admission medications   ?Medication Sig Start Date End Date Taking? Authorizing Provider  ?famotidine (PEPCID) 20 MG tablet Take 20 mg by mouth 2 (two) times daily. 04/15/21  Yes [provider]  ?ferrous sulfate 325 (65 FE) MG EC tablet Take 1 tablet (325 mg total) by mouth 2 (two) times daily. 04/17/21 04/17/22 Yes Naaman Plummer, MD  ?gabapentin (NEURONTIN) 300 MG capsule Take 300 mg by mouth 3 (three) times daily. 04/15/21  Yes [provider]  ?ibuprofen (ADVIL) 800 MG tablet Take 800 mg by mouth 3 (three) times daily. 04/15/21  Yes [provider]  ?LEVEMIR FLEXPEN 100 UNIT/ML FlexPen Inject 60 Units into the skin 3 (three) times daily. 04/15/21  Yes [provider]  ?lisinopril (ZESTRIL) 40 MG tablet Take 40 mg by mouth daily. 04/15/21  Yes [provider]  ?metFORMIN (GLUCOPHAGE) 1000 MG tablet Take 1 tablet (1,000 mg total) by mouth 2 (two) times daily with a meal. 10/25/15  Yes Theodoro Grist, MD  ?NOVOLOG FLEXPEN 100 UNIT/ML FlexPen Inject 30 Units into the skin 3 (three) times daily with meals. 04/15/21  Yes [provider]  ?amoxicillin-clavulanate (AUGMENTIN) 875-125 MG tablet Take 1  tablet by mouth 2 (two) times daily. 04/15/21   [provider]  ? ? ?Physical Exam: ?Vitals:  ? 04/17/21 1444 04/17/21 1445 04/17/21 1500 04/17/21 1647  ?BP: (!) 175/112 (!) 175/112 (!) 146/112 (!) 170/103  ?Pulse: 86 86 91 90  ?Resp: '17 13 19 16  '$ ?Temp: 98.2 ?F (36.8 ?C)   98.4 ?F (36.9 ?C)  ?TempSrc: Oral   Oral  ?SpO2: 99% 100% 99%   ? ?General exam: Appears comfortable, not in any acute distress. ?Respiratory system: CTA bilaterally, no wheezing, no crackles, normal respiratory effort. ?Cardiovascular system: S1-S2 heard, regular rate and rhythm, no murmur. ?Gastrointestinal system: Abdomen is soft, non tender, non distended, BS+ ?Central nervous system: Alert, oriented x3, no focal neurological deficits. ?Extremities: No edema, no cyanosis, no clubbing. ?Psychiatry: Mood, insight, judgment normal. ? ?Data Reviewed: ?I have Reviewed nursing notes, Vitals, and Lab results since pt's last encounter. Pertinent lab results CBC, BMP ?I have ordered test including CBC, BMP, mag, Phos ?I have independently visualized and interpreted imaging chest x-ray which showed low lung volumes, no infiltrate.Marland Kitchen ?I have independently visualized and interpreted EKG which showed EKG: normal EKG, normal sinus rhythm, unchanged from previous tracings, sinus tachycardia. ?I have reviewed the last note from ED physician,  ?I have discussed pt's care plan and test results with patient and sister.  ? ?Assessment and Plan: ?* Acute on chronic blood loss anemia ?Patient presented in the ED with shortness of breath on exertion, easy fatigability, ?Hemoglobin 6.2 on arrival.  Last known hemoglobin 11.2. ?There is no any visible signs of active bleeding.  Likely chronic in the setting of heavy periods. ?Transfuse 2 units PRBC.  Follow-up H&H. ?Consider GYN consult. ? ?Essential hypertension ?Continue lisinopril 40 mg daily. ?Add hydralazine IV as needed. ? ?Obesity ?Diet and exercise discussed. ? ?OSA (obstructive sleep apnea) ?Continue  CPAP at night. ? ?Type 2 diabetes mellitus with hyperosmolar nonketotic hyperglycemia ([place]) ?Patient reports having diabetes for long time. ?She takes Levemir 60 units 3 times daily, NovoLog 30 units with meals. ?Obtain hemoglobin A1c, last known HbA1c 12.2 in 2017. ?Diabetic coordinator consult. ?Continue Levemir 60 units twice daily, regular insulin sliding scale. ?Continue gabapentin for neuropathy. ? ? ?Advance Care Planning:   Code Status: Full Code  ? ?Consults: None ? ?Family Communication: Sister at bedside ? ?Severity of Illness: ?The appropriate patient status for this patient is OBSERVATION. Observation status is judged to be reasonable and necessary in order to provide the required intensity of service to ensure the patient's safety. The patient's presenting symptoms, physical exam findings, and initial radiographic and laboratory data in the context of their medical condition is felt to place them at decreased risk for further clinical deterioration. Furthermore, it is anticipated that the patient will be medically stable for discharge from the hospital within 2 midnights of admission.  ? ?Author: ?Shawna Clamp, MD ?04/17/2021 4:58 PM ? ?For on call review [email or web address].  ?

## 2021-04-17 NOTE — Assessment & Plan Note (Addendum)
Patient presented in the ED with shortness of breath on exertion, easy fatigability, ?Hemoglobin 6.2 on arrival.  Last known hemoglobin 11.2. ?There is no any visible signs of active bleeding.  Likely chronic in the setting of heavy periods. ?Transfuse 2 units PRBC.  Follow-up H&H. ?Consider GYN consult. ?

## 2021-04-17 NOTE — ED Provider Notes (Signed)
? ?[facility] ?Provider Note ? ? Event Date/Time  ? First MD Initiated Contact with Patient 04/17/21 1151   ?  (approximate) ?History  ?Low Hemoglobin ? ?HPI ?Jennifer [name] is a 41 y.o. female with a stated past medical history of hypertension and menometrorrhagia who presents for worsening symptoms of dyspnea on exertion, intermittent chest pain, and generalized shortness of breath that has been worsening over the last month.  Patient saw her primary care physician earlier in the week and was called today and told that she had a hemoglobin of 6.6 as well as to present to the emergency department for likely transfusion.  Patient denies any chest pain, abdominal pain, nausea/vomiting/diarrhea, dysuria.  Patient does endorse associated headache ?Physical Exam  ?Triage Vital Signs: ?ED Triage Vitals  ?Enc Vitals Group  ?   BP 04/17/21 1148 (!) 211/113  ?   Pulse Rate 04/17/21 1148 (!) 119  ?   Resp 04/17/21 1148 20  ?   Temp 04/17/21 1148 98.4 ?F (36.9 ?C)  ?   Temp Source 04/17/21 1148 Oral  ?   SpO2 04/17/21 1148 100 %  ?   Weight --   ?   Height --   ?   Head Circumference --   ?   Peak Flow --   ?   Pain Score 04/17/21 1145 8  ?   Pain Loc --   ?   Pain Edu? --   ?   Excl. in [place]? --   ? ?Most recent vital signs: ?Vitals:  ? 04/18/21 0441 04/18/21 0750  ?BP: (!) 172/94 (!) 174/90  ?Pulse: 89 89  ?Resp: 19   ?Temp: 98.6 ?F (37 ?C) 98.6 ?F (37 ?C)  ?SpO2: 99% 100%  ? ?General: Awake, oriented x4. ?CV:  Good peripheral perfusion.  ?Resp:  Normal effort.  Shortness of breath after 1-2 sentences.  Clear to auscultation bilaterally ?Abd:  No distention.  ?Other:  Middle-aged overweight African-American female laying in bed in no distress ?ED Results / Procedures / Treatments  ?Labs ?(all labs ordered are listed, but only abnormal results are displayed) ?Labs Reviewed  ?BASIC METABOLIC PANEL - Abnormal; Notable for the following components:  ?    Result Value  ? CO2 21 (*)   ? Glucose, Bld 231 (*)    ? Creatinine, Ser 1.01 (*)   ? All other components within normal limits  ?CBC - Abnormal; Notable for the following components:  ? Hemoglobin 6.2 (*)   ? HCT 24.9 (*)   ? MCV 61.5 (*)   ? MCH 15.3 (*)   ? MCHC 24.9 (*)   ? RDW 21.5 (*)   ? All other components within normal limits  ?HEMOGLOBIN A1C - Abnormal; Notable for the following components:  ? Hgb A1c MFr Bld 7.2 (*)   ? All other components within normal limits  ?COMPREHENSIVE METABOLIC PANEL - Abnormal; Notable for the following components:  ? Sodium 134 (*)   ? Glucose, Bld 219 (*)   ? Creatinine, Ser 1.02 (*)   ? Calcium 8.5 (*)   ? Albumin 3.3 (*)   ? Alkaline Phosphatase 37 (*)   ? All other components within normal limits  ?CBC - Abnormal; Notable for the following components:  ? Hemoglobin 8.4 (*)   ? HCT 29.4 (*)   ? MCV 65.3 (*)   ? MCH 18.7 (*)   ? MCHC 28.6 (*)   ? RDW 26.5 (*)   ? All  other components within normal limits  ?HEMOGLOBIN AND HEMATOCRIT, BLOOD - Abnormal; Notable for the following components:  ? Hemoglobin 8.7 (*)   ? HCT 30.9 (*)   ? All other components within normal limits  ?GLUCOSE, CAPILLARY - Abnormal; Notable for the following components:  ? Glucose-Capillary 192 (*)   ? All other components within normal limits  ?GLUCOSE, CAPILLARY - Abnormal; Notable for the following components:  ? Glucose-Capillary 203 (*)   ? All other components within normal limits  ?IRON AND TIBC - Abnormal; Notable for the following components:  ? Iron 22 (*)   ? TIBC 490 (*)   ? Saturation Ratios 5 (*)   ? All other components within normal limits  ?FERRITIN - Abnormal; Notable for the following components:  ? Ferritin 6 (*)   ? All other components within normal limits  ?GLUCOSE, CAPILLARY - Abnormal; Notable for the following components:  ? Glucose-Capillary 206 (*)   ? All other components within normal limits  ?RESP PANEL BY RT-PCR (FLU A&B, COVID) ARPGX2  ?MAGNESIUM  ?PHOSPHORUS  ?HIV ANTIBODY (ROUTINE TESTING W REFLEX)  ?TYPE AND SCREEN   ?PREPARE RBC (CROSSMATCH)  ?ABO/RH  ?PREPARE RBC (CROSSMATCH)  ?TROPONIN I (HIGH SENSITIVITY)  ?TROPONIN I (HIGH SENSITIVITY)  ? ?EKG ?ED ECG REPORT ?I, Naaman Plummer, the attending physician, personally viewed and interpreted this ECG. ?Date: 04/17/2021 ?EKG Time: 1150 ?Rate: 116 ?Rhythm: Tachycardic sinus rhythm ?QRS Axis: normal ?Intervals: normal ?ST/T Wave abnormalities: normal ?Narrative Interpretation: Tachycardic sinus rhythm.  No evidence of acute ischemia ?PROCEDURES: ?Critical Care performed: No ?Procedures ?MEDICATIONS ORDERED IN ED: ?Medications  ?0.9 %  sodium chloride infusion (0 mL/hr Intravenous Stopped 04/17/21 2200)  ?labetalol (NORMODYNE) injection 10 mg (10 mg Intravenous Given 04/17/21 2130)  ?labetalol (NORMODYNE) injection 20 mg (20 mg Intravenous Given 04/18/21 0150)  ?iron sucrose (VENOFER) 200 mg in sodium chloride 0.9 % 100 mL IVPB (0 mg Intravenous Stopped 04/18/21 1226)  ? ?IMPRESSION / MDM / ASSESSMENT AND PLAN / ED COURSE  ?I reviewed the triage vital signs and the nursing notes. ?             ?               ?Differential diagnosis includes, but is not limited to, abnormal vaginal bleeding, symptomatic anemia, menometrorrhagia, ectopic pregnancy ?The patient is on the cardiac monitor to evaluate for evidence of arrhythmia and/or significant heart rate changes. ?Patient is a 41 year old female that presents with signs and symptoms of symptomatic anemia in the setting of heavy periods.  Patient states that this has been common for her over most of her life but has never needed transfusion in the past.  Patient states that she has needed iron supplementation in the past and has been taking it on time and as prescribed.  I spoke to patient at length about plan for transfusion at this time and reassessing symptoms as she does not want to be admitted to the hospital at this time.  Patient pending transfusion at the time of signout ? ?Care of this patient will be signed out to the oncoming  physician at the end of my shift.  All pertinent patient information conveyed and all questions answered.  All further care and disposition decisions will be made by the oncoming physician. ? ?  ?FINAL CLINICAL IMPRESSION(S) / ED DIAGNOSES  ? ?Final diagnoses:  ?Menometrorrhagia  ?Symptomatic anemia  ? ?Rx / DC Orders  ? ?ED Discharge Orders   ? ?  Ordered  ?  ibuprofen (ADVIL) 800 MG tablet  Every 8 hours PRN       ? 04/18/21 1221  ?  Increase activity slowly       ? 04/18/21 1221  ?  Diet - low sodium heart healthy       ? 04/18/21 1221  ?  Diet Carb Modified       ? 04/18/21 1221  ?  Discharge instructions       ?Comments: Follow-up with Dr. Ouida Sills, gynecologist, as soon as possible  ? 04/18/21 1221  ?  ferrous sulfate 325 (65 FE) MG EC tablet  2 times daily       ? 04/17/21 1447  ? ?  ?  ? ?  ? ?Note:  This document was prepared using Dragon voice recognition software and may include unintentional dictation errors. ?  ?Naaman Plummer, MD ?04/22/21 [phone] ? ?

## 2021-04-17 NOTE — Assessment & Plan Note (Signed)
Continue CPAP at night ?

## 2021-04-17 NOTE — Assessment & Plan Note (Addendum)
Patient reports having diabetes for long time. ?She takes Levemir 60 units 3 times daily, NovoLog 30 units with meals. ?Obtain hemoglobin A1c, last known HbA1c 12.2 in 2017. ?Diabetic coordinator consult. ?Continue Levemir 60 units twice daily, regular insulin sliding scale. ?Continue gabapentin for neuropathy. ?

## 2021-04-18 ENCOUNTER — Encounter: Payer: Self-pay | Admitting: Family Medicine

## 2021-04-18 DIAGNOSIS — I10 Essential (primary) hypertension: Secondary | ICD-10-CM

## 2021-04-18 LAB — TYPE AND SCREEN
ABO/RH(D): B POS
Antibody Screen: NEGATIVE
Unit division: 0
Unit division: 0

## 2021-04-18 LAB — IRON AND TIBC
Iron: 22 ug/dL — ABNORMAL LOW (ref 28–170)
Saturation Ratios: 5 % — ABNORMAL LOW (ref 10.4–31.8)
TIBC: 490 ug/dL — ABNORMAL HIGH (ref 250–450)
UIBC: 468 ug/dL

## 2021-04-18 LAB — PHOSPHORUS: Phosphorus: 3.2 mg/dL (ref 2.5–4.6)

## 2021-04-18 LAB — GLUCOSE, CAPILLARY
Glucose-Capillary: 203 mg/dL — ABNORMAL HIGH (ref 70–99)
Glucose-Capillary: 206 mg/dL — ABNORMAL HIGH (ref 70–99)

## 2021-04-18 LAB — COMPREHENSIVE METABOLIC PANEL
ALT: 15 U/L (ref 0–44)
AST: 20 U/L (ref 15–41)
Albumin: 3.3 g/dL — ABNORMAL LOW (ref 3.5–5.0)
Alkaline Phosphatase: 37 U/L — ABNORMAL LOW (ref 38–126)
Anion gap: 8 (ref 5–15)
BUN: 13 mg/dL (ref 6–20)
CO2: 22 mmol/L (ref 22–32)
Calcium: 8.5 mg/dL — ABNORMAL LOW (ref 8.9–10.3)
Chloride: 104 mmol/L (ref 98–111)
Creatinine, Ser: 1.02 mg/dL — ABNORMAL HIGH (ref 0.44–1.00)
GFR, Estimated: >60 mL/min (ref >60)
Glucose, Bld: 219 mg/dL — ABNORMAL HIGH (ref 70–99)
Potassium: 3.5 mmol/L (ref 3.5–5.1)
Sodium: 134 mmol/L — ABNORMAL LOW (ref 135–145)
Total Bilirubin: 0.6 mg/dL (ref 0.3–1.2)
Total Protein: 7.2 g/dL (ref 6.5–8.1)

## 2021-04-18 LAB — HIV ANTIBODY (ROUTINE TESTING W REFLEX): HIV Screen 4th Generation wRfx: NONREACTIVE

## 2021-04-18 LAB — BPAM RBC
Blood Product Expiration Date: 202304032359
Blood Product Expiration Date: 202304132359
ISSUE DATE / TIME: 202303151412
ISSUE DATE / TIME: 202303151707
Unit Type and Rh: 7300
Unit Type and Rh: 7300

## 2021-04-18 LAB — CBC
HCT: 29.4 % — ABNORMAL LOW (ref 36.0–46.0)
Hemoglobin: 8.4 g/dL — ABNORMAL LOW (ref 12.0–15.0)
MCH: 18.7 pg — ABNORMAL LOW (ref 26.0–34.0)
MCHC: 28.6 g/dL — ABNORMAL LOW (ref 30.0–36.0)
MCV: 65.3 fL — ABNORMAL LOW (ref 80.0–100.0)
Platelets: 388 10*3/uL (ref 150–400)
RBC: 4.5 MIL/uL (ref 3.87–5.11)
RDW: 26.5 % — ABNORMAL HIGH (ref 11.5–15.5)
WBC: 10.1 10*3/uL (ref 4.0–10.5)
nRBC: 0.2 % (ref 0.0–0.2)

## 2021-04-18 LAB — MAGNESIUM: Magnesium: 1.7 mg/dL (ref 1.7–2.4)

## 2021-04-18 LAB — FERRITIN: Ferritin: 6 ng/mL — ABNORMAL LOW (ref 11–307)

## 2021-04-18 MED ORDER — SODIUM CHLORIDE 0.9 % IV SOLN
200.0000 mg | Freq: Once | INTRAVENOUS | Status: AC
  Administered 2021-04-18: 200 mg via INTRAVENOUS
  Filled 2021-04-18: qty 200

## 2021-04-18 MED ORDER — DIPHENHYDRAMINE HCL 25 MG PO CAPS
25.0000 mg | ORAL_CAPSULE | Freq: Four times a day (QID) | ORAL | Status: DC | PRN
  Administered 2021-04-18: 25 mg via ORAL
  Filled 2021-04-18: qty 1

## 2021-04-18 MED ORDER — IBUPROFEN 800 MG PO TABS: 800.0000 mg | ORAL_TABLET | Freq: Three times a day (TID) | ORAL | Status: DC | PRN

## 2021-04-18 MED ORDER — LABETALOL HCL 5 MG/ML IV SOLN
20.0000 mg | Freq: Once | INTRAVENOUS | Status: AC
  Administered 2021-04-18: 20 mg via INTRAVENOUS
  Filled 2021-04-18: qty 4

## 2021-04-18 NOTE — Progress Notes (Signed)
Inpatient Diabetes Program Recommendations ? ?AACE/ADA: New Consensus Statement on Inpatient Glycemic Control (2015) ? ?Target Ranges:  Prepandial:   less than 140 mg/dL ?     Peak postprandial:   less than 180 mg/dL (1-2 hours) ?     Critically ill patients:  140 - 180 mg/dL  ? ?Lab Results  ?Component Value Date  ? GLUCAP 203 (H) 04/18/2021  ? HGBA1C 12.2 (H) 10/08/2015  ? ? ?Review of Glycemic Control ? Latest Reference Range & Units 04/17/21 21:45 04/18/21 08:05  ?Glucose-Capillary 70 - 99 mg/dL 192 (H) 203 (H)  ? ?Diabetes history: DM 2 ?Outpatient Diabetes medications: Levemir 60 units bid, Novolog 30 units tid with meals ?Current orders for Inpatient glycemic control:  ?Levemir 50 units bid, Novolog sensitive tid with meals ? ?Inpatient Diabetes Program Recommendations:   ? ?Spoke with patient at bedside.  She states that she just saw PCP on Monday and got insulins refilled.  She states that blood sugars are usually high but she does not have a meter.  Encouraged patient to call clinic to see if they can get her a meter or go to [facility] and get Reli-[employer] meter for monitoring.  Explained importance of checking blood sugars and the dangers of taking insulin without knowing what blood sugars are. Patient verbalized understanding.  ? ?Thanks,  ?Adah Perl, RN, BC-ADM ?Inpatient Diabetes Coordinator ?Pager [phone]  (8a-5p) ? ? ? ? ?

## 2021-04-18 NOTE — Plan of Care (Signed)

## 2021-04-18 NOTE — Discharge Summary (Signed)
?Physician Discharge Summary ?  ?Patient: Jennifer [name] MRN: [id number] DOB: 1980-10-19  ?Admit date:     04/17/2021  ?Discharge date: 04/18/21  ?Discharge Physician: Jennye Boroughs  ? ?PCP: [facility]  ? ?Recommendations at discharge:  ? ?Follow up with PCP in 1 week ?Follow up with your gynecologist as soon as possible.  ? ?Discharge Diagnoses: ?Principal Problem: ?  Acute on chronic blood loss anemia ?Active Problems: ?  Type 2 diabetes mellitus with hyperosmolar nonketotic hyperglycemia ([place]) ?  OSA (obstructive sleep apnea) ?  Obesity ?  Essential hypertension ? ?Resolved Problems: ?  * No resolved hospital problems. * ? ?Hospital Course: ? ?Jennifer [name] is a 41 y.o. female with PMH significant for HTN, DM with neuropathy, GERD, obesity,OSA, iron deficiency anemia, who was referred to the ED because of low Hemoglobin, 6. She complained of fatigue, exertional shortness of breath and chest pain that has been going on for a few months. She also reported heavy menses.  ? ?She was admitted for acute on chronic blood loss anemia from menorrhagia. She was transfused with 2 units of prbcs and was given IV Iron infusion. She was evaluated by the gynecologist who recommended outpatient follow up. She also had hypertensive urgency on admission. Her home dose of Lisinopril was recently increased, by her PCP, to 40 mg because of poor BP control. Her condition has improved and she's deemed stable for discharge to home today.  ?  ? ? ?Consultants: Gynecologist ?Procedures performed: None  ?Disposition: Home ?Diet recommendation:  ?Discharge Diet Orders (From admission, onward)  ? ?  Start     Ordered  ? 04/18/21 0000  Diet - low sodium heart healthy       ? 04/18/21 1221  ? 04/18/21 0000  Diet Carb Modified       ? 04/18/21 1221  ? ?  ?  ? ?  ? ?Cardiac and Carb modified diet ?DISCHARGE MEDICATION: ?Allergies as of 04/18/2021   ? ?   Reactions  ? Demerol [meperidine] Hives, Itching, Nausea And  Vomiting  ? Meloxicam   ? Oxycodone Hives, Itching, Nausea And Vomiting  ? Toradol [ketorolac Tromethamine] Hives, Itching, Nausea And Vomiting  ? ?  ? ?  ?Medication List  ?  ? ?TAKE these medications   ? ?amoxicillin-clavulanate 875-125 MG tablet ?Commonly known as: AUGMENTIN ?Take 1 tablet by mouth 2 (two) times daily. ?  ?famotidine 20 MG tablet ?Commonly known as: PEPCID ?Take 20 mg by mouth 2 (two) times daily. ?  ?ferrous sulfate 325 (65 FE) MG EC tablet ?Take 1 tablet (325 mg total) by mouth 2 (two) times daily. ?  ?gabapentin 300 MG capsule ?Commonly known as: NEURONTIN ?Take 300 mg by mouth 3 (three) times daily. ?  ?ibuprofen 800 MG tablet ?Commonly known as: ADVIL ?Take 1 tablet (800 mg total) by mouth every 8 (eight) hours as needed for moderate pain. ?What changed:  ?when to take this ?reasons to take this ?  ?Levemir FlexPen 100 UNIT/ML FlexPen ?Generic drug: insulin detemir ?Inject 60 Units into the skin 3 (three) times daily. ?  ?lisinopril 40 MG tablet ?Commonly known as: ZESTRIL ?Take 40 mg by mouth daily. ?  ?metFORMIN 1000 MG tablet ?Commonly known as: GLUCOPHAGE ?Take 1 tablet (1,000 mg total) by mouth 2 (two) times daily with a meal. ?  ?NovoLOG FlexPen 100 UNIT/ML FlexPen ?Generic drug: insulin aspart ?Inject 30 Units into the skin 3 (three) times daily with meals. ?  ? ?  ? ?  Follow-up Information   ? ? [facility]   ?Contact information: ?[address] ?[place] [address] ?[phone] ? ?  ?  ? ?  ?  ? ?  ? ?Discharge Exam: ?Danley Danker Weights  ? 04/17/21 1837  ?Weight: 86.5 kg  ? ?GEN: NAD ?SKIN: No rash ?EYES: EOMI ?ENT: MMM ?CV: RRR ?PULM: CTA B ?ABD: soft, obese, NT, +BS ?CNS: AAO x 3, non focal ?EXT: No edema or tenderness ? ? ?Condition at discharge: good ? ?The results of significant diagnostics from this hospitalization (including imaging, microbiology, ancillary and laboratory) are listed below for reference.  ? ?Imaging Studies: ?DG Chest 2  View ? ?Result Date: 04/17/2021 ?CLINICAL DATA:  Anemia.  Chest pain. EXAM: CHEST - 2 VIEW COMPARISON:  One-view chest x-ray 10/29/2014 FINDINGS: Heart size is normal. Lungs are clear. Lung volumes are low. Visualized soft tissues and bony thorax are unremarkable. IMPRESSION: 1. Low lung volumes. 2. No acute cardiopulmonary disease. Electronically Signed   By: San Morelle M.D.   On: 04/17/2021 12:26  ? ?US PELVIC COMPLETE WITH TRANSVAGINAL ? ?Result Date: 04/17/2021 ?CLINICAL DATA:  Abnormal uterine bleeding. EXAM: TRANSABDOMINAL AND TRANSVAGINAL ULTRASOUND OF PELVIS TECHNIQUE: Both transabdominal and transvaginal ultrasound examinations of the pelvis were performed. Transabdominal technique was performed for global imaging of the pelvis including uterus, ovaries, adnexal regions, and pelvic cul-de-sac. It was necessary to proceed with endovaginal exam following the transabdominal exam to visualize the endometrium and ovaries. COMPARISON:  None FINDINGS: Uterus Measurements: 13.7 x 7.7 x 10.6 cm = volume: 580 mL. The uterus is enlarged and myomatous. Multiple fibroids including a 5.6 x 5.9 x 6.8 cm fundal fibroid, a 5.4 x 4.4 x 4.8 cm right posterior body fibroid, a 4.5 x 4.0 x 3.5 cm right upper posterior body fibroid, and a 10.1 x 8.3 x 9.0 cm exophytic right fundal fibroid. Endometrium Thickness: 9 mm. The endometrium is poorly visualized due to uterus heterogeneity and fibroids and suboptimally evaluated. Right ovary Measurements: 4.1 x 2.1 x 2.7 cm = volume: 12 mL. Normal appearance/no adnexal mass. Left ovary Measurements: 4.5 x 3.0 x 3.1 cm = volume: 22 mL. There is a 3 cm complex/proteinaceous cyst or corpus luteum in the left ovary. Other findings No abnormal free fluid. IMPRESSION: 1. Enlarged myomatous uterus. 2. Suboptimal visualization and evaluation of the endometrium. 3. Left ovarian complex/hemorrhagic corpus luteum. Electronically Signed   By: Anner Crete M.D.   On: 04/17/2021 21:35    ? ?Microbiology: ?Results for orders placed or performed during the hospital encounter of 04/17/21  ?Resp Panel by RT-PCR (Flu A&B, Covid) Nasopharyngeal Swab     Status: None  ? Collection Time: 04/17/21  3:37 PM  ? Specimen: Nasopharyngeal Swab; Nasopharyngeal(NP) swabs in vial transport medium  ?Result Value Ref Range Status  ? SARS Coronavirus 2 by RT PCR NEGATIVE NEGATIVE Final  ?  Comment: (NOTE) ?SARS-CoV-2 target nucleic acids are NOT DETECTED. ? ?The SARS-CoV-2 RNA is generally detectable in upper respiratory ?specimens during the acute phase of infection. The lowest ?concentration of SARS-CoV-2 viral copies this assay can detect is ?138 copies/mL. A negative result does not preclude SARS-Cov-2 ?infection and should not be used as the sole basis for treatment or ?other patient management decisions. A negative result may occur with  ?improper specimen collection/handling, submission of specimen other ?than nasopharyngeal swab, presence of viral mutation(s) within the ?areas targeted by this assay, and inadequate number of viral ?copies(<138 copies/mL). A negative result must be combined with ?  clinical observations, patient history, and epidemiological ?information. The expected result is Negative. ? ?Fact Sheet for Patients:  ?EntrepreneurPulse.com.au ? ?Fact Sheet for Healthcare Providers:  ?IncredibleEmployment.be ? ?This test is no t yet approved or cleared by the [place] FDA and  ?has been authorized for detection and/or diagnosis of SARS-CoV-2 by ?FDA under an Emergency Use Authorization (EUA). This EUA will remain  ?in effect (meaning this test can be used) for the duration of the ?COVID-19 declaration under Section 564(b)(1) of the Act, 21 ?U.S.C.section 360bbb-3(b)(1), unless the authorization is terminated  ?or revoked sooner.  ? ? ?  ? Influenza A by PCR NEGATIVE NEGATIVE Final  ? Influenza B by PCR NEGATIVE NEGATIVE Final  ?  Comment: (NOTE) ?The Xpert  Xpress SARS-CoV-2/FLU/RSV plus assay is intended as an aid ?in the diagnosis of influenza from Nasopharyngeal swab specimens and ?should not be used as a sole basis for treatment. Nasal washings and ?aspirates are

## 2021-04-18 NOTE — TOC CM/SW Note (Signed)
Patient has orders to discharge home today. Chart reviewed. PCP is [facility]. On room air. No wounds. No insurance but just discharging on iron. No TOC needs identified. CSW signing off. ? ?[place], [place] ?[phone] ? ?

## 2021-04-18 NOTE — Consult Note (Signed)
Reason for Consult:History of menorrhagia ?Referring Physician: Dr. Dwyane Dee ? ?Jennifer [name] is an 41 y.o. female.  ?HPI: She reports she has had 5-6 years of menorrhagia. Her menstrual cycle generally last 7 days. Her bleeding will saturate a pad and tampon every 30 minutes. She also has spotting throughout the month generally daily. She has severe pain as well which limits her activities. She remember having a fibroid that was removed at the time of her cesarean section in the past. She has a history of a tubal ligation and is done with child bearing.  She was feeling fatigued when she saw her PCP for lab work and was found to be anemic.She is expecting her menstrual cycle later this week.   ? ?Past Medical History:  ?Diagnosis Date  ? Diabetes mellitus without complication ([place])   ? Hypertension   ? ? ?Past Surgical History:  ?Procedure Laterality Date  ? CESAREAN SECTION    ? x2  ? none    ? ? ?Family History  ?Problem Relation Age of Onset  ? Diabetes Other   ? Hypertension Other   ? ? ?Social History:  reports that she has been smoking. She has never used smokeless tobacco. She reports that she does not drink alcohol and does not use drugs. ? ?Allergies:  ?Allergies  ?Allergen Reactions  ? Demerol [Meperidine] Hives, Itching and Nausea And Vomiting  ? Meloxicam   ? Oxycodone Hives, Itching and Nausea And Vomiting  ? Toradol [Ketorolac Tromethamine] Hives, Itching and Nausea And Vomiting  ? ? ?Medications: I have reviewed the patient's current medications. ? ?Results for orders placed or performed during the hospital encounter of 04/17/21 (from the past 48 hour(s))  ?Basic metabolic panel     Status: Abnormal  ? Collection Time: 04/17/21 12:18 PM  ?Result Value Ref Range  ? Sodium 136 135 - 145 mmol/L  ? Potassium 3.9 3.5 - 5.1 mmol/L  ? Chloride 106 98 - 111 mmol/L  ? CO2 21 (L) 22 - 32 mmol/L  ? Glucose, Bld 231 (H) 70 - 99 mg/dL  ?  Comment: Glucose reference range applies only to samples taken after fasting  for at least 8 hours.  ? BUN 15 6 - 20 mg/dL  ? Creatinine, Ser 1.01 (H) 0.44 - 1.00 mg/dL  ? Calcium 9.0 8.9 - 10.3 mg/dL  ? GFR, Estimated >60 >60 mL/min  ?  Comment: (NOTE) ?Calculated using the CKD-EPI Creatinine Equation (2021) ?  ? Anion gap 9 5 - 15  ?  Comment: Performed at [facility], [address] [place], [place] [address]  ?CBC     Status: Abnormal  ? Collection Time: 04/17/21 12:18 PM  ?Result Value Ref Range  ? WBC 8.1 4.0 - 10.5 K/uL  ? RBC 4.05 3.87 - 5.11 MIL/uL  ? Hemoglobin 6.2 (L) 12.0 - 15.0 g/dL  ?  Comment: Reticulocyte Hemoglobin testing ?may be clinically indicated, ?consider ordering this additional ?test [id number] ?  ? HCT 24.9 (L) 36.0 - 46.0 %  ? MCV 61.5 (L) 80.0 - 100.0 fL  ? MCH 15.3 (L) 26.0 - 34.0 pg  ? MCHC 24.9 (L) 30.0 - 36.0 g/dL  ? RDW 21.5 (H) 11.5 - 15.5 %  ? Platelets 381 150 - 400 K/uL  ?  Comment: REPEATED TO VERIFY  ? nRBC 0.0 0.0 - 0.2 %  ?  Comment: Performed at [facility], [address], [place],  [address]  ?Troponin I (High Sensitivity)  Status: None  ? Collection Time: 04/17/21 12:18 PM  ?Result Value Ref Range  ? Troponin I (High Sensitivity) 7 <18 ng/L  ?  Comment: (NOTE) ?Elevated high sensitivity troponin I (hsTnI) values and significant  ?changes across serial measurements may suggest ACS but many other  ?chronic and acute conditions are known to elevate hsTnI results.  ?Refer to the "Links" section for chest pain algorithms and additional  ?guidance. ?Performed at [facility], [place], ?[place] [address] ?  ?Prepare RBC (crossmatch)     Status: None  ? Collection Time: 04/17/21 12:18 PM  ?Result Value Ref Range  ? Order Confirmation    ?  ORDER PROCESSED BY BLOOD BANK ?Performed at [facility], [address], [place], [place] [address] ?  ?ABO/Rh     Status: None  ? Collection Time: 04/17/21 12:18 PM  ?Result Value Ref Range  ? ABO/RH(D)    ?  B POS ?Performed at [facility],  [address] [place], [place] [address] ?  ?Type and screen [facility]     Status: None (Preliminary result)  ? Collection Time: 04/17/21 12:20 PM  ?Result Value Ref Range  ? ABO/RH(D) B POS   ? Antibody Screen NEG   ? Sample Expiration 04/20/2021,2359   ? Unit Number [id number]   ? Blood Component Type RED CELLS,LR   ? Unit division 00   ? Status of Unit ISSUED   ? Transfusion Status OK TO TRANSFUSE   ? Crossmatch Result Compatible   ? Unit Number [id number]   ? Blood Component Type RED CELLS,LR   ? Unit division 00   ? Status of Unit ISSUED   ? Transfusion Status OK TO TRANSFUSE   ? Crossmatch Result    ?  Compatible ?Performed at [facility], [address] [place], [place] [address] ?  ?Troponin I (High Sensitivity)     Status: None  ? Collection Time: 04/17/21  2:42 PM  ?Result Value Ref Range  ? Troponin I (High Sensitivity) 8 <18 ng/L  ?  Comment: (NOTE) ?Elevated high sensitivity troponin I (hsTnI) values and significant  ?changes across serial measurements may suggest ACS but many other  ?chronic and acute conditions are known to elevate hsTnI results.  ?Refer to the "Links" section for chest pain algorithms and additional  ?guidance. ?Performed at [facility], [place], ?[place] [address] ?  ?Resp Panel by RT-PCR (Flu A&B, Covid) Nasopharyngeal Swab     Status: None  ? Collection Time: 04/17/21  3:37 PM  ? Specimen: Nasopharyngeal Swab; Nasopharyngeal(NP) swabs in vial transport medium  ?Result Value Ref Range  ? SARS Coronavirus 2 by RT PCR NEGATIVE NEGATIVE  ?  Comment: (NOTE) ?SARS-CoV-2 target nucleic acids are NOT DETECTED. ? ?The SARS-CoV-2 RNA is generally detectable in upper respiratory ?specimens during the acute phase of infection. The lowest ?concentration of SARS-CoV-2 viral copies this assay can detect is ?138 copies/mL. A negative result does not preclude SARS-Cov-2 ?infection and should not be used as the sole basis for  treatment or ?other patient management decisions. A negative result may occur with  ?improper specimen collection/handling, submission of specimen other ?than nasopharyngeal swab, presence of viral mutation(s) within the ?areas targeted by this assay, and inadequate number of viral ?copies(<138 copies/mL). A negative result must be combined with ?clinical observations, patient history, and epidemiological ?information. The expected result is Negative. ? ?Fact Sheet for Patients:  ?EntrepreneurPulse.com.au ? ?  Fact Sheet for Healthcare Providers:  ?IncredibleEmployment.be ? ?This test is no t yet approved or cleared by the [place] FDA and  ?has been authorized for detection and/or diagnosis of SARS-CoV-2 by ?FDA under an Emergency Use Authorization (EUA). This EUA will remain  ?in effect (meaning this test can be used) for the duration of the ?COVID-19 declaration under Section 564(b)(1) of the Act, 21 ?U.S.C.section 360bbb-3(b)(1), unless the authorization is terminated  ?or revoked sooner.  ? ? ?  ? Influenza A by PCR NEGATIVE NEGATIVE  ? Influenza B by PCR NEGATIVE NEGATIVE  ?  Comment: (NOTE) ?The Xpert Xpress SARS-CoV-2/FLU/RSV plus assay is intended as an aid ?in the diagnosis of influenza from Nasopharyngeal swab specimens and ?should not be used as a sole basis for treatment. Nasal washings and ?aspirates are unacceptable for Xpert Xpress SARS-CoV-2/FLU/RSV ?testing. ? ?Fact Sheet for Patients: ?EntrepreneurPulse.com.au ? ?Fact Sheet for Healthcare Providers: ?IncredibleEmployment.be ? ?This test is not yet approved or cleared by the [place] FDA and ?has been authorized for detection and/or diagnosis of SARS-CoV-2 by ?FDA under an Emergency Use Authorization (EUA). This EUA will remain ?in effect (meaning this test can be used) for the duration of the ?COVID-19 declaration under Section 564(b)(1) of the Act, 21 U.S.C. ?section  360bbb-3(b)(1), unless the authorization is terminated or ?revoked. ? ?Performed at [facility], [place], ?[place] [address] ?  ?Prepare RBC (crossmatch)     Status: None  ? Co

## 2021-04-18 NOTE — Plan of Care (Signed)

## 2021-04-19 LAB — HEMOGLOBIN A1C
Hgb A1c MFr Bld: 7.2 % — ABNORMAL HIGH (ref 4.8–5.6)
Mean Plasma Glucose: 160 mg/dL

## 2021-04-23 ENCOUNTER — Telehealth: Payer: Self-pay

## 2021-04-29 ENCOUNTER — Emergency Department: Payer: Self-pay

## 2021-04-29 ENCOUNTER — Encounter: Payer: Self-pay | Admitting: Emergency Medicine

## 2021-04-29 ENCOUNTER — Emergency Department
Admission: EM | Admit: 2021-04-29 | Discharge: 2021-04-29 | Disposition: A | Discharge status: Home / Self Care | Payer: Self-pay | Attending: Emergency Medicine | Admitting: Emergency Medicine

## 2021-04-29 ENCOUNTER — Other Ambulatory Visit: Payer: Self-pay

## 2021-04-29 DIAGNOSIS — R519 Headache, unspecified: Secondary | ICD-10-CM | POA: Insufficient documentation

## 2021-04-29 DIAGNOSIS — E119 Type 2 diabetes mellitus without complications: Secondary | ICD-10-CM | POA: Insufficient documentation

## 2021-04-29 DIAGNOSIS — I10 Essential (primary) hypertension: Secondary | ICD-10-CM | POA: Insufficient documentation

## 2021-04-29 LAB — CBC
HCT: 33.8 % — ABNORMAL LOW (ref 36.0–46.0)
Hemoglobin: 9.7 g/dL — ABNORMAL LOW (ref 12.0–15.0)
MCH: 20.1 pg — ABNORMAL LOW (ref 26.0–34.0)
MCHC: 28.7 g/dL — ABNORMAL LOW (ref 30.0–36.0)
MCV: 70 fL — ABNORMAL LOW (ref 80.0–100.0)
Platelets: 443 10*3/uL — ABNORMAL HIGH (ref 150–400)
RBC: 4.83 MIL/uL (ref 3.87–5.11)
RDW: 30.6 % — ABNORMAL HIGH (ref 11.5–15.5)
WBC: 7.6 10*3/uL (ref 4.0–10.5)
nRBC: 0 % (ref 0.0–0.2)

## 2021-04-29 LAB — COMPREHENSIVE METABOLIC PANEL
ALT: 26 U/L (ref 0–44)
AST: 32 U/L (ref 15–41)
Albumin: 4 g/dL (ref 3.5–5.0)
Alkaline Phosphatase: 53 U/L (ref 38–126)
Anion gap: 13 (ref 5–15)
BUN: 12 mg/dL (ref 6–20)
CO2: 22 mmol/L (ref 22–32)
Calcium: 9.2 mg/dL (ref 8.9–10.3)
Chloride: 101 mmol/L (ref 98–111)
Creatinine, Ser: 0.95 mg/dL (ref 0.44–1.00)
GFR, Estimated: >60 mL/min (ref >60)
Glucose, Bld: 154 mg/dL — ABNORMAL HIGH (ref 70–99)
Potassium: 3.8 mmol/L (ref 3.5–5.1)
Sodium: 136 mmol/L (ref 135–145)
Total Bilirubin: 0.6 mg/dL (ref 0.3–1.2)
Total Protein: 7.8 g/dL (ref 6.5–8.1)

## 2021-04-29 MED ORDER — LABETALOL HCL 5 MG/ML IV SOLN
10.0000 mg | Freq: Once | INTRAVENOUS | Status: AC
  Administered 2021-04-29: 10 mg via INTRAVENOUS
  Filled 2021-04-29: qty 4

## 2021-04-29 NOTE — ED Notes (Signed)
Pt d/c home per MD order, Discharge summary reviewed with pt, pt verbalizes understanding. Ambulatory off unit. No s/s of acute distress noted at discharge.  ?

## 2021-04-29 NOTE — ED Triage Notes (Signed)
Sent from Jennifer [name] for ED evaluation for headache x 1 week, left sided weakness since yesterday and HTN.  Patient has history of HTN and takes HCTZ and Lisinopril.  Per report, BP 100/100 at [facility]. ? ?Patient arrives AAOx3.  Skin warm and dry. NAD.  MAE equally and strong.  ?

## 2021-04-30 NOTE — ED Provider Notes (Signed)
? ?[facility] ?Provider Note ? ? ? Event Date/Time  ? First MD Initiated Contact with Patient 04/29/21 1025   ?  (approximate) ? ? ?History  ? ?Hypertension ? ? ?HPI ? ?Jennifer [name] is a 41 y.o. female with a history of anemia, diabetes, hypertension who presents for evaluation of high blood pressure.  Patient reports she was at outpatient appointment and was referred to the emergency department because of elevated blood pressure.  She reports she is compliant with her medications however she has not been on her lisinopril reportedly.  Review of record demonstrates the patient was admitted to the hospital and discharged on 16 March for anemia ? ?  ? ? ?Physical Exam  ? ?Triage Vital Signs: ?ED Triage Vitals  ?Enc Vitals Group  ?   BP 04/29/21 1030 (!) 243/136  ?   Pulse Rate 04/29/21 1030 97  ?   Resp 04/29/21 1030 16  ?   Temp 04/29/21 1030 98.2 ?F (36.8 ?C)  ?   Temp Source 04/29/21 1030 Oral  ?   SpO2 04/29/21 1030 98 %  ?   Weight 04/29/21 1020 86.5 kg (190 lb 11.2 oz)  ?   Height 04/29/21 1020 1.676 m ('5\' 6"'$ )  ?   Head Circumference --   ?   Peak Flow --   ?   Pain Score 04/29/21 1020 7  ?   Pain Loc --   ?   Pain Edu? --   ?   Excl. in [place]? --   ? ? ?Most recent vital signs: ?Vitals:  ? 04/29/21 1103 04/29/21 1138  ?BP: (!) 185/105 (!) 184/99  ?Pulse:  78  ?Resp:  16  ?Temp:  98.7 ?F (37.1 ?C)  ?SpO2:  98%  ? ? ? ?General: Awake, no distress.  ?CV:  Good peripheral perfusion.  Regular rate and rhythm ?Resp:  Normal effort.  CTA B ?Abd:  No distention.  ?Other:  Neuro exam: No weakness, no facial droop, cranial nerves intact.  Normal ambulation, PERRLA, EOMI ? ? ?ED Results / Procedures / Treatments  ? ?Labs ?(all labs ordered are listed, but only abnormal results are displayed) ?Labs Reviewed  ?CBC - Abnormal; Notable for the following components:  ?    Result Value  ? Hemoglobin 9.7 (*)   ? HCT 33.8 (*)   ? MCV 70.0 (*)   ? MCH 20.1 (*)   ? MCHC 28.7 (*)   ? RDW 30.6 (*)   ? Platelets  443 (*)   ? All other components within normal limits  ?COMPREHENSIVE METABOLIC PANEL - Abnormal; Notable for the following components:  ? Glucose, Bld 154 (*)   ? All other components within normal limits  ? ? ? ?EKG ? ? ?RADIOLOGY ?CT head viewed by me, no acute abnormality ? ? ? ?PROCEDURES: ? ?Critical Care performed:  ? ?Procedures ? ? ?MEDICATIONS ORDERED IN ED: ?Medications  ?labetalol (NORMODYNE) injection 10 mg (10 mg Intravenous Given 04/29/21 1057)  ? ? ? ?IMPRESSION / MDM / ASSESSMENT AND PLAN / ED COURSE  ?I reviewed the triage vital signs and the nursing notes. ? ?Patient presents with concerns of elevated blood pressure.  Triage note states left-sided weakness however on my discussions with patient she does not have any acute weakness subjectively or objectively.  She does report that she has been prescribed hydrochlorothiazide and lisinopril but it sounds as though she has not had her lisinopril recently.  She has had a  headache recently which has improved ? ?Blood pressure was over 827 systolic at outpatient appointment. ? ?Initial blood pressure elevated in the emergency department, given IV labetalol, sent for CT head given complaints of headache which is reassuring. ? ?Lab work demonstrates normal CMP, normal CBC, normal glucose ? ?Work-up is overall reassuring, blood pressure is improved somewhat, will need to be sure to restart lisinopril, take hydrochlorothiazide, keep careful track of blood pressure on blood pressure monitor, outpatient follow-up.  Considered admission for elevated blood pressure however her blood pressure is similar to typical blood pressures in which she was discharged with last week and I suspect we can obtain improvement in blood pressure with close compliance to medications ? ? ? ? ? ?  ? ? ?FINAL CLINICAL IMPRESSION(S) / ED DIAGNOSES  ? ?Final diagnoses:  ?Primary hypertension  ? ? ? ?Rx / DC Orders  ? ?ED Discharge Orders   ? ? None  ? ?  ? ? ? ?Note:  This document was  prepared using Dragon voice recognition software and may include unintentional dictation errors. ?  ?Lavonia Drafts, MD ?04/30/21 0800 ? ?

## 2021-10-13 ENCOUNTER — Emergency Department: Payer: 59

## 2021-10-13 ENCOUNTER — Observation Stay
Admission: EM | Admit: 2021-10-13 | Discharge: 2021-10-16 | Disposition: A | Discharge status: Home / Self Care | Payer: 59 | Attending: Hospitalist | Admitting: Hospitalist

## 2021-10-13 ENCOUNTER — Other Ambulatory Visit: Payer: Self-pay

## 2021-10-13 DIAGNOSIS — D62 Acute posthemorrhagic anemia: Secondary | ICD-10-CM | POA: Diagnosis not present

## 2021-10-13 DIAGNOSIS — N179 Acute kidney failure, unspecified: Secondary | ICD-10-CM | POA: Diagnosis not present

## 2021-10-13 DIAGNOSIS — E1042 Type 1 diabetes mellitus with diabetic polyneuropathy: Secondary | ICD-10-CM | POA: Insufficient documentation

## 2021-10-13 DIAGNOSIS — E1010 Type 1 diabetes mellitus with ketoacidosis without coma: Secondary | ICD-10-CM | POA: Diagnosis not present

## 2021-10-13 DIAGNOSIS — D259 Leiomyoma of uterus, unspecified: Secondary | ICD-10-CM

## 2021-10-13 DIAGNOSIS — I160 Hypertensive urgency: Secondary | ICD-10-CM | POA: Diagnosis not present

## 2021-10-13 DIAGNOSIS — N920 Excessive and frequent menstruation with regular cycle: Secondary | ICD-10-CM | POA: Diagnosis not present

## 2021-10-13 DIAGNOSIS — E1142 Type 2 diabetes mellitus with diabetic polyneuropathy: Secondary | ICD-10-CM

## 2021-10-13 DIAGNOSIS — R739 Hyperglycemia, unspecified: Secondary | ICD-10-CM

## 2021-10-13 DIAGNOSIS — R7309 Other abnormal glucose: Secondary | ICD-10-CM

## 2021-10-13 DIAGNOSIS — I10 Essential (primary) hypertension: Secondary | ICD-10-CM

## 2021-10-13 DIAGNOSIS — D649 Anemia, unspecified: Secondary | ICD-10-CM | POA: Diagnosis present

## 2021-10-13 DIAGNOSIS — N921 Excessive and frequent menstruation with irregular cycle: Secondary | ICD-10-CM

## 2021-10-13 DIAGNOSIS — N189 Chronic kidney disease, unspecified: Secondary | ICD-10-CM

## 2021-10-13 HISTORY — DX: Acute posthemorrhagic anemia: D62

## 2021-10-13 HISTORY — DX: Hypertensive urgency: I16.0

## 2021-10-13 HISTORY — DX: Chronic kidney disease, unspecified: N17.9

## 2021-10-13 LAB — BASIC METABOLIC PANEL
Anion gap: 11 (ref 5–15)
BUN: 16 mg/dL (ref 6–20)
CO2: 17 mmol/L — ABNORMAL LOW (ref 22–32)
Calcium: 8.5 mg/dL — ABNORMAL LOW (ref 8.9–10.3)
Chloride: 104 mmol/L (ref 98–111)
Creatinine, Ser: 1.53 mg/dL — ABNORMAL HIGH (ref 0.44–1.00)
GFR, Estimated: 43 mL/min — ABNORMAL LOW (ref >60)
Glucose, Bld: 479 mg/dL — ABNORMAL HIGH (ref 70–99)
Potassium: 3.8 mmol/L (ref 3.5–5.1)
Sodium: 132 mmol/L — ABNORMAL LOW (ref 135–145)

## 2021-10-13 LAB — CBC
HCT: 25.7 % — ABNORMAL LOW (ref 36.0–46.0)
Hemoglobin: 6.8 g/dL — ABNORMAL LOW (ref 12.0–15.0)
MCH: 18 pg — ABNORMAL LOW (ref 26.0–34.0)
MCHC: 26.5 g/dL — ABNORMAL LOW (ref 30.0–36.0)
MCV: 68.2 fL — ABNORMAL LOW (ref 80.0–100.0)
Platelets: 523 10*3/uL — ABNORMAL HIGH (ref 150–400)
RBC: 3.77 MIL/uL — ABNORMAL LOW (ref 3.87–5.11)
RDW: 21.3 % — ABNORMAL HIGH (ref 11.5–15.5)
WBC: 9 10*3/uL (ref 4.0–10.5)
nRBC: 0 % (ref 0.0–0.2)

## 2021-10-13 LAB — HEPATIC FUNCTION PANEL
ALT: 23 U/L (ref 0–44)
AST: 37 U/L (ref 15–41)
Albumin: 3.3 g/dL — ABNORMAL LOW (ref 3.5–5.0)
Alkaline Phosphatase: 53 U/L (ref 38–126)
Bilirubin, Direct: <0.1 mg/dL (ref 0.0–0.2)
Total Bilirubin: 0.6 mg/dL (ref 0.3–1.2)
Total Protein: 7.5 g/dL (ref 6.5–8.1)

## 2021-10-13 LAB — TROPONIN I (HIGH SENSITIVITY)
Troponin I (High Sensitivity): 8 ng/L (ref <18)
Troponin I (High Sensitivity): 11 ng/L (ref <18)

## 2021-10-13 LAB — BLOOD GAS, VENOUS
Acid-base deficit: 4.8 mmol/L — ABNORMAL HIGH (ref 0.0–2.0)
Bicarbonate: 20.5 mmol/L (ref 20.0–28.0)
O2 Saturation: 65.3 %
Patient temperature: 37
pCO2, Ven: 38 mmHg — ABNORMAL LOW (ref 44–60)
pH, Ven: 7.34 (ref 7.25–7.43)
pO2, Ven: 38 mmHg (ref 32–45)

## 2021-10-13 LAB — URINALYSIS, ROUTINE W REFLEX MICROSCOPIC
Bacteria, UA: NONE SEEN
Bilirubin Urine: NEGATIVE
Glucose, UA: >=500 mg/dL — AB
Hgb urine dipstick: NEGATIVE
Ketones, ur: NEGATIVE mg/dL
Leukocytes,Ua: NEGATIVE
Nitrite: NEGATIVE
Protein, ur: NEGATIVE mg/dL
Specific Gravity, Urine: 1.02 (ref 1.005–1.030)
pH: 5 (ref 5.0–8.0)

## 2021-10-13 LAB — BETA-HYDROXYBUTYRIC ACID: Beta-Hydroxybutyric Acid: 0.12 mmol/L (ref 0.05–0.27)

## 2021-10-13 LAB — GLUCOSE, CAPILLARY: Glucose-Capillary: 286 mg/dL — ABNORMAL HIGH (ref 70–99)

## 2021-10-13 LAB — PREPARE RBC (CROSSMATCH)

## 2021-10-13 LAB — CBG MONITORING, ED: Glucose-Capillary: 317 mg/dL — ABNORMAL HIGH (ref 70–99)

## 2021-10-13 MED ORDER — INSULIN ASPART 100 UNIT/ML IJ SOLN
0.0000 [IU] | Freq: Three times a day (TID) | INTRAMUSCULAR | Status: DC
  Administered 2021-10-14: 4 [IU] via SUBCUTANEOUS
  Administered 2021-10-14: 15 [IU] via SUBCUTANEOUS
  Administered 2021-10-14: 4 [IU] via SUBCUTANEOUS
  Administered 2021-10-15: 15 [IU] via SUBCUTANEOUS
  Administered 2021-10-15 – 2021-10-16 (×3): 11 [IU] via SUBCUTANEOUS
  Administered 2021-10-16: 7 [IU] via SUBCUTANEOUS
  Filled 2021-10-13 (×7): qty 1

## 2021-10-13 MED ORDER — FAMOTIDINE 20 MG PO TABS
20.0000 mg | ORAL_TABLET | Freq: Every day | ORAL | Status: DC
  Administered 2021-10-14 – 2021-10-16 (×3): 20 mg via ORAL
  Filled 2021-10-13 (×3): qty 1

## 2021-10-13 MED ORDER — ACETAMINOPHEN 650 MG RE SUPP: 650.0000 mg | Freq: Four times a day (QID) | RECTAL | Status: DC | PRN

## 2021-10-13 MED ORDER — INSULIN REGULAR HUMAN 100 UNIT/ML IJ SOLN: 30.0000 [IU] | Freq: Three times a day (TID) | INTRAMUSCULAR | Status: DC

## 2021-10-13 MED ORDER — INSULIN ASPART 100 UNIT/ML IJ SOLN
60.0000 [IU] | Freq: Three times a day (TID) | INTRAMUSCULAR | Status: DC
  Administered 2021-10-14: 60 [IU] via SUBCUTANEOUS
  Filled 2021-10-13: qty 1

## 2021-10-13 MED ORDER — FERROUS SULFATE 325 (65 FE) MG PO TABS
325.0000 mg | ORAL_TABLET | Freq: Two times a day (BID) | ORAL | Status: DC
  Administered 2021-10-13 – 2021-10-16 (×6): 325 mg via ORAL
  Filled 2021-10-13 (×6): qty 1

## 2021-10-13 MED ORDER — TRAZODONE HCL 50 MG PO TABS
25.0000 mg | ORAL_TABLET | Freq: Every evening | ORAL | Status: DC | PRN
  Administered 2021-10-13 – 2021-10-15 (×3): 25 mg via ORAL
  Filled 2021-10-13 (×3): qty 1

## 2021-10-13 MED ORDER — INSULIN ASPART 100 UNIT/ML IV SOLN
5.0000 [IU] | Freq: Once | INTRAVENOUS | Status: AC
  Administered 2021-10-13: 5 [IU] via INTRAVENOUS
  Filled 2021-10-13: qty 0.05

## 2021-10-13 MED ORDER — ONDANSETRON HCL 4 MG/2ML IJ SOLN
4.0000 mg | Freq: Four times a day (QID) | INTRAMUSCULAR | Status: DC | PRN
  Administered 2021-10-14 (×2): 4 mg via INTRAVENOUS
  Filled 2021-10-13 (×2): qty 2

## 2021-10-13 MED ORDER — SODIUM CHLORIDE 0.9 % IV SOLN: 10.0000 mL/h | Freq: Once | INTRAVENOUS | Status: DC

## 2021-10-13 MED ORDER — MAGNESIUM HYDROXIDE 400 MG/5ML PO SUSP
30.0000 mL | Freq: Every day | ORAL | Status: DC | PRN
  Administered 2021-10-15: 30 mL via ORAL
  Filled 2021-10-13: qty 30

## 2021-10-13 MED ORDER — LABETALOL HCL 5 MG/ML IV SOLN
20.0000 mg | Freq: Once | INTRAVENOUS | Status: AC
  Administered 2021-10-13: 20 mg via INTRAVENOUS
  Filled 2021-10-13: qty 4

## 2021-10-13 MED ORDER — INSULIN ASPART 100 UNIT/ML IJ SOLN
0.0000 [IU] | Freq: Every day | INTRAMUSCULAR | Status: DC
  Administered 2021-10-14: 4 [IU] via SUBCUTANEOUS
  Administered 2021-10-15: 2 [IU] via SUBCUTANEOUS
  Filled 2021-10-13 (×2): qty 1

## 2021-10-13 MED ORDER — SODIUM CHLORIDE 0.9 % IV SOLN: INTRAVENOUS | Status: DC

## 2021-10-13 MED ORDER — SODIUM CHLORIDE 0.9 % IV BOLUS
1000.0000 mL | Freq: Once | INTRAVENOUS | Status: AC
  Administered 2021-10-13: 1000 mL via INTRAVENOUS

## 2021-10-13 MED ORDER — ACETAMINOPHEN 325 MG PO TABS
650.0000 mg | ORAL_TABLET | Freq: Once | ORAL | Status: AC
  Administered 2021-10-13: 650 mg via ORAL
  Filled 2021-10-13: qty 2

## 2021-10-13 MED ORDER — INSULIN DETEMIR 100 UNIT/ML FLEXPEN
60.0000 [IU] | PEN_INJECTOR | Freq: Three times a day (TID) | SUBCUTANEOUS | Status: DC
Start: 2021-10-13 — End: 2021-10-13

## 2021-10-13 MED ORDER — LABETALOL HCL 5 MG/ML IV SOLN
5.0000 mg | Freq: Once | INTRAVENOUS | Status: AC
  Administered 2021-10-13: 5 mg via INTRAVENOUS
  Filled 2021-10-13: qty 4

## 2021-10-13 MED ORDER — GABAPENTIN 300 MG PO CAPS
300.0000 mg | ORAL_CAPSULE | Freq: Three times a day (TID) | ORAL | Status: DC
  Administered 2021-10-13 – 2021-10-16 (×8): 300 mg via ORAL
  Filled 2021-10-13 (×8): qty 1

## 2021-10-13 MED ORDER — ONDANSETRON HCL 4 MG PO TABS
4.0000 mg | ORAL_TABLET | Freq: Four times a day (QID) | ORAL | Status: DC | PRN
  Filled 2021-10-13: qty 1

## 2021-10-13 MED ORDER — HYDROMORPHONE HCL 1 MG/ML IJ SOLN
1.0000 mg | INTRAMUSCULAR | Status: DC | PRN
  Administered 2021-10-13 – 2021-10-14 (×3): 1 mg via INTRAVENOUS
  Filled 2021-10-13 (×3): qty 1

## 2021-10-13 MED ORDER — ACETAMINOPHEN 325 MG PO TABS
650.0000 mg | ORAL_TABLET | Freq: Four times a day (QID) | ORAL | Status: DC | PRN
  Administered 2021-10-14: 650 mg via ORAL
  Filled 2021-10-13: qty 2

## 2021-10-13 MED ORDER — ENOXAPARIN SODIUM 40 MG/0.4ML IJ SOSY: 40.0000 mg | PREFILLED_SYRINGE | INTRAMUSCULAR | Status: DC

## 2021-10-13 NOTE — Assessment & Plan Note (Signed)
-   We will place the patient on as needed IV labetalol and hydralazine. - We will resume her amlodipine and hold HCTZ given her acute kidney injury.

## 2021-10-13 NOTE — Assessment & Plan Note (Signed)
The patient will be hydrated with IV normal saline. - We will avoid nephrotoxins. - We will follow BMP.

## 2021-10-13 NOTE — Assessment & Plan Note (Addendum)
-   This is likely secondary to blood loss from excessive vaginal bleeding. - The patient will be typed and crossmatched and will be transfused units of packed red blood cells to get her close to her baseline and given her symptomatic anemia. - We will follow serial H&H and posttransfusion H&H.

## 2021-10-13 NOTE — Assessment & Plan Note (Signed)
-   We will continue Neurontin. ?

## 2021-10-13 NOTE — Assessment & Plan Note (Signed)
-   This likely the culprit for her acute blood loss anemia. - This is associated with fibroid uterus. - A GYN consultation will be obtained while she is here. - I discussed the case with Dr. Leafy Ro.

## 2021-10-13 NOTE — Assessment & Plan Note (Addendum)
-   This is mild based on her significant hyperglycemia and acidosis without ketosis mild acidosis. - The patient will be hydrated with IV normal saline with improvement in her blood pressure. - We will follow serial BMPs.

## 2021-10-13 NOTE — ED Provider Notes (Signed)
[facility] Provider Note    Event Date/Time   First MD Initiated Contact with Patient 10/13/21 1846     (approximate)   History   Hypertension   HPI { Jennifer [name] is a 41 y.o. female who comes in complaining of a headache and high blood pressure.  She says she is feeling short of breath when she exerts herself.  She feels like she did last time when she was anemic.  She is losing blood because she has a fibroid uterus and has required transfusion in the past.      Physical Exam   Triage Vital Signs: ED Triage Vitals  Enc Vitals Group     BP 10/13/21 1802 (!) 193/126     Pulse Rate 10/13/21 1802 (!) 121     Resp 10/13/21 1802 19     Temp 10/13/21 1802 98.7 F (37.1 C)     Temp Source 10/13/21 1802 Oral     SpO2 10/13/21 1802 98 %     Weight 10/13/21 1803 186 lb (84.4 kg)     Height 10/13/21 1852 '5\' 6"'$  (1.676 m)     Head Circumference --      Peak Flow --      Pain Score 10/13/21 1803 10     Pain Loc --      Pain Edu? --      Excl. in [place]? --     Most recent vital signs: Vitals:   10/13/21 2357 10/14/21 0047  BP: (!) 228/114 (!) 225/134  Pulse:    Resp: 12   Temp: 98.4 F (36.9 C)   SpO2:       General: Awake, no distress.  CV:  Good peripheral perfusion.  Heart regular rate and rhythm no audible murmur she is tachycardic Resp:  Normal effort.  Lungs are clear Abd:  No distention.  Soft and nontender    ED Results / Procedures / Treatments   Labs (all labs ordered are listed, but only abnormal results are displayed) Labs Reviewed  BASIC METABOLIC PANEL - Abnormal; Notable for the following components:      Result Value   Sodium 132 (*)    CO2 17 (*)    Glucose, Bld 479 (*)    Creatinine, Ser 1.53 (*)    Calcium 8.5 (*)    GFR, Estimated 43 (*)    All other components within normal limits  CBC - Abnormal; Notable for the following components:   RBC 3.77 (*)    Hemoglobin 6.8 (*)    HCT 25.7 (*)    MCV 68.2 (*)     MCH 18.0 (*)    MCHC 26.5 (*)    RDW 21.3 (*)    Platelets 523 (*)    All other components within normal limits  URINALYSIS, ROUTINE W REFLEX MICROSCOPIC - Abnormal; Notable for the following components:   Color, Urine STRAW (*)    APPearance CLEAR (*)    Glucose, UA >=500 (*)    All other components within normal limits  HEPATIC FUNCTION PANEL - Abnormal; Notable for the following components:   Albumin 3.3 (*)    All other components within normal limits  BLOOD GAS, VENOUS - Abnormal; Notable for the following components:   pCO2, Ven 38 (*)    Acid-base deficit 4.8 (*)    All other components within normal limits  GLUCOSE, CAPILLARY - Abnormal; Notable for the following components:   Glucose-Capillary 286 (*)  All other components within normal limits  CBG MONITORING, ED - Abnormal; Notable for the following components:   Glucose-Capillary 317 (*)    All other components within normal limits  BETA-HYDROXYBUTYRIC ACID  VOLATILES,BLD-ACETONE,ETHANOL,ISOPROP,METHANOL  BASIC METABOLIC PANEL  CBC  POC URINE PREG, ED  TYPE AND SCREEN  PREPARE RBC (CROSSMATCH)  TROPONIN I (HIGH SENSITIVITY)  TROPONIN I (HIGH SENSITIVITY)     EKG  EKG read interpreted by me shows sinus tach at a rate of 116 normal axis no acute ST-T wave changes   RADIOLOGY Chest x-ray read by radiology reviewed and interpreted by me shows no acute disease   PROCEDURES:  Critical Care performed:   Procedures   MEDICATIONS ORDERED IN ED: Medications  0.9 %  sodium chloride infusion (has no administration in time range)  famotidine (PEPCID) tablet 20 mg (has no administration in time range)  ferrous sulfate tablet 325 mg (325 mg Oral Given 10/13/21 2330)  gabapentin (NEURONTIN) capsule 300 mg (300 mg Oral Given 10/13/21 2329)  0.9 %  sodium chloride infusion ( Intravenous New Bag/Given 10/13/21 2201)  acetaminophen (TYLENOL) tablet 650 mg (has no administration in time range)    Or  acetaminophen  (TYLENOL) suppository 650 mg (has no administration in time range)  traZODone (DESYREL) tablet 25 mg (25 mg Oral Given 10/13/21 2348)  ondansetron (ZOFRAN) tablet 4 mg ( Oral See Alternative 10/14/21 0022)    Or  ondansetron (ZOFRAN) injection 4 mg (4 mg Intravenous Given 10/14/21 0022)  magnesium hydroxide (MILK OF MAGNESIA) suspension 30 mL (has no administration in time range)  insulin aspart (novoLOG) injection 60 Units (has no administration in time range)  insulin aspart (novoLOG) injection 0-20 Units (has no administration in time range)  insulin aspart (novoLOG) injection 0-5 Units ( Subcutaneous Not Given 10/13/21 2250)  HYDROmorphone (DILAUDID) injection 1 mg (1 mg Intravenous Given 10/13/21 2331)  labetalol (NORMODYNE) injection 20 mg (has no administration in time range)  hydrALAZINE (APRESOLINE) injection 10 mg (10 mg Intravenous Given 10/14/21 0047)  acetaminophen (TYLENOL) tablet 650 mg (650 mg Oral Given 10/13/21 1909)  labetalol (NORMODYNE) injection 5 mg (5 mg Intravenous Given 10/13/21 1910)  sodium chloride 0.9 % bolus 1,000 mL (0 mLs Intravenous Stopped 10/13/21 2155)  labetalol (NORMODYNE) injection 20 mg (20 mg Intravenous Given 10/13/21 1953)  insulin aspart (novoLOG) injection 5 Units (5 Units Intravenous Given 10/13/21 2157)     IMPRESSION / MDM / ASSESSMENT AND PLAN / ED COURSE  I reviewed the triage vital signs and the nursing notes. Patient with symptomatic anemia likely from her GYN problem.  She also has an elevated glucose of 479 and decreased bicarb and new AKI with a GFR nearly dropped from 60 or greater than 60-43.  We will get her in the hospital and give her transfusion and address her elevated blood sugar.  Additionally we will have to work some on her blood pressure which is also up.  I gave her 1 dose of labetalol which began to bring her blood pressure down.  Discussed with the hospitalist who then assumed care  Patient has several different problems including  severe hypertension and vaginal bleeding and elevated blood glucose.  Patient's presentation is most consistent with acute presentation with potential threat to life or bodily function.  The patient is on the cardiac monitor to evaluate for evidence of arrhythmia and/or significant heart rate changes.  None have been seen      FINAL CLINICAL IMPRESSION(S) / ED DIAGNOSES   Final  diagnoses:  Symptomatic anemia  Hypertension, unspecified type  Elevated glucose     Rx / DC Orders   ED Discharge Orders     None        Note:  This document was prepared using Dragon voice recognition software and may include unintentional dictation errors.   Nena Polio, MD 10/14/21 [phone]

## 2021-10-13 NOTE — H&P (Addendum)
[place]   PATIENT NAME: Chellsea Beckers    MR#:  [id number]  DATE OF BIRTH:  Apr 19, 1980  DATE OF ADMISSION:  10/13/2021  PRIMARY CARE PHYSICIAN: [facility] [place]   Patient is coming from: Home  REQUESTING/REFERRING PHYSICIAN: Conni Slipper, MD  CHIEF COMPLAINT:   Chief Complaint  Patient presents with   Hypertension    HISTORY OF PRESENT ILLNESS:  KAILEIA FLOW is a 41 y.o. female with medical history significant for type 1 diabetes mellitus and hypertension, who presented to the emergency room with acute onset of worsening dyspnea especially on exertion.  She has been having persistent vaginal bleeding from her fibroid uterus which has required blood transfusions in the past.  She admits to headache and lightheadedness without paresthesias or focal muscle weakness.  No nausea or vomiting or abdominal pain.  No dysuria, oliguria or hematuria or flank pain.  No cough or wheezing or hemoptysis.  No other bleeding diathesis.  No chest pain or palpitations.  ED Course: When she came to the ER, BP was 193/126 with heart rate of 121 with otherwise normal vital signs.  Labs revealed microcytic anemia with hemoglobin of 6.8 and hematocrit of 25.7 compared to 9.7/33.8 on 04/29/2021.  Blood glucose was 479 with a CO2 of 17 and sodium 132, creatinine 1.53 up from 0.95 on 3/27 and calcium of 8.5.  High sensitive troponin was 8.  Urinalysis showed more than 500 glucose EKG as reviewed by me : EKG showed sinus tachycardia with rate 116 with right atrial enlargement and Q waves anteroseptally. Imaging: Two-view chest x-ray showed no acute cardiopulmonary disease.  The patient was typed and crossmatched and will be transfused units of packed red blood cells.  She will be given 20 mg of IV labetalol as bolus 5 units of IV NovoLog.  She will be admitted to progressive unit bed for further evaluation and management. PAST MEDICAL HISTORY:   Past Medical History:  Diagnosis  Date   Diabetes mellitus without complication ([place])    Hypertension     PAST SURGICAL HISTORY:   Past Surgical History:  Procedure Laterality Date   CESAREAN SECTION     x2   none      SOCIAL HISTORY:   Social History   Tobacco Use   Smoking status: Every Day    Packs/day: 0.00    Types: Cigarettes   Smokeless tobacco: Never  Substance Use Topics   Alcohol use: No    FAMILY HISTORY:   Family History  Problem Relation Age of Onset   Diabetes Other    Hypertension Other     DRUG ALLERGIES:   Allergies  Allergen Reactions   Demerol [Meperidine] Hives, Itching and Nausea And Vomiting   Meloxicam    Morphine Other (See Comments)    Other Reaction: tolerates hydromorphone   Oxycodone Hives, Itching and Nausea And Vomiting   Toradol [Ketorolac Tromethamine] Hives, Itching and Nausea And Vomiting    REVIEW OF SYSTEMS:   ROS As per history of present illness. All pertinent systems were reviewed above. Constitutional, HEENT, cardiovascular, respiratory, GI, GU, musculoskeletal, neuro, psychiatric, endocrine, integumentary and hematologic systems were reviewed and are otherwise negative/unremarkable except for positive findings mentioned above in the HPI.   MEDICATIONS AT HOME:   Prior to Admission medications   Medication Sig Start Date End Date Taking? Authorizing Provider  famotidine (PEPCID) 20 MG tablet Take 20 mg by mouth 2 (two) times daily. 04/15/21  [provider]  ferrous sulfate 325 (65 FE) MG EC tablet Take 1 tablet (325 mg total) by mouth 2 (two) times daily. 04/17/21 04/17/22  Naaman Plummer, MD  gabapentin (NEURONTIN) 300 MG capsule Take 300 mg by mouth 3 (three) times daily. 04/15/21   [provider]  hydrochlorothiazide (HYDRODIURIL) 25 MG tablet Take 25 mg by mouth daily.    [provider]  ibuprofen (ADVIL) 800 MG tablet Take 1 tablet (800 mg total) by mouth every 8 (eight) hours as needed for moderate pain. 04/18/21    Jennye Boroughs, MD  LEVEMIR FLEXPEN 100 UNIT/ML FlexPen Inject 60 Units into the skin 3 (three) times daily. 04/15/21   [provider]  lisinopril (ZESTRIL) 40 MG tablet Take 40 mg by mouth daily. 04/15/21   [provider]  metFORMIN (GLUCOPHAGE) 1000 MG tablet Take 1 tablet (1,000 mg total) by mouth 2 (two) times daily with a meal. 10/25/15   Theodoro Grist, MD  NOVOLOG FLEXPEN 100 UNIT/ML FlexPen Inject 30 Units into the skin 3 (three) times daily with meals. 04/15/21   [provider]      VITAL SIGNS:  Blood pressure (!) 187/104, pulse 80, temperature 98.4 F (36.9 C), resp. rate 17, height '5\' 6"'$  (1.676 m), weight 82 kg, last menstrual period 10/04/2021, SpO2 100 %.  PHYSICAL EXAMINATION:  Physical Exam  GENERAL:  41 y.o.-year-old African-American female   patient lying in the bed with no acute distress.  EYES: Pupils equal, round, reactive to light and accommodation. No scleral icterus.  Positive pallor.    Extraocular muscles intact.  HEENT: Head atraumatic, normocephalic. Oropharynx and nasopharynx clear.  NECK:  Supple, no jugular venous distention. No thyroid enlargement, no tenderness.  LUNGS: Normal breath sounds bilaterally, no wheezing, rales,rhonchi or crepitation. No use of accessory muscles of respiration.  CARDIOVASCULAR: Regular rate and rhythm, S1, S2 normal. No murmurs, rubs, or gallops.  ABDOMEN: Soft, nondistended, nontender. Bowel sounds present. No organomegaly or mass.  EXTREMITIES: No pedal edema, cyanosis, or clubbing.  NEUROLOGIC: Cranial nerves II through XII are intact. Muscle strength 5/5 in all extremities. Sensation intact. Gait not checked.  PSYCHIATRIC: The patient is alert and oriented x 3.  Normal affect and good eye contact. SKIN: No obvious rash, lesion, or ulcer.   LABORATORY PANEL:   CBC Recent Labs  Lab 10/13/21 1812  WBC 9.0  HGB 6.8*  HCT 25.7*  PLT 523*    ------------------------------------------------------------------------------------------------------------------  Chemistries  Recent Labs  Lab 10/13/21 1812  NA 132*  K 3.8  CL 104  CO2 17*  GLUCOSE 479*  BUN 16  CREATININE 1.53*  CALCIUM 8.5*  AST 37  ALT 23  ALKPHOS 53  BILITOT 0.6   ------------------------------------------------------------------------------------------------------------------  Cardiac Enzymes No results for input(s): "TROPONINI" in the last 168 hours. ------------------------------------------------------------------------------------------------------------------  RADIOLOGY:  DG Chest 2 View  Result Date: 10/13/2021 CLINICAL DATA:  Dyspnea EXAM: CHEST - 2 VIEW COMPARISON:  Radiographs 04/17/2021 FINDINGS: No focal consolidation, pleural effusion, or pneumothorax. Normal cardiomediastinal silhouette. No acute osseous abnormality. IMPRESSION: No active cardiopulmonary disease. Electronically Signed   By: Placido Sou M.D.   On: 10/13/2021 19:37      IMPRESSION AND PLAN:  Assessment and Plan: * Acute blood loss anemia - This is likely secondary to blood loss from excessive vaginal bleeding. - The patient will be typed and crossmatched and will be transfused units of packed red blood cells to get her close to her baseline and given her symptomatic anemia. -  We will follow serial H&H and posttransfusion H&H.  Hypertensive urgency - We will place the patient on as needed IV labetalol and hydralazine. - We will resume her amlodipine and hold HCTZ given her acute kidney injury.  Menometrorrhagia - This likely the culprit for her acute blood loss anemia. - This is associated with fibroid uterus. - A GYN consultation will be obtained while she is here. - I discussed the case with Dr. Leafy Ro.  Diabetic ketoacidosis associated with type 1 diabetes mellitus ([place]) - This is mild based on her significant hyperglycemia and acidosis without ketosis  mild acidosis. - The patient will be hydrated with IV normal saline with improvement in her blood pressure. - We will follow serial BMPs.  AKI (acute kidney injury) ([place])   The patient will be hydrated with IV normal saline. - We will avoid nephrotoxins. - We will follow BMP.  Diabetic peripheral neuropathy (HCC) - We will continue Neurontin.   DVT prophylaxis: Lovenox.  Advanced Care Planning:  Code Status: full code.  Family Communication:  The plan of care was discussed in details with the patient (and family). I answered all questions. The patient agreed to proceed with the above mentioned plan. Further management will depend upon hospital course. Disposition Plan: Back to previous home environment Consults called: Gynecology All the records are reviewed and case discussed with ED provider.  Status is: Inpatient   At the time of the admission, it appears that the appropriate admission status for this patient is inpatient.  This is judged to be reasonable and necessary in order to provide the required intensity of service to ensure the patient's safety given the presenting symptoms, physical exam findings and initial radiographic and laboratory data in the context of comorbid conditions.  The patient requires inpatient status due to high intensity of service, high risk of further deterioration and high frequency of surveillance required.  I certify that at the time of admission, it is my clinical judgment that the patient will require inpatient hospital care extending more than 2 midnights.                            Dispo: The patient is from: Home              Anticipated d/c is to: Home              Patient currently is not medically stable to d/c.              Difficult to place patient: No  Christel Mormon M.D on 10/13/2021 at 11:30 PM  [facility]   From 7 PM-7 AM, contact night-coverage [email or web address]  CC: Primary care physician; [facility] [place]

## 2021-10-13 NOTE — ED Triage Notes (Signed)
Pt arrives with c/o HTN that has gotten worse over the last month. Per pt, she been taking BP meds as prescribed. Pt endorse SOB and headache.

## 2021-10-14 ENCOUNTER — Inpatient Hospital Stay: Payer: 59

## 2021-10-14 DIAGNOSIS — D62 Acute posthemorrhagic anemia: Secondary | ICD-10-CM | POA: Diagnosis not present

## 2021-10-14 LAB — CBC
HCT: 27.3 % — ABNORMAL LOW (ref 36.0–46.0)
Hemoglobin: 7.8 g/dL — ABNORMAL LOW (ref 12.0–15.0)
MCH: 20 pg — ABNORMAL LOW (ref 26.0–34.0)
MCHC: 28.6 g/dL — ABNORMAL LOW (ref 30.0–36.0)
MCV: 70 fL — ABNORMAL LOW (ref 80.0–100.0)
Platelets: 480 10*3/uL — ABNORMAL HIGH (ref 150–400)
RBC: 3.9 MIL/uL (ref 3.87–5.11)
RDW: 23.8 % — ABNORMAL HIGH (ref 11.5–15.5)
WBC: 9 10*3/uL (ref 4.0–10.5)
nRBC: 0 % (ref 0.0–0.2)

## 2021-10-14 LAB — GLUCOSE, CAPILLARY
Glucose-Capillary: 322 mg/dL — ABNORMAL HIGH (ref 70–99)
Glucose-Capillary: 195 mg/dL — ABNORMAL HIGH (ref 70–99)
Glucose-Capillary: 186 mg/dL — ABNORMAL HIGH (ref 70–99)
Glucose-Capillary: 316 mg/dL — ABNORMAL HIGH (ref 70–99)

## 2021-10-14 LAB — BASIC METABOLIC PANEL
Anion gap: 8 (ref 5–15)
BUN: 13 mg/dL (ref 6–20)
CO2: 21 mmol/L — ABNORMAL LOW (ref 22–32)
Calcium: 8 mg/dL — ABNORMAL LOW (ref 8.9–10.3)
Chloride: 109 mmol/L (ref 98–111)
Creatinine, Ser: 0.95 mg/dL (ref 0.44–1.00)
GFR, Estimated: >60 mL/min (ref >60)
Glucose, Bld: 316 mg/dL — ABNORMAL HIGH (ref 70–99)
Potassium: 3.3 mmol/L — ABNORMAL LOW (ref 3.5–5.1)
Sodium: 138 mmol/L (ref 135–145)

## 2021-10-14 LAB — HEMOGLOBIN A1C
Hgb A1c MFr Bld: 9.4 % — ABNORMAL HIGH (ref 4.8–5.6)
Mean Plasma Glucose: 223.08 mg/dL

## 2021-10-14 MED ORDER — HYDROCODONE-ACETAMINOPHEN 5-325 MG PO TABS
1.0000 | ORAL_TABLET | Freq: Four times a day (QID) | ORAL | Status: DC | PRN
  Administered 2021-10-14 – 2021-10-16 (×6): 1 via ORAL
  Filled 2021-10-14 (×6): qty 1

## 2021-10-14 MED ORDER — LISINOPRIL 20 MG PO TABS
40.0000 mg | ORAL_TABLET | Freq: Every day | ORAL | Status: DC
  Administered 2021-10-14 – 2021-10-16 (×3): 40 mg via ORAL
  Filled 2021-10-14 (×3): qty 2

## 2021-10-14 MED ORDER — AMLODIPINE BESYLATE 5 MG PO TABS: 5.0000 mg | ORAL_TABLET | Freq: Every day | ORAL | Status: DC

## 2021-10-14 MED ORDER — POTASSIUM CHLORIDE CRYS ER 20 MEQ PO TBCR
20.0000 meq | EXTENDED_RELEASE_TABLET | Freq: Once | ORAL | Status: AC
Start: 2021-10-14 — End: 2021-10-14
  Administered 2021-10-14: 20 meq via ORAL
  Filled 2021-10-14: qty 1

## 2021-10-14 MED ORDER — CLONIDINE HCL 0.1 MG PO TABS
0.2000 mg | ORAL_TABLET | ORAL | Status: AC
  Administered 2021-10-14: 0.2 mg via ORAL
  Filled 2021-10-14: qty 2

## 2021-10-14 MED ORDER — INSULIN GLARGINE-YFGN 100 UNIT/ML ~~LOC~~ SOLN
30.0000 [IU] | Freq: Every day | SUBCUTANEOUS | Status: DC
Start: 2021-10-14 — End: 2021-10-14

## 2021-10-14 MED ORDER — FAMOTIDINE 20 MG PO TABS: 20.0000 mg | ORAL_TABLET | Freq: Every day | ORAL | Status: DC

## 2021-10-14 MED ORDER — INSULIN GLARGINE-YFGN 100 UNIT/ML ~~LOC~~ SOLN
30.0000 [IU] | Freq: Every day | SUBCUTANEOUS | Status: DC
Start: 2021-10-14 — End: 2021-10-15
  Administered 2021-10-14: 30 [IU] via SUBCUTANEOUS
  Filled 2021-10-14: qty 0.3

## 2021-10-14 MED ORDER — DIPHENHYDRAMINE HCL 25 MG PO CAPS
25.0000 mg | ORAL_CAPSULE | Freq: Three times a day (TID) | ORAL | Status: DC | PRN
  Administered 2021-10-14: 25 mg via ORAL
  Filled 2021-10-14: qty 1

## 2021-10-14 MED ORDER — HYDRALAZINE HCL 25 MG PO TABS
25.0000 mg | ORAL_TABLET | Freq: Three times a day (TID) | ORAL | Status: DC
  Administered 2021-10-14 – 2021-10-16 (×7): 25 mg via ORAL
  Filled 2021-10-14 (×7): qty 1

## 2021-10-14 MED ORDER — CALCIUM CARBONATE ANTACID 500 MG PO CHEW
1.0000 | CHEWABLE_TABLET | Freq: Three times a day (TID) | ORAL | Status: DC | PRN
  Administered 2021-10-14: 200 mg via ORAL
  Filled 2021-10-14: qty 1

## 2021-10-14 MED ORDER — HYDROCHLOROTHIAZIDE 25 MG PO TABS
25.0000 mg | ORAL_TABLET | Freq: Every day | ORAL | Status: DC
  Administered 2021-10-14 – 2021-10-16 (×3): 25 mg via ORAL
  Filled 2021-10-14 (×3): qty 1

## 2021-10-14 MED ORDER — POTASSIUM CHLORIDE 20 MEQ PO PACK: 20.0000 meq | PACK | Freq: Once | ORAL | Status: DC

## 2021-10-14 MED ORDER — LABETALOL HCL 5 MG/ML IV SOLN
20.0000 mg | INTRAVENOUS | Status: DC | PRN
  Administered 2021-10-14 (×2): 20 mg via INTRAVENOUS
  Filled 2021-10-14 (×2): qty 4

## 2021-10-14 MED ORDER — HYDRALAZINE HCL 20 MG/ML IJ SOLN
10.0000 mg | Freq: Four times a day (QID) | INTRAMUSCULAR | Status: DC | PRN
  Administered 2021-10-14 (×2): 10 mg via INTRAVENOUS
  Filled 2021-10-14 (×2): qty 1

## 2021-10-14 MED ORDER — SODIUM CHLORIDE 0.9 % IV SOLN
300.0000 mg | INTRAVENOUS | Status: DC
  Administered 2021-10-14: 300 mg via INTRAVENOUS
  Filled 2021-10-14: qty 300

## 2021-10-14 MED ORDER — AMLODIPINE BESYLATE 10 MG PO TABS
10.0000 mg | ORAL_TABLET | Freq: Every day | ORAL | Status: DC
  Administered 2021-10-14 – 2021-10-16 (×3): 10 mg via ORAL
  Filled 2021-10-14 (×3): qty 1

## 2021-10-14 NOTE — Progress Notes (Signed)
Patient admitted to the unit and placed on the telemetry monitor. A skin assessment was completed by myself and Ria Comment  the charge nurse. No skin issues are noted at this time.

## 2021-10-14 NOTE — Inpatient Diabetes Management (Addendum)
Inpatient Diabetes Program Recommendations  AACE/ADA: New Consensus Statement on Inpatient Glycemic Control   Target Ranges:  Prepandial:   less than 140 mg/dL      Peak postprandial:   less than 180 mg/dL (1-2 hours)      Critically ill patients:  140 - 180 mg/dL    Latest Reference Range & Units 10/13/21 21:56 10/13/21 23:19 10/14/21 07:42  Glucose-Capillary 70 - 99 mg/dL 317 (H)  Novolog 5 units 286 (H) 322 (H)  Novolog 15 units @ 8:33  Novolog 60 units @ 9:50   Review of Glycemic Control  Diabetes history: DM2 Outpatient Diabetes medications: Novolog 70/30 60 units TID with meals, Novolin R 30 units TID with meals, Metformin 1000 mg BID Current orders for Inpatient glycemic control: Novolog 60 units TID with meals, Novolog 0-20 units TID with meals, Novolog 0-5 units QHS  Inpatient Diabetes Program Recommendations:    Insulin: Fasting glucose 322 mg/dl today and patient has received a total of Novolog 75 units this morning.  Concerned patient may experience hypoglycemia from getting large dose of Novolog this morning. Please discontinue Novolog 60 units TID with meals.   NOTE: Patient was last inpatient 04/17/21-04/18/21 and was ordered Levemir 50 units BID and Novolog sensitive correction scale; inpatient diabetes coordinator spoke with patient on 04/18/21 during that admission. Patient admitted on 10/13/21 with acute blood loss anemia, hypertensive urgency, menometrorrhagia, mild DKA (treated with SQ insulin), and AKI. Will plan to talk with patient today.  Addendum 10/14/21'@11'$ :20-Spoke with patient about diabetes and home regimen for diabetes control. Patient reports being followed by [facility] for diabetes management and currently taking Novolog 70/30 60 units TID with meals, Novolin R 30 units TID with meals, and Metformin 1000 mg BID as an outpatient for diabetes control. Patient reports taking DM medications as prescribed and reports that she takes same dosages of insulin  even if not eating. Patient reports that if she does not eat and takes the insulin, she usually has hypoglycemia. Patient reports that her glucose is usually in the low to mid 100's mg/dl at home. Patient reports that she is giving insulin in her abdomen in same area. Examined abdomen and patient has several hardened areas in abdomen. Advised patient to stay away from those hardened areas and be sure to rotate injection sites.  Patient states that she had clear liquid breakfast this morning but she threw it all back up. Patient states she can recognize hypoglycemia (gets sweaty and shaky). Discussed that she was given Novolog 75 units this morning and if she started to feel like she was having hypoglycemia to call nursing staff so they can check her glucose and treat if needed.  Patient states that she does not recall her last A1C but the provider at the clinic told her she was doing a good job with her DM as a result of her A1C.  Encouraged patient to follow up with PCP regarding DM management.  Patient verbalized understanding of information discussed and reports no further questions at this time related to diabetes.  Thanks, Barnie Alderman, RN, MSN, [place] Diabetes Coordinator Inpatient Diabetes Program [phone] (Team Pager from 8am to [place])

## 2021-10-14 NOTE — Consult Note (Addendum)
Consult History and Physical   SERVICE: Gynecology [facility] unassigned   Patient Name: Jennifer [name] Patient MRN:   [id number]  CC: SOB ,  Consult placed for anemia and menorrhagia  HPI: Jennifer [name] is a 41 y.o. No obstetric history on file. with a long h/o heavy menses . Known fibroid uterus .  U/s 3/23 showed multiple fibroids .  S/P LTCS x 2 ( by me) and BTL  She has required blood transfusions in past   Review of Systems: positives in bold GEN:   fevers, chills, weight changes, appetite changes, fatigue, night sweats HEENT:  HA, vision changes, hearing loss, congestion, rhinorrhea, sinus pressure, dysphagia CV:   CP, palpitations PULM:  SOB, cough GI:  abd pain, N/V/D/C GU:  dysuria, urgency, frequency MSK:  arthralgias, myalgias, back pain, swelling SKIN:  rashes, color changes, pallor NEURO:  numbness, weakness, tingling, seizures, dizziness, tremors PSYCH:  depression, anxiety, behavioral problems, confusion  HEME/LYMPH:  easy bruising or bleeding ENDO:  heat/cold intolerance  Past Obstetrical History: OB History   No obstetric history on file.     Past Gynecologic History: Patient's last menstrual period was 10/04/2021. Menstrual frequency Q monthly  wks lasting weeks   Past Medical History: Past Medical History:  Diagnosis Date   Diabetes mellitus without complication ([place])    Hypertension     Past Surgical History:   Past Surgical History:  Procedure Laterality Date   CESAREAN SECTION     x2   none      Family History:  family history includes Diabetes in an other family member; Hypertension in an other family member.  Social History:  Social History   Socioeconomic History   Marital status: Married    Spouse name: Not on file   Number of children: Not on file   Years of education: Not on file   Highest education level: Not on file  Occupational History   Not on file  Tobacco Use   Smoking status: Every Day    Packs/day: 0.00    Types:  Cigarettes   Smokeless tobacco: Never  Substance and Sexual Activity   Alcohol use: No   Drug use: No   Sexual activity: Not on file  Other Topics Concern   Not on file  Social History Narrative   Not on file   Social Determinants of Health   Financial Resource Strain: Not on file  Food Insecurity: No Food Insecurity (10/13/2021)   Hunger Vital Sign    Worried About Running Out of Food in the Last Year: Never true    Ran Out of Food in the Last Year: Never true  Transportation Needs: No Transportation Needs (10/13/2021)   PRAPARE - Hydrologist (Medical): No    Lack of Transportation (Non-Medical): No  Physical Activity: Not on file  Stress: Not on file  Social Connections: Not on file  Intimate Partner Violence: Not At Risk (10/13/2021)   Humiliation, Afraid, Rape, and Kick questionnaire    Fear of Current or Ex-Partner: No    Emotionally Abused: No    Physically Abused: No    Sexually Abused: No    Home Medications:  Medications reconciled in EPIC  No current facility-administered medications on file prior to encounter.   Current Outpatient Medications on File Prior to Encounter  Medication Sig Dispense Refill   amLODipine (NORVASC) 5 MG tablet Take 1 tablet by mouth daily.     cyclobenzaprine (FLEXERIL) 10 MG tablet Take  10 mg by mouth 3 (three) times daily.     ferrous sulfate 325 (65 FE) MG EC tablet Take 1 tablet (325 mg total) by mouth 2 (two) times daily. 60 tablet 3   gabapentin (NEURONTIN) 300 MG capsule Take 600 mg by mouth 3 (three) times daily.     hydrochlorothiazide (HYDRODIURIL) 25 MG tablet Take 25 mg by mouth daily.     insulin aspart protamine - aspart (NOVOLOG 70/30 FLEXPEN) (70-30) 100 UNIT/ML FlexPen Inject 60 Units into the skin 3 (three) times daily before meals.     insulin regular (NOVOLIN R) 100 units/mL injection Inject 30 Units into the skin 3 (three) times daily before meals.     lisinopril (ZESTRIL) 40 MG tablet  Take 40 mg by mouth daily.     metFORMIN (GLUCOPHAGE) 1000 MG tablet Take 1 tablet (1,000 mg total) by mouth 2 (two) times daily with a meal. 60 tablet 6   famotidine (PEPCID) 20 MG tablet Take 20 mg by mouth 2 (two) times daily. (Patient not taking: Reported on 10/13/2021)      Allergies:  Allergies  Allergen Reactions   Demerol [Meperidine] Hives, Itching and Nausea And Vomiting   Meloxicam    Morphine Other (See Comments)    Other Reaction: tolerates hydromorphone   Oxycodone Hives, Itching and Nausea And Vomiting   Toradol [Ketorolac Tromethamine] Hives, Itching and Nausea And Vomiting    Physical Exam:  Temp:  [97.9 F (36.6 C)-98.7 F (37.1 C)] 97.9 F (36.6 C) (09/11 1137) Pulse Rate:  [76-121] 87 (09/11 1137) Resp:  [12-19] 18 (09/11 1137) BP: (146-232)/(89-138) 146/95 (09/11 1137) SpO2:  [93 %-100 %] 100 % (09/11 1137) Weight:  [82 kg-84.4 kg] 82 kg (09/10 2232)   General Appearance:  Well developed, well nourished, no acute distress, alert and oriented x3 HEENT:  Normocephalic atraumatic, extraocular movements intact, moist mucous membranes Cardiovascular:  Normal S1/S2, regular rate and rhythm, no murmurs Pulmonary:  clear to auscultation, no wheezes, rales or rhonchi, symmetric air entry, good air exchange Abdomen:   fibroid uterus 22 week in size Bowel sounds present, soft, nontender, nondistended, no abnormal masses, no epigastric pain Extremities:  Full range of motion, no pedal edema, 2+ distal pulses, no tenderness Skin:  normal coloration and turgor, no rashes, no suspicious skin lesions noted  Neurologic:  Cranial nerves 2-12 grossly intact, normal muscle tone, strength 5/5 all four extremities Psychiatric:  Normal mood and affect, appropriate, no AH/VH Pelvic:  deferred   Labs/Studies:   CBC and Coags:  Lab Results  Component Value Date   WBC 9.0 10/14/2021   NEUTOPHILPCT 73% 10/06/2015   EOSPCT 2% 10/06/2015   BASOPCT 1% 10/06/2015   LYMPHOPCT 18%  10/06/2015   HGB 7.8 (L) 10/14/2021   HCT 27.3 (L) 10/14/2021   MCV 70.0 (L) 10/14/2021   PLT 480 (H) 10/14/2021   CMP:  Lab Results  Component Value Date   NA 138 10/14/2021   K 3.3 (L) 10/14/2021   CL 109 10/14/2021   CO2 21 (L) 10/14/2021   BUN 13 10/14/2021   CREATININE 0.95 10/14/2021   CREATININE 1.53 (H) 10/13/2021   CREATININE 0.95 04/29/2021   PROT 7.5 10/13/2021   BILITOT 0.6 10/13/2021   BILIDIR <0.1 10/13/2021   ALT 23 10/13/2021   AST 37 10/13/2021   ALKPHOS 53 10/13/2021    Other Imaging: DG Chest 2 View  Result Date: 10/13/2021 CLINICAL DATA:  Dyspnea EXAM: CHEST - 2 VIEW COMPARISON:  Radiographs 04/17/2021 FINDINGS:  No focal consolidation, pleural effusion, or pneumothorax. Normal cardiomediastinal silhouette. No acute osseous abnormality. IMPRESSION: No active cardiopulmonary disease. Electronically Signed   By: Placido Sou M.D.   On: 10/13/2021 19:37     Assessment / Plan:   Jennifer [name] is a 41 y.o. No obstetric history on file. who presents with   1. HTN crisis, AKI , uncontrolled DM and menorrhagia with iron deficiency anemia  GYN issue fibroid uterus with excessive bleeding  causing Anemia  Recommend Abdominal u/s to evaluate size of fibroids  Venofer iron transfusion 300 mg IV while in hospital . Additional blood transfusion to get Hct > 30% She needs to get HTN and Dm undercontrol before performing TAH . She will follow up with me in office at [facility] after medical issues stable .   Lysteda '650mg'$  TID x 5 days  with heavy bleeding until surgery can be done  Thank you for the opportunity to be involved with this pt's care.

## 2021-10-14 NOTE — Progress Notes (Signed)
[place] at [place] NAME: Jennifer [name]    MR#:  [id number]  DATE OF BIRTH:  [birth year]  SUBJECTIVE:   Patient continues to have her.. She is headed for last 10 to 12 days on and off varies every day. Blood pressure much improved after change in medicines. Sugars bit on the higher side. Discussed new diabetic regimen with patient.   VITALS:  Blood pressure (!) 146/95, pulse 87, temperature 97.9 F (36.6 C), temperature source Oral, resp. rate 18, height '5\' 6"'$  (1.676 m), weight 82 kg, last menstrual period 10/04/2021, SpO2 100 %.  PHYSICAL EXAMINATION:   GENERAL:  41 y.o.-year-old patient lying in the bed with no acute distress. Pallor+ LUNGS: Normal breath sounds bilaterally, no wheezing, rales, rhonchi.  CARDIOVASCULAR: S1, S2 normal. No murmurs, rubs, or gallops.  ABDOMEN: Soft, nontender, nondistended. Bowel sounds present.  EXTREMITIES: No  edema b/l.    NEUROLOGIC: nonfocal  patient is alert and awake SKIN: No obvious rash, lesion, or ulcer.   LABORATORY PANEL:  CBC Recent Labs  Lab 10/14/21 0407  WBC 9.0  HGB 7.8*  HCT 27.3*  PLT 480*    Chemistries  Recent Labs  Lab 10/13/21 1812 10/14/21 0407  NA 132* 138  K 3.8 3.3*  CL 104 109  CO2 17* 21*  GLUCOSE 479* 316*  BUN 16 13  CREATININE 1.53* 0.95  CALCIUM 8.5* 8.0*  AST 37  --   ALT 23  --   ALKPHOS 53  --   BILITOT 0.6  --    Cardiac Enzymes No results for input(s): "TROPONINI" in the last 168 hours. RADIOLOGY:  US PELVIS (TRANSABDOMINAL ONLY)  Result Date: 10/14/2021 CLINICAL DATA:  Uterine fibroids.  Vaginal bleeding. EXAM: TRANSABDOMINAL ULTRASOUND OF PELVIS TECHNIQUE: Transabdominal ultrasound examination of the pelvis was performed including evaluation of the uterus, ovaries, adnexal regions, and pelvic cul-de-sac. COMPARISON:  Pelvic ultrasound 04/17/2021. CT abdomen and pelvis 09/23/2016. FINDINGS: The patient declined transvaginal examination which  limits complete assessment. Uterus Measurements: 14.1 x 8.3 x 9.5 cm = volume: 581 mL. Multiple uterine fibroids are again seen, including a 5.6 x 5.8 x 6.7 cm fundal fibroid, 5.9 x 5.5 x 5.3 cm posterior uterine fibroid, and 10.2 x 9.5 x 10.2 cm exophytic fibroid extending laterally from the fundus on the right. The longest dimensions of each of these fibroids are within 0.5 cm of those on the prior ultrasound. Endometrium Thickness: Estimated at 10 mm but poorly visualized due to the fibroids and lack of transvaginal imaging. Right ovary Measurements: 6.5 x 5.4 x 6.6 cm = volume: 121 mL. Generalized enlarged appearance with multiple peripherally located follicles. Left ovary Measurements: 5.4 x 4.5 x 5.6 cm = volume: 73 mL. 5.2 x 4.8 x 4.5 cm cyst, possibly containing minimal internal debris/blood products versus artifact. Other findings:  No abnormal free fluid. IMPRESSION: 1. Chronically enlarged uterus containing multiple fibroids, overall similar in size to the prior ultrasound. 2. Suboptimal visualization of the endometrium. 3. Enlarged right ovary with multiple peripherally located follicles. 4. 5.2 cm left ovarian cyst. Given size and possible internal debris/blood products, a follow-up ultrasound is recommended in 6-12 weeks to ensure resolution. Electronically Signed   By: Logan Bores M.D.   On: 10/14/2021 14:53   DG Chest 2 View  Result Date: 10/13/2021 CLINICAL DATA:  Dyspnea EXAM: CHEST - 2 VIEW COMPARISON:  Radiographs 04/17/2021 FINDINGS: No focal consolidation, pleural effusion, or pneumothorax. Normal cardiomediastinal silhouette. No acute osseous  abnormality. IMPRESSION: No active cardiopulmonary disease. Electronically Signed   By: Placido Sou M.D.   On: 10/13/2021 19:37    Assessment and Plan Jennifer [name] is a 41 y.o. female with medical history significant for type 1 diabetes mellitus and hypertension, who presented to the emergency room with acute onset of worsening dyspnea  especially on exertion.  She has been having persistent vaginal bleeding from her fibroid uterus which has required blood transfusions in the past.  She admits to headache and lightheadedness without paresthesias or focal muscle weakness.   Acute on chronic blood loss anemia with chronic iron deficiency anemia secondary to menorrhagia due to known history of chronic uterine fibroids -- patient came in with hemoglobin of 6.8-- one unit blood transfusion-- 7.8 -- seen by gynecologist Dr. Ouida Sills-- recommends IV iron, oral iron, optimize hypertension and diabetes, follow-up outpatient and consider total abdominal hysterectomy. --lysteda 650 mg tid x days recommended --ultrasound pelvis to show chronic fibroids   uncontrolled hypertension -- continue hydrochlorothiazide, lisinopril -- added amlodipine and hydralazine -- monitor blood pressure closely  Type II diabetes uncontrolled, insulin-dependent -last A1c March 2023 was 7.2- --at home patient is on NovoLog 60 units three times a day and metformin Excel thousand milligrams daily -- discussed with diabetes coordinator will start patient on long-acting insulin and consider adding PO metformin at discharge along with sliding scale -- start on Semglee 30 units tonite  Hypokalemia -- replace potassium  Acute renal failure suspect dehydration with uncontrolled sugars -- received IV fluids -- creatinine back to normal  Procedures: Family communication : none Consults : OB/GYN CODE STATUS: full DVT Prophylaxis :scd Level of care: Progressive Status is: Inpatient Remains inpatient appropriate because: uncontrolled hypertension and blood sugars. Getting blood transfusion for menorrhagia.    TOTAL TIME TAKING CARE OF THIS PATIENT: 35 minutes.  >50% time spent on counselling and coordination of care  Note: This dictation was prepared with Dragon dictation along with smaller phrase technology. Any transcriptional errors that result from  this process are unintentional.  Fritzi Mandes M.D    [facility]   CC: Primary care physician; Center, [place]

## 2021-10-15 DIAGNOSIS — D62 Acute posthemorrhagic anemia: Secondary | ICD-10-CM | POA: Diagnosis not present

## 2021-10-15 DIAGNOSIS — D649 Anemia, unspecified: Secondary | ICD-10-CM | POA: Diagnosis present

## 2021-10-15 LAB — GLUCOSE, CAPILLARY
Glucose-Capillary: 263 mg/dL — ABNORMAL HIGH (ref 70–99)
Glucose-Capillary: 304 mg/dL — ABNORMAL HIGH (ref 70–99)
Glucose-Capillary: 274 mg/dL — ABNORMAL HIGH (ref 70–99)
Glucose-Capillary: 231 mg/dL — ABNORMAL HIGH (ref 70–99)

## 2021-10-15 LAB — HEMOGLOBIN: Hemoglobin: 7.7 g/dL — ABNORMAL LOW (ref 12.0–15.0)

## 2021-10-15 LAB — VOLATILES,BLD-ACETONE,ETHANOL,ISOPROP,METHANOL
Acetone, blood: <0.01 g/dL (ref 0.000–0.010)
Ethanol, blood: <0.01 g/dL (ref 0.000–0.010)
Isopropanol, blood: <0.01 g/dL (ref 0.000–0.010)
Methanol, blood: <0.01 g/dL (ref 0.000–0.010)

## 2021-10-15 LAB — PREPARE RBC (CROSSMATCH)

## 2021-10-15 MED ORDER — BISACODYL 5 MG PO TBEC
10.0000 mg | DELAYED_RELEASE_TABLET | Freq: Every day | ORAL | Status: DC
  Administered 2021-10-15 – 2021-10-16 (×2): 10 mg via ORAL
  Filled 2021-10-15 (×3): qty 2

## 2021-10-15 MED ORDER — INSULIN GLARGINE-YFGN 100 UNIT/ML ~~LOC~~ SOLN
30.0000 [IU] | Freq: Two times a day (BID) | SUBCUTANEOUS | Status: DC
Start: 2021-10-15 — End: 2021-10-16
  Administered 2021-10-15 – 2021-10-16 (×3): 30 [IU] via SUBCUTANEOUS
  Filled 2021-10-15 (×4): qty 0.3

## 2021-10-15 MED ORDER — SODIUM CHLORIDE 0.9% IV SOLUTION: Freq: Once | INTRAVENOUS | Status: AC

## 2021-10-15 NOTE — Inpatient Diabetes Management (Signed)
Inpatient Diabetes Program Recommendations  AACE/ADA: New Consensus Statement on Inpatient Glycemic Control (2015)  Target Ranges:  Prepandial:   less than 140 mg/dL      Peak postprandial:   less than 180 mg/dL (1-2 hours)      Critically ill patients:  140 - 180 mg/dL   Lab Results  Component Value Date   GLUCAP 263 (H) 10/15/2021   HGBA1C 9.4 (H) 10/14/2021    Review of Glycemic Control  Latest Reference Range & Units 10/14/21 16:09 10/14/21 20:13 10/15/21 07:40  Glucose-Capillary 70 - 99 mg/dL 186 (H) 316 (H) 263 (H)  (H): Data is abnormally high Diabetes history: DM2 Outpatient Diabetes medications: Novolog 70/30 60 units TID with meals, Novolin R 30 units TID with meals, Metformin 1000 mg BID Current orders for Inpatient glycemic control: Semglee 30 units QHS, Novolog 0-20 units TID with meals, Novolog 0-5 units QHS   Inpatient Diabetes Program Recommendations:    Consider adding Novolog 6 units TID (assuming patient consuming >50% of meals) and increasing Semglee to 30 units BID.   Thanks, Bronson Curb, MSN, RNC-OB Diabetes Coordinator [phone] (8a-5p)

## 2021-10-15 NOTE — Progress Notes (Signed)
[place] at [place] NAME: Jennifer [name]    MR#:  [id number]  DATE OF BIRTH:  [birth year]  SUBJECTIVE:   Patient overall feels a whole lot better. Sugars of blood pressure much under control. No headache. Eating better. Her menstrual cycles have decreased in intensity  VITALS:  Blood pressure (!) 148/88, pulse 95, temperature 98.2 F (36.8 C), resp. rate 18, height '5\' 6"'$  (1.676 m), weight 82 kg, last menstrual period 10/04/2021, SpO2 97 %.  PHYSICAL EXAMINATION:   GENERAL:  41 y.o.-year-old patient lying in the bed with no acute distress. Pallor+ LUNGS: Normal breath sounds bilaterally, no wheezing, rales, rhonchi.  CARDIOVASCULAR: S1, S2 normal. No murmurs, rubs, or gallops.  ABDOMEN: Soft, nontender, nondistended. Bowel sounds present.  EXTREMITIES: No  edema b/l.    NEUROLOGIC: nonfocal  patient is alert and awake SKIN: No obvious rash, lesion, or ulcer.   LABORATORY PANEL:  CBC Recent Labs  Lab 10/14/21 0407 10/15/21 1438  WBC 9.0  --   HGB 7.8* 7.7*  HCT 27.3*  --   PLT 480*  --      Chemistries  Recent Labs  Lab 10/13/21 1812 10/14/21 0407  NA 132* 138  K 3.8 3.3*  CL 104 109  CO2 17* 21*  GLUCOSE 479* 316*  BUN 16 13  CREATININE 1.53* 0.95  CALCIUM 8.5* 8.0*  AST 37  --   ALT 23  --   ALKPHOS 53  --   BILITOT 0.6  --     Cardiac Enzymes No results for input(s): "TROPONINI" in the last 168 hours. RADIOLOGY:  US PELVIS (TRANSABDOMINAL ONLY)  Result Date: 10/14/2021 CLINICAL DATA:  Uterine fibroids.  Vaginal bleeding. EXAM: TRANSABDOMINAL ULTRASOUND OF PELVIS TECHNIQUE: Transabdominal ultrasound examination of the pelvis was performed including evaluation of the uterus, ovaries, adnexal regions, and pelvic cul-de-sac. COMPARISON:  Pelvic ultrasound 04/17/2021. CT abdomen and pelvis 09/23/2016. FINDINGS: The patient declined transvaginal examination which limits complete assessment. Uterus Measurements: 14.1 x  8.3 x 9.5 cm = volume: 581 mL. Multiple uterine fibroids are again seen, including a 5.6 x 5.8 x 6.7 cm fundal fibroid, 5.9 x 5.5 x 5.3 cm posterior uterine fibroid, and 10.2 x 9.5 x 10.2 cm exophytic fibroid extending laterally from the fundus on the right. The longest dimensions of each of these fibroids are within 0.5 cm of those on the prior ultrasound. Endometrium Thickness: Estimated at 10 mm but poorly visualized due to the fibroids and lack of transvaginal imaging. Right ovary Measurements: 6.5 x 5.4 x 6.6 cm = volume: 121 mL. Generalized enlarged appearance with multiple peripherally located follicles. Left ovary Measurements: 5.4 x 4.5 x 5.6 cm = volume: 73 mL. 5.2 x 4.8 x 4.5 cm cyst, possibly containing minimal internal debris/blood products versus artifact. Other findings:  No abnormal free fluid. IMPRESSION: 1. Chronically enlarged uterus containing multiple fibroids, overall similar in size to the prior ultrasound. 2. Suboptimal visualization of the endometrium. 3. Enlarged right ovary with multiple peripherally located follicles. 4. 5.2 cm left ovarian cyst. Given size and possible internal debris/blood products, a follow-up ultrasound is recommended in 6-12 weeks to ensure resolution. Electronically Signed   By: Logan Bores M.D.   On: 10/14/2021 14:53   DG Chest 2 View  Result Date: 10/13/2021 CLINICAL DATA:  Dyspnea EXAM: CHEST - 2 VIEW COMPARISON:  Radiographs 04/17/2021 FINDINGS: No focal consolidation, pleural effusion, or pneumothorax. Normal cardiomediastinal silhouette. No acute osseous abnormality. IMPRESSION: No active cardiopulmonary  disease. Electronically Signed   By: Placido Sou M.D.   On: 10/13/2021 19:37    Assessment and Plan Jennifer [name] is a 41 y.o. female with medical history significant for type 1 diabetes mellitus and hypertension, who presented to the emergency room with acute onset of worsening dyspnea especially on exertion.  She has been having persistent  vaginal bleeding from her fibroid uterus which has required blood transfusions in the past.  She admits to headache and lightheadedness without paresthesias or focal muscle weakness.   Acute on chronic blood loss anemia with chronic iron deficiency anemia secondary to menorrhagia due to known history of chronic uterine fibroids -- patient came in with hemoglobin of 6.8-- one unit blood transfusion-- 7.8--7.7--2nd unit BT today -- seen by gynecologist Dr. Ouida Sills-- recommends IV iron, oral iron, optimize hypertension and diabetes, follow-up outpatient and consider total abdominal hysterectomy.-- Patient has follow-up appointment on September 21 --lysteda 650 mg tid x days recommended by GYN if needed for heavy menstrual cycles --ultrasound pelvis shows chronic fibroids   uncontrolled hypertension -- continue hydrochlorothiazide, lisinopril -- added amlodipine and hydralazine -- blood pressure stable on current regimen. Please prescribe these meds at discharge  Type II diabetes uncontrolled, insulin-dependent -last A1c March 2023 was 7.2- --at home patient is on NovoLog 60 units three times a day and metformin Excel thousand milligrams daily -- discussed with diabetes coordinator  -- start on NovoLog 70/30 30 units BID at discharge, NovoLog six units TID and resume metformin. -- Please prescribe following insulin at discharge per diabetes coordinator recommendation  novolog 70/30- [phone], novolin Regular # [id number], Metformin 1000 mg BID, insulin pen needles [phone] -- patient advised to establish care with endocrinology as outpatient  Hypokalemia -- replace potassium  Acute renal failure suspect dehydration with uncontrolled sugars -- received IV fluids -- creatinine back to normal  Procedures: Family communication : none Consults : OB/GYN CODE STATUS: full DVT Prophylaxis :scd Level of care: Progressive Status is: Inpatient Remains inpatient appropriate because: uncontrolled  hypertension and blood sugars. Getting blood transfusion for menorrhagia.  If remains stable patient will discharged tomorrow (wed) with outpatient follow-up GYN and PCP    TOTAL TIME TAKING CARE OF THIS PATIENT: 35 minutes.  >50% time spent on counselling and coordination of care  Note: This dictation was prepared with Dragon dictation along with smaller phrase technology. Any transcriptional errors that result from this process are unintentional.  Fritzi Mandes M.D    [facility]   CC: Primary care physician; [facility], [place]

## 2021-10-16 ENCOUNTER — Other Ambulatory Visit: Payer: Self-pay | Admitting: Obstetrics and Gynecology

## 2021-10-16 DIAGNOSIS — D500 Iron deficiency anemia secondary to blood loss (chronic): Secondary | ICD-10-CM

## 2021-10-16 DIAGNOSIS — D62 Acute posthemorrhagic anemia: Secondary | ICD-10-CM | POA: Diagnosis not present

## 2021-10-16 LAB — BPAM RBC
Blood Product Expiration Date: 202309272359
Blood Product Expiration Date: 202310042359
ISSUE DATE / TIME: 202309102241
ISSUE DATE / TIME: 202309121716
Unit Type and Rh: 7300
Unit Type and Rh: 7300

## 2021-10-16 LAB — TYPE AND SCREEN
ABO/RH(D): B POS
Antibody Screen: NEGATIVE
Unit division: 0
Unit division: 0

## 2021-10-16 LAB — GLUCOSE, CAPILLARY
Glucose-Capillary: 247 mg/dL — ABNORMAL HIGH (ref 70–99)
Glucose-Capillary: 277 mg/dL — ABNORMAL HIGH (ref 70–99)

## 2021-10-16 LAB — HEMOGLOBIN AND HEMATOCRIT, BLOOD
HCT: 32.8 % — ABNORMAL LOW (ref 36.0–46.0)
Hemoglobin: 9.6 g/dL — ABNORMAL LOW (ref 12.0–15.0)

## 2021-10-16 LAB — FOLATE: Folate: 4.9 ng/mL — ABNORMAL LOW (ref >5.9)

## 2021-10-16 LAB — IRON AND TIBC
Iron: 55 ug/dL (ref 28–170)
Saturation Ratios: 12 % (ref 10.4–31.8)
TIBC: 455 ug/dL — ABNORMAL HIGH (ref 250–450)
UIBC: 400 ug/dL

## 2021-10-16 LAB — VITAMIN B12: Vitamin B-12: 259 pg/mL (ref 180–914)

## 2021-10-16 MED ORDER — HYDROCODONE-ACETAMINOPHEN 5-325 MG PO TABS: 1.0000 | ORAL_TABLET | Freq: Four times a day (QID) | ORAL | 0 refills | Status: AC | PRN

## 2021-10-16 MED ORDER — METFORMIN HCL 1000 MG PO TABS: 1000.0000 mg | ORAL_TABLET | Freq: Two times a day (BID) | ORAL | 2 refills | Status: DC

## 2021-10-16 MED ORDER — AMLODIPINE BESYLATE 10 MG PO TABS: 10.0000 mg | ORAL_TABLET | Freq: Every day | ORAL | 2 refills | Status: DC

## 2021-10-16 MED ORDER — FOLIC ACID 1 MG PO TABS: 1.0000 mg | ORAL_TABLET | Freq: Every day | ORAL | Status: DC

## 2021-10-16 MED ORDER — HYDRALAZINE HCL 25 MG PO TABS: 25.0000 mg | ORAL_TABLET | Freq: Three times a day (TID) | ORAL | 2 refills | Status: DC

## 2021-10-16 MED ORDER — TRANEXAMIC ACID 650 MG PO TABS: 650.0000 mg | ORAL_TABLET | Freq: Three times a day (TID) | ORAL | 2 refills | Status: AC

## 2021-10-16 MED ORDER — HYDROMORPHONE HCL 1 MG/ML IJ SOLN
1.0000 mg | INTRAMUSCULAR | Status: DC | PRN
  Administered 2021-10-16: 1 mg via INTRAVENOUS
  Filled 2021-10-16: qty 1

## 2021-10-16 MED ORDER — "PEN NEEDLES 3/16"" 31G X 5 MM MISC": 1.0000 [IU] | Freq: Two times a day (BID) | 2 refills | Status: DC

## 2021-10-16 MED ORDER — NOVOLOG 70/30 FLEXPEN RELION (70-30) 100 UNIT/ML ~~LOC~~ SUPN: 45.0000 [IU] | PEN_INJECTOR | Freq: Two times a day (BID) | SUBCUTANEOUS | 2 refills | Status: DC

## 2021-10-16 MED ORDER — SODIUM CHLORIDE 0.9 % IV SOLN
300.0000 mg | INTRAVENOUS | Status: AC
  Filled 2021-10-16 (×2): qty 15

## 2021-10-16 NOTE — Discharge Summary (Signed)
Physician Discharge Summary   Jennifer [name]  female DOB: [birth year]  EXB:[id number]  PCP: [facility] [place] date: 10/13/2021 Discharge date: 10/16/2021  Admitted From: home Disposition:  home CODE STATUS: Full code  Discharge Instructions     Ambulatory referral to Endocrinology   Complete by: As directed    Discharge instructions   Complete by: As directed    If you have heavy menstrual bleeding, take Lysteda 1 tablet (650 mg total) by mouth 3 (three) times daily for 5 days.   Follow up with ObGyn as outpatient to consider surgery.  For your blood pressure control, we have increased your amlodipine and added hydralazine.  Please take Novolog 70/30 insulin 45 units twice daily.  Stop taking insulin aspart.   Please establish with endocrinology as outpatient.   Dr. Enzo Bi [facility] Course:  For full details, please see H&P, progress notes, consult notes and ancillary notes.  Briefly,  Jennifer [name] is a 41 y.o. female with medical history significant for type 1 diabetes mellitus and hypertension, who presented to the emergency room with acute onset of worsening dyspnea especially on exertion.   She has been having persistent vaginal bleeding from her fibroid uterus which has required blood transfusions in the past.     Acute on chronic blood loss anemia with chronic iron deficiency anemia secondary to menorrhagia due to known history of chronic uterine fibroids -- patient came in with hemoglobin of 6.8.  Received 2u pRBC total, and IV iron. --ultrasound pelvis shows chronic fibroids. --pt was seen by Dr. Ouida Sills who is her regular outpatient ObGyn.  Dr. Ouida Sills recommended optimizing hypertension and diabetes control and follow-up outpatient to consider total hysterectomy.  Patient has follow-up appointment on September 21 --lysteda 650 mg tid x 5 days recommended by GYN if needed for heavy menstrual  cycles --short-course of Norco prescribed for abdominal pain associated with the large fibroids.   Uncontrolled hypertension -- continue home hydrochlorothiazide, lisinopril --increased home amlodipine and added hydralazine.   Type II diabetes uncontrolled, insulin-dependent with hyperglycemia --A1c 9.4 --after discussion with diabetic coordinator, pt was discharged on Novolog 70/30 insulin 45 units BID.  Insulin aspart d/c'ed.  Continue home metformin. -- patient advised to establish care with endocrinology as outpatient   Hypokalemia -- replaced potassium   Acute renal failure  suspect 2/2 dehydration with uncontrolled sugars --Cr 1.53 on presentation, improved to 0.95 after IVF.   Discharge Diagnoses:  Principal Problem:   Acute blood loss anemia Active Problems:   Hypertensive urgency   Diabetic ketoacidosis associated with type 1 diabetes mellitus (HCC)   Menometrorrhagia   AKI (acute kidney injury) ([place])   Diabetic peripheral neuropathy (HCC)   Anemia   30 Day Unplanned Readmission Risk Score    Flowsheet Row ED to Hosp-Admission (Current) from 10/13/2021 in [facility]  30 Day Unplanned Readmission Risk Score (%) 14.79 Filed at 10/15/2021 1600       This score is the patient's risk of an unplanned readmission within 30 days of being discharged (0 -100%). The score is based on dignosis, age, lab data, medications, orders, and past utilization.   Low:  0-14.9   Medium: 15-21.9   High: 22-29.9   Extreme: 30 and above         Discharge Instructions:  Allergies as of 10/16/2021       Reactions   Demerol [meperidine] Hives, Itching, Nausea And Vomiting  Meloxicam    Morphine Other (See Comments)   Other Reaction: tolerates hydromorphone   Oxycodone Hives, Itching, Nausea And Vomiting   Toradol [ketorolac Tromethamine] Hives, Itching, Nausea And Vomiting        Medication List     STOP taking these medications    famotidine 20 MG  tablet Commonly known as: PEPCID   insulin regular 100 units/mL injection Commonly known as: NOVOLIN R       TAKE these medications    amLODipine 10 MG tablet Commonly known as: NORVASC Take 1 tablet (10 mg total) by mouth daily. Increased from 5 mg. What changed:  medication strength how much to take additional instructions   cyclobenzaprine 10 MG tablet Commonly known as: FLEXERIL Take 10 mg by mouth 3 (three) times daily.   ferrous sulfate 325 (65 FE) MG EC tablet Take 1 tablet (325 mg total) by mouth 2 (two) times daily.   folic acid 1 MG tablet Commonly known as: FOLVITE Take 1 tablet (1 mg total) by mouth daily.   gabapentin 300 MG capsule Commonly known as: NEURONTIN Take 600 mg by mouth 3 (three) times daily.   hydrALAZINE 25 MG tablet Commonly known as: APRESOLINE Take 1 tablet (25 mg total) by mouth every 8 (eight) hours.   hydrochlorothiazide 25 MG tablet Commonly known as: HYDRODIURIL Take 25 mg by mouth daily.   HYDROcodone-acetaminophen 5-325 MG tablet Commonly known as: NORCO/VICODIN Take 1-2 tablets by mouth every 6 (six) hours as needed for up to 5 days for severe pain or moderate pain.   lisinopril 40 MG tablet Commonly known as: ZESTRIL Take 40 mg by mouth daily.   metFORMIN 1000 MG tablet Commonly known as: GLUCOPHAGE Take 1 tablet (1,000 mg total) by mouth 2 (two) times daily with a meal.   NovoLOG 70/30 FlexPen (70-30) 100 UNIT/ML FlexPen Generic drug: insulin aspart protamine - aspart Inject 45 Units into the skin 2 (two) times daily with a meal. What changed:  how much to take when to take this   Pen Needles 3/16" 31G X 5 MM Misc 1 Units by Does not apply route 2 (two) times daily.   tranexamic acid 650 MG Tabs tablet Commonly known as: Lysteda Take 1 tablet (650 mg total) by mouth 3 (three) times daily for 5 days. If you have heavy menstrual bleeding.         Follow-up [place], [place] Follow up in 1 week(s).   Specialty: General Practice Why: Appointment on Tuesday, 10/22/2021 at 10:40am. Contact information: [address] [place] [address] [phone]                 Allergies  Allergen Reactions   Demerol [Meperidine] Hives, Itching and Nausea And Vomiting   Meloxicam    Morphine Other (See Comments)    Other Reaction: tolerates hydromorphone   Oxycodone Hives, Itching and Nausea And Vomiting   Toradol [Ketorolac Tromethamine] Hives, Itching and Nausea And Vomiting     The results of significant diagnostics from this hospitalization (including imaging, microbiology, ancillary and laboratory) are listed below for reference.   Consultations:   Procedures/Studies: US PELVIS (TRANSABDOMINAL ONLY)  Result Date: 10/14/2021 CLINICAL DATA:  Uterine fibroids.  Vaginal bleeding. EXAM: TRANSABDOMINAL ULTRASOUND OF PELVIS TECHNIQUE: Transabdominal ultrasound examination of the pelvis was performed including evaluation of the uterus, ovaries, adnexal regions, and pelvic cul-de-sac. COMPARISON:  Pelvic ultrasound 04/17/2021. CT abdomen and pelvis 09/23/2016. FINDINGS: The patient  declined transvaginal examination which limits complete assessment. Uterus Measurements: 14.1 x 8.3 x 9.5 cm = volume: 581 mL. Multiple uterine fibroids are again seen, including a 5.6 x 5.8 x 6.7 cm fundal fibroid, 5.9 x 5.5 x 5.3 cm posterior uterine fibroid, and 10.2 x 9.5 x 10.2 cm exophytic fibroid extending laterally from the fundus on the right. The longest dimensions of each of these fibroids are within 0.5 cm of those on the prior ultrasound. Endometrium Thickness: Estimated at 10 mm but poorly visualized due to the fibroids and lack of transvaginal imaging. Right ovary Measurements: 6.5 x 5.4 x 6.6 cm = volume: 121 mL. Generalized enlarged appearance with multiple peripherally located follicles. Left ovary Measurements: 5.4 x 4.5 x 5.6 cm = volume: 73 mL. 5.2 x  4.8 x 4.5 cm cyst, possibly containing minimal internal debris/blood products versus artifact. Other findings:  No abnormal free fluid. IMPRESSION: 1. Chronically enlarged uterus containing multiple fibroids, overall similar in size to the prior ultrasound. 2. Suboptimal visualization of the endometrium. 3. Enlarged right ovary with multiple peripherally located follicles. 4. 5.2 cm left ovarian cyst. Given size and possible internal debris/blood products, a follow-up ultrasound is recommended in 6-12 weeks to ensure resolution. Electronically Signed   By: Logan Bores M.D.   On: 10/14/2021 14:53   DG Chest 2 View  Result Date: 10/13/2021 CLINICAL DATA:  Dyspnea EXAM: CHEST - 2 VIEW COMPARISON:  Radiographs 04/17/2021 FINDINGS: No focal consolidation, pleural effusion, or pneumothorax. Normal cardiomediastinal silhouette. No acute osseous abnormality. IMPRESSION: No active cardiopulmonary disease. Electronically Signed   By: Placido Sou M.D.   On: 10/13/2021 19:37      Labs: BNP (last 3 results) No results for input(s): "BNP" in the last 8760 hours. Basic Metabolic Panel: Recent Labs  Lab 10/13/21 1812 10/14/21 0407  NA 132* 138  K 3.8 3.3*  CL 104 109  CO2 17* 21*  GLUCOSE 479* 316*  BUN 16 13  CREATININE 1.53* 0.95  CALCIUM 8.5* 8.0*   Liver Function Tests: Recent Labs  Lab 10/13/21 1812  AST 37  ALT 23  ALKPHOS 53  BILITOT 0.6  PROT 7.5  ALBUMIN 3.3*   No results for input(s): "LIPASE", "AMYLASE" in the last 168 hours. No results for input(s): "AMMONIA" in the last 168 hours. CBC: Recent Labs  Lab 10/13/21 1812 10/14/21 0407 10/15/21 1438 10/16/21 0124  WBC 9.0 9.0  --   --   HGB 6.8* 7.8* 7.7* 9.6*  HCT 25.7* 27.3*  --  32.8*  MCV 68.2* 70.0*  --   --   PLT 523* 480*  --   --    Cardiac Enzymes: No results for input(s): "CKTOTAL", "CKMB", "CKMBINDEX", "TROPONINI" in the last 168 hours. BNP: Invalid input(s): "POCBNP" CBG: Recent Labs  Lab  10/15/21 1114 10/15/21 1522 10/15/21 2124 10/16/21 0757 10/16/21 1230  GLUCAP 304* 274* 231* 247* 277*   D-Dimer No results for input(s): "DDIMER" in the last 72 hours. Hgb A1c No results for input(s): "HGBA1C" in the last 72 hours.  Lipid Profile No results for input(s): "CHOL", "HDL", "LDLCALC", "TRIG", "CHOLHDL", "LDLDIRECT" in the last 72 hours. Thyroid function studies No results for input(s): "TSH", "T4TOTAL", "T3FREE", "THYROIDAB" in the last 72 hours.  Invalid input(s): "FREET3" Anemia work up Recent Labs    10/16/21 1106  VITAMINB12 259  FOLATE 4.9*  TIBC 455*  IRON 55   Urinalysis    Component Value Date/Time   COLORURINE STRAW (A) 10/13/2021 [place]  CLEAR (A) 10/13/2021 [place] 05/28/2013 2350   LABSPEC 1.020 10/13/2021 1854   LABSPEC 1.015 05/28/2013 2350   PHURINE 5.0 10/13/2021 1854   GLUCOSEU >=500 (A) 10/13/2021 1854   GLUCOSEU >=500 05/28/2013 2350   HGBUR NEGATIVE 10/13/2021 1854   BILIRUBINUR NEGATIVE 10/13/2021 1854   BILIRUBINUR Negative 05/28/2013 2350   KETONESUR NEGATIVE 10/13/2021 [place] 10/13/2021 1854   NITRITE NEGATIVE 10/13/2021 1854   LEUKOCYTESUR NEGATIVE 10/13/2021 1854   LEUKOCYTESUR Negative 05/28/2013 2350   Sepsis Labs Recent Labs  Lab 10/13/21 1812 10/14/21 0407  WBC 9.0 9.0   Microbiology No results found for this or any previous visit (from the past 240 hour(s)).   Total time spend on discharging this patient, including the last patient exam, discussing the hospital stay, instructions for ongoing care as it relates to all pertinent caregivers, as well as preparing the medical discharge records, prescriptions, and/or referrals as applicable, is 50 minutes.    Enzo Bi, MD  [facility] 10/18/2021, 8:11 PM

## 2021-10-16 NOTE — Progress Notes (Unsigned)
Venofer for iron deficient anemia

## 2021-10-16 NOTE — Inpatient Diabetes Management (Signed)
Inpatient Diabetes Program Recommendations  AACE/ADA: New Consensus Statement on Inpatient Glycemic Control   Target Ranges:  Prepandial:   less than 140 mg/dL      Peak postprandial:   less than 180 mg/dL (1-2 hours)      Critically ill patients:  140 - 180 mg/dL    Latest Reference Range & Units 10/16/21 07:57  Glucose-Capillary 70 - 99 mg/dL 247 (H)    Latest Reference Range & Units 10/15/21 07:40 10/15/21 11:14 10/15/21 15:22 10/15/21 21:24  Glucose-Capillary 70 - 99 mg/dL 263 (H) 304 (H) 274 (H) 231 (H)   Review of Glycemic Control  Diabetes history: DM2 Outpatient Diabetes medications:  Novolog 70/30 60 units TID with meals, Novolin R 30 units TID with meals, Metformin 1000 mg BID Current orders for Inpatient glycemic control: Semglee 30 units BID, Novolog 0-20 units TID with meals, Novolog 0-5 units   Inpatient Diabetes Program Recommendations:    Outpatient DM medications: At time of discharge, may want to consider discharging on 70/30 45 units BID (dose would provide a total of 63 units of basal and 27 units of meal coverage per day).   NOTE: Communicated with Dr. Billie Ruddy regarding glucose trends and discharge recommendations.   Thanks, Barnie Alderman, RN, MSN, [place] Diabetes Coordinator Inpatient Diabetes Program [phone] (Team Pager from 8am to [place])

## 2022-05-05 DIAGNOSIS — I4891 Unspecified atrial fibrillation: Secondary | ICD-10-CM

## 2022-05-05 HISTORY — DX: Unspecified atrial fibrillation: I48.91

## 2022-05-23 ENCOUNTER — Other Ambulatory Visit: Payer: Self-pay

## 2022-05-23 ENCOUNTER — Observation Stay
Admission: EM | Admit: 2022-05-23 | Discharge: 2022-05-24 | Disposition: A | Discharge status: Home / Self Care | Payer: Medicaid Other | Attending: Internal Medicine | Admitting: Internal Medicine

## 2022-05-23 ENCOUNTER — Emergency Department: Payer: Medicaid Other

## 2022-05-23 DIAGNOSIS — E119 Type 2 diabetes mellitus without complications: Secondary | ICD-10-CM | POA: Diagnosis not present

## 2022-05-23 DIAGNOSIS — E785 Hyperlipidemia, unspecified: Secondary | ICD-10-CM | POA: Diagnosis present

## 2022-05-23 DIAGNOSIS — R7989 Other specified abnormal findings of blood chemistry: Secondary | ICD-10-CM | POA: Insufficient documentation

## 2022-05-23 DIAGNOSIS — Z794 Long term (current) use of insulin: Secondary | ICD-10-CM | POA: Diagnosis not present

## 2022-05-23 DIAGNOSIS — E663 Overweight: Secondary | ICD-10-CM | POA: Diagnosis present

## 2022-05-23 DIAGNOSIS — I10 Essential (primary) hypertension: Secondary | ICD-10-CM | POA: Insufficient documentation

## 2022-05-23 DIAGNOSIS — E1165 Type 2 diabetes mellitus with hyperglycemia: Secondary | ICD-10-CM

## 2022-05-23 DIAGNOSIS — Z720 Tobacco use: Secondary | ICD-10-CM | POA: Diagnosis present

## 2022-05-23 DIAGNOSIS — D509 Iron deficiency anemia, unspecified: Secondary | ICD-10-CM | POA: Diagnosis present

## 2022-05-23 DIAGNOSIS — F17210 Nicotine dependence, cigarettes, uncomplicated: Secondary | ICD-10-CM | POA: Insufficient documentation

## 2022-05-23 DIAGNOSIS — I4891 Unspecified atrial fibrillation: Principal | ICD-10-CM | POA: Insufficient documentation

## 2022-05-23 DIAGNOSIS — Z79899 Other long term (current) drug therapy: Secondary | ICD-10-CM | POA: Insufficient documentation

## 2022-05-23 DIAGNOSIS — E1151 Type 2 diabetes mellitus with diabetic peripheral angiopathy without gangrene: Secondary | ICD-10-CM | POA: Insufficient documentation

## 2022-05-23 DIAGNOSIS — R112 Nausea with vomiting, unspecified: Secondary | ICD-10-CM | POA: Diagnosis present

## 2022-05-23 DIAGNOSIS — R079 Chest pain, unspecified: Secondary | ICD-10-CM | POA: Diagnosis present

## 2022-05-23 DIAGNOSIS — D72829 Elevated white blood cell count, unspecified: Secondary | ICD-10-CM | POA: Insufficient documentation

## 2022-05-23 DIAGNOSIS — I5A Non-ischemic myocardial injury (non-traumatic): Secondary | ICD-10-CM | POA: Diagnosis not present

## 2022-05-23 DIAGNOSIS — E1142 Type 2 diabetes mellitus with diabetic polyneuropathy: Secondary | ICD-10-CM | POA: Diagnosis not present

## 2022-05-23 LAB — BASIC METABOLIC PANEL
Anion gap: 14 (ref 5–15)
Anion gap: 9 (ref 5–15)
BUN: 16 mg/dL (ref 6–20)
BUN: 15 mg/dL (ref 6–20)
CO2: 16 mmol/L — ABNORMAL LOW (ref 22–32)
CO2: 19 mmol/L — ABNORMAL LOW (ref 22–32)
Calcium: 8.8 mg/dL — ABNORMAL LOW (ref 8.9–10.3)
Calcium: 8.1 mg/dL — ABNORMAL LOW (ref 8.9–10.3)
Chloride: 103 mmol/L (ref 98–111)
Chloride: 108 mmol/L (ref 98–111)
Creatinine, Ser: 1.17 mg/dL — ABNORMAL HIGH (ref 0.44–1.00)
Creatinine, Ser: 0.97 mg/dL (ref 0.44–1.00)
GFR, Estimated: 59 mL/min — ABNORMAL LOW (ref >60)
GFR, Estimated: >60 mL/min (ref >60)
Glucose, Bld: 426 mg/dL — ABNORMAL HIGH (ref 70–99)
Glucose, Bld: 326 mg/dL — ABNORMAL HIGH (ref 70–99)
Potassium: 5.3 mmol/L — ABNORMAL HIGH (ref 3.5–5.1)
Potassium: 4.1 mmol/L (ref 3.5–5.1)
Sodium: 133 mmol/L — ABNORMAL LOW (ref 135–145)
Sodium: 136 mmol/L (ref 135–145)

## 2022-05-23 LAB — LIPID PANEL
Cholesterol: 209 mg/dL — ABNORMAL HIGH (ref 0–200)
HDL: 30 mg/dL — ABNORMAL LOW (ref >40)
LDL Cholesterol: UNDETERMINED mg/dL (ref 0–99)
Total CHOL/HDL Ratio: 7 RATIO
Triglycerides: 585 mg/dL — ABNORMAL HIGH (ref <150)
VLDL: UNDETERMINED mg/dL (ref 0–40)

## 2022-05-23 LAB — TROPONIN I (HIGH SENSITIVITY)
Troponin I (High Sensitivity): 13 ng/L (ref <18)
Troponin I (High Sensitivity): 42 ng/L — ABNORMAL HIGH (ref <18)
Troponin I (High Sensitivity): 116 ng/L (ref <18)
Troponin I (High Sensitivity): 154 ng/L (ref <18)
Troponin I (High Sensitivity): 319 ng/L (ref <18)
Troponin I (High Sensitivity): 352 ng/L (ref <18)

## 2022-05-23 LAB — CBG MONITORING, ED
Glucose-Capillary: 228 mg/dL — ABNORMAL HIGH (ref 70–99)
Glucose-Capillary: 112 mg/dL — ABNORMAL HIGH (ref 70–99)

## 2022-05-23 LAB — HEPATIC FUNCTION PANEL
ALT: 9 U/L (ref 0–44)
AST: 14 U/L — ABNORMAL LOW (ref 15–41)
Albumin: 1.7 g/dL — ABNORMAL LOW (ref 3.5–5.0)
Alkaline Phosphatase: 28 U/L — ABNORMAL LOW (ref 38–126)
Bilirubin, Direct: <0.1 mg/dL (ref 0.0–0.2)
Total Bilirubin: 0.4 mg/dL (ref 0.3–1.2)
Total Protein: 3.8 g/dL — ABNORMAL LOW (ref 6.5–8.1)

## 2022-05-23 LAB — HEMOGLOBIN A1C
Hgb A1c MFr Bld: 9.5 % — ABNORMAL HIGH (ref 4.8–5.6)
Mean Plasma Glucose: 225.95 mg/dL

## 2022-05-23 LAB — URINE DRUG SCREEN, QUALITATIVE (ARMC ONLY)
Amphetamines, Ur Screen: NOT DETECTED
Barbiturates, Ur Screen: NOT DETECTED
Benzodiazepine, Ur Scrn: NOT DETECTED
Cannabinoid 50 Ng, Ur ~~LOC~~: NOT DETECTED
Cocaine Metabolite,Ur ~~LOC~~: NOT DETECTED
MDMA (Ecstasy)Ur Screen: NOT DETECTED
Methadone Scn, Ur: NOT DETECTED
Opiate, Ur Screen: POSITIVE — AB
Phencyclidine (PCP) Ur S: NOT DETECTED
Tricyclic, Ur Screen: NOT DETECTED

## 2022-05-23 LAB — CBC
HCT: 33 % — ABNORMAL LOW (ref 36.0–46.0)
Hemoglobin: 9.4 g/dL — ABNORMAL LOW (ref 12.0–15.0)
MCH: 20.3 pg — ABNORMAL LOW (ref 26.0–34.0)
MCHC: 28.5 g/dL — ABNORMAL LOW (ref 30.0–36.0)
MCV: 71.4 fL — ABNORMAL LOW (ref 80.0–100.0)
Platelets: 291 10*3/uL (ref 150–400)
RBC: 4.62 MIL/uL (ref 3.87–5.11)
RDW: 19.4 % — ABNORMAL HIGH (ref 11.5–15.5)
WBC: 12.4 10*3/uL — ABNORMAL HIGH (ref 4.0–10.5)
nRBC: 0 % (ref 0.0–0.2)

## 2022-05-23 LAB — PROTIME-INR
INR: 1 (ref 0.8–1.2)
INR: 1.3 — ABNORMAL HIGH (ref 0.8–1.2)
Prothrombin Time: 12.9 seconds (ref 11.4–15.2)
Prothrombin Time: 15.7 seconds — ABNORMAL HIGH (ref 11.4–15.2)

## 2022-05-23 LAB — POC URINE PREG, ED: Preg Test, Ur: NEGATIVE

## 2022-05-23 LAB — MAGNESIUM: Magnesium: 1.9 mg/dL (ref 1.7–2.4)

## 2022-05-23 LAB — APTT: aPTT: 80 seconds — ABNORMAL HIGH (ref 24–36)

## 2022-05-23 LAB — LIPASE, BLOOD: Lipase: 29 U/L (ref 11–51)

## 2022-05-23 LAB — TSH: TSH: 3.3 u[IU]/mL (ref 0.350–4.500)

## 2022-05-23 MED ORDER — HYDRALAZINE HCL 20 MG/ML IJ SOLN: 5.0000 mg | INTRAMUSCULAR | Status: DC | PRN

## 2022-05-23 MED ORDER — INSULIN ASPART 100 UNIT/ML IJ SOLN
0.0000 [IU] | Freq: Three times a day (TID) | INTRAMUSCULAR | Status: DC
  Administered 2022-05-23: 3 [IU] via SUBCUTANEOUS
  Administered 2022-05-24: 7 [IU] via SUBCUTANEOUS
  Administered 2022-05-24: 4 [IU] via SUBCUTANEOUS
  Filled 2022-05-23 (×3): qty 1

## 2022-05-23 MED ORDER — FERROUS SULFATE 325 (65 FE) MG PO TABS
325.0000 mg | ORAL_TABLET | Freq: Two times a day (BID) | ORAL | Status: DC
  Administered 2022-05-23 – 2022-05-24 (×2): 325 mg via ORAL
  Filled 2022-05-23 (×2): qty 1

## 2022-05-23 MED ORDER — HYDRALAZINE HCL 25 MG PO TABS
25.0000 mg | ORAL_TABLET | Freq: Three times a day (TID) | ORAL | Status: DC
  Administered 2022-05-23 – 2022-05-24 (×3): 25 mg via ORAL
  Filled 2022-05-23 (×5): qty 1

## 2022-05-23 MED ORDER — MORPHINE SULFATE (PF) 4 MG/ML IV SOLN
4.0000 mg | Freq: Once | INTRAVENOUS | Status: AC
  Administered 2022-05-23: 4 mg via INTRAVENOUS
  Filled 2022-05-23 (×2): qty 1

## 2022-05-23 MED ORDER — ASPIRIN 81 MG PO TBEC
81.0000 mg | DELAYED_RELEASE_TABLET | Freq: Every day | ORAL | Status: DC
  Administered 2022-05-24: 81 mg via ORAL
  Filled 2022-05-23: qty 1

## 2022-05-23 MED ORDER — HYDROMORPHONE HCL 1 MG/ML IJ SOLN
0.5000 mg | INTRAMUSCULAR | Status: DC | PRN
  Administered 2022-05-23 – 2022-05-24 (×5): 0.5 mg via INTRAVENOUS
  Filled 2022-05-23 (×4): qty 0.5
  Filled 2022-05-23: qty 1

## 2022-05-23 MED ORDER — HYDROMORPHONE HCL 1 MG/ML IJ SOLN
0.5000 mg | Freq: Once | INTRAMUSCULAR | Status: AC
  Administered 2022-05-23: 0.5 mg via INTRAVENOUS
  Filled 2022-05-23: qty 0.5

## 2022-05-23 MED ORDER — ONDANSETRON HCL 4 MG/2ML IJ SOLN
4.0000 mg | Freq: Three times a day (TID) | INTRAMUSCULAR | Status: DC | PRN
  Administered 2022-05-23: 4 mg via INTRAVENOUS
  Filled 2022-05-23: qty 2

## 2022-05-23 MED ORDER — HEPARIN (PORCINE) 25000 UT/250ML-% IV SOLN
1250.0000 [IU]/h | INTRAVENOUS | Status: DC
  Administered 2022-05-23: 900 [IU]/h via INTRAVENOUS
  Administered 2022-05-24: 1250 [IU]/h via INTRAVENOUS
  Filled 2022-05-23 (×2): qty 250

## 2022-05-23 MED ORDER — HYDROCHLOROTHIAZIDE 25 MG PO TABS
25.0000 mg | ORAL_TABLET | Freq: Every day | ORAL | Status: DC
  Administered 2022-05-23 – 2022-05-24 (×2): 25 mg via ORAL
  Filled 2022-05-23 (×2): qty 1

## 2022-05-23 MED ORDER — MORPHINE SULFATE (PF) 4 MG/ML IV SOLN
6.0000 mg | Freq: Once | INTRAVENOUS | Status: AC
  Administered 2022-05-23: 6 mg via INTRAVENOUS
  Filled 2022-05-23: qty 2

## 2022-05-23 MED ORDER — INSULIN ASPART PROT & ASPART (70-30 MIX) 100 UNIT/ML ~~LOC~~ SUSP
45.0000 [IU] | Freq: Three times a day (TID) | SUBCUTANEOUS | Status: DC
  Administered 2022-05-23 – 2022-05-24 (×4): 45 [IU] via SUBCUTANEOUS
  Filled 2022-05-23 (×2): qty 10

## 2022-05-23 MED ORDER — GABAPENTIN 300 MG PO CAPS
600.0000 mg | ORAL_CAPSULE | Freq: Three times a day (TID) | ORAL | Status: DC
  Administered 2022-05-23 – 2022-05-24 (×4): 600 mg via ORAL
  Filled 2022-05-23 (×5): qty 2

## 2022-05-23 MED ORDER — LISINOPRIL 20 MG PO TABS
40.0000 mg | ORAL_TABLET | Freq: Every day | ORAL | Status: DC
  Administered 2022-05-23 – 2022-05-24 (×2): 40 mg via ORAL
  Filled 2022-05-23 (×2): qty 4

## 2022-05-23 MED ORDER — SODIUM CHLORIDE 0.9 % IV BOLUS
1000.0000 mL | Freq: Once | INTRAVENOUS | Status: AC
  Administered 2022-05-23: 1000 mL via INTRAVENOUS

## 2022-05-23 MED ORDER — NITROGLYCERIN 0.4 MG SL SUBL
0.4000 mg | SUBLINGUAL_TABLET | SUBLINGUAL | Status: DC | PRN
  Administered 2022-05-23: 0.4 mg via SUBLINGUAL
  Filled 2022-05-23: qty 1

## 2022-05-23 MED ORDER — ATORVASTATIN CALCIUM 20 MG PO TABS
40.0000 mg | ORAL_TABLET | Freq: Every day | ORAL | Status: DC
  Administered 2022-05-23 – 2022-05-24 (×2): 40 mg via ORAL
  Filled 2022-05-23 (×2): qty 2

## 2022-05-23 MED ORDER — FOLIC ACID 1 MG PO TABS
1.0000 mg | ORAL_TABLET | Freq: Every day | ORAL | Status: DC
  Administered 2022-05-23 – 2022-05-24 (×2): 1 mg via ORAL
  Filled 2022-05-23 (×2): qty 1

## 2022-05-23 MED ORDER — ENOXAPARIN SODIUM 40 MG/0.4ML IJ SOSY: 40.0000 mg | PREFILLED_SYRINGE | INTRAMUSCULAR | Status: DC

## 2022-05-23 MED ORDER — HEPARIN BOLUS VIA INFUSION
4000.0000 [IU] | Freq: Once | INTRAVENOUS | Status: AC
  Administered 2022-05-23: 4000 [IU] via INTRAVENOUS
  Filled 2022-05-23: qty 4000

## 2022-05-23 MED ORDER — DILTIAZEM HCL ER COATED BEADS 180 MG PO CP24
180.0000 mg | ORAL_CAPSULE | Freq: Every day | ORAL | Status: DC
  Administered 2022-05-23 – 2022-05-24 (×2): 180 mg via ORAL
  Filled 2022-05-23 (×2): qty 1

## 2022-05-23 MED ORDER — INSULIN ASPART 100 UNIT/ML IJ SOLN: 0.0000 [IU] | Freq: Every day | INTRAMUSCULAR | Status: DC

## 2022-05-23 MED ORDER — INSULIN ASPART PROT & ASPART (70-30 MIX) 100 UNIT/ML PEN
45.0000 [IU] | PEN_INJECTOR | Freq: Three times a day (TID) | SUBCUTANEOUS | Status: DC
  Filled 2022-05-23: qty 3

## 2022-05-23 MED ORDER — ETOMIDATE 2 MG/ML IV SOLN
8.0000 mg | Freq: Once | INTRAVENOUS | Status: AC
  Administered 2022-05-23: 8 mg via INTRAVENOUS
  Filled 2022-05-23: qty 10

## 2022-05-23 MED ORDER — ASPIRIN 81 MG PO CHEW
324.0000 mg | CHEWABLE_TABLET | Freq: Once | ORAL | Status: AC
  Administered 2022-05-23: 324 mg via ORAL
  Filled 2022-05-23: qty 4

## 2022-05-23 MED ORDER — MORPHINE SULFATE (PF) 4 MG/ML IV SOLN
4.0000 mg | Freq: Once | INTRAVENOUS | Status: AC
  Administered 2022-05-23: 4 mg via INTRAVENOUS
  Filled 2022-05-23: qty 1

## 2022-05-23 MED ORDER — ACETAMINOPHEN 325 MG PO TABS: 650.0000 mg | ORAL_TABLET | Freq: Four times a day (QID) | ORAL | Status: DC | PRN

## 2022-05-23 MED ORDER — CYCLOBENZAPRINE HCL 10 MG PO TABS: 10.0000 mg | ORAL_TABLET | Freq: Every day | ORAL | Status: DC | PRN

## 2022-05-23 MED ORDER — NICOTINE 21 MG/24HR TD PT24
21.0000 mg | MEDICATED_PATCH | Freq: Every day | TRANSDERMAL | Status: DC
  Filled 2022-05-23: qty 1

## 2022-05-23 NOTE — ED Notes (Addendum)
1207: Timeout. HR 165 1208: Sync 100J 1208: Etomidate  1209: Shocked at 100J 1210: HR 97

## 2022-05-23 NOTE — Consult Note (Signed)
ANTICOAGULATION CONSULT NOTE  Pharmacy Consult for heparin infusion Indication: ACS/STEMI  Allergies  Allergen Reactions   Demerol [Meperidine] Hives, Itching and Nausea And Vomiting   Meloxicam Nausea And Vomiting   Morphine Other (See Comments)    Other Reaction: tolerates hydromorphone   Oxycodone Hives, Itching and Nausea And Vomiting   Toradol [Ketorolac Tromethamine] Hives, Itching and Nausea And Vomiting    Patient Measurements: Height:  (167.6 cm) Weight: 82.6 kg (182 lb) IBW/kg (Calculated) : 59.3 Heparin Dosing Weight: 76.7 kg  Vital Signs: Temp: 97.7 F (36.5 C) (04/19 1500) Temp Source: Oral (04/19 1500) BP: 180/101 (04/19 1606) Pulse Rate: 83 (04/19 1600)  Labs: Recent Labs    05/23/22 1020 05/23/22 1028 05/23/22 1228 05/23/22 1343 05/23/22 1527  HGB 9.4*  --   --   --   --   HCT 33.0*  --   --   --   --   PLT 291  --   --   --   --   LABPROT  --  12.9  --   --   --   INR  --  1.0  --   --   --   CREATININE 1.17*  --   --  0.97  --   TROPONINIHS 13  --  42* 116* 154*    Estimated Creatinine Clearance: 80.2 mL/min (by C-G formula based on SCr of 0.97 mg/dL).   Medical History: Past Medical History:  Diagnosis Date   Diabetes mellitus without complication    Hypertension     Medications:  No home anticoagulation per pharmacist review  Assessment: 42yo female presented to ED for chest pain.  Patient found to be in Afib and have elevated troponin.  Pharmacy consulted to start heparin infusion for ACS/STEMI.  Goal of Therapy:  Heparin level 0.3-0.7 units/ml Monitor platelets by anticoagulation protocol: Yes   Plan:  Give 4000 units bolus x 1 Start heparin infusion at 900 units/hr Check anti-Xa level in 6 hours and daily while on heparin Continue to monitor H&H and platelets  Barrie Folk, PharmD 05/23/2022,5:21 PM

## 2022-05-23 NOTE — ED Triage Notes (Signed)
Pt to ED via POV from home. Pt reports centralized CP that started this morning. Pt denies hx of afib and is not on blood thinners. Pt reports hx of HTN.

## 2022-05-23 NOTE — Consult Note (Signed)
Cardiology Consultation:   Patient ID: Jennifer [name]; [id number]; 1980/08/25   Admit date: 05/23/2022 Date of Consult: 05/23/2022  Primary Care Provider: [facility] Primary Cardiologist: New - consult by [facility] Primary Electrophysiologist:  None   Patient Profile:   Jennifer [name] is a 42 y.o. female with a hx of DM2, HTN, microcytic anemia previously requiring transfusion, and obesity who is being seen today for the evaluation of new onset Afib with RVR at the request of Dr. Arnoldo Morale.  History of Present Illness:   Jennifer [name] has no previously known cardiac history. She presented to [facility] ED on 05/23/2022 with sudden onset of tachypalpitations and chest pressure that woke her from sleep with one episode of nausea and vomiting. Upon her arrival she was found to be in new onset Afib with RVR with ventricular rates in the 160s to 180s bpm. BP stable in the 150s/120s. High sensitivity troponin negative. TSH normal. Potassium 5.3 with hemolysis noted, glucose 426, BUN/SCr 16/1.17, corrected sodium 138, magnesium 1.9, Hgb 9.4 (consistent with prior reading), WBC 12.4, PLT 291. CXR without acute process. She was sedated with etomidate and underwent synchronized DCCV in the ED with one shock at 100 J with successful conversion to sinus rhythm. Post DCCV initial BP was elevated in the low 200s systolic. Currently, maintaining sinus rhythm. At time of cardiology consult, further history was unable to be obtained as she had already received etomidate. Upon chart biopsy, no prior KEGs demonstrating Afib in [facility] or Care Everywhere.    Past Medical History:  Diagnosis Date   Diabetes mellitus without complication    Hypertension     Past Surgical History:  Procedure Laterality Date   CESAREAN SECTION     x2   none       Home Meds: Prior to Admission medications   Medication Sig Start Date End Date Taking? Authorizing Provider  amLODipine (NORVASC) 10 MG tablet Take 1  tablet (10 mg total) by mouth daily. Increased from 5 mg. 10/16/21 05/23/22 Yes Darlin Priestly, MD  cyclobenzaprine (FLEXERIL) 10 MG tablet Take 10 mg by mouth daily as needed for muscle spasms. 09/13/21  Yes [provider]  ferrous sulfate 325 (65 FE) MG EC tablet Take 1 tablet (325 mg total) by mouth 2 (two) times daily. 04/17/21 05/23/22 Yes Bradler, Clent Jacks, MD  folic acid (FOLVITE) 1 MG tablet Take 1 tablet (1 mg total) by mouth daily. 10/16/21  Yes Darlin Priestly, MD  gabapentin (NEURONTIN) 300 MG capsule Take 600 mg by mouth 3 (three) times daily. 04/15/21  Yes [provider]  hydrALAZINE (APRESOLINE) 25 MG tablet Take 1 tablet (25 mg total) by mouth every 8 (eight) hours. 10/16/21 05/23/22 Yes Darlin Priestly, MD  hydrochlorothiazide (HYDRODIURIL) 25 MG tablet Take 25 mg by mouth daily.   Yes [provider]  insulin aspart protamine - aspart (NOVOLOG 70/30 FLEXPEN) (70-30) 100 UNIT/ML FlexPen Inject 45 Units into the skin 2 (two) times daily with a meal. Patient taking differently: Inject 45 Units into the skin 3 (three) times daily. 10/16/21 05/23/22 Yes Darlin Priestly, MD  lisinopril (ZESTRIL) 40 MG tablet Take 40 mg by mouth daily. 04/15/21  Yes [provider]  metFORMIN (GLUCOPHAGE) 1000 MG tablet Take 1 tablet (1,000 mg total) by mouth 2 (two) times daily with a meal. 10/16/21 05/23/22 Yes Darlin Priestly, MD  Insulin Pen Needle (PEN NEEDLES 3/16") 31G X 5 MM MISC 1 Units by Does not apply route 2 (two)  times daily. 10/16/21   Darlin Priestly, MD    Inpatient Medications: Scheduled Meds:   morphine injection  4 mg Intravenous Once   Continuous Infusions:  PRN Meds:   Allergies:   Allergies  Allergen Reactions   Demerol [Meperidine] Hives, Itching and Nausea And Vomiting   Meloxicam Nausea And Vomiting   Morphine Other (See Comments)    Other Reaction: tolerates hydromorphone   Oxycodone Hives, Itching and Nausea And Vomiting   Toradol [Ketorolac Tromethamine] Hives, Itching and  Nausea And Vomiting    Social History:   Social History   Socioeconomic History   Marital status: Married    Spouse name: Not on file   Number of children: Not on file   Years of education: Not on file   Highest education level: Not on file  Occupational History   Not on file  Tobacco Use   Smoking status: Every Day    Packs/day: 0    Types: Cigarettes   Smokeless tobacco: Never  Substance and Sexual Activity   Alcohol use: No   Drug use: No   Sexual activity: Not on file  Other Topics Concern   Not on file  Social History Narrative   Not on file   Social Determinants of Health   Financial Resource Strain: Not on file  Food Insecurity: No Food Insecurity (10/13/2021)   Hunger Vital Sign    Worried About Running Out of Food in the Last Year: Never true    Ran Out of Food in the Last Year: Never true  Transportation Needs: No Transportation Needs (10/13/2021)   PRAPARE - Administrator, Civil Service (Medical): No    Lack of Transportation (Non-Medical): No  Physical Activity: Not on file  Stress: Not on file  Social Connections: Not on file  Intimate Partner Violence: Not At Risk (10/13/2021)   Humiliation, Afraid, Rape, and Kick questionnaire    Fear of Current or Ex-Partner: No    Emotionally Abused: No    Physically Abused: No    Sexually Abused: No     Family History:   Family History  Problem Relation Age of Onset   Diabetes Other    Hypertension Other     ROS:  Review of Systems  Constitutional:  Negative for chills, diaphoresis, fever, malaise/fatigue and weight loss.  HENT:  Negative for congestion.   Eyes:  Negative for discharge and redness.  Respiratory:  Negative for cough, sputum production, shortness of breath and wheezing.   Cardiovascular:  Positive for chest pain and palpitations. Negative for orthopnea, claudication, leg swelling and PND.  Gastrointestinal:  Positive for nausea and vomiting. Negative for abdominal pain and  heartburn.  Musculoskeletal:  Negative for falls and myalgias.  Skin:  Negative for rash.  Neurological:  Negative for dizziness, tingling, tremors, sensory change, speech change, focal weakness, loss of consciousness and weakness.  Endo/Heme/Allergies:  Does not bruise/bleed easily.  Psychiatric/Behavioral:  Negative for substance abuse. The patient is not nervous/anxious.   All other systems reviewed and are negative.     Physical Exam/Data:   Vitals:   05/23/22 1045 05/23/22 1100 05/23/22 1130 05/23/22 1200  BP: (!) 132/90 (!) 117/94 (!) 124/111 (!) 141/105  Pulse: (!) 47 (!) 150 (!) 163 (!) 166  Resp: 18 19 (!) 22 15  Temp:      SpO2: 100% 100% 98% 100%   No intake or output data in the 24 hours ending 05/23/22 1224 There were no vitals filed for  this visit. There is no height or weight on file to calculate BMI.   Physical Exam: General: Well developed, well nourished, in no acute distress. Head: Normocephalic, atraumatic, sclera non-icteric, no xanthomas, nares without discharge.  Neck: Negative for carotid bruits. JVD not elevated. Lungs: Clear bilaterally to auscultation without wheezes, rales, or rhonchi. Breathing is unlabored. Heart: RRR with S1 S2. No murmurs, rubs, or gallops appreciated. Abdomen: Soft, non-tender, non-distended with normoactive bowel sounds. No hepatomegaly. No rebound/guarding. No obvious abdominal masses. Msk:  Strength and tone appear normal for age. Extremities: No clubbing or cyanosis. No edema. Distal pedal pulses are 2+ and equal bilaterally. Neuro: Sedated. No facial asymmetry. No focal deficit. Moves all extremities spontaneously. Psych:  Sedated.   EKG:  The EKG was personally reviewed and demonstrates: 05/23/2022 - 10:14, Afib with RVR, 176 bpm, nonspecific st/t changes. Post DCCV - NSR, 99 bpm, improved st/t changes Telemetry:  Telemetry was personally reviewed and demonstrates: Afib with RVR with ventricular rates in the 160s to 180s bpm,  converting to sinus rhythm following synchronized DCCV by ED MD  Weights: There were no vitals filed for this visit.  Relevant CV Studies:  2D echo pending  Laboratory Data:  Chemistry Recent Labs  Lab 05/23/22 1020  NA 133*  K 5.3*  CL 103  CO2 16*  GLUCOSE 426*  BUN 16  CREATININE 1.17*  CALCIUM 8.8*  GFRNONAA 59*  ANIONGAP 14    No results for input(s): "PROT", "ALBUMIN", "AST", "ALT", "ALKPHOS", "BILITOT" in the last 168 hours. Hematology Recent Labs  Lab 05/23/22 1020  WBC 12.4*  RBC 4.62  HGB 9.4*  HCT 33.0*  MCV 71.4*  MCH 20.3*  MCHC 28.5*  RDW 19.4*  PLT 291   Cardiac EnzymesNo results for input(s): "TROPONINI" in the last 168 hours. No results for input(s): "TROPIPOC" in the last 168 hours.  BNPNo results for input(s): "BNP", "PROBNP" in the last 168 hours.  DDimer No results for input(s): "DDIMER" in the last 168 hours.  Radiology/Studies:  DG Chest Portable 1 View  Result Date: 05/23/2022  IMPRESSION: No acute cardiopulmonary process. Electronically Signed   By: Wiliam Ke M.D.   On: 05/23/2022 11:26    Assessment and Plan:   1. New onset Afib with RVR: -Presented with sudden onset tachypalpitations and chest pressure and was found to be in new onset Afib with RVR -After discussion with MD, patient had been in arrhythmia for less than 48 hours, she therefore underwent successful DCCV with conversion to sinus rhythm with one shock at 100 J -Electrolytes at goal -TSH normal -Would add Toprol XL vs Cardizem CD for rate and BP control -Obtain echo, may be able to complete in the office if needed -CHADS2VASc 3 (HTN, DM, sex category), defer OAC after discussion with MD given brief onset of atrial arrhythmia -Plan for outpatient Zio patch to evaluate Afib burden  2. HTN: -Monitor post DCCV -Medical therapy as above    For questions or updates, please contact [facility] Please consult [email or web address] for contact info under  Cardiology/STEMI.   Signed, Eula Listen, PA-C [facility] Pager: [phone] 05/23/2022, 12:24 PM

## 2022-05-23 NOTE — ED Provider Notes (Signed)
[facility] Provider Note    Event Date/Time   First MD Initiated Contact with Patient 05/23/22 1020     (approximate)   History   Chest Pain   HPI  Jennifer [name] is a 42 y.o. female past medical history significant for hypertension, diabetes, anemia requiring blood transfusions, who presents to the emergency department with chest pain.  Patient states that chest pain started this morning when she woke up from sleep.  Initially was a burning sensation and now just having a dull pain to her mid chest and both shoulders.  Feels like her heart is racing and skipping beats.  1 episode of nausea and vomiting.  Denies any significant shortness of breath.  Normal state of health last night prior to going to sleep.  Denies any swelling in her legs.  No recent nausea, vomiting or diarrhea.  No unexplained weight gain or weight loss.  Not on blood thinners.  No history of A-fib.  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  Denies daily alcohol use but does drink socially.     Physical Exam   Triage Vital Signs: ED Triage Vitals [05/23/22 1018]  Enc Vitals Group     BP (!) 152/125     Pulse Rate (!) 176     Resp 20     Temp 97.9 F (36.6 C)     Temp src      SpO2 99 %     Weight      Height      Head Circumference      Peak Flow      Pain Score      Pain Loc      Pain Edu?      Excl. in GC?     Most recent vital signs: Vitals:   05/23/22 1500 05/23/22 1513  BP:    Pulse: 79   Resp: 13   Temp:    SpO2: 99% 100%    Physical Exam Constitutional:      Appearance: She is well-developed.  HENT:     Head: Atraumatic.  Eyes:     Conjunctiva/sclera: Conjunctivae normal.  Cardiovascular:     Rate and Rhythm: Tachycardia present. Rhythm irregular.     Heart sounds: No murmur heard. Pulmonary:     Effort: No respiratory distress.     Breath sounds: No decreased breath sounds.  Abdominal:     General: There is no distension.   Musculoskeletal:        General: Normal range of motion.     Cervical back: Normal range of motion.     Right lower leg: No edema.     Left lower leg: No edema.     Comments: +2 DP and radial pulses  Skin:    General: Skin is warm.     Capillary Refill: Capillary refill takes less than 2 seconds.  Neurological:     Mental Status: She is alert. Mental status is at baseline.     IMPRESSION / MDM / ASSESSMENT AND PLAN / ED COURSE  I reviewed the triage vital signs and the nursing notes.  Differential diagnosis including new onset atrial fibrillation, electrolyte abnormality, hyperthyroidism, ACS  EKG  I, Corena Herter, the attending physician, personally viewed and interpreted this ECG.   Rate: 176  Rhythm: Atrial fibrillation  Axis: Normal  Intervals: Normal  ST&T Change: Nonspecific ST changes with ST depression   Atrial fibrillation with a rapid rate up to  180s while on cardiac telemetry.  RADIOLOGY I independently reviewed imaging, my interpretation of imaging: Chest x-ray no signs of pneumonia.  No signs of pulmonary edema or heart failure.  Read as no acute findings.  LABS (all labs ordered are listed, but only abnormal results are displayed) Labs interpreted as -    Labs Reviewed  BASIC METABOLIC PANEL - Abnormal; Notable for the following components:      Result Value   Sodium 133 (*)    Potassium 5.3 (*)    CO2 16 (*)    Glucose, Bld 426 (*)    Creatinine, Ser 1.17 (*)    Calcium 8.8 (*)    GFR, Estimated 59 (*)    All other components within normal limits  CBC - Abnormal; Notable for the following components:   WBC 12.4 (*)    Hemoglobin 9.4 (*)    HCT 33.0 (*)    MCV 71.4 (*)    MCH 20.3 (*)    MCHC 28.5 (*)    RDW 19.4 (*)    All other components within normal limits  BASIC METABOLIC PANEL - Abnormal; Notable for the following components:   CO2 19 (*)    Glucose, Bld 326 (*)    Calcium 8.1 (*)    All other components within normal limits   TROPONIN I (HIGH SENSITIVITY) - Abnormal; Notable for the following components:   Troponin I (High Sensitivity) 42 (*)    All other components within normal limits  TROPONIN I (HIGH SENSITIVITY) - Abnormal; Notable for the following components:   Troponin I (High Sensitivity) 116 (*)    All other components within normal limits  MAGNESIUM  TSH  PROTIME-INR  POC URINE PREG, ED  TROPONIN I (HIGH SENSITIVITY)  TROPONIN I (HIGH SENSITIVITY)     MDM    Low suspicion for ACS, low risk heart score and troponin negative.  Clinical picture concerning for new onset atrial fibrillation with a rapid rate.  Electrolytes within normal limits.  No signs of hyperthyroidism.  Given patient has been within 48 hours of symptom onset after discussion with cardiology with Dr. Mariah Milling will attempt cardioversion.  Patient given an etomidate for procedural sedation.  Converted into normal sinus rhythm.  Repeat EKG with normal sinus rhythm.    Repeat troponin elevated.  Patient continues to have chest pain and hypertension.  Given aspirin.  Discussed with cardiology Dr. Mariah Milling discharging the patient home with close cardiology follow-up versus admission if she had ongoing pain and hypertension.  Given patient's ongoing pain and uptrending troponin which is likely in the setting of demand ischemia from her atrial fibrillation with a rapid rate will admit the patient for further evaluation.  Consulted hospitalist for admission.  PROCEDURES:  Critical Care performed: Yes  .Critical Care  Performed by: Corena Herter, MD Authorized by: Corena Herter, MD   Critical care provider statement:    Critical care time (minutes):  30   Critical care time was exclusive of:  Separately billable procedures and treating other patients   Critical care was necessary to treat or prevent imminent or life-threatening deterioration of the following conditions:  Circulatory failure   Critical care was time spent personally  by me on the following activities:  Development of treatment plan with patient or surrogate, discussions with consultants, evaluation of patient's response to treatment, examination of patient, ordering and review of laboratory studies, ordering and review of radiographic studies, ordering and performing treatments and interventions, pulse oximetry, re-evaluation of patient's  condition and review of old charts .Cardioversion  Date/Time: 05/23/2022 12:04 PM  Performed by: Corena Herter, MD Authorized by: Corena Herter, MD   Consent:    Consent obtained:  Verbal   Consent given by:  Patient   Risks discussed:  Cutaneous burn, induced arrhythmia and pain   Alternatives discussed:  No treatment and alternative treatment Pre-procedure details:    Cardioversion basis:  Elective   Rhythm:  Atrial fibrillation   Electrode placement:  Anterior-posterior Patient sedated: Yes. Refer to sedation procedure documentation for details of sedation.  Attempt one:    Cardioversion mode:  Synchronous   Shock (Joules):  100   Shock outcome:  Conversion to normal sinus rhythm Post-procedure details:    Patient status:  Awake   Patient tolerance of procedure:  Tolerated well, no immediate complications .Sedation  Date/Time: 05/23/2022 12:05 PM  Performed by: Corena Herter, MD Authorized by: Corena Herter, MD   Consent:    Consent obtained:  Verbal   Consent given by:  Patient   Risks discussed:  Allergic reaction, dysrhythmia, inadequate sedation, vomiting and respiratory compromise necessitating ventilatory assistance and intubation   Alternatives discussed:  Analgesia without sedation, anxiolysis and regional anesthesia Universal protocol:    Procedure explained and questions answered to patient or proxy's satisfaction: yes     Relevant documents present and verified: yes     Test results available: yes     Imaging studies available: yes     Required blood products, implants, devices, and  special equipment available: yes     Site/side marked: yes     Immediately prior to procedure, a time out was called: yes     Patient identity confirmed:  Verbally with patient Indications:    Procedure necessitating sedation performed by:  Physician performing sedation Pre-sedation assessment:    Time since last food or drink:  Last night   ASA classification: class 2 - patient with mild systemic disease     Mouth opening:  3 or more finger widths   Thyromental distance:  4 finger widths   Mallampati score:  I - soft palate, uvula, fauces, pillars visible   Neck mobility: normal     Pre-sedation assessments completed and reviewed: airway patency, cardiovascular function, hydration status, mental status, nausea/vomiting, pain level, respiratory function and temperature   Immediate pre-procedure details:    Reassessment: Patient reassessed immediately prior to procedure     Reviewed: vital signs, relevant labs/tests and NPO status     Verified: bag valve mask available, emergency equipment available, intubation equipment available, IV patency confirmed, oxygen available and suction available   Procedure details (see MAR for exact dosages):    Preoxygenation:  Nasal cannula   Sedation:  Propofol   Intended level of sedation: deep   Intra-procedure monitoring:  Blood pressure monitoring, cardiac monitor, continuous pulse oximetry, frequent LOC assessments, frequent vital sign checks and continuous capnometry   Intra-procedure events: none     Total Provider sedation time (minutes):  10 Post-procedure details:    Attendance: Constant attendance by certified staff until patient recovered     Recovery: Patient returned to pre-procedure baseline     Post-sedation assessments completed and reviewed: airway patency, cardiovascular function, hydration status, mental status, nausea/vomiting, pain level, respiratory function and temperature     Patient is stable for discharge or admission: yes      Procedure completion:  Tolerated well, no immediate complications   Patient's presentation is most consistent with acute presentation with potential threat to  life or bodily function.   MEDICATIONS ORDERED IN ED: Medications  morphine (PF) 4 MG/ML injection 4 mg (4 mg Intravenous Given 05/23/22 1059)  morphine (PF) 4 MG/ML injection 4 mg (4 mg Intravenous Given 05/23/22 1225)  etomidate (AMIDATE) injection 8 mg (8 mg Intravenous Given 05/23/22 1208)  sodium chloride 0.9 % bolus 1,000 mL (0 mLs Intravenous Stopped 05/23/22 1225)  HYDROmorphone (DILAUDID) injection 0.5 mg (0.5 mg Intravenous Given 05/23/22 1322)  sodium chloride 0.9 % bolus 1,000 mL (1,000 mLs Intravenous New Bag/Given 05/23/22 1358)  aspirin chewable tablet 324 mg (324 mg Oral Given 05/23/22 1457)    FINAL CLINICAL IMPRESSION(S) / ED DIAGNOSES   Final diagnoses:  Atrial fibrillation with RVR     Rx / DC Orders   ED Discharge Orders     None        Note:  This document was prepared using Dragon voice recognition software and may include unintentional dictation errors.   Corena Herter, MD 05/23/22 [phone]

## 2022-05-23 NOTE — H&P (Signed)
History and Physical    Jennifer [name] HYQ:[id number] DOB: 1980/09/02 DOA: 05/23/2022  Referring MD/NP/PA:   PCP: [facility]   Patient coming from:  The patient is coming from home.     Chief Complaint: Chest pain, heart racing  HPI: Jennifer [name] is a 42 y.o. female with medical history significant of hypertension, hyperlipidemia, diabetes mellitus, iron deficiency anemia, obesity, tobacco abuse, who presents with chest pain and heart racing.  Patient states that she started having chest pain heart racing at about 6:30 this morning.  The chest pain is located in the substernal area, initially 10 out of 10 in severity, currently 8 out of 10 in severity, pressure-like, radiating to bilateral shoulders, not aggravated or alleviated by any known factors.  Denies cough, shortness of breath, fever or chills.  Her chest pain is not pleuritic, not aggravated by deep breath. Patient states she has nausea and vomited twice with nonbilious nonbloody vomiting.  Denies abdominal pain or diarrhea.  Last bowel movement was yesterday.  Denies constipation.  No symptoms of UTI.  No rectal bleeding or dark stool.  No recent fall or head injury.  Patient was found to have new onset A-fib with RVR, heart rate up to 176.  Dr. Mariah Milling of cardiology is consulted.  Patient underwent successful cardioversion.  Currently patient is in sinus rhythm with heart rate 70s.  Data reviewed independently and ED Course: pt was found to have troponin level 13, 42, 116, 154, WBC 12.4, GFR> 60, magnesium 1.9, TSH 3.3, temperature normal, blood pressure 180/101, RR 22, oxygen saturation 100% on 2 L oxygen.  Chest x-ray negative.  Patient is placed on telemetry bed for patient.    EKG: I have personally reviewed.  Atrial fibrillation, QTc 438, low voltage.   Review of Systems:   General: no fevers, chills, no body weight gain, has fatigue HEENT: no blurry vision, hearing changes or sore  throat Respiratory: no dyspnea, coughing, wheezing CV: has chest pain, palpitations GI: has nausea, vomiting, no abdominal pain, diarrhea, constipation GU: no dysuria, burning on urination, increased urinary frequency, hematuria  Ext: no leg edema Neuro: no unilateral weakness, numbness, or tingling, no vision change or hearing loss Skin: no rash, no skin tear. MSK: No muscle spasm, no deformity, no limitation of range of movement in spin Heme: No easy bruising.  Travel history: No recent long distant travel.   Allergy:  Allergies  Allergen Reactions   Demerol [Meperidine] Hives, Itching and Nausea And Vomiting   Meloxicam Nausea And Vomiting   Morphine Other (See Comments)    Other Reaction: tolerates hydromorphone   Oxycodone Hives, Itching and Nausea And Vomiting   Toradol [Ketorolac Tromethamine] Hives, Itching and Nausea And Vomiting    Past Medical History:  Diagnosis Date   Diabetes mellitus without complication    Hypertension     Past Surgical History:  Procedure Laterality Date   CESAREAN SECTION     x2   none      Social History:  reports that she has been smoking. She has never used smokeless tobacco. She reports that she does not drink alcohol and does not use drugs.  Family History:  Family History  Problem Relation Age of Onset   Diabetes Other    Hypertension Other      Prior to Admission medications   Medication Sig Start Date End Date Taking? Authorizing Provider  amLODipine (NORVASC) 10 MG tablet Take 1 tablet (10 mg total) by mouth  daily. Increased from 5 mg. 10/16/21 05/23/22 Yes Darlin Priestly, MD  cyclobenzaprine (FLEXERIL) 10 MG tablet Take 10 mg by mouth daily as needed for muscle spasms. 09/13/21  Yes [provider]  ferrous sulfate 325 (65 FE) MG EC tablet Take 1 tablet (325 mg total) by mouth 2 (two) times daily. 04/17/21 05/23/22 Yes Bradler, Clent Jacks, MD  folic acid (FOLVITE) 1 MG tablet Take 1 tablet (1 mg total) by mouth daily. 10/16/21   Yes Darlin Priestly, MD  gabapentin (NEURONTIN) 300 MG capsule Take 600 mg by mouth 3 (three) times daily. 04/15/21  Yes [provider]  hydrALAZINE (APRESOLINE) 25 MG tablet Take 1 tablet (25 mg total) by mouth every 8 (eight) hours. 10/16/21 05/23/22 Yes Darlin Priestly, MD  hydrochlorothiazide (HYDRODIURIL) 25 MG tablet Take 25 mg by mouth daily.   Yes [provider]  insulin aspart protamine - aspart (NOVOLOG 70/30 FLEXPEN) (70-30) 100 UNIT/ML FlexPen Inject 45 Units into the skin 2 (two) times daily with a meal. Patient taking differently: Inject 45 Units into the skin 3 (three) times daily. 10/16/21 05/23/22 Yes Darlin Priestly, MD  lisinopril (ZESTRIL) 40 MG tablet Take 40 mg by mouth daily. 04/15/21  Yes [provider]  metFORMIN (GLUCOPHAGE) 1000 MG tablet Take 1 tablet (1,000 mg total) by mouth 2 (two) times daily with a meal. 10/16/21 05/23/22 Yes Darlin Priestly, MD  Insulin Pen Needle (PEN NEEDLES 3/16") 31G X 5 MM MISC 1 Units by Does not apply route 2 (two) times daily. 10/16/21   Darlin Priestly, MD    Physical Exam: Vitals:   05/23/22 1530 05/23/22 1600 05/23/22 1606 05/23/22 1607  BP: (!) 166/92 (!) 180/101 (!) 180/101   Pulse: 70 83    Resp: 15 17    Temp:      TempSrc:      SpO2: 100% 100%    Weight:    82.6 kg  Height:    5\' 6"  (1.676 m)   General: Not in acute distress HEENT:       Eyes: PERRL, EOMI, no scleral icterus.       ENT: No discharge from the ears and nose, no pharynx injection, no tonsillar enlargement.        Neck: No JVD, no bruit, no mass felt. Heme: No neck lymph node enlargement. Cardiac: S1/S2, RRR, No murmurs, No gallops or rubs. Respiratory: No rales, wheezing, rhonchi or rubs. GI: Soft, nondistended, nontender, no rebound pain, no organomegaly, BS present. GU: No hematuria Ext: No pitting leg edema bilaterally. 1+DP/PT pulse bilaterally. Musculoskeletal: No joint deformities, No joint redness or warmth, no limitation of ROM in spin. Skin: No rashes.   Neuro: Alert, oriented X3, cranial nerves II-XII grossly intact, moves all extremities normally.  Psych: Patient is not psychotic, no suicidal or hemocidal ideation.  Labs on Admission: I have personally reviewed following labs and imaging studies  CBC: Recent Labs  Lab 05/23/22 1020  WBC 12.4*  HGB 9.4*  HCT 33.0*  MCV 71.4*  PLT 291   Basic Metabolic Panel: Recent Labs  Lab 05/23/22 1020 05/23/22 1343  NA 133* 136  K 5.3* 4.1  CL 103 108  CO2 16* 19*  GLUCOSE 426* 326*  BUN 16 15  CREATININE 1.17* 0.97  CALCIUM 8.8* 8.1*  MG 1.9  --    GFR: Estimated Creatinine Clearance: 80.2 mL/min (by C-G formula based on SCr of 0.97 mg/dL). Liver Function Tests: No results for input(s): "AST", "ALT", "ALKPHOS", "BILITOT", "PROT", "ALBUMIN" in  the last 168 hours. No results for input(s): "LIPASE", "AMYLASE" in the last 168 hours. No results for input(s): "AMMONIA" in the last 168 hours. Coagulation Profile: Recent Labs  Lab 05/23/22 1028  INR 1.0   Cardiac Enzymes: No results for input(s): "CKTOTAL", "CKMB", "CKMBINDEX", "TROPONINI" in the last 168 hours. BNP (last 3 results) No results for input(s): "PROBNP" in the last 8760 hours. HbA1C: No results for input(s): "HGBA1C" in the last 72 hours. CBG: No results for input(s): "GLUCAP" in the last 168 hours. Lipid Profile: No results for input(s): "CHOL", "HDL", "LDLCALC", "TRIG", "CHOLHDL", "LDLDIRECT" in the last 72 hours. Thyroid Function Tests: Recent Labs    05/23/22 1020  TSH 3.300   Anemia Panel: No results for input(s): "VITAMINB12", "FOLATE", "FERRITIN", "TIBC", "IRON", "RETICCTPCT" in the last 72 hours. Urine analysis:    Component Value Date/Time   COLORURINE STRAW (A) 10/13/2021 1854   APPEARANCEUR CLEAR (A) 10/13/2021 1854   APPEARANCEUR Hazy 05/28/2013 2350   LABSPEC 1.020 10/13/2021 1854   LABSPEC 1.015 05/28/2013 2350   PHURINE 5.0 10/13/2021 1854   GLUCOSEU >=500 (A) 10/13/2021 1854   GLUCOSEU  >=500 05/28/2013 2350   HGBUR NEGATIVE 10/13/2021 1854   BILIRUBINUR NEGATIVE 10/13/2021 1854   BILIRUBINUR Negative 05/28/2013 2350   KETONESUR NEGATIVE 10/13/2021 1854   PROTEINUR NEGATIVE 10/13/2021 1854   NITRITE NEGATIVE 10/13/2021 1854   LEUKOCYTESUR NEGATIVE 10/13/2021 1854   LEUKOCYTESUR Negative 05/28/2013 2350   Sepsis Labs: (procalcitonin:4,lacticidven:4) )No results found for this or any previous visit (from the past 240 hour(s)).   Radiological Exams on Admission: DG Chest Portable 1 View  Result Date: 05/23/2022 CLINICAL DATA:  Chest EXAM: PORTABLE CHEST 1 VIEW COMPARISON:  10/13/2021 FINDINGS: Cardiac and mediastinal contours are within normal limits given AP technique. No focal pulmonary opacity. No pleural effusion or pneumothorax. No acute osseous abnormality. IMPRESSION: No acute cardiopulmonary process. Electronically Signed   By: Wiliam Ke M.D.   On: 05/23/2022 11:26      Assessment/Plan Principal Problem:   Atrial fibrillation with RVR Active Problems:   New onset atrial fibrillation   Myocardial injury   Essential hypertension   Poorly controlled type 2 diabetes mellitus with peripheral neuropathy   HLD (hyperlipidemia)   Iron deficiency anemia   Leukocytosis   Tobacco abuse   Nausea & vomiting   Overweight (BMI 25.0-29.9)   Assessment and Plan:  New onset atrial fibrillation with RVR: Heart rate up to 170s.  Patient underwent successful cardioversion by Dr. Mariah Milling of cardiology.  Current heart rate is in 80s with sinus rhythm. CHADS2VASc 3 (HTN, DM, sex category). Per card, will defer [facility] given brief onset of atrial arrhythmia. TSH normal. - will place in tele bed for obs - started Cardizem CD 180 mg daily - pending 2D Echo  Myocardial injury and chest pain: Trop 13 --> 42 --> 116 --> 154. Still has chest pain of 8/10.  -start ASA and lipitor 40 mg daily -IV Heparin -trend trop  -check A1c and FLP -check UDS  Essential  hypertension: bp 180/101 -IV hydralazine as needed -Newly started Cardizem -Hold amlodipine due to newly started Cardizem use -HCTZ, lisinopril, oral hydralazine  Poorly controlled type 2 diabetes mellitus with peripheral neuropathy: Recent A1c 9.4, poorly controlled.  Patient still taking metformin and 70/30 insulin 45 units 3 times daily -70/30 insulin 45 units 3 times daily -Sliding scale insulin  HLD (hyperlipidemia) -start lipitor 40 mg daily  Iron deficiency anemia: Hemoglobin 9.4 -Continue iron supplement  Leukocytosis: WBC  12.4, no fever, no source of infection identified.  Chest x-ray negative, denies symptoms of UTI.  Likely reactive -Follow-up with CBC  Tobacco abuse -Nicotine patch  Nausea & vomiting: She vomited twice.  Etiology is not clear.  Patient denies abdominal pain, diarrhea or constipation. -Check liver function and lipase -As needed Zofran -Observe closely.  If patient has worsening nausea vomiting, will consider CT scan of abdomen/pelvis.  Overweight (BMI 25.0-29.9): Body weight 82.6, BMI 29.38 -Encourage losing weight -Exercise and healthy diet       DVT ppx: on IV heparin  Code Status: Full code  Family Communication: not done, no family member is at bed side.     Disposition Plan:  Anticipate discharge back to previous environment  Consults called: Dr. Mariah Milling of cardiology  Admission status and Level of care: Telemetry Cardiac:   for obs    Dispo: The patient is from: Home              Anticipated d/c is to: Home              Anticipated d/c date is: 1 day              Patient currently is not medically stable to d/c.    Severity of Illness:  The appropriate patient status for this patient is OBSERVATION. Observation status is judged to be reasonable and necessary in order to provide the required intensity of service to ensure the patient's safety. The patient's presenting symptoms, physical exam findings, and initial radiographic  and laboratory data in the context of their medical condition is felt to place them at decreased risk for further clinical deterioration. Furthermore, it is anticipated that the patient will be medically stable for discharge from the hospital within 2 midnights of admission.        Date of Service 05/23/2022    Lorretta Harp [facility]   If 7PM-7AM, please contact night-coverage [email or web address] 05/23/2022, 5:01 PM

## 2022-05-23 NOTE — ED Notes (Signed)
Consent form in chart. Signed by all parties.

## 2022-05-23 NOTE — ED Notes (Signed)
Pt up to toilet with assistance. 

## 2022-05-23 NOTE — ED Notes (Signed)
Asked patient at this time if she is allergic to morphine even with it listed under allergies and she denies any allergies to morphine at this time.

## 2022-05-24 ENCOUNTER — Observation Stay (HOSPITAL_BASED_OUTPATIENT_CLINIC_OR_DEPARTMENT_OTHER)
Admit: 2022-05-24 | Discharge: 2022-05-24 | Disposition: A | Discharge status: Home / Self Care | Payer: Medicaid Other | Attending: Physician Assistant | Admitting: Physician Assistant

## 2022-05-24 DIAGNOSIS — I10 Essential (primary) hypertension: Secondary | ICD-10-CM | POA: Diagnosis not present

## 2022-05-24 DIAGNOSIS — I4891 Unspecified atrial fibrillation: Secondary | ICD-10-CM | POA: Diagnosis not present

## 2022-05-24 DIAGNOSIS — R7989 Other specified abnormal findings of blood chemistry: Secondary | ICD-10-CM

## 2022-05-24 LAB — BASIC METABOLIC PANEL
Anion gap: 8 (ref 5–15)
BUN: 13 mg/dL (ref 6–20)
CO2: 21 mmol/L — ABNORMAL LOW (ref 22–32)
Calcium: 8.3 mg/dL — ABNORMAL LOW (ref 8.9–10.3)
Chloride: 106 mmol/L (ref 98–111)
Creatinine, Ser: 1.04 mg/dL — ABNORMAL HIGH (ref 0.44–1.00)
GFR, Estimated: >60 mL/min (ref >60)
Glucose, Bld: 288 mg/dL — ABNORMAL HIGH (ref 70–99)
Potassium: 3.7 mmol/L (ref 3.5–5.1)
Sodium: 135 mmol/L (ref 135–145)

## 2022-05-24 LAB — CBC
HCT: 26.6 % — ABNORMAL LOW (ref 36.0–46.0)
Hemoglobin: 7.7 g/dL — ABNORMAL LOW (ref 12.0–15.0)
MCH: 20.9 pg — ABNORMAL LOW (ref 26.0–34.0)
MCHC: 28.9 g/dL — ABNORMAL LOW (ref 30.0–36.0)
MCV: 72.1 fL — ABNORMAL LOW (ref 80.0–100.0)
Platelets: 240 10*3/uL (ref 150–400)
RBC: 3.69 MIL/uL — ABNORMAL LOW (ref 3.87–5.11)
RDW: 18.7 % — ABNORMAL HIGH (ref 11.5–15.5)
WBC: 10.6 10*3/uL — ABNORMAL HIGH (ref 4.0–10.5)
nRBC: 0 % (ref 0.0–0.2)

## 2022-05-24 LAB — ECHOCARDIOGRAM COMPLETE
AR max vel: 2.61 cm2
AV Peak grad: 10.9 mmHg
Ao pk vel: 1.65 m/s
Area-P 1/2: 3.5 cm2
Height: 66 in
S' Lateral: 2.7 cm
Weight: 2912 oz

## 2022-05-24 LAB — HEPARIN LEVEL (UNFRACTIONATED)
Heparin Unfractionated: 0.22 IU/mL — ABNORMAL LOW (ref 0.30–0.70)
Heparin Unfractionated: <0.1 IU/mL — ABNORMAL LOW (ref 0.30–0.70)

## 2022-05-24 LAB — CBG MONITORING, ED
Glucose-Capillary: 321 mg/dL — ABNORMAL HIGH (ref 70–99)
Glucose-Capillary: 321 mg/dL — ABNORMAL HIGH (ref 70–99)

## 2022-05-24 LAB — GLUCOSE, CAPILLARY: Glucose-Capillary: 132 mg/dL — ABNORMAL HIGH (ref 70–99)

## 2022-05-24 LAB — TROPONIN I (HIGH SENSITIVITY): Troponin I (High Sensitivity): 182 ng/L (ref <18)

## 2022-05-24 LAB — HIV ANTIBODY (ROUTINE TESTING W REFLEX): HIV Screen 4th Generation wRfx: NONREACTIVE

## 2022-05-24 MED ORDER — HEPARIN BOLUS VIA INFUSION
1200.0000 [IU] | Freq: Once | INTRAVENOUS | Status: AC
  Administered 2022-05-24: 1200 [IU] via INTRAVENOUS
  Filled 2022-05-24: qty 1200

## 2022-05-24 MED ORDER — HYDROCODONE-ACETAMINOPHEN 5-325 MG PO TABS: 1.0000 | ORAL_TABLET | Freq: Three times a day (TID) | ORAL | Status: DC | PRN

## 2022-05-24 MED ORDER — DILTIAZEM HCL ER COATED BEADS 180 MG PO CP24: 180.0000 mg | ORAL_CAPSULE | Freq: Every day | ORAL | 1 refills | Status: DC

## 2022-05-24 MED ORDER — HYDROCODONE-ACETAMINOPHEN 5-325 MG PO TABS: 1.0000 | ORAL_TABLET | Freq: Three times a day (TID) | ORAL | 0 refills | Status: DC | PRN

## 2022-05-24 MED ORDER — NICOTINE 21 MG/24HR TD PT24: 21.0000 mg | MEDICATED_PATCH | Freq: Every day | TRANSDERMAL | 0 refills | Status: DC

## 2022-05-24 MED ORDER — HEPARIN BOLUS VIA INFUSION
2300.0000 [IU] | Freq: Once | INTRAVENOUS | Status: AC
  Administered 2022-05-24: 2300 [IU] via INTRAVENOUS
  Filled 2022-05-24: qty 2300

## 2022-05-24 MED ORDER — ATORVASTATIN CALCIUM 40 MG PO TABS: 20.0000 mg | ORAL_TABLET | Freq: Every day | ORAL | 0 refills | Status: DC

## 2022-05-24 MED ORDER — TRAMADOL HCL 50 MG PO TABS: 50.0000 mg | ORAL_TABLET | Freq: Four times a day (QID) | ORAL | Status: DC | PRN

## 2022-05-24 NOTE — ED Notes (Signed)
Advised nurse that patient has ready bed 

## 2022-05-24 NOTE — ED Notes (Signed)
Will give overdue insulin once lunch tray delivered.  

## 2022-05-24 NOTE — ED Notes (Signed)
Pt requesting dilaudid. Pt can get next dose at 1421. Pt refuses tylenol at this time.

## 2022-05-24 NOTE — ED Notes (Signed)
Called dietary. Lunch trays will be delivered shortly. Will give insulin once pt has food.

## 2022-05-24 NOTE — Discharge Summary (Signed)
Physician Discharge Summary   Patient: Jennifer [name] MRN: [id number] DOB: 06-Jan-1981  Admit date:     05/23/2022  Discharge date: 05/24/22  Discharge Physician: Enedina Finner   PCP: [facility]   Recommendations at discharge:   patient will follow-up with Dr. Mariah Milling in 1 to 2 weeks follow-up primary care physician or child stroke clinic in 1 to 2 week patient advised to make follow-up appointment with OB/GYN  Dr Feliberto Gottron for uterine fibroids.  Discharge Diagnoses: Principal Problem:   Atrial fibrillation with RVR Active Problems:   New onset atrial fibrillation   Myocardial injury   Essential hypertension   Poorly controlled type 2 diabetes mellitus with peripheral neuropathy   HLD (hyperlipidemia)   Iron deficiency anemia   Leukocytosis   Tobacco abuse   Nausea & vomiting   Overweight (BMI 25.0-29.9)   Elevated troponin   Jennifer [name] is a 42 y.o. female with medical history significant of hypertension, hyperlipidemia, diabetes mellitus, iron deficiency anemia, obesity, tobacco abuse, who presents with chest pain and heart racing.  Patient was found to have new onset A-fib with RVR, heart rate up to 176.  Dr. Mariah Milling of cardiology is consulted.  Patient underwent successful cardioversion.  Currently patient is in sinus rhythm with heart rate 70s   New onset a fib with RVR-- now resolved in sinus rhythm after cardioversion -- patient currently in sinus rhythm -- was started on IV heparin drip not discontinued by Dr. Azucena Cecil -- on Cardizem CD1 80 mg daily -- echo no acute findings. -- No further cardiac evaluation recommended. Okay to discharge from cardiology standpoint  Chest pain appears more musculoskeletal -- helps with IV pain meds-- PRN Norco -- cardiac markers flat  Chronic iron deficiency anemia secondary to menorrhagia in the setting of known history of uterine fibroids -- patient was supposed to see GYN last year to get further  workup with possible hysterectomy. She did not follow-up. -- Hemoglobin 9.4-- 7.7 -- no active bleeding noted -- heparin stopped -- patient on iron pills -- she is recommended/advised to follow-up with GYN as outpatient. Husband was at bedside and voiced understanding  Hyperlipidemia -- started on Lipitor  Essential hypertension: bp 180/101 -IV hydralazine as needed -Newly started Cardizem -Hold amlodipine due to newly started Cardizem use -HCTZ, lisinopril, oral hydralazine -- blood sugar improved  Poorly controlled type 2 diabetes mellitus with peripheral neuropathy: Recent A1c 9.4, poorly controlled.  Patient still taking metformin and 70/30 insulin 45 units 3 times daily -70/30 insulin 45 units 3 times daily -Sliding scale insulin  Overweight (BMI 25.0-29.9): Body weight 82.6, BMI 29.38 -Encourage losing weight -Exercise and healthy diet    Pain control -  Controlled Substance Reporting System database was reviewed. and patient was instructed, not to drive, operate heavy machinery, perform activities at heights, swimming or participation in water activities or provide baby-sitting services while on Pain, Sleep and Anxiety Medications; until their outpatient Physician has advised to do so again. Also recommended to not to take more than prescribed Pain, Sleep and Anxiety Medications.  Consultants: [facility] Procedures performed: CVV  Disposition: Home Diet recommendation:  Discharge Diet Orders (From admission, onward)     Start     Ordered   05/24/22 0000  Diet - low sodium heart healthy        05/24/22 1604   05/24/22 0000  Diet Carb Modified        05/24/22 1604  Cardiac and Carb modified diet DISCHARGE MEDICATION: Allergies as of 05/24/2022       Reactions   Demerol [meperidine] Hives, Itching, Nausea And Vomiting   Meloxicam Nausea And Vomiting   Morphine Other (See Comments)   Other Reaction: tolerates hydromorphone   Oxycodone  Hives, Itching, Nausea And Vomiting   Toradol [ketorolac Tromethamine] Hives, Itching, Nausea And Vomiting        Medication List     STOP taking these medications    amLODipine 10 MG tablet Commonly known as: NORVASC   Pen Needles 3/16" 31G X 5 MM Misc       TAKE these medications    atorvastatin 40 MG tablet Commonly known as: LIPITOR Take 0.5 tablets (20 mg total) by mouth daily. Start taking on: May 25, 2022   cyclobenzaprine 10 MG tablet Commonly known as: FLEXERIL Take 10 mg by mouth daily as needed for muscle spasms.   diltiazem 180 MG 24 hr capsule Commonly known as: CARDIZEM CD Take 1 capsule (180 mg total) by mouth daily. Start taking on: May 25, 2022   ferrous sulfate 325 (65 FE) MG EC tablet Take 1 tablet (325 mg total) by mouth 2 (two) times daily.   folic acid 1 MG tablet Commonly known as: FOLVITE Take 1 tablet (1 mg total) by mouth daily.   gabapentin 300 MG capsule Commonly known as: NEURONTIN Take 600 mg by mouth 3 (three) times daily.   hydrALAZINE 25 MG tablet Commonly known as: APRESOLINE Take 1 tablet (25 mg total) by mouth every 8 (eight) hours.   hydrochlorothiazide 25 MG tablet Commonly known as: HYDRODIURIL Take 25 mg by mouth daily.   HYDROcodone-acetaminophen 5-325 MG tablet Commonly known as: NORCO/VICODIN Take 1 tablet by mouth every 8 (eight) hours as needed for severe pain.   lisinopril 40 MG tablet Commonly known as: ZESTRIL Take 40 mg by mouth daily.   metFORMIN 1000 MG tablet Commonly known as: GLUCOPHAGE Take 1 tablet (1,000 mg total) by mouth 2 (two) times daily with a meal.   nicotine 21 mg/24hr patch Commonly known as: NICODERM CQ - dosed in mg/24 hours Place 1 patch (21 mg total) onto the skin daily. Start taking on: May 25, 2022   NovoLOG 70/30 FlexPen (70-30) 100 UNIT/ML FlexPen Generic drug: insulin aspart protamine - aspart Inject 45 Units into the skin 2 (two) times daily with a meal. What  changed: when to take this        Follow-up Information     Gollan, Tollie Pizza, MD. Schedule an appointment as soon as possible for a visit in 2 days.   Specialty: Cardiology Contact information: [address] [place] [address] [phone]         [facility]. Schedule an appointment as soon as possible for a visit in 1 week(s).   Specialty: General Practice Why: hospital f/u Contact information: [address] [place] [address] [phone]         Schermerhorn, Ihor Austin, MD. Call.   Specialty: Obstetrics and Gynecology Why: to make appt for uterine fibroids and menorrhagia Contact information: [address] [place] [address] [phone]                Filed Weights   05/23/22 1607  Weight: 82.6 kg   Alert and oriented times three not in acute distress. Pallor plus. Appears week. Cardiovascular both heart sounds are normal no gallop , respiratory  no  rhonchi. Abdomen soft nondistended nontender. Extremity no pedal edema. Neuro- alert and oriented times three  Condition at discharge: fair  The results of significant diagnostics from this hospitalization (including imaging, microbiology, ancillary and laboratory) are listed below for reference.   Imaging Studies: ECHOCARDIOGRAM COMPLETE  Result Date: 05/24/2022    ECHOCARDIOGRAM REPORT   Patient Name:   MEKESHA SOLOMON Date of Exam: 05/24/2022 Medical Rec #:  [id number]       Height:       66.0 in Accession #:    [id number]      Weight:       182.0 lb Date of Birth:  1980/09/10       BSA:          1.921 m Patient Age:    42 years        BP:           133/76 mmHg Patient Gender: F               HR:           86 bpm. Exam Location:  [facility] Procedure: 2D Echo and Strain Analysis Indications:     Atrial Fibrillation I48.91  History:         Patient has no prior history of Echocardiogram examinations.  Sonographer:      Overton Mam RDCS, FASE Referring Phys:  [id number] Sondra Barges Diagnosing Phys: Debbe Odea MD  Sonographer Comments: Global longitudinal strain was attempted. IMPRESSIONS  1. Left ventricular ejection fraction, by estimation, is 60 to 65%. The left ventricle has normal function. The left ventricle has no regional wall motion abnormalities. There is moderate concentric left ventricular hypertrophy. Left ventricular diastolic parameters were normal. The average left ventricular global longitudinal strain is -18.0 %. The global longitudinal strain is normal.  2. Right ventricular systolic function is normal. The right ventricular size is not well visualized.  3. The mitral valve is normal in structure. Trivial mitral valve regurgitation.  4. The aortic valve is normal in structure. Aortic valve regurgitation is not visualized. FINDINGS  Left Ventricle: Left ventricular ejection fraction, by estimation, is 60 to 65%. The left ventricle has normal function. The left ventricle has no regional wall motion abnormalities. The average left ventricular global longitudinal strain is -18.0 %. The global longitudinal strain is normal. The left ventricular internal cavity size was normal in size. There is moderate concentric left ventricular hypertrophy. Left ventricular diastolic parameters were normal. Right Ventricle: The right ventricular size is not well visualized. No increase in right ventricular wall thickness. Right ventricular systolic function is normal. Left Atrium: Left atrial size was normal in size. Right Atrium: Right atrial size was normal in size. Pericardium: There is no evidence of pericardial effusion. Mitral Valve: The mitral valve is normal in structure. Trivial mitral valve regurgitation. Tricuspid Valve: The tricuspid valve is normal in structure. Tricuspid valve regurgitation is trivial. Aortic Valve: The aortic valve is normal in structure. Aortic valve regurgitation is not visualized. Aortic  valve peak gradient measures 10.9 mmHg. Pulmonic Valve: The pulmonic valve was not well visualized. Pulmonic valve regurgitation is not visualized. Aorta: The aortic root and ascending aorta are structurally normal, with no evidence of dilitation. Venous: The inferior vena cava was not well visualized. IAS/Shunts: No atrial level shunt detected by color flow Doppler.  LEFT VENTRICLE PLAX 2D LVIDd:         4.10 cm   Diastology LVIDs:  2.70 cm   LV e' medial:    7.72 cm/s LV PW:         1.40 cm   LV E/e' medial:  12.8 LV IVS:        1.70 cm   LV e' lateral:   7.72 cm/s LVOT diam:     2.00 cm   LV E/e' lateral: 12.8 LV SV:         89 LV SV Index:   46        2D Longitudinal Strain LVOT Area:     3.14 cm  2D Strain GLS Avg:     -18.0 %  RIGHT VENTRICLE RV Basal diam:  2.90 cm RV S prime:     15.20 cm/s TAPSE (M-mode): 2.5 cm LEFT ATRIUM             Index        RIGHT ATRIUM           Index LA diam:        3.40 cm 1.77 cm/m   RA Area:     14.60 cm LA Vol (A2C):   65.6 ml 34.14 ml/m  RA Volume:   39.50 ml  20.56 ml/m LA Vol (A4C):   58.5 ml 30.45 ml/m LA Biplane Vol: 66.7 ml 34.72 ml/m  AORTIC VALVE                 PULMONIC VALVE AV Area (Vmax): 2.61 cm     PV Vmax:        1.22 m/s AV Vmax:        165.00 cm/s  PV Peak grad:   6.0 mmHg AV Peak Grad:   10.9 mmHg    RVOT Peak grad: 4 mmHg LVOT Vmax:      137.00 cm/s LVOT Vmean:     87.100 cm/s LVOT VTI:       0.282 m  AORTA Ao Root diam: 3.40 cm Ao Asc diam:  3.30 cm MITRAL VALVE               TRICUSPID VALVE MV Area (PHT): 3.50 cm    TR Peak grad:   22.5 mmHg MV Decel Time: 217 msec    TR Vmax:        237.00 cm/s MV E velocity: 99.00 cm/s MV A velocity: 96.40 cm/s  SHUNTS MV E/A ratio:  1.03        Systemic VTI:  0.28 m                            Systemic Diam: 2.00 cm Debbe Odea MD Electronically signed by Debbe Odea MD Signature Date/Time: 05/24/2022/3:17:11 PM    Final    DG Chest Portable 1 View  Result Date: 05/23/2022 CLINICAL DATA:   Chest EXAM: PORTABLE CHEST 1 VIEW COMPARISON:  10/13/2021 FINDINGS: Cardiac and mediastinal contours are within normal limits given AP technique. No focal pulmonary opacity. No pleural effusion or pneumothorax. No acute osseous abnormality. IMPRESSION: No acute cardiopulmonary process. Electronically Signed   By: Wiliam Ke M.D.   On: 05/23/2022 11:26    Microbiology: Results for orders placed or performed during the hospital encounter of 04/17/21  Resp Panel by RT-PCR (Flu A&B, Covid) Nasopharyngeal Swab     Status: None   Collection Time: 04/17/21  3:37 PM   Specimen: Nasopharyngeal Swab; Nasopharyngeal(NP) swabs in vial transport medium  Result Value Ref Range Status   SARS Coronavirus  2 by RT PCR NEGATIVE NEGATIVE Final    Comment: (NOTE) SARS-CoV-2 target nucleic acids are NOT DETECTED.  The SARS-CoV-2 RNA is generally detectable in upper respiratory specimens during the acute phase of infection. The lowest concentration of SARS-CoV-2 viral copies this assay can detect is 138 copies/mL. A negative result does not preclude SARS-Cov-2 infection and should not be used as the sole basis for treatment or other patient management decisions. A negative result may occur with  improper specimen collection/handling, submission of specimen other than nasopharyngeal swab, presence of viral mutation(s) within the areas targeted by this assay, and inadequate number of viral copies(<138 copies/mL). A negative result must be combined with clinical observations, patient history, and epidemiological information. The expected result is Negative.  Fact Sheet for Patients:  BloggerCourse.com  Fact Sheet for Healthcare Providers:  SeriousBroker.it  This test is no t yet approved or cleared by the [place] FDA and  has been authorized for detection and/or diagnosis of SARS-CoV-2 by FDA under an Emergency Use Authorization (EUA). This EUA will  remain  in effect (meaning this test can be used) for the duration of the COVID-19 declaration under Section 564(b)(1) of the Act, 21 U.S.C.section 360bbb-3(b)(1), unless the authorization is terminated  or revoked sooner.       Influenza A by PCR NEGATIVE NEGATIVE Final   Influenza B by PCR NEGATIVE NEGATIVE Final    Comment: (NOTE) The Xpert Xpress SARS-CoV-2/FLU/RSV plus assay is intended as an aid in the diagnosis of influenza from Nasopharyngeal swab specimens and should not be used as a sole basis for treatment. Nasal washings and aspirates are unacceptable for Xpert Xpress SARS-CoV-2/FLU/RSV testing.  Fact Sheet for Patients: BloggerCourse.com  Fact Sheet for Healthcare Providers: SeriousBroker.it  This test is not yet approved or cleared by the [place] FDA and has been authorized for detection and/or diagnosis of SARS-CoV-2 by FDA under an Emergency Use Authorization (EUA). This EUA will remain in effect (meaning this test can be used) for the duration of the COVID-19 declaration under Section 564(b)(1) of the Act, 21 U.S.C. section 360bbb-3(b)(1), unless the authorization is terminated or revoked.  Performed at [facility], [address] [place], [place] [address]     Labs: CBC: Recent Labs  Lab 05/23/22 1020 05/24/22 0608  WBC 12.4* 10.6*  HGB 9.4* 7.7*  HCT 33.0* 26.6*  MCV 71.4* 72.1*  PLT 291 240   Basic Metabolic Panel: Recent Labs  Lab 05/23/22 1020 05/23/22 1343 05/24/22 0608  NA 133* 136 135  K 5.3* 4.1 3.7  CL 103 108 106  CO2 16* 19* 21*  GLUCOSE 426* 326* 288*  BUN 16 15 13   CREATININE 1.17* 0.97 1.04*  CALCIUM 8.8* 8.1* 8.3*  MG 1.9  --   --    Liver Function Tests: Recent Labs  Lab 05/23/22 1532  AST 14*  ALT 9  ALKPHOS 28*  BILITOT 0.4  PROT 3.8*  ALBUMIN 1.7*   CBG: Recent Labs  Lab 05/23/22 1726 05/23/22 2144 05/24/22 0739 05/24/22 1145  05/24/22 1539  GLUCAP 228* 112* 321* 321* 132*    Discharge time spent: greater than 30 minutes.  Signed: Enedina Finner, MD [facility] 05/24/2022

## 2022-05-24 NOTE — Progress Notes (Signed)
Progress Note  Patient Name: Jennifer [name] Date of Encounter: 05/24/2022  Primary Cardiologist: New - consult by Mariah Milling  Subjective   Status post DCCV in the ED on 05/23/2022, maintaining sinus rhythm.  High-sensitivity troponin mildly elevated and flat trending peaking at 352.  Hgb 7.7 this morning consistent with prior readings dating back to last fall with history of known menorrhagia with uterine fibroids.  She continues to note dull substernal chest pressure that is the same as what she was experiencing when she presented.  Pain is worse with movement and reproducible to palpation.  No palpitations or dyspnea.  Inpatient Medications    Scheduled Meds:  aspirin EC  81 mg Oral Daily   atorvastatin  40 mg Oral Daily   diltiazem  180 mg Oral Daily   ferrous sulfate  325 mg Oral BID   folic acid  1 mg Oral Daily   gabapentin  600 mg Oral TID   hydrALAZINE  25 mg Oral Q8H   hydrochlorothiazide  25 mg Oral Daily   insulin aspart  0-5 Units Subcutaneous QHS   insulin aspart  0-9 Units Subcutaneous TID WC   insulin aspart protamine- aspart  45 Units Subcutaneous TID WC   lisinopril  40 mg Oral Daily   nicotine  21 mg Transdermal Daily   Continuous Infusions:  heparin 1,050 Units/hr (05/24/22 0152)   PRN Meds: acetaminophen, cyclobenzaprine, hydrALAZINE, HYDROmorphone (DILAUDID) injection, nitroGLYCERIN, ondansetron (ZOFRAN) IV   Vital Signs    Vitals:   05/24/22 0500 05/24/22 0553 05/24/22 0610 05/24/22 0700  BP: 129/77 134/83    Pulse: 87     Resp: 19     Temp:   98.1 F (36.7 C) 99.3 F (37.4 C)  TempSrc:   Oral Oral  SpO2: 96%     Weight:      Height:       No intake or output data in the 24 hours ending 05/24/22 0846 Filed Weights   05/23/22 1607  Weight: 82.6 kg    Telemetry    Sinus rhythm with sinus bradycardia - Personally Reviewed  ECG    No new tracings - Personally Reviewed  Physical Exam   GEN: No acute distress.   Neck: No  JVD. Cardiac: RRR, no murmurs, rubs, or gallops.  Substernal chest pain is fully reproducible to palpation on exam. Respiratory: Mildly diminished breath sounds bilaterally along the bases.  GI: Soft, nontender, non-distended.   MS: No edema; No deformity. Neuro:  Alert and oriented x 3; Nonfocal.  Psych: Normal affect.  Labs    Chemistry Recent Labs  Lab 05/23/22 1020 05/23/22 1343 05/23/22 1532 05/24/22 0608  NA 133* 136  --  135  K 5.3* 4.1  --  3.7  CL 103 108  --  106  CO2 16* 19*  --  21*  GLUCOSE 426* 326*  --  288*  BUN 16 15  --  13  CREATININE 1.17* 0.97  --  1.04*  CALCIUM 8.8* 8.1*  --  8.3*  PROT  --   --  3.8*  --   ALBUMIN  --   --  1.7*  --   AST  --   --  14*  --   ALT  --   --  9  --   ALKPHOS  --   --  28*  --   BILITOT  --   --  0.4  --   GFRNONAA 59* >60  --  >60  ANIONGAP 14 9  --  8     Hematology Recent Labs  Lab 05/23/22 1020 05/24/22 0608  WBC 12.4* 10.6*  RBC 4.62 3.69*  HGB 9.4* 7.7*  HCT 33.0* 26.6*  MCV 71.4* 72.1*  MCH 20.3* 20.9*  MCHC 28.5* 28.9*  RDW 19.4* 18.7*  PLT 291 240    Cardiac EnzymesNo results for input(s): "TROPONINI" in the last 168 hours. No results for input(s): "TROPIPOC" in the last 168 hours.   BNPNo results for input(s): "BNP", "PROBNP" in the last 168 hours.   DDimer No results for input(s): "DDIMER" in the last 168 hours.   Radiology    DG Chest Portable 1 View  Result Date: 05/23/2022 IMPRESSION: No acute cardiopulmonary process. Electronically Signed   By: Wiliam Ke M.D.   On: 05/23/2022 11:26    Cardiac Studies   2D echo pending  Patient Profile     42 y.o. female with history of DM2, HTN, microcytic anemia previously requiring transfusion in the setting of menorrhagia with uterine fibroids, and obesity who is being seen today for the evaluation of new onset Afib with RVR at the request of Dr. Arnoldo Morale.   Assessment & Plan    1. New onset Afib with RVR: -Presented with sudden onset  tachypalpitations and chest pressure and was found to be in new onset Afib with RVR -After discussion with MD, patient had been in arrhythmia for less than 48 hours, she therefore underwent successful DCCV with conversion to sinus rhythm with one shock at 100 J -Electrolytes at goal -TSH normal -Started on Cardizem CD 180 mg -Obtain echo -CHADS2VASc 3 (HTN, DM, sex category), defer OAC after discussion with MD given brief onset of atrial arrhythmia and in the context of prior transfusion dependent anemia in the setting of ongoing uterine fibroids and menorrhagia -Plan for outpatient Zio patch to evaluate Afib burden   2.  Elevated high-sensitivity troponin with atypical chest pain: -Mildly elevated and flat trending, peaking at 352, down trending -Likely supply/demand ischemia in the setting of A-fib with RVR and recent cardioversion -Not consistent with ACS -Await echo for further risk stratification and consideration of ischemic testing -If echo is reassuring, would discontinue heparin drip given history of transfusion dependent anemia  3. HTN: -Well-controlled -Remains on Cardizem CD, hydralazine, HCTZ, and lisinopril  4.  Microcytic anemia with history of uterine fibroids and menorrhagia: -Hemoglobin low though at approximate baseline -Will need follow-up with PCP/GYN in the outpatient setting       For questions or updates, please contact [facility] Please consult [email or web address] for contact info under Cardiology/STEMI.    Signed, Eula Listen, PA-C [facility] Pager: [phone] 05/24/2022, 8:46 AM\

## 2022-05-24 NOTE — Consult Note (Addendum)
ANTICOAGULATION CONSULT NOTE  Pharmacy Consult for heparin infusion Indication: ACS/STEMI  Allergies  Allergen Reactions   Demerol [Meperidine] Hives, Itching and Nausea And Vomiting   Meloxicam Nausea And Vomiting   Morphine Other (See Comments)    Other Reaction: tolerates hydromorphone   Oxycodone Hives, Itching and Nausea And Vomiting   Toradol [Ketorolac Tromethamine] Hives, Itching and Nausea And Vomiting    Patient Measurements: Height:  (167.6 cm) Weight: 82.6 kg (182 lb) IBW/kg (Calculated) : 59.3 Heparin Dosing Weight: 76.7 kg  Vital Signs: Temp: 98.4 F (36.9 C) (04/19 1858) Temp Source: Oral (04/19 1858) BP: 151/77 (04/19 2230) Pulse Rate: 82 (04/19 2230)  Labs: Recent Labs    05/23/22 1020 05/23/22 1028 05/23/22 1228 05/23/22 1343 05/23/22 1527 05/23/22 1852 05/23/22 2248 05/24/22 0016  HGB 9.4*  --   --   --   --   --   --   --   HCT 33.0*  --   --   --   --   --   --   --   PLT 291  --   --   --   --   --   --   --   APTT  --   --   --   --   --  80*  --   --   LABPROT  --  12.9  --   --   --  15.7*  --   --   INR  --  1.0  --   --   --  1.3*  --   --   HEPARINUNFRC  --   --   --   --   --   --   --  0.22*  CREATININE 1.17*  --   --  0.97  --   --   --   --   TROPONINIHS 13  --    < > 116* 154* 319* 352*  --    < > = values in this interval not displayed.     Estimated Creatinine Clearance: 80.2 mL/min (by C-G formula based on SCr of 0.97 mg/dL).   Medical History: Past Medical History:  Diagnosis Date   Diabetes mellitus without complication    Hypertension     Medications:  No home anticoagulation per pharmacist review  Assessment: 42yo female presented to ED for chest pain.  Patient found to be in Afib and have elevated troponin.  Pharmacy consulted to start heparin infusion for ACS/STEMI.  Goal of Therapy:  Heparin level 0.3-0.7 units/ml Monitor platelets by anticoagulation protocol: Yes   0420 0016 HL 0.22,  subtherapeutic  Plan:  Bolus 1200 units x 1 Increase heparin infusion to 1050 units/hr Recheck HL in 6 hrs after rate change CBC daily while on heparin  Otelia Sergeant, PharmD, [facility] 05/24/2022 1:35 AM

## 2022-05-24 NOTE — Consult Note (Signed)
ANTICOAGULATION CONSULT NOTE  Pharmacy Consult for heparin infusion Indication: ACS/STEMI, Afib  Allergies  Allergen Reactions   Demerol [Meperidine] Hives, Itching and Nausea And Vomiting   Meloxicam Nausea And Vomiting   Morphine Other (See Comments)    Other Reaction: tolerates hydromorphone   Oxycodone Hives, Itching and Nausea And Vomiting   Toradol [Ketorolac Tromethamine] Hives, Itching and Nausea And Vomiting    Patient Measurements: Height:  (167.6 cm) Weight: 82.6 kg (182 lb) IBW/kg (Calculated) : 59.3 Heparin Dosing Weight: 76.7 kg  Vital Signs: Temp: 99.3 F (37.4 C) (04/20 0700) Temp Source: Oral (04/20 0700) BP: 121/77 (04/20 1000) Pulse Rate: 88 (04/20 1000)  Labs: Recent Labs    05/23/22 1020 05/23/22 1028 05/23/22 1228 05/23/22 1343 05/23/22 1527 05/23/22 1852 05/23/22 2248 05/24/22 0016 05/24/22 0608 05/24/22 0800  HGB 9.4*  --   --   --   --   --   --   --  7.7*  --   HCT 33.0*  --   --   --   --   --   --   --  26.6*  --   PLT 291  --   --   --   --   --   --   --  240  --   APTT  --   --   --   --   --  80*  --   --   --   --   LABPROT  --  12.9  --   --   --  15.7*  --   --   --   --   INR  --  1.0  --   --   --  1.3*  --   --   --   --   HEPARINUNFRC  --   --   --   --   --   --   --  0.22*  --  <0.10*  CREATININE 1.17*  --   --  0.97  --   --   --   --  1.04*  --   TROPONINIHS 13  --    < > 116*   < > 319* 352*  --  182*  --    < > = values in this interval not displayed.     Estimated Creatinine Clearance: 74.8 mL/min (A) (by C-G formula based on SCr of 1.04 mg/dL (H)).   Medical History: Past Medical History:  Diagnosis Date   Diabetes mellitus without complication    Hypertension     Medications:  No home anticoagulation per pharmacist review  Assessment: 42yo female presented to ED for chest pain.  Patient found to be in Afib and have elevated troponin.  Pharmacy consulted to start heparin infusion for  ACS/STEMI. --Afib- CHADS2VASc 3 (HTN, DM, sex category) defer OAC after discussion with MD given brief onset of atrial arrhythmia and in the context of prior transfusion dependent anemia in the setting of ongoing uterine fibroids and menorrhagia   Goal of Therapy:  Heparin level 0.3-0.7 units/ml Monitor platelets by anticoagulation protocol: Yes   0420 0016 HL 0.22, subtherapeutic 0420 0800 HL <0.10  subtherapeutic  inc 1050>1250 u/hr  Plan:  0420 0800 HL <0.10  subtherapeutic Bolus 2300 units x 1 Increase heparin infusion to 1250 units/hr Recheck HL in 6 hrs after rate change CBC daily while on heparin  Bari Mantis PharmD Clinical Pharmacist 05/24/2022

## 2022-05-24 NOTE — Progress Notes (Signed)
  Echocardiogram 2D Echocardiogram has been performed.  Lenor Coffin 05/24/2022, 2:52 PM

## 2022-05-25 LAB — LDL CHOLESTEROL, DIRECT: Direct LDL: 84 mg/dL (ref 0–99)

## 2022-05-26 ENCOUNTER — Encounter: Payer: Self-pay | Admitting: Emergency Medicine

## 2022-05-26 ENCOUNTER — Other Ambulatory Visit: Payer: Self-pay

## 2022-05-26 ENCOUNTER — Encounter: Payer: Self-pay | Admitting: Cardiovascular Disease

## 2022-05-26 ENCOUNTER — Emergency Department: Payer: Medicaid Other

## 2022-05-26 ENCOUNTER — Emergency Department
Admission: EM | Admit: 2022-05-26 | Discharge: 2022-05-26 | Discharge status: Against Medical Advice | Payer: Medicaid Other | Attending: Emergency Medicine | Admitting: Emergency Medicine

## 2022-05-26 DIAGNOSIS — Z5321 Procedure and treatment not carried out due to patient leaving prior to being seen by health care provider: Secondary | ICD-10-CM | POA: Diagnosis not present

## 2022-05-26 DIAGNOSIS — R079 Chest pain, unspecified: Secondary | ICD-10-CM | POA: Insufficient documentation

## 2022-05-26 DIAGNOSIS — R519 Headache, unspecified: Secondary | ICD-10-CM | POA: Diagnosis not present

## 2022-05-26 LAB — CBC
HCT: 31.8 % — ABNORMAL LOW (ref 36.0–46.0)
Hemoglobin: 8.9 g/dL — ABNORMAL LOW (ref 12.0–15.0)
MCH: 20.5 pg — ABNORMAL LOW (ref 26.0–34.0)
MCHC: 28 g/dL — ABNORMAL LOW (ref 30.0–36.0)
MCV: 73.1 fL — ABNORMAL LOW (ref 80.0–100.0)
Platelets: 276 10*3/uL (ref 150–400)
RBC: 4.35 MIL/uL (ref 3.87–5.11)
RDW: 19.4 % — ABNORMAL HIGH (ref 11.5–15.5)
WBC: 9.6 10*3/uL (ref 4.0–10.5)
nRBC: 0 % (ref 0.0–0.2)

## 2022-05-26 LAB — BASIC METABOLIC PANEL
Anion gap: 10 (ref 5–15)
BUN: 14 mg/dL (ref 6–20)
CO2: 24 mmol/L (ref 22–32)
Calcium: 8.9 mg/dL (ref 8.9–10.3)
Chloride: 100 mmol/L (ref 98–111)
Creatinine, Ser: 1.18 mg/dL — ABNORMAL HIGH (ref 0.44–1.00)
GFR, Estimated: 58 mL/min — ABNORMAL LOW (ref >60)
Glucose, Bld: 230 mg/dL — ABNORMAL HIGH (ref 70–99)
Potassium: 4 mmol/L (ref 3.5–5.1)
Sodium: 134 mmol/L — ABNORMAL LOW (ref 135–145)

## 2022-05-26 LAB — TROPONIN I (HIGH SENSITIVITY): Troponin I (High Sensitivity): 80 ng/L — ABNORMAL HIGH (ref <18)

## 2022-05-26 LAB — PROTIME-INR
INR: 1 (ref 0.8–1.2)
Prothrombin Time: 12.9 seconds (ref 11.4–15.2)

## 2022-05-26 NOTE — ED Notes (Signed)
Attempted to call patients cell phone. Room ready, but no answer from patient

## 2022-05-26 NOTE — Telephone Encounter (Signed)
Called the patient to discuss her MyChart message. She stated that she was discharged from the hospital on 4/20 and she feels like this was too soon. She stated that she is still having musculoskeletal pain that is not relieved with the Hydrocodone and that she has not had a bowel movement in over a week. She has completed her prescription of Hydrocodone that was given to her. She has been advised that this could make her lack of BM worse.   PER ED note: Patient was found to have new onset A-fib with RVR, heart rate up to 176.  Dr. Mariah Milling of cardiology is consulted.  Patient underwent successful cardioversion.  Currently patient is in sinus rhythm with heart rate 70s    New onset a fib with RVR-- now resolved in sinus rhythm after cardioversion -- patient currently in sinus rhythm -- was started on IV heparin drip not discontinued by Dr. Azucena Cecil -- on Cardizem CD1 80 mg daily -- echo no acute findings. -- No further cardiac evaluation recommended. Okay to discharge from cardiology standpoint  She is unable to check her blood pressure and heart rate at home but does feel like it is normal. She has been advised to reach out to her PCP concerning her lack of bowel movement but stated that she wants to go back to the ED.

## 2022-05-26 NOTE — ED Triage Notes (Signed)
Patient to ED via [facility] for Chest pain. Patient was here for same on Friday and cardioverted. Patient states pain has been ongoing in center of chest since. Also, c/o headache.

## 2022-05-27 NOTE — ED Notes (Signed)
Attempted to call patient regarding abnormal labs, no response. Left HIPAA compliant message.

## 2022-09-14 ENCOUNTER — Other Ambulatory Visit: Payer: Self-pay

## 2022-09-14 ENCOUNTER — Emergency Department
Admission: EM | Admit: 2022-09-14 | Discharge: 2022-09-14 | Disposition: A | Discharge status: Home / Self Care | Payer: Medicaid Other | Attending: Emergency Medicine | Admitting: Emergency Medicine

## 2022-09-14 DIAGNOSIS — R739 Hyperglycemia, unspecified: Secondary | ICD-10-CM | POA: Diagnosis present

## 2022-09-14 DIAGNOSIS — E1165 Type 2 diabetes mellitus with hyperglycemia: Secondary | ICD-10-CM | POA: Diagnosis not present

## 2022-09-14 DIAGNOSIS — Z794 Long term (current) use of insulin: Secondary | ICD-10-CM | POA: Insufficient documentation

## 2022-09-14 LAB — URINALYSIS, ROUTINE W REFLEX MICROSCOPIC
Bacteria, UA: NONE SEEN
Bilirubin Urine: NEGATIVE
Glucose, UA: >=500 mg/dL — AB
Hgb urine dipstick: NEGATIVE
Ketones, ur: NEGATIVE mg/dL
Leukocytes,Ua: NEGATIVE
Nitrite: NEGATIVE
Protein, ur: NEGATIVE mg/dL
Specific Gravity, Urine: 1.021 (ref 1.005–1.030)
pH: 5 (ref 5.0–8.0)

## 2022-09-14 LAB — POC URINE PREG, ED: Preg Test, Ur: NEGATIVE — NL

## 2022-09-14 LAB — CBC
HCT: 29.1 % — ABNORMAL LOW (ref 36.0–46.0)
Hemoglobin: 7.8 g/dL — ABNORMAL LOW (ref 12.0–15.0)
MCH: 18.4 pg — ABNORMAL LOW (ref 26.0–34.0)
MCHC: 26.8 g/dL — ABNORMAL LOW (ref 30.0–36.0)
MCV: 68.5 fL — ABNORMAL LOW (ref 80.0–100.0)
Platelets: 249 10*3/uL (ref 150–400)
RBC: 4.25 MIL/uL (ref 3.87–5.11)
RDW: 19.8 % — ABNORMAL HIGH (ref 11.5–15.5)
WBC: 8.4 10*3/uL (ref 4.0–10.5)
nRBC: 0 % (ref 0.0–0.2)

## 2022-09-14 LAB — BASIC METABOLIC PANEL
Anion gap: 12 (ref 5–15)
BUN: 19 mg/dL (ref 6–20)
CO2: 18 mmol/L — ABNORMAL LOW (ref 22–32)
Calcium: 8.6 mg/dL — ABNORMAL LOW (ref 8.9–10.3)
Chloride: 92 mmol/L — ABNORMAL LOW (ref 98–111)
Creatinine, Ser: 1.56 mg/dL — ABNORMAL HIGH (ref 0.44–1.00)
GFR, Estimated: 42 mL/min — ABNORMAL LOW (ref >60)
Glucose, Bld: 844 mg/dL (ref 70–99)
Potassium: 5.2 mmol/L — ABNORMAL HIGH (ref 3.5–5.1)
Sodium: 122 mmol/L — ABNORMAL LOW (ref 135–145)

## 2022-09-14 LAB — BLOOD GAS, VENOUS
Acid-base deficit: 2.8 mmol/L — ABNORMAL HIGH (ref 0.0–2.0)
Bicarbonate: 21.9 mmol/L (ref 20.0–28.0)
O2 Saturation: 92.6 %
Patient temperature: 37
pCO2, Ven: 37 mmHg — ABNORMAL LOW (ref 44–60)
pH, Ven: 7.38 (ref 7.25–7.43)
pO2, Ven: 59 mmHg — ABNORMAL HIGH (ref 32–45)

## 2022-09-14 LAB — CBG MONITORING, ED
Glucose-Capillary: >600 mg/dL (ref 70–99)
Glucose-Capillary: 481 mg/dL — ABNORMAL HIGH (ref 70–99)

## 2022-09-14 LAB — BETA-HYDROXYBUTYRIC ACID: Beta-Hydroxybutyric Acid: 0.35 mmol/L — ABNORMAL HIGH (ref 0.05–0.27)

## 2022-09-14 MED ORDER — SODIUM CHLORIDE 0.9 % IV BOLUS
1000.0000 mL | Freq: Once | INTRAVENOUS | Status: AC
  Administered 2022-09-14: 1000 mL via INTRAVENOUS

## 2022-09-14 MED ORDER — LACTATED RINGERS IV BOLUS
1000.0000 mL | Freq: Once | INTRAVENOUS | Status: AC
  Administered 2022-09-14: 1000 mL via INTRAVENOUS

## 2022-09-14 MED ORDER — INSULIN ASPART 100 UNIT/ML IJ SOLN
10.0000 [IU] | Freq: Once | INTRAMUSCULAR | Status: AC
  Administered 2022-09-14: 10 [IU] via INTRAVENOUS
  Filled 2022-09-14: qty 1

## 2022-09-14 MED ORDER — HALOPERIDOL LACTATE 5 MG/ML IJ SOLN
2.5000 mg | Freq: Once | INTRAMUSCULAR | Status: AC
  Administered 2022-09-14: 2.5 mg via INTRAVENOUS
  Filled 2022-09-14: qty 1

## 2022-09-14 NOTE — ED Provider Notes (Signed)
[facility] Provider Note    Event Date/Time   First MD Initiated Contact with Patient 09/14/22 1628     (approximate)   History   Hyperglycemia   HPI  Jennifer [name] is a 42 y.o. female who presents to the emergency department today because of concerns for elevated blood sugar.  She says that it has been elevated over the past few days.  She has been taking her diabetes medications.  She has had some nausea with this.  She has had fatigue.  Reminds her of when she has had DKA in the past.  No recent changes to her medications.  Denies any recent fevers chills or illness.     Physical Exam   Triage Vital Signs: ED Triage Vitals [09/14/22 1602]  Encounter Vitals Group     BP (!) 145/91     Systolic BP Percentile      Diastolic BP Percentile      Pulse Rate 98     Resp 16     Temp 98.3 F (36.8 C)     Temp Source Oral     SpO2 99 %     Weight 174 lb (78.9 kg)     Height 5\' 6"  (1.676 m)     Head Circumference      Peak Flow      Pain Score      Pain Loc      Pain Education      Exclude from Growth Chart     Most recent vital signs: Vitals:   09/14/22 1602  BP: (!) 145/91  Pulse: 98  Resp: 16  Temp: 98.3 F (36.8 C)  SpO2: 99%   General: Awake, alert, oriented. CV:  Good peripheral perfusion. Regular rate and rhythm. Resp:  Normal effort. Lungs clear. Abd:  No distention.    ED Results / Procedures / Treatments   Labs (all labs ordered are listed, but only abnormal results are displayed) Labs Reviewed  BASIC METABOLIC PANEL - Abnormal; Notable for the following components:      Result Value   Sodium 122 (*)    Potassium 5.2 (*)    Chloride 92 (*)    CO2 18 (*)    Glucose, Bld 844 (*)    Creatinine, Ser 1.56 (*)    Calcium 8.6 (*)    GFR, Estimated 42 (*)    All other components within normal limits  CBC - Abnormal; Notable for the following components:   Hemoglobin 7.8 (*)    HCT 29.1 (*)    MCV 68.5 (*)    MCH 18.4  (*)    MCHC 26.8 (*)    RDW 19.8 (*)    All other components within normal limits  URINALYSIS, ROUTINE W REFLEX MICROSCOPIC - Abnormal; Notable for the following components:   Color, Urine COLORLESS (*)    APPearance CLEAR (*)    Glucose, UA >=500 (*)    All other components within normal limits  BLOOD GAS, VENOUS - Abnormal; Notable for the following components:   pCO2, Ven 37 (*)    pO2, Ven 59 (*)    Acid-base deficit 2.8 (*)    All other components within normal limits  BETA-HYDROXYBUTYRIC ACID - Abnormal; Notable for the following components:   Beta-Hydroxybutyric Acid 0.35 (*)    All other components within normal limits  CBG MONITORING, ED - Abnormal; Notable for the following components:   Glucose-Capillary >600 (*)    All other  components within normal limits  CBG MONITORING, ED - Abnormal; Notable for the following components:   Glucose-Capillary 481 (*)    All other components within normal limits  POC URINE PREG, ED     EKG  I, Phineas Semen, attending physician, personally viewed and interpreted this EKG  EKG Time: 1709 Rate: 93 Rhythm: sinus rhythm Axis: normal Intervals: qtc 472 QRS: q waves V1, v2 ST changes: no st elevation Impression: abnormal ekg   RADIOLOGY None   PROCEDURES:  Critical Care performed: No    MEDICATIONS ORDERED IN ED: Medications - No data to display   IMPRESSION / MDM / ASSESSMENT AND PLAN / ED COURSE  I reviewed the triage vital signs and the nursing notes.                              Differential diagnosis includes, but is not limited to, DKA, hyperglycemia, infection  Patient's presentation is most consistent with acute presentation with potential threat to life or bodily function.   The patient is on the cardiac monitor to evaluate for evidence of arrhythmia and/or significant heart rate changes.  Patient presented to the emergency department today with concerns for elevated blood sugar.  Workup today  without findings concerning for DKA.  Patient does have elevated sugar but no anion gap.  pH within normal limits.  Patient's blood sugar did start to improve after IV fluids and insulin.  Patient stated she felt better and requested discharge home.  At this time I think this is reasonable.  Patient says that she has appointment with her doctor tomorrow.      FINAL CLINICAL IMPRESSION(S) / ED DIAGNOSES   Final diagnoses:  Hyperglycemia    Note:  This document was prepared using Dragon voice recognition software and may include unintentional dictation errors.    Phineas Semen, MD 09/14/22 2039

## 2022-09-14 NOTE — ED Triage Notes (Signed)
Pt co of a "high" BG reading at home. Pt also co of generalized body aches and heart burn. Pt denies N/v/d. Pt is ambulatory on arrival. Pt is A&Ox4.

## 2022-09-14 NOTE — Discharge Instructions (Signed)
Please seek medical attention for any high fevers, chest pain, shortness of breath, change in behavior, persistent vomiting, bloody stool or any other new or concerning symptoms.  

## 2023-01-15 ENCOUNTER — Inpatient Hospital Stay
Admission: EM | Admit: 2023-01-15 | Discharge: 2023-01-23 | DRG: 271 | Disposition: A | Discharge status: Rehabilitation Facility | Payer: Medicaid Other | Attending: Obstetrics and Gynecology | Admitting: Obstetrics and Gynecology

## 2023-01-15 ENCOUNTER — Encounter
Admission: EM | Disposition: A | Discharge status: Rehabilitation Facility | Payer: Self-pay | Source: Home / Self Care | Attending: Obstetrics and Gynecology

## 2023-01-15 ENCOUNTER — Encounter: Payer: Self-pay | Admitting: Emergency Medicine

## 2023-01-15 ENCOUNTER — Other Ambulatory Visit: Payer: Self-pay

## 2023-01-15 DIAGNOSIS — E669 Obesity, unspecified: Secondary | ICD-10-CM | POA: Diagnosis present

## 2023-01-15 DIAGNOSIS — I998 Other disorder of circulatory system: Principal | ICD-10-CM

## 2023-01-15 DIAGNOSIS — Z79899 Other long term (current) drug therapy: Secondary | ICD-10-CM

## 2023-01-15 DIAGNOSIS — E1059 Type 1 diabetes mellitus with other circulatory complications: Secondary | ICD-10-CM | POA: Diagnosis not present

## 2023-01-15 DIAGNOSIS — Z794 Long term (current) use of insulin: Secondary | ICD-10-CM

## 2023-01-15 DIAGNOSIS — Z7901 Long term (current) use of anticoagulants: Secondary | ICD-10-CM | POA: Diagnosis not present

## 2023-01-15 DIAGNOSIS — F17210 Nicotine dependence, cigarettes, uncomplicated: Secondary | ICD-10-CM | POA: Diagnosis present

## 2023-01-15 DIAGNOSIS — E875 Hyperkalemia: Secondary | ICD-10-CM | POA: Diagnosis not present

## 2023-01-15 DIAGNOSIS — N179 Acute kidney failure, unspecified: Secondary | ICD-10-CM | POA: Diagnosis not present

## 2023-01-15 DIAGNOSIS — I743 Embolism and thrombosis of arteries of the lower extremities: Principal | ICD-10-CM | POA: Diagnosis present

## 2023-01-15 DIAGNOSIS — T82898A Other specified complication of vascular prosthetic devices, implants and grafts, initial encounter: Secondary | ICD-10-CM | POA: Diagnosis not present

## 2023-01-15 DIAGNOSIS — Z8249 Family history of ischemic heart disease and other diseases of the circulatory system: Secondary | ICD-10-CM | POA: Diagnosis not present

## 2023-01-15 DIAGNOSIS — N1831 Chronic kidney disease, stage 3a: Secondary | ICD-10-CM | POA: Diagnosis not present

## 2023-01-15 DIAGNOSIS — R230 Cyanosis: Secondary | ICD-10-CM | POA: Diagnosis present

## 2023-01-15 DIAGNOSIS — S88111A Complete traumatic amputation at level between knee and ankle, right lower leg, initial encounter: Secondary | ICD-10-CM | POA: Diagnosis not present

## 2023-01-15 DIAGNOSIS — K5900 Constipation, unspecified: Secondary | ICD-10-CM | POA: Diagnosis not present

## 2023-01-15 DIAGNOSIS — I4891 Unspecified atrial fibrillation: Secondary | ICD-10-CM | POA: Diagnosis not present

## 2023-01-15 DIAGNOSIS — T8789 Other complications of amputation stump: Secondary | ICD-10-CM | POA: Diagnosis not present

## 2023-01-15 DIAGNOSIS — Z888 Allergy status to other drugs, medicaments and biological substances status: Secondary | ICD-10-CM

## 2023-01-15 DIAGNOSIS — E785 Hyperlipidemia, unspecified: Secondary | ICD-10-CM | POA: Diagnosis present

## 2023-01-15 DIAGNOSIS — I480 Paroxysmal atrial fibrillation: Secondary | ICD-10-CM | POA: Diagnosis present

## 2023-01-15 DIAGNOSIS — M79604 Pain in right leg: Secondary | ICD-10-CM | POA: Diagnosis not present

## 2023-01-15 DIAGNOSIS — G546 Phantom limb syndrome with pain: Secondary | ICD-10-CM | POA: Diagnosis not present

## 2023-01-15 DIAGNOSIS — D649 Anemia, unspecified: Secondary | ICD-10-CM | POA: Diagnosis present

## 2023-01-15 DIAGNOSIS — Z6834 Body mass index (BMI) 34.0-34.9, adult: Secondary | ICD-10-CM | POA: Diagnosis not present

## 2023-01-15 DIAGNOSIS — D509 Iron deficiency anemia, unspecified: Secondary | ICD-10-CM | POA: Diagnosis present

## 2023-01-15 DIAGNOSIS — Z833 Family history of diabetes mellitus: Secondary | ICD-10-CM

## 2023-01-15 DIAGNOSIS — N189 Chronic kidney disease, unspecified: Secondary | ICD-10-CM | POA: Diagnosis not present

## 2023-01-15 DIAGNOSIS — Z7984 Long term (current) use of oral hypoglycemic drugs: Secondary | ICD-10-CM

## 2023-01-15 DIAGNOSIS — Y831 Surgical operation with implant of artificial internal device as the cause of abnormal reaction of the patient, or of later complication, without mention of misadventure at the time of the procedure: Secondary | ICD-10-CM | POA: Diagnosis not present

## 2023-01-15 DIAGNOSIS — I70261 Atherosclerosis of native arteries of extremities with gangrene, right leg: Secondary | ICD-10-CM | POA: Diagnosis present

## 2023-01-15 DIAGNOSIS — G8918 Other acute postprocedural pain: Secondary | ICD-10-CM | POA: Diagnosis not present

## 2023-01-15 DIAGNOSIS — Z886 Allergy status to analgesic agent status: Secondary | ICD-10-CM

## 2023-01-15 DIAGNOSIS — E11649 Type 2 diabetes mellitus with hypoglycemia without coma: Secondary | ICD-10-CM | POA: Diagnosis not present

## 2023-01-15 DIAGNOSIS — E66811 Obesity, class 1: Secondary | ICD-10-CM | POA: Diagnosis present

## 2023-01-15 DIAGNOSIS — D62 Acute posthemorrhagic anemia: Secondary | ICD-10-CM | POA: Diagnosis not present

## 2023-01-15 DIAGNOSIS — E871 Hypo-osmolality and hyponatremia: Secondary | ICD-10-CM | POA: Diagnosis not present

## 2023-01-15 DIAGNOSIS — E1142 Type 2 diabetes mellitus with diabetic polyneuropathy: Secondary | ICD-10-CM | POA: Diagnosis present

## 2023-01-15 DIAGNOSIS — Z9189 Other specified personal risk factors, not elsewhere classified: Secondary | ICD-10-CM | POA: Diagnosis not present

## 2023-01-15 DIAGNOSIS — I10 Essential (primary) hypertension: Secondary | ICD-10-CM | POA: Diagnosis present

## 2023-01-15 DIAGNOSIS — E1165 Type 2 diabetes mellitus with hyperglycemia: Secondary | ICD-10-CM | POA: Diagnosis present

## 2023-01-15 DIAGNOSIS — N1832 Chronic kidney disease, stage 3b: Secondary | ICD-10-CM | POA: Diagnosis not present

## 2023-01-15 DIAGNOSIS — E1152 Type 2 diabetes mellitus with diabetic peripheral angiopathy with gangrene: Principal | ICD-10-CM | POA: Diagnosis present

## 2023-01-15 DIAGNOSIS — I70221 Atherosclerosis of native arteries of extremities with rest pain, right leg: Secondary | ICD-10-CM | POA: Diagnosis present

## 2023-01-15 DIAGNOSIS — S88112S Complete traumatic amputation at level between knee and ankle, left lower leg, sequela: Secondary | ICD-10-CM | POA: Diagnosis not present

## 2023-01-15 DIAGNOSIS — Z885 Allergy status to narcotic agent status: Secondary | ICD-10-CM

## 2023-01-15 DIAGNOSIS — M62838 Other muscle spasm: Secondary | ICD-10-CM | POA: Diagnosis present

## 2023-01-15 HISTORY — DX: Excessive and frequent menstruation with regular cycle: N92.0

## 2023-01-15 HISTORY — DX: Abnormal uterine and vaginal bleeding, unspecified: N93.9

## 2023-01-15 HISTORY — DX: Obstructive sleep apnea (adult) (pediatric): G47.33

## 2023-01-15 HISTORY — PX: LOWER EXTREMITY ANGIOGRAPHY: CATH118251

## 2023-01-15 HISTORY — DX: Headache, unspecified: R51.9

## 2023-01-15 HISTORY — DX: Other disorder of circulatory system: I99.8

## 2023-01-15 LAB — GLUCOSE, CAPILLARY
Glucose-Capillary: 326 mg/dL — ABNORMAL HIGH (ref 70–99)
Glucose-Capillary: 316 mg/dL — ABNORMAL HIGH (ref 70–99)
Glucose-Capillary: 300 mg/dL — ABNORMAL HIGH (ref 70–99)
Glucose-Capillary: 298 mg/dL — ABNORMAL HIGH (ref 70–99)

## 2023-01-15 LAB — CBC WITH DIFFERENTIAL/PLATELET
Abs Immature Granulocytes: 0.04 10*3/uL (ref 0.00–0.07)
Basophils Absolute: 0 10*3/uL (ref 0.0–0.1)
Basophils Relative: 0 %
Eosinophils Absolute: 0.3 10*3/uL (ref 0.0–0.5)
Eosinophils Relative: 3 %
HCT: 28.6 % — ABNORMAL LOW (ref 36.0–46.0)
Hemoglobin: 7.5 g/dL — ABNORMAL LOW (ref 12.0–15.0)
Immature Granulocytes: 0 %
Lymphocytes Relative: 17 %
Lymphs Abs: 2.1 10*3/uL (ref 0.7–4.0)
MCH: 16.7 pg — ABNORMAL LOW (ref 26.0–34.0)
MCHC: 26.2 g/dL — ABNORMAL LOW (ref 30.0–36.0)
MCV: 63.7 fL — ABNORMAL LOW (ref 80.0–100.0)
Monocytes Absolute: 0.8 10*3/uL (ref 0.1–1.0)
Monocytes Relative: 7 %
Neutro Abs: 8.8 10*3/uL — ABNORMAL HIGH (ref 1.7–7.7)
Neutrophils Relative %: 73 %
Platelets: 532 10*3/uL — ABNORMAL HIGH (ref 150–400)
RBC: 4.49 MIL/uL (ref 3.87–5.11)
RDW: 24.1 % — ABNORMAL HIGH (ref 11.5–15.5)
Smear Review: NORMAL
WBC: 12.1 10*3/uL — ABNORMAL HIGH (ref 4.0–10.5)
nRBC: 0 % (ref 0.0–0.2)

## 2023-01-15 LAB — IRON AND TIBC
Iron: 13 ug/dL — ABNORMAL LOW (ref 28–170)
Saturation Ratios: 3 % — ABNORMAL LOW (ref 10.4–31.8)
TIBC: 424 ug/dL (ref 250–450)
UIBC: 411 ug/dL

## 2023-01-15 LAB — RETICULOCYTES
Immature Retic Fract: 22 % — ABNORMAL HIGH (ref 2.3–15.9)
RBC.: 4.41 MIL/uL (ref 3.87–5.11)
Retic Count, Absolute: 115.5 10*3/uL (ref 19.0–186.0)
Retic Ct Pct: 2.6 % (ref 0.4–3.1)

## 2023-01-15 LAB — COMPREHENSIVE METABOLIC PANEL
ALT: 12 U/L (ref 0–44)
AST: 16 U/L (ref 15–41)
Albumin: 3.5 g/dL (ref 3.5–5.0)
Alkaline Phosphatase: 70 U/L (ref 38–126)
Anion gap: 10 (ref 5–15)
BUN: 14 mg/dL (ref 6–20)
CO2: 20 mmol/L — ABNORMAL LOW (ref 22–32)
Calcium: 9.1 mg/dL (ref 8.9–10.3)
Chloride: 97 mmol/L — ABNORMAL LOW (ref 98–111)
Creatinine, Ser: 1.33 mg/dL — ABNORMAL HIGH (ref 0.44–1.00)
GFR, Estimated: 51 mL/min — ABNORMAL LOW (ref >60)
Glucose, Bld: 602 mg/dL (ref 70–99)
Potassium: 4.3 mmol/L (ref 3.5–5.1)
Sodium: 127 mmol/L — ABNORMAL LOW (ref 135–145)
Total Bilirubin: 0.3 mg/dL (ref <1.2)
Total Protein: 8 g/dL (ref 6.5–8.1)

## 2023-01-15 LAB — CBG MONITORING, ED: Glucose-Capillary: 342 mg/dL — ABNORMAL HIGH (ref 70–99)

## 2023-01-15 LAB — LACTIC ACID, PLASMA
Lactic Acid, Venous: 2.8 mmol/L (ref 0.5–1.9)
Lactic Acid, Venous: 1.9 mmol/L (ref 0.5–1.9)

## 2023-01-15 LAB — PROTIME-INR
INR: 1 (ref 0.8–1.2)
Prothrombin Time: 13.5 s (ref 11.4–15.2)

## 2023-01-15 LAB — APTT: aPTT: 27 s (ref 24–36)

## 2023-01-15 LAB — FERRITIN: Ferritin: 5 ng/mL — ABNORMAL LOW (ref 11–307)

## 2023-01-15 LAB — MRSA NEXT GEN BY PCR, NASAL: MRSA by PCR Next Gen: NOT DETECTED

## 2023-01-15 SURGERY — LOWER EXTREMITY ANGIOGRAPHY
Anesthesia: Moderate Sedation | Laterality: Right

## 2023-01-15 MED ORDER — INSULIN ASPART 100 UNIT/ML IJ SOLN: 5.0000 [IU] | Freq: Once | INTRAMUSCULAR | Status: AC

## 2023-01-15 MED ORDER — INSULIN ASPART 100 UNIT/ML IJ SOLN
0.0000 [IU] | Freq: Every day | INTRAMUSCULAR | Status: DC
  Administered 2023-01-15: 3 [IU] via SUBCUTANEOUS
  Administered 2023-01-16 – 2023-01-17 (×2): 4 [IU] via SUBCUTANEOUS
  Administered 2023-01-18: 5 [IU] via SUBCUTANEOUS
  Administered 2023-01-19: 3 [IU] via SUBCUTANEOUS
  Administered 2023-01-20: 2 [IU] via SUBCUTANEOUS
  Filled 2023-01-15 (×6): qty 1

## 2023-01-15 MED ORDER — CEFAZOLIN SODIUM-DEXTROSE 2-4 GM/100ML-% IV SOLN
2.0000 g | INTRAVENOUS | Status: AC
  Administered 2023-01-15: 2 g via INTRAVENOUS
  Filled 2023-01-15: qty 100

## 2023-01-15 MED ORDER — HEPARIN (PORCINE) 25000 UT/250ML-% IV SOLN
1300.0000 [IU]/h | INTRAVENOUS | Status: DC
  Administered 2023-01-15: 1300 [IU]/h via INTRAVENOUS
  Filled 2023-01-15: qty 250

## 2023-01-15 MED ORDER — HEPARIN SODIUM (PORCINE) 1000 UNIT/ML IJ SOLN
INTRAMUSCULAR | Status: DC | PRN
  Administered 2023-01-15: 5000 [IU] via INTRAVENOUS

## 2023-01-15 MED ORDER — NITROGLYCERIN 1 MG/10 ML FOR IR/CATH LAB
INTRA_ARTERIAL | Status: DC | PRN
  Administered 2023-01-15: 400 ug via INTRA_ARTERIAL

## 2023-01-15 MED ORDER — ONDANSETRON HCL 4 MG/2ML IJ SOLN
INTRAMUSCULAR | Status: AC
  Administered 2023-01-15: 4 mg via INTRAVENOUS
  Filled 2023-01-15: qty 2

## 2023-01-15 MED ORDER — TIROFIBAN HCL IN NACL 5-0.9 MG/100ML-% IV SOLN
0.1500 ug/kg/min | INTRAVENOUS | Status: AC
  Administered 2023-01-15 – 2023-01-16 (×3): 0.15 ug/kg/min via INTRAVENOUS
  Filled 2023-01-15 (×3): qty 100

## 2023-01-15 MED ORDER — LIDOCAINE-EPINEPHRINE (PF) 1 %-1:200000 IJ SOLN
INTRAMUSCULAR | Status: DC | PRN
  Administered 2023-01-15: 10 mL

## 2023-01-15 MED ORDER — INSULIN ASPART 100 UNIT/ML IJ SOLN
INTRAMUSCULAR | Status: AC
  Administered 2023-01-15: 5 [IU] via SUBCUTANEOUS
  Filled 2023-01-15: qty 1

## 2023-01-15 MED ORDER — METHYLPREDNISOLONE SODIUM SUCC 125 MG IJ SOLR: 125.0000 mg | Freq: Once | INTRAMUSCULAR | Status: DC | PRN

## 2023-01-15 MED ORDER — FENTANYL CITRATE (PF) 100 MCG/2ML IJ SOLN
INTRAMUSCULAR | Status: DC | PRN
  Administered 2023-01-15: 50 ug via INTRAVENOUS

## 2023-01-15 MED ORDER — LABETALOL HCL 5 MG/ML IV SOLN
INTRAVENOUS | Status: DC | PRN
  Administered 2023-01-15: 20 mg via INTRAVENOUS

## 2023-01-15 MED ORDER — IODIXANOL 320 MG/ML IV SOLN
INTRAVENOUS | Status: DC | PRN
  Administered 2023-01-15: 45 mL via INTRA_ARTERIAL

## 2023-01-15 MED ORDER — HYDROMORPHONE HCL 1 MG/ML IJ SOLN
INTRAMUSCULAR | Status: AC
  Administered 2023-01-15: 1 mg via INTRAVENOUS
  Filled 2023-01-15: qty 1

## 2023-01-15 MED ORDER — CYCLOBENZAPRINE HCL 10 MG PO TABS: 10.0000 mg | ORAL_TABLET | Freq: Every day | ORAL | Status: DC | PRN

## 2023-01-15 MED ORDER — NICOTINE 21 MG/24HR TD PT24
21.0000 mg | MEDICATED_PATCH | Freq: Every day | TRANSDERMAL | Status: DC
  Administered 2023-01-19: 21 mg via TRANSDERMAL
  Filled 2023-01-15 (×6): qty 1

## 2023-01-15 MED ORDER — INSULIN ASPART 100 UNIT/ML IJ SOLN
10.0000 [IU] | Freq: Once | INTRAMUSCULAR | Status: AC
  Administered 2023-01-15: 10 [IU] via INTRAVENOUS
  Filled 2023-01-15: qty 1

## 2023-01-15 MED ORDER — HYDROMORPHONE HCL 1 MG/ML IJ SOLN
1.0000 mg | Freq: Once | INTRAMUSCULAR | Status: AC
  Administered 2023-01-15: 1 mg via INTRAVENOUS
  Filled 2023-01-15: qty 1

## 2023-01-15 MED ORDER — FENTANYL CITRATE (PF) 100 MCG/2ML IJ SOLN
INTRAMUSCULAR | Status: AC
  Filled 2023-01-15: qty 2

## 2023-01-15 MED ORDER — TIROFIBAN (AGGRASTAT) BOLUS VIA INFUSION
25.0000 ug/kg | Freq: Once | INTRAVENOUS | Status: AC
Start: 2023-01-15 — End: 2023-01-15
  Administered 2023-01-15: 2415 ug via INTRAVENOUS

## 2023-01-15 MED ORDER — MIDAZOLAM HCL 5 MG/5ML IJ SOLN
INTRAMUSCULAR | Status: AC
Start: 2023-01-15 — End: ?
  Filled 2023-01-15: qty 5

## 2023-01-15 MED ORDER — SODIUM CHLORIDE 0.9 % IV BOLUS
1000.0000 mL | Freq: Once | INTRAVENOUS | Status: AC
  Administered 2023-01-15: 1000 mL via INTRAVENOUS

## 2023-01-15 MED ORDER — HEPARIN SODIUM (PORCINE) 1000 UNIT/ML IJ SOLN
INTRAMUSCULAR | Status: AC
  Filled 2023-01-15: qty 10

## 2023-01-15 MED ORDER — SODIUM CHLORIDE 0.9 % IV SOLN
8.0000 mg | Freq: Four times a day (QID) | INTRAVENOUS | Status: DC | PRN
  Administered 2023-01-16 – 2023-01-17 (×2): 8 mg via INTRAVENOUS
  Filled 2023-01-15 (×2): qty 4

## 2023-01-15 MED ORDER — ONDANSETRON HCL 4 MG/2ML IJ SOLN: 4.0000 mg | Freq: Once | INTRAMUSCULAR | Status: AC

## 2023-01-15 MED ORDER — CEFAZOLIN SODIUM-DEXTROSE 2-4 GM/100ML-% IV SOLN
INTRAVENOUS | Status: AC
  Filled 2023-01-15: qty 100

## 2023-01-15 MED ORDER — MIDAZOLAM HCL 2 MG/2ML IJ SOLN
INTRAMUSCULAR | Status: DC | PRN
  Administered 2023-01-15: 2 mg via INTRAVENOUS

## 2023-01-15 MED ORDER — MIDAZOLAM HCL 2 MG/ML PO SYRP
8.0000 mg | ORAL_SOLUTION | Freq: Once | ORAL | Status: DC | PRN
  Filled 2023-01-15: qty 5

## 2023-01-15 MED ORDER — INSULIN ASPART PROT & ASPART (70-30 MIX) 100 UNIT/ML PEN: 45.0000 [IU] | PEN_INJECTOR | Freq: Two times a day (BID) | SUBCUTANEOUS | Status: DC

## 2023-01-15 MED ORDER — HEPARIN BOLUS VIA INFUSION
5000.0000 [IU] | Freq: Once | INTRAVENOUS | Status: AC
  Administered 2023-01-15: 5000 [IU] via INTRAVENOUS
  Filled 2023-01-15: qty 5000

## 2023-01-15 MED ORDER — HYDROCODONE-ACETAMINOPHEN 5-325 MG PO TABS
1.0000 | ORAL_TABLET | Freq: Three times a day (TID) | ORAL | Status: DC | PRN
  Administered 2023-01-15 – 2023-01-16 (×2): 1 via ORAL
  Filled 2023-01-15 (×3): qty 1

## 2023-01-15 MED ORDER — DILTIAZEM HCL 30 MG PO TABS
30.0000 mg | ORAL_TABLET | Freq: Four times a day (QID) | ORAL | Status: DC
  Administered 2023-01-16 – 2023-01-17 (×4): 30 mg via ORAL
  Filled 2023-01-15 (×6): qty 1

## 2023-01-15 MED ORDER — GABAPENTIN 300 MG PO CAPS
600.0000 mg | ORAL_CAPSULE | Freq: Three times a day (TID) | ORAL | Status: DC
  Administered 2023-01-15 – 2023-01-16 (×2): 600 mg via ORAL
  Filled 2023-01-15 (×2): qty 2

## 2023-01-15 MED ORDER — HYDROCHLOROTHIAZIDE 25 MG PO TABS
25.0000 mg | ORAL_TABLET | Freq: Every day | ORAL | Status: DC
Start: 2023-01-15 — End: 2023-01-15

## 2023-01-15 MED ORDER — HYDROMORPHONE HCL 1 MG/ML IJ SOLN
1.0000 mg | INTRAMUSCULAR | Status: DC | PRN
  Administered 2023-01-15 – 2023-01-17 (×17): 1 mg via INTRAVENOUS
  Filled 2023-01-15 (×18): qty 1

## 2023-01-15 MED ORDER — LISINOPRIL 10 MG PO TABS: 40.0000 mg | ORAL_TABLET | Freq: Every day | ORAL | Status: DC

## 2023-01-15 MED ORDER — HEPARIN (PORCINE) IN NACL 2000-0.9 UNIT/L-% IV SOLN
INTRAVENOUS | Status: DC | PRN
  Administered 2023-01-15: 1000 mL

## 2023-01-15 MED ORDER — INSULIN ASPART 100 UNIT/ML IJ SOLN
0.0000 [IU] | Freq: Three times a day (TID) | INTRAMUSCULAR | Status: DC
  Administered 2023-01-15: 11 [IU] via SUBCUTANEOUS
  Administered 2023-01-16: 15 [IU] via SUBCUTANEOUS
  Administered 2023-01-16 (×2): 20 [IU] via SUBCUTANEOUS
  Administered 2023-01-17 (×2): 11 [IU] via SUBCUTANEOUS
  Administered 2023-01-18 (×3): 20 [IU] via SUBCUTANEOUS
  Administered 2023-01-19: 7 [IU] via SUBCUTANEOUS
  Administered 2023-01-19 (×2): 20 [IU] via SUBCUTANEOUS
  Administered 2023-01-20 (×2): 7 [IU] via SUBCUTANEOUS
  Administered 2023-01-20: 20 [IU] via SUBCUTANEOUS
  Administered 2023-01-21: 3 [IU] via SUBCUTANEOUS
  Administered 2023-01-21: 15 [IU] via SUBCUTANEOUS
  Administered 2023-01-21: 4 [IU] via SUBCUTANEOUS
  Administered 2023-01-22: 7 [IU] via SUBCUTANEOUS
  Administered 2023-01-22 – 2023-01-23 (×3): 4 [IU] via SUBCUTANEOUS
  Filled 2023-01-15 (×21): qty 1

## 2023-01-15 MED ORDER — SODIUM CHLORIDE 0.9 % IV SOLN: INTRAVENOUS | Status: DC

## 2023-01-15 MED ORDER — DIPHENHYDRAMINE HCL 50 MG/ML IJ SOLN: 50.0000 mg | Freq: Once | INTRAMUSCULAR | Status: DC | PRN

## 2023-01-15 MED ORDER — HYDRALAZINE HCL 50 MG PO TABS: 25.0000 mg | ORAL_TABLET | Freq: Three times a day (TID) | ORAL | Status: DC

## 2023-01-15 MED ORDER — LABETALOL HCL 5 MG/ML IV SOLN
INTRAVENOUS | Status: AC
  Filled 2023-01-15: qty 4

## 2023-01-15 MED ORDER — ATORVASTATIN CALCIUM 20 MG PO TABS: 20.0000 mg | ORAL_TABLET | Freq: Every day | ORAL | Status: DC

## 2023-01-15 MED ORDER — TIROFIBAN HCL IN NACL 5-0.9 MG/100ML-% IV SOLN
INTRAVENOUS | Status: AC
  Administered 2023-01-15: 0.15 ug/kg/min via INTRAVENOUS
  Filled 2023-01-15: qty 100

## 2023-01-15 MED ORDER — FAMOTIDINE 20 MG PO TABS: 40.0000 mg | ORAL_TABLET | Freq: Once | ORAL | Status: DC | PRN

## 2023-01-15 SURGICAL SUPPLY — 19 items
BALLN LUTONIX 018 5X150X130 (BALLOONS) ×1
BALLOON LUTONIX 018 5X150X130 (BALLOONS) IMPLANT
CANISTER PENUMBRA ENGINE (MISCELLANEOUS) IMPLANT
CATH ANGIO 5F PIGTAIL 65CM (CATHETERS) IMPLANT
CATH INDIGO CAT6 KIT (CATHETERS) IMPLANT
CATH VERT 5FR 125CM (CATHETERS) IMPLANT
CATH VERT 5X100 (CATHETERS) IMPLANT
COVER PROBE ULTRASOUND 5X96 (MISCELLANEOUS) IMPLANT
DEVICE PRESTO INFLATION (MISCELLANEOUS) IMPLANT
DEVICE STARCLOSE SE CLOSURE (Vascular Products) IMPLANT
GLIDEWIRE ADV .035X260CM (WIRE) IMPLANT
PACK ANGIOGRAPHY (CUSTOM PROCEDURE TRAY) ×2 IMPLANT
SHEATH BRITE TIP 5FRX11 (SHEATH) IMPLANT
SHEATH RAABE 6FRX70 (SHEATH) IMPLANT
STENT VIABAHN 6X150X120 (Permanent Stent) IMPLANT
SYR MEDRAD MARK 7 150ML (SYRINGE) IMPLANT
TUBING CONTRAST HIGH PRESS 72 (TUBING) IMPLANT
WIRE G V18X300CM (WIRE) IMPLANT
WIRE GUIDERIGHT .035X150 (WIRE) IMPLANT

## 2023-01-15 NOTE — Consult Note (Signed)
PHARMACY - ANTICOAGULATION CONSULT NOTE  Pharmacy Consult for heparin infusion Indication: arterial occlusion  Allergies  Allergen Reactions   Demerol [Meperidine] Hives, Itching and Nausea And Vomiting   Meloxicam Nausea And Vomiting   Morphine Other (See Comments)    Other Reaction: tolerates hydromorphone   Oxycodone Hives, Itching and Nausea And Vomiting   Toradol [Ketorolac Tromethamine] Hives, Itching and Nausea And Vomiting    Patient Measurements: Height: 5\' 6"  (167.6 cm) Weight: 96.6 kg (212 lb 15.4 oz) IBW/kg (Calculated) : 59.3 Heparin Dosing Weight: 80.9 kg  Vital Signs: Temp: 98.6 F (37 C) (12/12 0822) BP: 176/103 (12/12 0822) Pulse Rate: 105 (12/12 0822)  Labs: Recent Labs    01/15/23 0834  HGB 7.5*  HCT 28.6*  PLT 532*    CrCl cannot be calculated (Patient's most recent lab result is older than the maximum 21 days allowed.).   Medical History: Past Medical History:  Diagnosis Date   Diabetes mellitus without complication (HCC)    Hypertension     Medications:  No home anticoagulants per pharmacist review  Assessment: 42 yo female presented to the ED due to numbness and pain in right foot.  In triage patient foot noted to be cooler and unable to palpate pedal pulse.  Pharmacy consulted for initiation of heparin infusion.  Goal of Therapy:  Heparin level 0.3-0.7 units/ml Monitor platelets by anticoagulation protocol: Yes   Plan:  Give 5000 units bolus x 1 Start heparin infusion at 1300 units/hr Check anti-Xa level in 6 hours and daily while on heparin Continue to monitor H&H and platelets  Barrie Folk, PharmD 01/15/2023,9:32 AM

## 2023-01-15 NOTE — Interval H&P Note (Signed)
History and Physical Interval Note:  01/15/2023 3:30 PM  Jennifer [name]  has presented today for surgery, with the diagnosis of PAD.  The various methods of treatment have been discussed with the patient and family. After consideration of risks, benefits and other options for treatment, the patient has consented to  Procedure(s): Lower Extremity Angiography (Right) as a surgical intervention.  The patient's history has been reviewed, patient examined, no change in status, stable for surgery.  I have reviewed the patient's chart and labs.  Questions were answered to the patient's satisfaction.     [name]

## 2023-01-15 NOTE — ED Triage Notes (Signed)
Numbness and pain to R foot pain that started in calf and then began to affect foot 2 days ago. States that she noticed R foot darker than L this AM. In triage, R foot is noticably cooler and appears dusky. L pedal 2+, R pedal unable to be palpated.

## 2023-01-15 NOTE — ED Provider Notes (Signed)
[facility] Provider Note   Event Date/Time   First MD Initiated Contact with Patient 01/15/23 [phone]     (approximate) History  Foot Pain and Numbness  HPI Jennifer [name] is a 42 y.o. female with stated past medical history of type 2 diabetes, CAD, and heavy tobacco abuse who presents complaining of of right lower leg pain that began 2 days prior to arrival.  Patient states that she began having right calf pain 2 days ago that has now spread to the right ankle and foot specifically in her toes.  Patient states that they feel cold, numb, but also painful.  Patient describes a deep aching pain that is 10/10, and has been worsening since onset.  Patient states it is not difficult to walk on this foot. ROS: Patient currently denies any vision changes, tinnitus, difficulty speaking, facial droop, sore throat, chest pain, shortness of breath, abdominal pain, nausea/vomiting/diarrhea, dysuria   Physical Exam  Triage Vital Signs: ED Triage Vitals  Encounter Vitals Group     BP 01/15/23 0822 (!) 176/103     Systolic BP Percentile --      Diastolic BP Percentile --      Pulse Rate 01/15/23 0822 (!) 105     Resp 01/15/23 0822 20     Temp 01/15/23 0822 98.6 F (37 C)     Temp src --      SpO2 01/15/23 0822 100 %     Weight 01/15/23 0822 213 lb (96.6 kg)     Height 01/15/23 0838 5\' 6"  (1.676 m)     Head Circumference --      Peak Flow --      Pain Score 01/15/23 0822 10     Pain Loc --      Pain Education --      Exclude from Growth Chart --    Most recent vital signs: Vitals:   01/15/23 0822 01/15/23 1156  BP: (!) 176/103 (!) 158/99  Pulse: (!) 105 100  Resp: 20 20  Temp: 98.6 F (37 C) 98 F (36.7 C)  SpO2: 100% 100%   General: Awake, oriented x4. CV:  Good peripheral perfusion.  Resp:  Normal effort.  Abd:  No distention.  Other:  Middle-aged obese African-American female resting comfortably in mild acute distress secondary to pain.  No palpable or  dopplerable pulses in the right foot at the DP or PT region ED Results / Procedures / Treatments  Labs (all labs ordered are listed, but only abnormal results are displayed) Labs Reviewed  COMPREHENSIVE METABOLIC PANEL - Abnormal; Notable for the following components:      Result Value   Sodium 127 (*)    Chloride 97 (*)    CO2 20 (*)    Glucose, Bld 602 (*)    Creatinine, Ser 1.33 (*)    GFR, Estimated 51 (*)    All other components within normal limits  LACTIC ACID, PLASMA - Abnormal; Notable for the following components:   Lactic Acid, Venous 2.8 (*)    All other components within normal limits  CBC WITH DIFFERENTIAL/PLATELET - Abnormal; Notable for the following components:   WBC 12.1 (*)    Hemoglobin 7.5 (*)    HCT 28.6 (*)    MCV 63.7 (*)    MCH 16.7 (*)    MCHC 26.2 (*)    RDW 24.1 (*)    Platelets 532 (*)    Neutro Abs 8.8 (*)    All  other components within normal limits  RETICULOCYTES - Abnormal; Notable for the following components:   Immature Retic Fract 22.0 (*)    All other components within normal limits  CBG MONITORING, ED - Abnormal; Notable for the following components:   Glucose-Capillary 342 (*)    All other components within normal limits  APTT  PROTIME-INR  LACTIC ACID, PLASMA  HEPARIN LEVEL (UNFRACTIONATED)  HEMOGLOBIN A1C  IRON AND TIBC  FERRITIN  TYPE AND SCREEN    PROCEDURES: Critical Care performed: Yes, see critical care procedure note(s) Procedures CRITICAL CARE Performed by: Merwyn Katos  Total critical care time: 37 minutes  Critical care time was exclusive of separately billable procedures and treating other patients.  Critical care was necessary to treat or prevent imminent or life-threatening deterioration.  Critical care was time spent personally by me on the following activities: development of treatment plan with patient and/or surrogate as well as nursing, discussions with consultants, evaluation of patient's response to  treatment, examination of patient, obtaining history from patient or surrogate, ordering and performing treatments and interventions, ordering and review of laboratory studies, ordering and review of radiographic studies, pulse oximetry and re-evaluation of patient's condition.  MEDICATIONS ORDERED IN ED: Medications  ondansetron (ZOFRAN) 8 mg in sodium chloride 0.9 % 50 mL IVPB (has no administration in time range)  heparin ADULT infusion 100 units/mL (25000 units/218mL) (1,300 Units/hr Intravenous New Bag/Given 01/15/23 1049)  HYDROmorphone (DILAUDID) injection 1 mg (1 mg Intravenous Given 01/15/23 1241)  insulin aspart (novoLOG) injection 0-20 Units (has no administration in time range)  insulin aspart (novoLOG) injection 0-5 Units (has no administration in time range)  HYDROmorphone (DILAUDID) injection 1 mg (1 mg Intravenous Given 01/15/23 0930)  heparin bolus via infusion 5,000 Units (5,000 Units Intravenous Bolus from Bag 01/15/23 1055)  sodium chloride 0.9 % bolus 1,000 mL (1,000 mLs Intravenous New Bag/Given 01/15/23 1109)  insulin aspart (novoLOG) injection 10 Units (10 Units Intravenous Given 01/15/23 1045)   IMPRESSION / MDM / ASSESSMENT AND PLAN / ED COURSE  I reviewed the triage vital signs and the nursing notes.                             The patient is on the cardiac monitor to evaluate for evidence of arrhythmia and/or significant heart rate changes. Patient's presentation is most consistent with acute presentation with potential threat to life or bodily function.  This patient presents to the ED for concern of right lower extremity pain, this involves an extensive number of treatment options, and is a complaint that carries with it a high risk of complications and morbidity.  The differential diagnosis includes arterial occlusion, DVT, radiculopathy, CVA Co morbidities that complicate the patient evaluation  CAD, hypertension, hyperlipidemia, tobacco abuse Additional  history obtained:  Additional history obtained from patient's family about  External records from outside source obtained and reviewed including recent ER visit on 09/14/22 Lab Tests:  I Ordered, and personally interpreted labs.  The pertinent results include: Glucose 602, leukocytosis 12.1, lactate 2.8 Consultations Obtained:  I requested consultation with the vascular surgery team,  and discussed lab and imaging findings as well as pertinent plan - they recommend: Therapeutic angiography Problem List / ED Course / Critical interventions / Medication management  Right lower extremity arterial occlusion  I ordered medication including heparin for arterial occlusion  Reevaluation of the patient after these medicines showed that the patient stayed the same  I  have reviewed the patients home medicines and have made adjustments as needed  Dispo: Admit to medicine with surgery following    FINAL CLINICAL IMPRESSION(S) / ED DIAGNOSES   Final diagnoses:  Ischemic leg   Rx / DC Orders   ED Discharge Orders     None      Note:  This document was prepared using Dragon voice recognition software and may include unintentional dictation errors.   Merwyn Katos, MD 01/15/23 [phone]

## 2023-01-15 NOTE — Op Note (Signed)
[facility]  Percutaneous Study/Intervention Procedural Note   Date of Surgery: 01/15/2023  Surgeon(s):Lekeshia Kram    Assistants:none  Pre-operative Diagnosis: PAD with rest pain and early gangrenous changes right foot  Post-operative diagnosis:  Same  Procedure(s) Performed:             1.  Ultrasound guidance for vascular access left femoral artery             2.  Catheter placement into right SFA from left femoral approach             3.  Aortogram and selective right lower extremity angiogram             4.  Stent placement to the right popliteal artery with 6 mm diameter by 15 cm length Viabahn stent             5.  Mechanical thrombectomy to the right anterior tibial artery with the penumbra CAT 6 device  6.  StarClose closure device left femoral artery  EBL: 50 cc  Contrast: 45 cc  Fluoro Time: 5.3 minutes  Moderate Conscious Sedation Time: approximately 34 minutes using 2 mg of Versed and 50 mcg of Fentanyl              Indications:  Patient is a 42 y.o.female with pain and early gangrenous changes to toes on the right foot the patient is brought in for angiography for further evaluation and potential treatment.  Due to the limb threatening nature of the situation, angiogram was performed for attempted limb salvage. The patient is aware that if the procedure fails, amputation would be expected.  The patient also understands that even with successful revascularization, amputation may still be required due to the severity of the situation.  Risks and benefits are discussed and informed consent is obtained.   Procedure:  The patient was identified and appropriate procedural time out was performed.  The patient was then placed supine on the table and prepped and draped in the usual sterile fashion. Moderate conscious sedation was administered during a face to face encounter with the patient throughout the procedure with my supervision of the RN administering  medicines and monitoring the patient's vital signs, pulse oximetry, telemetry and mental status throughout from the start of the procedure until the patient was taken to the recovery room. Ultrasound was used to evaluate the left common femoral artery.  It was patent .  A digital ultrasound image was acquired.  A Seldinger needle was used to access the left common femoral artery under direct ultrasound guidance and a permanent image was performed.  A 0.035 J wire was advanced without resistance and a 5Fr sheath was placed.  Pigtail catheter was placed into the aorta and an AP aortogram was performed. This demonstrated normal renal arteries and normal aorta and iliac segments without significant stenosis. I then crossed the aortic bifurcation and advanced to the right femoral head.  After the initial image, the catheter was advanced into the right SFA to opacify distally due to the sluggish flow.  Selective right lower extremity angiogram was then performed. This demonstrated minimal atherosclerosis but there was a thrombotic/embolic near occlusive stenosis in the mid popliteal artery.  The distal popliteal artery normalized in both the anterior tibial and posterior tibial arteries were initially patent but both occluded in the mid to distal segments and this was clearly thrombotic in nature.  Essentially no flow was seen in the foot. It was felt that it  was in the patient's best interest to proceed with intervention after these images to avoid a second procedure and a larger amount of contrast and fluoroscopy based off of the findings from the initial angiogram. The patient was systemically heparinized and a 6 French 70 cm sheath was then placed over the Terumo Advantage wire. I then used a Kumpe catheter and the advantage wire to easily cross the thrombotic lesion in the popliteal artery and then exchanged for a V18 wire which was parked in the anterior tibial artery.  I primarily stented the right popliteal artery  with a 6 mm diameter by 15 cm length Viabahn stent postdilated this with a 5 mm diameter Lutonix drug-coated balloon.  There is only about a 10% residual stenosis in the popliteal artery but the tibial vessels remain sluggish in their flow.  The Kumpe catheter was taken down into the anterior tibial artery where selective imaging was performed showing thrombotic occlusion in the mid to distal anterior tibial artery.  I was able to get a wire down and tried to perform thrombectomy with the penumbra CAT 6 device all the way into the foot.  A significant amount of fairly chronic appearing thrombus was removed with thrombectomy but the flow remained extremely sluggish.  Multiple passes were made with the thrombectomy device.  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  The vessel disease in this location would be too small for continuous thrombolytic drip.  I felt our best option for any chance of limb salvage would be an Aggrastat drip overnight and this will be initiated.  The diagnostic catheter and wires were removed. I elected to terminate the procedure. The sheath was removed and StarClose closure device was deployed in the left femoral artery with excellent hemostatic result. The patient was taken to the recovery room in stable condition having tolerated the procedure well.  Findings:               Aortogram:  This demonstrated normal renal arteries and normal aorta and iliac segments without significant stenosis.             Right Lower Extremity:  This demonstrated minimal atherosclerosis but there was a thrombotic/embolic near occlusive stenosis in the mid popliteal artery.  The distal popliteal artery normalized in both the anterior tibial and posterior tibial arteries were initially patent but both occluded in the mid to distal segments and this  was clearly thrombotic in nature.  Essentially no flow was seen in the foot   Disposition: Patient was taken to the recovery room in stable condition having tolerated the procedure well.  Complications: None  [name] 01/15/2023 4:51 PM   This note was created with Dragon Medical transcription system. Any errors in dictation are purely unintentional.

## 2023-01-15 NOTE — Progress Notes (Signed)
Patient brought to specials/15 today for lower extremity angiogram per Dr Wyn Quaker. States took her 70/30 insulin prior to coming to ER today, noting bld glucose, assisted in prepping patient for procedure. Report given to [facility] RN who will be taking care of this patient with questions answered.

## 2023-01-15 NOTE — Consult Note (Signed)
Hospital Consult    Reason for Consult:  Ischemic right foot Requesting Physician:  Dr Donna Bernard MD MRN #:  [id number]  History of Present Illness: This is a 42 y.o. female who presents to [facility] emergency department with complaints of worsening right foot pain. She has a past medical history of type 2 diabetes, CAD, and heavy tobacco abuse who presents complaining of of right lower leg pain that began 2 days prior to arrival.  Patient states that she began having right calf pain 2 days ago that has now spread to the right ankle and foot specifically in her toes.  Patient states that they feel cold, numb, but also painful.  Patient describes a deep aching pain that is 10/10, and has been worsening since onset.  Patient states it is difficult to walk on this foot.   Upon exam this morning patient is uncomfortable in a stretcher due to pain to her right lower extremity.  Patient's left lower extremity is warm to touch with palpable pulses.  Patient's right lower extremity has +1 edema from her knee to her toes.  I was able to get a Doppler popliteal pulse.  I was unable to get a Doppler DP/PT pulse.  Patient's foot is cold to touch with no capillary refill noted.  Patient noted to have a blood sugar of 606 this morning despite endorsing taking her blood glucose medication. Patient endorses eating breakfast at 8 am this morning.  Vascular surgery consulted to evaluate  Past Medical History:  Diagnosis Date   Diabetes mellitus without complication (HCC)    Hypertension     Past Surgical History:  Procedure Laterality Date   CESAREAN SECTION     x2   none      Allergies  Allergen Reactions   Demerol [Meperidine] Hives, Itching and Nausea And Vomiting   Meloxicam Nausea And Vomiting   Morphine Other (See Comments)    Other Reaction: tolerates hydromorphone   Oxycodone Hives, Itching and Nausea And Vomiting   Toradol [Ketorolac Tromethamine] Hives, Itching and Nausea And Vomiting     Prior to Admission medications   Medication Sig Start Date End Date Taking? Authorizing Provider  atorvastatin (LIPITOR) 40 MG tablet Take 0.5 tablets (20 mg total) by mouth daily. 05/25/22   Enedina Finner, MD  cyclobenzaprine (FLEXERIL) 10 MG tablet Take 10 mg by mouth daily as needed for muscle spasms. 09/13/21   [provider]  diltiazem (CARDIZEM CD) 180 MG 24 hr capsule Take 1 capsule (180 mg total) by mouth daily. 05/25/22   Enedina Finner, MD  ferrous sulfate 325 (65 FE) MG EC tablet Take 1 tablet (325 mg total) by mouth 2 (two) times daily. 04/17/21 05/23/22  Merwyn Katos, MD  folic acid (FOLVITE) 1 MG tablet Take 1 tablet (1 mg total) by mouth daily. 10/16/21   Darlin Priestly, MD  gabapentin (NEURONTIN) 300 MG capsule Take 600 mg by mouth 3 (three) times daily. 04/15/21   [provider]  hydrALAZINE (APRESOLINE) 25 MG tablet Take 1 tablet (25 mg total) by mouth every 8 (eight) hours. 10/16/21 05/23/22  Darlin Priestly, MD  hydrochlorothiazide (HYDRODIURIL) 25 MG tablet Take 25 mg by mouth daily.    [provider]  HYDROcodone-acetaminophen (NORCO/VICODIN) 5-325 MG tablet Take 1 tablet by mouth every 8 (eight) hours as needed for severe pain. 05/24/22   Enedina Finner, MD  insulin aspart protamine - aspart (NOVOLOG 70/30 FLEXPEN) (70-30) 100 UNIT/ML FlexPen Inject 45 Units into the skin 2 (  two) times daily with a meal. Patient taking differently: Inject 45 Units into the skin 3 (three) times daily. 10/16/21 05/23/22  Darlin Priestly, MD  lisinopril (ZESTRIL) 40 MG tablet Take 40 mg by mouth daily. 04/15/21   [provider]  metFORMIN (GLUCOPHAGE) 1000 MG tablet Take 1 tablet (1,000 mg total) by mouth 2 (two) times daily with a meal. 10/16/21 05/23/22  Darlin Priestly, MD  nicotine (NICODERM CQ - DOSED IN MG/24 HOURS) 21 mg/24hr patch Place 1 patch (21 mg total) onto the skin daily. 05/25/22   Enedina Finner, MD    Social History   Socioeconomic History   Marital status: Married    Spouse  name: Not on file   Number of children: Not on file   Years of education: Not on file   Highest education level: Not on file  Occupational History   Not on file  Tobacco Use   Smoking status: Every Day    Types: Cigarettes   Smokeless tobacco: Never  Substance and Sexual Activity   Alcohol use: No   Drug use: No   Sexual activity: Not on file  Other Topics Concern   Not on file  Social History Narrative   Not on file   Social Drivers of Health   Financial Resource Strain: Not on file  Food Insecurity: No Food Insecurity (05/24/2022)   Hunger Vital Sign    Worried About Running Out of Food in the Last Year: Never true    Ran Out of Food in the Last Year: Never true  Transportation Needs: No Transportation Needs (05/24/2022)   PRAPARE - Administrator, Civil Service (Medical): No    Lack of Transportation (Non-Medical): No  Physical Activity: Not on file  Stress: Not on file  Social Connections: Not on file  Intimate Partner Violence: Not At Risk (05/24/2022)   Humiliation, Afraid, Rape, and Kick questionnaire    Fear of Current or Ex-Partner: No    Emotionally Abused: No    Physically Abused: No    Sexually Abused: No     Family History  Problem Relation Age of Onset   Diabetes Other    Hypertension Other     ROS: Otherwise negative unless mentioned in HPI  Physical Examination  Vitals:   01/15/23 0822  BP: (!) 176/103  Pulse: (!) 105  Resp: 20  Temp: 98.6 F (37 C)  SpO2: 100%   Body mass index is 34.37 kg/m.  General:  WDWN in NAD Gait: Not observed HENT: WNL, normocephalic Pulmonary: normal non-labored breathing, without Rales, rhonchi,  wheezing Cardiac: irregular, without  Murmurs, rubs or gallops; without carotid bruits Abdomen: Positive bowel sounds throughout, soft, NT/ND, no masses Skin: without rashes Vascular Exam/Pulses: Left lower extremity with palpable pulses. Right lower extremity with doppler Popliteal pulse but without  doppler DP/PT pulses. Foot is cold to touch without capillary refill.  Extremities: with ischemic changes, without Gangrene , without cellulitis; without open wounds;  Musculoskeletal: no muscle wasting or atrophy  Neurologic: A&O X 3;  No focal weakness or paresthesias are detected; speech is fluent/normal Psychiatric:  The pt has Normal affect. Lymph:  Unremarkable  CBC    Component Value Date/Time   WBC 12.1 (H) 01/15/2023 0834   RBC 4.49 01/15/2023 0834   HGB 7.5 (L) 01/15/2023 0834   HGB 12.0 06/01/2013 1805   HCT 28.6 (L) 01/15/2023 0834   HCT 39.4 06/01/2013 1805   PLT 532 (H) 01/15/2023 0834   PLT 325  06/01/2013 1805   MCV 63.7 (L) 01/15/2023 0834   MCV 71 (L) 06/01/2013 1805   MCH 16.7 (L) 01/15/2023 0834   MCHC 26.2 (L) 01/15/2023 0834   RDW 24.1 (H) 01/15/2023 0834   RDW 18.1 (H) 06/01/2013 1805   LYMPHSABS 2.1 01/15/2023 0834   MONOABS 0.8 01/15/2023 0834   EOSABS 0.3 01/15/2023 0834   BASOSABS 0.0 01/15/2023 0834    BMET    Component Value Date/Time   NA 127 (L) 01/15/2023 0834   NA 135 (L) 06/01/2013 1805   K 4.3 01/15/2023 0834   K 3.6 06/01/2013 1805   CL 97 (L) 01/15/2023 0834   CL 103 06/01/2013 1805   CO2 20 (L) 01/15/2023 0834   CO2 25 06/01/2013 1805   GLUCOSE 602 (HH) 01/15/2023 0834   GLUCOSE 179 (H) 06/01/2013 1805   BUN 14 01/15/2023 0834   BUN 11 06/01/2013 1805   CREATININE 1.33 (H) 01/15/2023 0834   CREATININE 0.79 06/01/2013 1805   CALCIUM 9.1 01/15/2023 0834   CALCIUM 9.1 06/01/2013 1805   GFRNONAA 51 (L) 01/15/2023 0834   GFRNONAA >60 06/01/2013 1805   GFRAA >60 10/08/2016 1820   GFRAA >60 06/01/2013 1805    COAGS: Lab Results  Component Value Date   INR 1.0 01/15/2023   INR 1.0 05/26/2022   INR 1.3 (H) 05/23/2022     Non-Invasive Vascular Imaging:   None Ordered  Statin:  Yes.   Beta Blocker:  No. Aspirin:  No. ACEI:  Yes.   ARB:  No. CCB use:  Yes Other antiplatelets/anticoagulants:  No.    ASSESSMENT/PLAN:  This is a 42 y.o. female who presents to [facility] emergency department complaining of right lower extremity pain that began 2 days prior.  She endorses right calf pain 2 days ago that is now spreading to her right ankle and foot specifically in her toes.  On exam patient does not have dopplerable DP/PT pulses but does have a Doppler popliteal pulse.  Vascular surgery plans on taking the patient to the vascular lab later today 01/15/2023 for right lower extremity angiogram with possible intervention.  I had a long detailed discussion today with the patient and husband at the bedside concerning the procedure, benefits, risk, and complications.  Both verbalized her understanding and wished to proceed as soon as possible.  I answered all their questions this morning.  Patient endorses she ate breakfast at 7:30 AM this morning but has not had anything to eat or drink since then.  She arrived in the ED at 8:15 AM this morning.  Patient started on a heparin infusion and given some pain medication.   -I discussed the plan in detail with Dr. [name] MD and he agrees with the plan.   [facility] 01/15/2023 10:38 AM

## 2023-01-15 NOTE — H&P (View-Only) (Signed)
Hospital Consult    Reason for Consult:  Ischemic right foot Requesting Physician:  Dr Donna Bernard MD MRN #:  [id number]  History of Present Illness: This is a 42 y.o. female who presents to [facility] emergency department with complaints of worsening right foot pain. She has a past medical history of type 2 diabetes, CAD, and heavy tobacco abuse who presents complaining of of right lower leg pain that began 2 days prior to arrival.  Patient states that she began having right calf pain 2 days ago that has now spread to the right ankle and foot specifically in her toes.  Patient states that they feel cold, numb, but also painful.  Patient describes a deep aching pain that is 10/10, and has been worsening since onset.  Patient states it is difficult to walk on this foot.   Upon exam this morning patient is uncomfortable in a stretcher due to pain to her right lower extremity.  Patient's left lower extremity is warm to touch with palpable pulses.  Patient's right lower extremity has +1 edema from her knee to her toes.  I was able to get a Doppler popliteal pulse.  I was unable to get a Doppler DP/PT pulse.  Patient's foot is cold to touch with no capillary refill noted.  Patient noted to have a blood sugar of 606 this morning despite endorsing taking her blood glucose medication. Patient endorses eating breakfast at 8 am this morning.  Vascular surgery consulted to evaluate  Past Medical History:  Diagnosis Date   Diabetes mellitus without complication (HCC)    Hypertension     Past Surgical History:  Procedure Laterality Date   CESAREAN SECTION     x2   none      Allergies  Allergen Reactions   Demerol [Meperidine] Hives, Itching and Nausea And Vomiting   Meloxicam Nausea And Vomiting   Morphine Other (See Comments)    Other Reaction: tolerates hydromorphone   Oxycodone Hives, Itching and Nausea And Vomiting   Toradol [Ketorolac Tromethamine] Hives, Itching and Nausea And Vomiting     Prior to Admission medications   Medication Sig Start Date End Date Taking? Authorizing Provider  atorvastatin (LIPITOR) 40 MG tablet Take 0.5 tablets (20 mg total) by mouth daily. 05/25/22   Enedina Finner, MD  cyclobenzaprine (FLEXERIL) 10 MG tablet Take 10 mg by mouth daily as needed for muscle spasms. 09/13/21   [provider]  diltiazem (CARDIZEM CD) 180 MG 24 hr capsule Take 1 capsule (180 mg total) by mouth daily. 05/25/22   Enedina Finner, MD  ferrous sulfate 325 (65 FE) MG EC tablet Take 1 tablet (325 mg total) by mouth 2 (two) times daily. 04/17/21 05/23/22  Merwyn Katos, MD  folic acid (FOLVITE) 1 MG tablet Take 1 tablet (1 mg total) by mouth daily. 10/16/21   Darlin Priestly, MD  gabapentin (NEURONTIN) 300 MG capsule Take 600 mg by mouth 3 (three) times daily. 04/15/21   [provider]  hydrALAZINE (APRESOLINE) 25 MG tablet Take 1 tablet (25 mg total) by mouth every 8 (eight) hours. 10/16/21 05/23/22  Darlin Priestly, MD  hydrochlorothiazide (HYDRODIURIL) 25 MG tablet Take 25 mg by mouth daily.    [provider]  HYDROcodone-acetaminophen (NORCO/VICODIN) 5-325 MG tablet Take 1 tablet by mouth every 8 (eight) hours as needed for severe pain. 05/24/22   Enedina Finner, MD  insulin aspart protamine - aspart (NOVOLOG 70/30 FLEXPEN) (70-30) 100 UNIT/ML FlexPen Inject 45 Units into the skin 2 (  two) times daily with a meal. Patient taking differently: Inject 45 Units into the skin 3 (three) times daily. 10/16/21 05/23/22  Darlin Priestly, MD  lisinopril (ZESTRIL) 40 MG tablet Take 40 mg by mouth daily. 04/15/21   [provider]  metFORMIN (GLUCOPHAGE) 1000 MG tablet Take 1 tablet (1,000 mg total) by mouth 2 (two) times daily with a meal. 10/16/21 05/23/22  Darlin Priestly, MD  nicotine (NICODERM CQ - DOSED IN MG/24 HOURS) 21 mg/24hr patch Place 1 patch (21 mg total) onto the skin daily. 05/25/22   Enedina Finner, MD    Social History   Socioeconomic History   Marital status: Married    Spouse  name: Not on file   Number of children: Not on file   Years of education: Not on file   Highest education level: Not on file  Occupational History   Not on file  Tobacco Use   Smoking status: Every Day    Types: Cigarettes   Smokeless tobacco: Never  Substance and Sexual Activity   Alcohol use: No   Drug use: No   Sexual activity: Not on file  Other Topics Concern   Not on file  Social History Narrative   Not on file   Social Drivers of Health   Financial Resource Strain: Not on file  Food Insecurity: No Food Insecurity (05/24/2022)   Hunger Vital Sign    Worried About Running Out of Food in the Last Year: Never true    Ran Out of Food in the Last Year: Never true  Transportation Needs: No Transportation Needs (05/24/2022)   PRAPARE - Administrator, Civil Service (Medical): No    Lack of Transportation (Non-Medical): No  Physical Activity: Not on file  Stress: Not on file  Social Connections: Not on file  Intimate Partner Violence: Not At Risk (05/24/2022)   Humiliation, Afraid, Rape, and Kick questionnaire    Fear of Current or Ex-Partner: No    Emotionally Abused: No    Physically Abused: No    Sexually Abused: No     Family History  Problem Relation Age of Onset   Diabetes Other    Hypertension Other     ROS: Otherwise negative unless mentioned in HPI  Physical Examination  Vitals:   01/15/23 0822  BP: (!) 176/103  Pulse: (!) 105  Resp: 20  Temp: 98.6 F (37 C)  SpO2: 100%   Body mass index is 34.37 kg/m.  General:  WDWN in NAD Gait: Not observed HENT: WNL, normocephalic Pulmonary: normal non-labored breathing, without Rales, rhonchi,  wheezing Cardiac: irregular, without  Murmurs, rubs or gallops; without carotid bruits Abdomen: Positive bowel sounds throughout, soft, NT/ND, no masses Skin: without rashes Vascular Exam/Pulses: Left lower extremity with palpable pulses. Right lower extremity with doppler Popliteal pulse but without  doppler DP/PT pulses. Foot is cold to touch without capillary refill.  Extremities: with ischemic changes, without Gangrene , without cellulitis; without open wounds;  Musculoskeletal: no muscle wasting or atrophy  Neurologic: A&O X 3;  No focal weakness or paresthesias are detected; speech is fluent/normal Psychiatric:  The pt has Normal affect. Lymph:  Unremarkable  CBC    Component Value Date/Time   WBC 12.1 (H) 01/15/2023 0834   RBC 4.49 01/15/2023 0834   HGB 7.5 (L) 01/15/2023 0834   HGB 12.0 06/01/2013 1805   HCT 28.6 (L) 01/15/2023 0834   HCT 39.4 06/01/2013 1805   PLT 532 (H) 01/15/2023 0834   PLT 325  06/01/2013 1805   MCV 63.7 (L) 01/15/2023 0834   MCV 71 (L) 06/01/2013 1805   MCH 16.7 (L) 01/15/2023 0834   MCHC 26.2 (L) 01/15/2023 0834   RDW 24.1 (H) 01/15/2023 0834   RDW 18.1 (H) 06/01/2013 1805   LYMPHSABS 2.1 01/15/2023 0834   MONOABS 0.8 01/15/2023 0834   EOSABS 0.3 01/15/2023 0834   BASOSABS 0.0 01/15/2023 0834    BMET    Component Value Date/Time   NA 127 (L) 01/15/2023 0834   NA 135 (L) 06/01/2013 1805   K 4.3 01/15/2023 0834   K 3.6 06/01/2013 1805   CL 97 (L) 01/15/2023 0834   CL 103 06/01/2013 1805   CO2 20 (L) 01/15/2023 0834   CO2 25 06/01/2013 1805   GLUCOSE 602 (HH) 01/15/2023 0834   GLUCOSE 179 (H) 06/01/2013 1805   BUN 14 01/15/2023 0834   BUN 11 06/01/2013 1805   CREATININE 1.33 (H) 01/15/2023 0834   CREATININE 0.79 06/01/2013 1805   CALCIUM 9.1 01/15/2023 0834   CALCIUM 9.1 06/01/2013 1805   GFRNONAA 51 (L) 01/15/2023 0834   GFRNONAA >60 06/01/2013 1805   GFRAA >60 10/08/2016 1820   GFRAA >60 06/01/2013 1805    COAGS: Lab Results  Component Value Date   INR 1.0 01/15/2023   INR 1.0 05/26/2022   INR 1.3 (H) 05/23/2022     Non-Invasive Vascular Imaging:   None Ordered  Statin:  Yes.   Beta Blocker:  No. Aspirin:  No. ACEI:  Yes.   ARB:  No. CCB use:  Yes Other antiplatelets/anticoagulants:  No.    ASSESSMENT/PLAN:  This is a 42 y.o. female who presents to [facility] emergency department complaining of right lower extremity pain that began 2 days prior.  She endorses right calf pain 2 days ago that is now spreading to her right ankle and foot specifically in her toes.  On exam patient does not have dopplerable DP/PT pulses but does have a Doppler popliteal pulse.  Vascular surgery plans on taking the patient to the vascular lab later today 01/15/2023 for right lower extremity angiogram with possible intervention.  I had a long detailed discussion today with the patient and husband at the bedside concerning the procedure, benefits, risk, and complications.  Both verbalized her understanding and wished to proceed as soon as possible.  I answered all their questions this morning.  Patient endorses she ate breakfast at 7:30 AM this morning but has not had anything to eat or drink since then.  She arrived in the ED at 8:15 AM this morning.  Patient started on a heparin infusion and given some pain medication.   -I discussed the plan in detail with Dr. [name] MD and he agrees with the plan.   Marcie [facility] 01/15/2023 10:38 AM

## 2023-01-15 NOTE — H&P (Signed)
History and Physical    Jennifer [name] GNF:[id number] DOB: 01-26-1981 DOA: 01/15/2023  [facility]P: [facility] (Confirm with patient/family/NH records and if not entered, this has to be entered at [facility] point of entry) Patient coming from: Home  I have personally briefly reviewed patient's old medical records in [facility]  Chief Complaint: Right foot pain  HPI: Jennifer [name] is a 42 y.o. female with medical history significant of PAF on aspirin, HTN, IDDM, chronic diabetic neuropathy, presented with worsening of right foot pain.  Patient does have a chronic bilateral feet pain right more than left which she attributed to diabetic neuropathy and her gabapentin was increased recently to address that issue.  2 days ago patient started to have right calf pain, claudication worsening with ambulation and last night patient woke up with severe pain of right foot.  She denied any chest pains.  She was diagnosed with A-fib August this year and has been taking Cardizem and aspirin.  ED Course: Afebrile, nonhypoxic physical exam and Doppler study implying acute right lower extremity ischemia.  Vascular surgery consulted and Dr. Wyn Quaker plans to take patient to the OR for angiogram this afternoon.  Glucose> 600  Review of Systems: As per HPI otherwise 14 point review of systems negative.    Past Medical History:  Diagnosis Date   Diabetes mellitus without complication (HCC)    Hypertension     Past Surgical History:  Procedure Laterality Date   CESAREAN SECTION     x2   none       reports that she has been smoking. She has never used smokeless tobacco. She reports that she does not drink alcohol and does not use drugs.  Allergies  Allergen Reactions   Demerol [Meperidine] Hives, Itching and Nausea And Vomiting   Meloxicam Nausea And Vomiting   Morphine Other (See Comments)    Other Reaction: tolerates hydromorphone   Oxycodone Hives, Itching and Nausea And  Vomiting   Toradol [Ketorolac Tromethamine] Hives, Itching and Nausea And Vomiting    Family History  Problem Relation Age of Onset   Diabetes Other    Hypertension Other     Prior to Admission medications   Medication Sig Start Date End Date Taking? Authorizing Provider  atorvastatin (LIPITOR) 40 MG tablet Take 0.5 tablets (20 mg total) by mouth daily. 05/25/22   Enedina Finner, MD  cyclobenzaprine (FLEXERIL) 10 MG tablet Take 10 mg by mouth daily as needed for muscle spasms. 09/13/21   [provider]  diltiazem (CARDIZEM CD) 180 MG 24 hr capsule Take 1 capsule (180 mg total) by mouth daily. 05/25/22   Enedina Finner, MD  ferrous sulfate 325 (65 FE) MG EC tablet Take 1 tablet (325 mg total) by mouth 2 (two) times daily. 04/17/21 05/23/22  Merwyn Katos, MD  folic acid (FOLVITE) 1 MG tablet Take 1 tablet (1 mg total) by mouth daily. 10/16/21   Darlin Priestly, MD  gabapentin (NEURONTIN) 300 MG capsule Take 600 mg by mouth 3 (three) times daily. 04/15/21   [provider]  hydrALAZINE (APRESOLINE) 25 MG tablet Take 1 tablet (25 mg total) by mouth every 8 (eight) hours. 10/16/21 05/23/22  Darlin Priestly, MD  hydrochlorothiazide (HYDRODIURIL) 25 MG tablet Take 25 mg by mouth daily.    [provider]  HYDROcodone-acetaminophen (NORCO/VICODIN) 5-325 MG tablet Take 1 tablet by mouth every 8 (eight) hours as needed for severe pain. 05/24/22   Enedina Finner, MD  insulin aspart  protamine - aspart (NOVOLOG 70/30 FLEXPEN) (70-30) 100 UNIT/ML FlexPen Inject 45 Units into the skin 2 (two) times daily with a meal. Patient taking differently: Inject 45 Units into the skin 3 (three) times daily. 10/16/21 05/23/22  Darlin Priestly, MD  lisinopril (ZESTRIL) 40 MG tablet Take 40 mg by mouth daily. 04/15/21   [provider]  metFORMIN (GLUCOPHAGE) 1000 MG tablet Take 1 tablet (1,000 mg total) by mouth 2 (two) times daily with a meal. 10/16/21 05/23/22  Darlin Priestly, MD  nicotine (NICODERM CQ - DOSED IN MG/24  HOURS) 21 mg/24hr patch Place 1 patch (21 mg total) onto the skin daily. 05/25/22   Enedina Finner, MD    Physical Exam: Vitals:   01/15/23 0822 01/15/23 0838 01/15/23 1156  BP: (!) 176/103  (!) 158/99  Pulse: (!) 105  100  Resp: 20  20  Temp: 98.6 F (37 C)  98 F (36.7 C)  SpO2: 100%  100%  Weight: 96.6 kg 96.6 kg   Height:  5\' 6"  (1.676 m)     Constitutional: NAD, calm, comfortable Vitals:   01/15/23 0822 01/15/23 0838 01/15/23 1156  BP: (!) 176/103  (!) 158/99  Pulse: (!) 105  100  Resp: 20  20  Temp: 98.6 F (37 C)  98 F (36.7 C)  SpO2: 100%  100%  Weight: 96.6 kg 96.6 kg   Height:  5\' 6"  (1.676 m)    Eyes: PERRL, lids and conjunctivae normal ENMT: Mucous membranes are moist. Posterior pharynx clear of any exudate or lesions.Normal dentition.  Neck: normal, supple, no masses, no thyromegaly Respiratory: clear to auscultation bilaterally, no wheezing, no crackles. Normal respiratory effort. No accessory muscle use.  Cardiovascular: Regular rate and rhythm, no murmurs / rubs / gallops. No extremity edema.  Diminished right side pedal pulses. No carotid bruits.  Abdomen: no tenderness, no masses palpated. No hepatosplenomegaly. Bowel sounds positive.  Musculoskeletal: no clubbing / cyanosis. No joint deformity upper and lower extremities. Good ROM, no contractures. Normal muscle tone.  Skin: no rashes, lesions, ulcers. No induration Neurologic: CN 2-12 grossly intact. Sensation intact, DTR normal. Strength 5/5 in all 4.  Psychiatric: Normal judgment and insight. Alert and oriented x 3. Normal mood.    Labs on Admission: I have personally reviewed following labs and imaging studies  CBC: Recent Labs  Lab 01/15/23 0834  WBC 12.1*  NEUTROABS 8.8*  HGB 7.5*  HCT 28.6*  MCV 63.7*  PLT 532*   Basic Metabolic Panel: Recent Labs  Lab 01/15/23 0834  NA 127*  K 4.3  CL 97*  CO2 20*  GLUCOSE 602*  BUN 14  CREATININE 1.33*  CALCIUM 9.1   GFR: Estimated  Creatinine Clearance: 63.2 mL/min (A) (by C-G formula based on SCr of 1.33 mg/dL (H)). Liver Function Tests: Recent Labs  Lab 01/15/23 0834  AST 16  ALT 12  ALKPHOS 70  BILITOT 0.3  PROT 8.0  ALBUMIN 3.5   No results for input(s): "LIPASE", "AMYLASE" in the last 168 hours. No results for input(s): "AMMONIA" in the last 168 hours. Coagulation Profile: Recent Labs  Lab 01/15/23 0925  INR 1.0   Cardiac Enzymes: No results for input(s): "CKTOTAL", "CKMB", "CKMBINDEX", "TROPONINI" in the last 168 hours. BNP (last 3 results) No results for input(s): "PROBNP" in the last 8760 hours. HbA1C: No results for input(s): "HGBA1C" in the last 72 hours. CBG: Recent Labs  Lab 01/15/23 1212  GLUCAP 342*   Lipid Profile: No results for input(s): "CHOL", "HDL", "  LDLCALC", "TRIG", "CHOLHDL", "LDLDIRECT" in the last 72 hours. Thyroid Function Tests: No results for input(s): "TSH", "T4TOTAL", "FREET4", "T3FREE", "THYROIDAB" in the last 72 hours. Anemia Panel: No results for input(s): "VITAMINB12", "FOLATE", "FERRITIN", "TIBC", "IRON", "RETICCTPCT" in the last 72 hours. Urine analysis:    Component Value Date/Time   COLORURINE COLORLESS (A) 09/14/2022 1754   APPEARANCEUR CLEAR (A) 09/14/2022 1754   APPEARANCEUR Hazy 05/28/2013 2350   LABSPEC 1.021 09/14/2022 1754   LABSPEC 1.015 05/28/2013 2350   PHURINE 5.0 09/14/2022 1754   GLUCOSEU >=500 (A) 09/14/2022 1754   GLUCOSEU >=500 05/28/2013 2350   HGBUR NEGATIVE 09/14/2022 1754   BILIRUBINUR NEGATIVE 09/14/2022 1754   BILIRUBINUR Negative 05/28/2013 2350   KETONESUR NEGATIVE 09/14/2022 1754   PROTEINUR NEGATIVE 09/14/2022 1754   NITRITE NEGATIVE 09/14/2022 1754   LEUKOCYTESUR NEGATIVE 09/14/2022 1754   LEUKOCYTESUR Negative 05/28/2013 2350    Radiological Exams on Admission: No results found.  EKG: Ordered  Assessment/Plan Principal Problem:   Limb ischemia Active Problems:   Atrial fibrillation with RVR (HCC)   Acute lower  limb ischemia  (please populate well all problems here in Problem List. (For example, if patient is on BP meds at home and you resume or decide to hold them, it is a problem that needs to be her. Same for CAD, COPD, HLD and so on)  Acute right lower limb ischemia -Clinically suspect embolic event given the history of PAF.  Probably on top of PVD.  Expect patient discharged on Eliquis -For now patient is on heparin drip and Dr. Wyn Quaker plans to take patient to the OR this afternoon for angiogram -Risk factor modification, smoking cessation education performed at bedside -Continue statin  PAF -Continue Cardizem -Consider Eliquis over aspirin on discharge  Chronic iron deficiency anemia -Diagnosed last year, not on any iron supplement.  H&H appears to be stable patient denied any GI symptoms, no black tarry stool. -Will check iron study again, may need to initiate iron supplement  HTN -Stable, continue home BP meds including HCTZ and Cardizem, lisinopril  IDDM with hyperglycemia -Continue home dosage of 70/30 -SSI  Diabetic neuropathy -Continue gabapentin  DVT prophylaxis: Heparin drip Code Status: Full code Family Communication: None at bedside Disposition Plan: Patient is sick with acute limb ischemia requiring emergency vascular surgery intervention, expect more than 2 midnight hospital stay Consults called: Vascular surgery Admission status: Telemetry admission   Emeline General MD [facility] Pager [phone]  01/15/2023, 1:24 PM

## 2023-01-16 ENCOUNTER — Inpatient Hospital Stay (HOSPITAL_COMMUNITY)
Admit: 2023-01-16 | Discharge: 2023-01-16 | Disposition: A | Discharge status: Home / Self Care | Payer: Medicaid Other | Attending: Vascular Surgery | Admitting: Vascular Surgery

## 2023-01-16 ENCOUNTER — Encounter: Payer: Self-pay | Admitting: Vascular Surgery

## 2023-01-16 DIAGNOSIS — I4891 Unspecified atrial fibrillation: Secondary | ICD-10-CM

## 2023-01-16 DIAGNOSIS — I998 Other disorder of circulatory system: Secondary | ICD-10-CM | POA: Diagnosis not present

## 2023-01-16 LAB — CBC
HCT: 22.6 % — ABNORMAL LOW (ref 36.0–46.0)
Hemoglobin: 5.9 g/dL — ABNORMAL LOW (ref 12.0–15.0)
MCH: 16.9 pg — ABNORMAL LOW (ref 26.0–34.0)
MCHC: 26.1 g/dL — ABNORMAL LOW (ref 30.0–36.0)
MCV: 64.6 fL — ABNORMAL LOW (ref 80.0–100.0)
Platelets: 465 10*3/uL — ABNORMAL HIGH (ref 150–400)
RBC: 3.5 MIL/uL — ABNORMAL LOW (ref 3.87–5.11)
RDW: 23.8 % — ABNORMAL HIGH (ref 11.5–15.5)
WBC: 13.2 10*3/uL — ABNORMAL HIGH (ref 4.0–10.5)
nRBC: 0 % (ref 0.0–0.2)

## 2023-01-16 LAB — BASIC METABOLIC PANEL
Anion gap: 8 (ref 5–15)
BUN: 10 mg/dL (ref 6–20)
CO2: 19 mmol/L — ABNORMAL LOW (ref 22–32)
Calcium: 8.1 mg/dL — ABNORMAL LOW (ref 8.9–10.3)
Chloride: 103 mmol/L (ref 98–111)
Creatinine, Ser: 1.02 mg/dL — ABNORMAL HIGH (ref 0.44–1.00)
GFR, Estimated: >60 mL/min (ref >60)
Glucose, Bld: 379 mg/dL — ABNORMAL HIGH (ref 70–99)
Potassium: 4.5 mmol/L (ref 3.5–5.1)
Sodium: 130 mmol/L — ABNORMAL LOW (ref 135–145)

## 2023-01-16 LAB — HEPARIN LEVEL (UNFRACTIONATED)
Heparin Unfractionated: <0.1 [IU]/mL — ABNORMAL LOW (ref 0.30–0.70)
Heparin Unfractionated: <0.1 [IU]/mL — ABNORMAL LOW (ref 0.30–0.70)

## 2023-01-16 LAB — HEMOGLOBIN AND HEMATOCRIT, BLOOD
HCT: 25.1 % — ABNORMAL LOW (ref 36.0–46.0)
HCT: 27.1 % — ABNORMAL LOW (ref 36.0–46.0)
Hemoglobin: 6.9 g/dL — ABNORMAL LOW (ref 12.0–15.0)
Hemoglobin: 7.8 g/dL — ABNORMAL LOW (ref 12.0–15.0)

## 2023-01-16 LAB — PREPARE RBC (CROSSMATCH)

## 2023-01-16 LAB — GLUCOSE, CAPILLARY
Glucose-Capillary: 397 mg/dL — ABNORMAL HIGH (ref 70–99)
Glucose-Capillary: 357 mg/dL — ABNORMAL HIGH (ref 70–99)
Glucose-Capillary: 303 mg/dL — ABNORMAL HIGH (ref 70–99)
Glucose-Capillary: 307 mg/dL — ABNORMAL HIGH (ref 70–99)

## 2023-01-16 LAB — ECHOCARDIOGRAM COMPLETE
AR max vel: 2.57 cm2
AV Area VTI: 2.78 cm2
AV Area mean vel: 2.44 cm2
AV Mean grad: 8 mm[Hg]
AV Peak grad: 16.6 mm[Hg]
Ao pk vel: 2.04 m/s
Area-P 1/2: 4.93 cm2
Height: 66 in
MV VTI: 3.82 cm2
S' Lateral: 2.8 cm
Weight: 3408 [oz_av]

## 2023-01-16 MED ORDER — METHOCARBAMOL 500 MG PO TABS
1000.0000 mg | ORAL_TABLET | Freq: Three times a day (TID) | ORAL | Status: DC
  Administered 2023-01-16 – 2023-01-23 (×19): 1000 mg via ORAL
  Filled 2023-01-16 (×22): qty 2

## 2023-01-16 MED ORDER — HEPARIN BOLUS VIA INFUSION
2500.0000 [IU] | Freq: Once | INTRAVENOUS | Status: AC
  Administered 2023-01-16: 2500 [IU] via INTRAVENOUS
  Filled 2023-01-16: qty 2500

## 2023-01-16 MED ORDER — HEPARIN (PORCINE) 25000 UT/250ML-% IV SOLN
1500.0000 [IU]/h | INTRAVENOUS | Status: DC
  Administered 2023-01-16: 1000 [IU]/h via INTRAVENOUS
  Filled 2023-01-16: qty 250

## 2023-01-16 MED ORDER — SODIUM CHLORIDE 0.9% IV SOLUTION: Freq: Once | INTRAVENOUS | Status: DC

## 2023-01-16 MED ORDER — HYDROMORPHONE HCL 1 MG/ML IJ SOLN
1.0000 mg | Freq: Once | INTRAMUSCULAR | Status: AC
  Administered 2023-01-16: 1 mg via INTRAVENOUS

## 2023-01-16 MED ORDER — HYDROCODONE-ACETAMINOPHEN 10-325 MG PO TABS
1.0000 | ORAL_TABLET | ORAL | Status: AC
  Administered 2023-01-16 – 2023-01-17 (×4): 1 via ORAL
  Filled 2023-01-16 (×5): qty 1

## 2023-01-16 MED ORDER — DIPHENHYDRAMINE HCL 25 MG PO CAPS
25.0000 mg | ORAL_CAPSULE | Freq: Once | ORAL | Status: AC
Start: 2023-01-16 — End: 2023-01-16
  Administered 2023-01-16: 25 mg via ORAL
  Filled 2023-01-16: qty 1

## 2023-01-16 MED ORDER — GABAPENTIN 400 MG PO CAPS
800.0000 mg | ORAL_CAPSULE | Freq: Three times a day (TID) | ORAL | Status: DC
  Administered 2023-01-16 – 2023-01-18 (×5): 800 mg via ORAL
  Filled 2023-01-16 (×5): qty 2

## 2023-01-16 MED ORDER — CHLORHEXIDINE GLUCONATE CLOTH 2 % EX PADS
6.0000 | MEDICATED_PAD | Freq: Every day | CUTANEOUS | Status: DC
  Administered 2023-01-16 – 2023-01-23 (×7): 6 via TOPICAL

## 2023-01-16 MED ORDER — ALUM & MAG HYDROXIDE-SIMETH 200-200-20 MG/5ML PO SUSP
5.0000 mL | ORAL | Status: DC | PRN
Start: 2023-01-16 — End: 2023-01-23
  Administered 2023-01-18: 5 mL via ORAL
  Filled 2023-01-16: qty 30

## 2023-01-16 MED ORDER — ORAL CARE MOUTH RINSE
15.0000 mL | OROMUCOSAL | Status: DC | PRN
Start: 2023-01-16 — End: 2023-01-23

## 2023-01-16 NOTE — Progress Notes (Signed)
       CROSS COVER NOTE  NAME: Jennifer [name] MRN: [id number] DOB : 02-Nov-1980    Concern as stated by nurse / staff   Chat received from nurse Hgb 5.9     Pertinent findings on chart review: Chart reviewed  Assessment and  Interventions   Assessment: Previous 7.5 Plan: Blood transfusion dicussed with patient. Has had 4 transfusion in past,  no reaction 1 unit PRBC ordered for transfusion       Donnie Mesa NP [facility] Cross Cover 7pm-7am - check amion for availability Pager [phone]

## 2023-01-16 NOTE — Consult Note (Signed)
PHARMACY - ANTICOAGULATION CONSULT NOTE  Pharmacy Consult for IV Heparin Indication:  Limb ischemia  Patient Measurements: Height: 5\' 6"  (167.6 cm) Weight: 96.6 kg (213 lb) IBW/kg (Calculated) : 59.3 Heparin Dosing Weight: 80.9 kg  Labs: Recent Labs    01/15/23 0834 01/15/23 0925 01/16/23 0407  HGB 7.5*  --  5.9*  HCT 28.6*  --  22.6*  PLT 532*  --  465*  APTT  --  27  --   LABPROT  --  13.5  --   INR  --  1.0  --   CREATININE 1.33*  --  1.02*   Estimated Creatinine Clearance: 82.4 mL/min (A) (by C-G formula based on SCr of 1.02 mg/dL (H)).  Medical History: Past Medical History:  Diagnosis Date   Diabetes mellitus without complication (HCC)    Hypertension     Medications:  No anticoagulation prior to admission per my chart review  Assessment: 42 y/o F with medical history as above and including PAF on ASA admitted with acute right lower limb ischemia. Patient underwent right lower extremity angiogram with stenting of right popliteal artery and thrombectomy or right anterior tibial artery 12/12. Patient placed on Aggrastat post-operatively. Pharmacy now consulted to initiate heparin infusion for limb ischemia.  Baseline aPTT 27s, INR 1. CBC today notable for drop in Hgb from 7.5 to 5.9. Patient has been ordered blood transfusion.  Goal of Therapy:  Heparin level 0.3-0.7 units/ml Monitor platelets by anticoagulation protocol: Yes   Plan:  --Start heparin at 1000 units/hr --Heparin level 6 hours from initiation of infusion --Daily CBC per protocol  Tressie Ellis 01/16/2023,9:03 AM

## 2023-01-16 NOTE — Progress Notes (Signed)
[facility]  Daily Progress Note   Subjective  -   Still having persistent ischemic pain to the right foot.  The pain is not improved with overnight Aggrastat therapy and now on a heparin drip again.  Access site without hematoma.  Objective Vitals:   01/16/23 1100 01/16/23 1215 01/16/23 1230 01/16/23 1248  BP: (!) 151/120 (!) 153/86 (!) 148/81 (!) 149/81  Pulse: (!) 110 (!) 110 (!) 107   Resp: 13 17 19    Temp:   99.4 F (37.4 C)   TempSrc:   Oral   SpO2: 100% 100% 100%   Weight:      Height:        Intake/Output Summary (Last 24 hours) at 01/16/2023 1421 Last data filed at 01/16/2023 1338 Gross per 24 hour  Intake 1674.5 ml  Output 1125 ml  Net 549.5 ml    PULM  CTAB CV  tachycardic VASC  right foot cyanotic with fixed skin changes.  Warm to the mid to distal calf.  No pedal pulses.  Laboratory CBC    Component Value Date/Time   WBC 13.2 (H) 01/16/2023 0407   HGB 6.9 (L) 01/16/2023 1248   HGB 12.0 06/01/2013 1805   HCT 25.1 (L) 01/16/2023 1248   HCT 39.4 06/01/2013 1805   PLT 465 (H) 01/16/2023 0407   PLT 325 06/01/2013 1805    BMET    Component Value Date/Time   NA 130 (L) 01/16/2023 0407   NA 135 (L) 06/01/2013 1805   K 4.5 01/16/2023 0407   K 3.6 06/01/2013 1805   CL 103 01/16/2023 0407   CL 103 06/01/2013 1805   CO2 19 (L) 01/16/2023 0407   CO2 25 06/01/2013 1805   GLUCOSE 379 (H) 01/16/2023 0407   GLUCOSE 179 (H) 06/01/2013 1805   BUN 10 01/16/2023 0407   BUN 11 06/01/2013 1805   CREATININE 1.02 (H) 01/16/2023 0407   CREATININE 0.79 06/01/2013 1805   CALCIUM 8.1 (L) 01/16/2023 0407   CALCIUM 9.1 06/01/2013 1805   GFRNONAA >60 01/16/2023 0407   GFRNONAA >60 06/01/2013 1805   GFRAA >60 10/08/2016 1820   GFRAA >60 06/01/2013 1805    Assessment/Planning: POD #1 s/p right lower extremity angiogram, covered stent placement to the popliteal artery, and tibial mechanical thrombectomy.  She has persistent ischemic rest  pain of the right foot with fixed skin changes and not unreconstructable thrombosis of the distal tibial vessels Has had no improvement with Aggrastat or heparin and there is really not much further we can do from a revascularization standpoint She has come to terms with this and is understanding she will require a below-knee amputation.  Her flow should be adequate for healing at that level. She has already eaten lunch today and has a hemoglobin of 5.9 this morning.  After 1 unit of transfusion, her hemoglobin is 6.9.  This will preclude her below-knee amputation today. We will look for time tomorrow or Sunday and she is agreeable to have her below-knee amputation when she is medically stable   [name]  01/16/2023, 2:21 PM

## 2023-01-16 NOTE — Progress Notes (Signed)
PROGRESS NOTE    Jennifer [name]   KGM:[id number] DOB: 1980-12-25  DOA: 01/15/2023 Date of Service: 01/16/23 which is hospital day 1  PCP: [facility]    HPI: Jennifer [name] is a 42 y.o. female with medical history significant of PAF on aspirin, HTN, IDDM, chronic diabetic neuropathy, presented with worsening of right foot pain. chronic bilateral feet pain right more than left which she attributed to diabetic neuropathy and her gabapentin was increased recently to address that issue.  2 days ago patient started to have right calf pain, claudication worsening with ambulation and last night patient woke up with severe pain of right foot.   Hospital course / significant events:  12/12: to ED,  (+)acute right lower extremity ischemia.  Vascular surgery consulted and Dr. Wyn Quaker took pt to OR for angiogram which revealed "minimal atherosclerosis... a thrombotic/embolic near occlusive stenosis in the mid popliteal artery. The distal popliteal artery normalized in both the anterior tibial and posterior tibial arteries were initially patent but both occluded in the mid to distal segments and this was clearly thrombotic in nature. Essentially no flow was seen in the foot." --. stent placement to R popliteal A, mechanical thrombectomy to R anterior tibial A --> aggrastat drip. Note Hgb 7.5 on admission 12/13: Hgb 5.9, 2 unit PRBC. D/c aggrastat, back on heparin. Persistent ischemic pain. Plan for BKA planned for tomorrow pending correction of anemia.   Consultants:  Vascular surgery   Procedures/Surgeries: 01/15/23: RLE angiogram, stent placement to R popliteal A, mechanical thrombectomy to R anterior tibial A       ASSESSMENT & PLAN:  Acute right lower limb ischemia - suspect due to embolic event, underlying PVD.   POD1 S/p RLE angiography w/ stent to R popliteal A, thrombectomy to R anterior tibial A Persistent ischemic pain  Vascular surgery following  Heparin gtt   Plan for BKA Education on risk factor modification, including smoking cessation, medications, glucose management Continue statin Expect anticoagulation on discharge, likely w/ Eliquis  Pain control    Paroxysmal atrial fibrillation Pt has been on ASA alone, no anticoagulation  Continue Cardizem Likely will need Eliquis over aspirin on discharge   Chronic iron deficiency anemia Acute anemia w/ concern for ABLA d/t procedure  Diagnosed last year, not on any iron supplement.   2 unit PRBC 01/16/23  Consider iron IV dose and continue po supplementation on discharge  Caution on anticoagulation - monitor CBC/HH    HTN Stable continue home BP meds including HCTZ and Cardizem, lisinopril   IDDM with hyperglycemia Continue home dosage of 70/30 SSI   Diabetic neuropathy Continue gabapentin     Class 1 obesity based on BMI: Body mass index is 34.38 kg/m.  Underweight - under 18.5  normal weight - 18.5 to 24.9 overweight - 25 to 29.9 obese - 30 or more   DVT prophylaxis: on aggrastat drip IV fluids: no continuous IV fluids  Nutrition: diabetic/cardiac  Central lines / invasive devices: none  Code Status: FULL CODE ACP documentation reviewed: none on file in [facility]  TOC needs: TBD Barriers to dispo / significant pending items: on IV infusion medications, monitoring limb ischemia, expect will be here couple more days / need vascular clearance prior to discharge, anticipate d/c home +/- HH/DME              Subjective / Brief ROS:  Patient reports persistent severe pain RLE Denies CP/SOB.  Denies new weakness.  Tolerating diet .  Reports  no concerns w/ urination/defecation.   Family Communication: none at this time     Objective Findings:  Vitals:   01/16/23 1215 01/16/23 1230 01/16/23 1248 01/16/23 1300  BP: (!) 153/86 (!) 148/81 (!) 149/81 (!) 161/87  Pulse: (!) 110 (!) 107 (!) 109 (!) 114  Resp: 17 19 16 14   Temp:  99.4 F (37.4 C)    TempSrc:   Oral    SpO2: 100% 100% 97% 98%  Weight:      Height:        Intake/Output Summary (Last 24 hours) at 01/16/2023 1626 Last data filed at 01/16/2023 1338 Gross per 24 hour  Intake 1674.5 ml  Output 1125 ml  Net 549.5 ml   Filed Weights   01/15/23 0822 01/15/23 0838 01/15/23 1525  Weight: 96.6 kg 96.6 kg 96.6 kg    Examination:  Physical Exam Constitutional:      General: She is not in acute distress. Cardiovascular:     Rate and Rhythm: Regular rhythm. Tachycardia present.  Pulmonary:     Effort: Pulmonary effort is normal.     Breath sounds: Normal breath sounds.  Skin:    General: Skin is dry.     Comments: R foot cool  Neurological:     Mental Status: She is alert.          Scheduled Medications:   sodium chloride   Intravenous Once   sodium chloride   Intravenous Once   Chlorhexidine Gluconate Cloth  6 each Topical Daily   diltiazem  30 mg Oral Q6H   gabapentin  800 mg Oral TID   HYDROcodone-acetaminophen  1 tablet Oral Q4H   insulin aspart  0-20 Units Subcutaneous TID WC   insulin aspart  0-5 Units Subcutaneous QHS   methocarbamol  1,000 mg Oral TID   nicotine  21 mg Transdermal Daily    Continuous Infusions:  heparin 1,000 Units/hr (01/16/23 1256)   ondansetron (ZOFRAN) IV 8 mg (01/16/23 1612)    PRN Medications:  HYDROmorphone (DILAUDID) injection, ondansetron (ZOFRAN) IV  Antimicrobials from admission:  Anti-infectives (From admission, onward)    Start     Dose/Rate Route Frequency Ordered Stop   01/15/23 1459  ceFAZolin (ANCEF) IVPB 2g/100 mL premix        2 g 200 mL/hr over 30 Minutes Intravenous 30 min pre-op 01/15/23 1459 01/15/23 1646           Data Reviewed:  I have personally reviewed the following...  CBC: Recent Labs  Lab 01/15/23 0834 01/16/23 0407 01/16/23 1248  WBC 12.1* 13.2*  --   NEUTROABS 8.8*  --   --   HGB 7.5* 5.9* 6.9*  HCT 28.6* 22.6* 25.1*  MCV 63.7* 64.6*  --   PLT 532* 465*  --    Basic  Metabolic Panel: Recent Labs  Lab 01/15/23 0834 01/16/23 0407  NA 127* 130*  K 4.3 4.5  CL 97* 103  CO2 20* 19*  GLUCOSE 602* 379*  BUN 14 10  CREATININE 1.33* 1.02*  CALCIUM 9.1 8.1*   GFR: Estimated Creatinine Clearance: 82.4 mL/min (A) (by C-G formula based on SCr of 1.02 mg/dL (H)). Liver Function Tests: Recent Labs  Lab 01/15/23 0834  AST 16  ALT 12  ALKPHOS 70  BILITOT 0.3  PROT 8.0  ALBUMIN 3.5   No results for input(s): "LIPASE", "AMYLASE" in the last 168 hours. No results for input(s): "AMMONIA" in the last 168 hours. Coagulation Profile: Recent Labs  Lab 01/15/23 [phone]  INR 1.0   Cardiac Enzymes: No results for input(s): "CKTOTAL", "CKMB", "CKMBINDEX", "TROPONINI" in the last 168 hours. BNP (last 3 results) No results for input(s): "PROBNP" in the last 8760 hours. HbA1C: No results for input(s): "HGBA1C" in the last 72 hours. CBG: Recent Labs  Lab 01/15/23 1711 01/15/23 1845 01/15/23 2128 01/16/23 0804 01/16/23 1239  GLUCAP 316* 300* 298* 397* 357*   Lipid Profile: No results for input(s): "CHOL", "HDL", "LDLCALC", "TRIG", "CHOLHDL", "LDLDIRECT" in the last 72 hours. Thyroid Function Tests: No results for input(s): "TSH", "T4TOTAL", "FREET4", "T3FREE", "THYROIDAB" in the last 72 hours. Anemia Panel: Recent Labs    01/15/23 0834 01/15/23 1847  FERRITIN  --  5*  TIBC  --  424  IRON  --  13*  RETICCTPCT 2.6  --    Most Recent Urinalysis On File:     Component Value Date/Time   COLORURINE COLORLESS (A) 09/14/2022 1754   APPEARANCEUR CLEAR (A) 09/14/2022 1754   APPEARANCEUR Hazy 05/28/2013 2350   LABSPEC 1.021 09/14/2022 1754   LABSPEC 1.015 05/28/2013 2350   PHURINE 5.0 09/14/2022 1754   GLUCOSEU >=500 (A) 09/14/2022 1754   GLUCOSEU >=500 05/28/2013 2350   HGBUR NEGATIVE 09/14/2022 1754   BILIRUBINUR NEGATIVE 09/14/2022 1754   BILIRUBINUR Negative 05/28/2013 2350   KETONESUR NEGATIVE 09/14/2022 1754   PROTEINUR NEGATIVE 09/14/2022  1754   NITRITE NEGATIVE 09/14/2022 1754   LEUKOCYTESUR NEGATIVE 09/14/2022 1754   LEUKOCYTESUR Negative 05/28/2013 2350   Sepsis Labs: @LABRCNTIP (procalcitonin:4,lacticidven:4) Microbiology: Recent Results (from the past 240 hours)  MRSA Next Gen by PCR, Nasal     Status: None   Collection Time: 01/15/23  6:43 PM   Specimen: Nasal Mucosa; Nasal Swab  Result Value Ref Range Status   MRSA by PCR Next Gen NOT DETECTED NOT DETECTED Final    Comment: (NOTE) The GeneXpert MRSA Assay (FDA approved for NASAL specimens only), is one component of a comprehensive MRSA colonization surveillance program. It is not intended to diagnose MRSA infection nor to guide or monitor treatment for MRSA infections. Test performance is not FDA approved in patients less than 2 years old. Performed at [facility], [address], [place], [place] [address]       Radiology Studies last 3 days: ECHOCARDIOGRAM COMPLETE Result Date: 01/16/2023    ECHOCARDIOGRAM REPORT   Patient Name:   VANTASIA SIGNS Date of Exam: 01/16/2023 Medical Rec #:  [id number]       Height:       66.0 in Accession #:    [id number]      Weight:       213.0 lb Date of Birth:  11/15/1980       BSA:          2.054 m Patient Age:    42 years        BP:           142/80 mmHg Patient Gender: F               HR:           110 bpm. Exam Location:  [facility] Procedure: 2D Echo, Cardiac Doppler and Color Doppler Indications:     Atrial Fibrillation  History:         Patient has prior history of Echocardiogram examinations, most                  recent 05/24/2022. Arrythmias:Atrial Fibrillation; Risk  Factors:Hypertension, Sleep Apnea, Diabetes, Dyslipidemia and                  Current Smoker.  Sonographer:     Mikki Harbor Referring Phys:  [id number] Marlow Baars DEW Diagnosing Phys: Debbe Odea MD  Sonographer Comments: Image acquisition challenging due to respiratory motion. IMPRESSIONS  1. Left ventricular ejection fraction, by  estimation, is 65 to 70%. The left ventricle has normal function. The left ventricle has no regional wall motion abnormalities. There is mild left ventricular hypertrophy. Left ventricular diastolic parameters are consistent with Grade I diastolic dysfunction (impaired relaxation).  2. Right ventricular systolic function is normal. The right ventricular size is normal. There is normal pulmonary artery systolic pressure.  3. The mitral valve is normal in structure. No evidence of mitral valve regurgitation.  4. The aortic valve is tricuspid. Aortic valve regurgitation is not visualized.  5. The inferior vena cava is normal in size with greater than 50% respiratory variability, suggesting right atrial pressure of 3 mmHg. FINDINGS  Left Ventricle: Left ventricular ejection fraction, by estimation, is 65 to 70%. The left ventricle has normal function. The left ventricle has no regional wall motion abnormalities. The left ventricular internal cavity size was normal in size. There is  mild left ventricular hypertrophy. Left ventricular diastolic parameters are consistent with Grade I diastolic dysfunction (impaired relaxation). Right Ventricle: The right ventricular size is normal. No increase in right ventricular wall thickness. Right ventricular systolic function is normal. There is normal pulmonary artery systolic pressure. The tricuspid regurgitant velocity is 1.51 m/s, and  with an assumed right atrial pressure of 3 mmHg, the estimated right ventricular systolic pressure is 12.1 mmHg. Left Atrium: Left atrial size was normal in size. Right Atrium: Right atrial size was normal in size. Pericardium: There is no evidence of pericardial effusion. Mitral Valve: The mitral valve is normal in structure. No evidence of mitral valve regurgitation. MV peak gradient, 9.0 mmHg. The mean mitral valve gradient is 3.0 mmHg. Tricuspid Valve: The tricuspid valve is normal in structure. Tricuspid valve regurgitation is not  demonstrated. Aortic Valve: The aortic valve is tricuspid. Aortic valve regurgitation is not visualized. Aortic valve mean gradient measures 8.0 mmHg. Aortic valve peak gradient measures 16.6 mmHg. Aortic valve area, by VTI measures 2.78 cm. Pulmonic Valve: The pulmonic valve was not well visualized. Pulmonic valve regurgitation is not visualized. Aorta: The aortic root is normal in size and structure. Venous: The inferior vena cava is normal in size with greater than 50% respiratory variability, suggesting right atrial pressure of 3 mmHg. IAS/Shunts: No atrial level shunt detected by color flow Doppler.  LEFT VENTRICLE PLAX 2D LVIDd:         4.00 cm   Diastology LVIDs:         2.80 cm   LV e' medial:    6.42 cm/s LV PW:         1.60 cm   LV E/e' medial:  10.8 LV IVS:        1.50 cm   LV e' lateral:   9.14 cm/s LVOT diam:     2.00 cm   LV E/e' lateral: 7.6 LV SV:         95 LV SV Index:   46 LVOT Area:     3.14 cm  RIGHT VENTRICLE RV Basal diam:  3.30 cm RV Mid diam:    3.30 cm RV S prime:     23.00 cm/s TAPSE (M-mode): 2.3 cm LEFT  ATRIUM             Index        RIGHT ATRIUM           Index LA diam:        3.70 cm 1.80 cm/m   RA Area:     14.50 cm LA Vol (A2C):   62.0 ml 30.18 ml/m  RA Volume:   36.30 ml  17.67 ml/m LA Vol (A4C):   41.7 ml 20.30 ml/m LA Biplane Vol: 52.7 ml 25.66 ml/m  AORTIC VALVE                     PULMONIC VALVE AV Area (Vmax):    2.57 cm      PV Vmax:       1.50 m/s AV Area (Vmean):   2.44 cm      PV Peak grad:  9.0 mmHg AV Area (VTI):     2.78 cm AV Vmax:           204.00 cm/s AV Vmean:          130.000 cm/s AV VTI:            0.341 m AV Peak Grad:      16.6 mmHg AV Mean Grad:      8.0 mmHg LVOT Vmax:         167.00 cm/s LVOT Vmean:        101.000 cm/s LVOT VTI:          0.302 m LVOT/AV VTI ratio: 0.89  AORTA Ao Root diam: 3.40 cm MITRAL VALVE                TRICUSPID VALVE MV Area (PHT): 4.93 cm     TR Peak grad:   9.1 mmHg MV Area VTI:   3.82 cm     TR Vmax:        151.00 cm/s  MV Peak grad:  9.0 mmHg MV Mean grad:  3.0 mmHg     SHUNTS MV Vmax:       1.50 m/s     Systemic VTI:  0.30 m MV Vmean:      82.5 cm/s    Systemic Diam: 2.00 cm MV Decel Time: 154 msec MV E velocity: 69.20 cm/s MV A velocity: 148.00 cm/s MV E/A ratio:  0.47 Debbe Odea MD Electronically signed by Debbe Odea MD Signature Date/Time: 01/16/2023/4:23:22 PM    Final    PERIPHERAL VASCULAR CATHETERIZATION Result Date: 01/15/2023 See surgical note for result.        Sunnie Nielsen, DO [facility] 01/16/2023, 4:26 PM    Dictation software may have been used to generate the above note. Typos may occur and escape review in typed/dictated notes. Please contact Dr Lyn Hollingshead directly for clarity if needed.  Staff may message me via secure chat in Epic  but this may not receive an immediate response,  please page me for urgent matters!  If 7PM-7AM, please contact night coverage [email or web address]

## 2023-01-16 NOTE — Hospital Course (Addendum)
HPI: Jennifer [name] is a 42 y.o. female with medical history significant of PAF on aspirin, HTN, IDDM, chronic diabetic neuropathy, presented with worsening of right foot pain. chronic bilateral feet pain right more than left which she attributed to diabetic neuropathy and her gabapentin was increased recently to address that issue.  2 days ago patient started to have right calf pain, claudication worsening with ambulation and last night patient woke up with severe pain of right foot.   Hospital course / significant events:  12/12: to ED,  (+)acute right lower extremity ischemia.  Vascular surgery consulted and Dr. Wyn Quaker took pt to OR for angiogram which revealed "minimal atherosclerosis... a thrombotic/embolic near occlusive stenosis in the mid popliteal artery. The distal popliteal artery normalized in both the anterior tibial and posterior tibial arteries were initially patent but both occluded in the mid to distal segments and this was clearly thrombotic in nature. Essentially no flow was seen in the foot." --. stent placement to R popliteal A, mechanical thrombectomy to R anterior tibial A --> aggrastat drip. Note Hgb 7.5 on admission 12/13: Hgb 5.9, 2 unit PRBC. D/c aggrastat, back on heparin. Persistent ischemic pain. Plan for BKA planned for tomorrow pending correction of anemia.   Consultants:  Vascular surgery   Procedures/Surgeries: 01/15/23: RLE angiogram, stent placement to R popliteal A, mechanical thrombectomy to R anterior tibial A       ASSESSMENT & PLAN:  Acute right lower limb ischemia - suspect due to embolic event, underlying PVD.   POD1 S/p RLE angiography w/ stent to R popliteal A, thrombectomy to R anterior tibial A Persistent ischemic pain  Vascular surgery following  Heparin gtt  Plan for BKA Education on risk factor modification, including smoking cessation, medications, glucose management Continue statin Expect anticoagulation on discharge, likely w/ Eliquis   Pain control    Paroxysmal atrial fibrillation Pt has been on ASA alone, no anticoagulation  Continue Cardizem Likely will need Eliquis over aspirin on discharge   Chronic iron deficiency anemia Acute anemia w/ concern for ABLA d/t procedure  Diagnosed last year, not on any iron supplement.   2 unit PRBC 01/16/23  Consider iron IV dose and continue po supplementation on discharge  Caution on anticoagulation - monitor CBC/HH    HTN Stable continue home BP meds including HCTZ and Cardizem, lisinopril   IDDM with hyperglycemia Continue home dosage of 70/30 SSI   Diabetic neuropathy Continue gabapentin     Class 1 obesity based on BMI: Body mass index is 34.38 kg/m.  Underweight - under 18.5  normal weight - 18.5 to 24.9 overweight - 25 to 29.9 obese - 30 or more   DVT prophylaxis: on aggrastat drip IV fluids: no continuous IV fluids  Nutrition: diabetic/cardiac  Central lines / invasive devices: none  Code Status: FULL CODE ACP documentation reviewed: none on file in [facility]YNCA  TOC needs: TBD Barriers to dispo / significant pending items: on IV infusion medications, monitoring limb ischemia, expect will be here couple more days / need vascular clearance prior to discharge, anticipate d/c home +/- HH/DME

## 2023-01-16 NOTE — Consult Note (Signed)
PHARMACY - ANTICOAGULATION CONSULT NOTE  Pharmacy Consult for IV Heparin Indication:  Limb ischemia  Patient Measurements: Height: 5\' 6"  (167.6 cm) Weight: 96.6 kg (213 lb) IBW/kg (Calculated) : 59.3 Heparin Dosing Weight: 80.9 kg  Labs: Recent Labs    01/15/23 0834 01/15/23 0925 01/16/23 0407 01/16/23 1248 01/16/23 2121  HGB 7.5*  --  5.9* 6.9* 7.8*  HCT 28.6*  --  22.6* 25.1* 27.1*  PLT 532*  --  465*  --   --   APTT  --  27  --   --   --   LABPROT  --  13.5  --   --   --   INR  --  1.0  --   --   --   HEPARINUNFRC  --   --   --  <0.10* <0.10*  CREATININE 1.33*  --  1.02*  --   --    Estimated Creatinine Clearance: 82.4 mL/min (A) (by C-G formula based on SCr of 1.02 mg/dL (H)).  Medical History: Past Medical History:  Diagnosis Date   Diabetes mellitus without complication (HCC)    Hypertension     Medications:  No anticoagulation prior to admission per my chart review  Assessment: 42 y/o F with medical history as above and including PAF on ASA admitted with acute right lower limb ischemia. Patient underwent right lower extremity angiogram with stenting of right popliteal artery and thrombectomy or right anterior tibial artery 12/12. Patient placed on Aggrastat post-operatively. Pharmacy now consulted to initiate heparin infusion for limb ischemia.  Baseline aPTT 27s, INR 1. CBC today notable for drop in Hgb from 7.5 to 5.9. Patient has been ordered blood transfusion.  12/13 2121 HL < 0.1.   Goal of Therapy:  Heparin level 0.3-0.7 units/ml Monitor platelets by anticoagulation protocol: Yes   Plan:  Heparin level is subtherapeutic. Will give heparin bolus of 2500 units x 1 and increase heparin infusion to 1250 units/hr. Recheck heparin level in 6 hours. CBC daily while on heparin.  --Start heparin at 1000 units/hr --Heparin level 6 hours from initiation of infusion --Daily CBC per protocol  Ronnald Ramp 01/16/2023,9:56 PM

## 2023-01-16 NOTE — Progress Notes (Signed)
   01/16/23 1400  Spiritual Encounters  Type of Visit Initial  Care provided to: Patient  Conversation partners present during encounter Nurse  Referral source Patient request  Reason for visit Surgical  OnCall Visit Yes  Spiritual Framework  Presenting Themes Impactful experiences and emotions;Courage hope and growth;Goals in life/care;Rituals and practive  Community/Connection Family;Friend(s);Significant other  Patient Stress Factors Health changes;Loss  Interventions  Spiritual Care Interventions Made Established relationship of care and support;Compassionate presence;Reflective listening;Prayer;Encouragement;Self-care teaching;Normalization of emotions  Intervention Outcomes  Outcomes Connection to spiritual care;Awareness around self/spiritual resourses;Reduced anxiety;Reduced fear;Awareness of support   Chaplain went to see patient to talk to her about upcoming surgery. Patient is very scared and depressed about losing her foot. Chaplain offered her coping tools and words of hope and courage. Chaplain also prayed for the patient and told her that chaplain services is available for spiritual and emotional support when needed.

## 2023-01-16 NOTE — Progress Notes (Signed)
*  PRELIMINARY RESULTS* Echocardiogram 2D Echocardiogram has been performed.  Carolyne Fiscal 01/16/2023, 2:36 PM

## 2023-01-16 NOTE — Progress Notes (Signed)
Progress Note    01/16/2023 7:35 AM 1 Day Post-Op  Subjective:  Jennifer [name] is a 42 yo female now POD #1 from Aortogram with selective right lower extremity angiogram with stent placement to the right popliteal artery and mechanical thrombectomy  to the right anterior tibial artery. Patient is very uncomfortable in ICU this morning. She endorses 10/10 pain to her right foot. Endorses it remains cold all the time. We discussed amputation and she wishes to speak to her husband about it before making a decision. No other complaints vitals all remain stable.     Vitals:   01/16/23 0500 01/16/23 0620  BP:  (!) 151/81  Pulse: (!) 123   Resp: 16   Temp:    SpO2: 97%    Physical Exam: Cardiac:  RRR, Normal S1,S2 no murmurs  Lungs:  Clear on auscultation , normal non-labored breathing, without Rales, rhonchi, wheezing  Incisions:  Right groin puncture site with dressing clean dry and intact. No hematoma or seroma to note.  Extremities:  Right lower extremity without doppler pulses to DP and PT. Positive doppler popliteal pulse. Patient Calf to toes are cool to cold and painful on touch. Left lower extremity positive doppler pulses throughout. Warm to touch with decent capillary refill.  Abdomen:  Positive bowel sounds throughout, soft, non tender and non distended.  Neurologic: AAOX 3 answers all questions and follows commands appropriately.   CBC    Component Value Date/Time   WBC 13.2 (H) 01/16/2023 0407   RBC 3.50 (L) 01/16/2023 0407   HGB 5.9 (L) 01/16/2023 0407   HGB 12.0 06/01/2013 1805   HCT 22.6 (L) 01/16/2023 0407   HCT 39.4 06/01/2013 1805   PLT 465 (H) 01/16/2023 0407   PLT 325 06/01/2013 1805   MCV 64.6 (L) 01/16/2023 0407   MCV 71 (L) 06/01/2013 1805   MCH 16.9 (L) 01/16/2023 0407   MCHC 26.1 (L) 01/16/2023 0407   RDW 23.8 (H) 01/16/2023 0407   RDW 18.1 (H) 06/01/2013 1805   LYMPHSABS 2.1 01/15/2023 0834   MONOABS 0.8 01/15/2023 0834   EOSABS 0.3 01/15/2023 0834    BASOSABS 0.0 01/15/2023 0834    BMET    Component Value Date/Time   NA 130 (L) 01/16/2023 0407   NA 135 (L) 06/01/2013 1805   K 4.5 01/16/2023 0407   K 3.6 06/01/2013 1805   CL 103 01/16/2023 0407   CL 103 06/01/2013 1805   CO2 19 (L) 01/16/2023 0407   CO2 25 06/01/2013 1805   GLUCOSE 379 (H) 01/16/2023 0407   GLUCOSE 179 (H) 06/01/2013 1805   BUN 10 01/16/2023 0407   BUN 11 06/01/2013 1805   CREATININE 1.02 (H) 01/16/2023 0407   CREATININE 0.79 06/01/2013 1805   CALCIUM 8.1 (L) 01/16/2023 0407   CALCIUM 9.1 06/01/2013 1805   GFRNONAA >60 01/16/2023 0407   GFRNONAA >60 06/01/2013 1805   GFRAA >60 10/08/2016 1820   GFRAA >60 06/01/2013 1805    INR    Component Value Date/Time   INR 1.0 01/15/2023 0925     Intake/Output Summary (Last 24 hours) at 01/16/2023 0735 Last data filed at 01/16/2023 0636 Gross per 24 hour  Intake 714.39 ml  Output 875 ml  Net -160.61 ml     Assessment/Plan:  42 y.o. female is s/p Aortogram with selective right lower extremity angiogram with stent placement to the right popliteal artery and mechanical thrombectomy  to the right anterior tibial artery. 1 Day Post-Op   PLAN: Unfortunately  aortogram with selective right lower extremity angiogram was unsuccessful in terms of revascularizing the patient's right foot.  Vascular surgery at this time recommends below the knee amputation as angiogram was able to establish blood flow enough to heal a below the knee amputation. This morning in discussion patient wished to speak with her husband prior to proceeding with amputation. Continue pain medications as needed to help control her pain. Aggrastat infusion ends at noon time today patient will be converted over to a heparin infusion after completion of Aggrastat.  Pharmacy made aware  DVT prophylaxis: Aggrastat infusion  I discussed in detail with Dr. [name] MD the plan and he is in agreement with the plan.   Marcie [facility] 01/16/2023 7:35 AM

## 2023-01-16 NOTE — Inpatient Diabetes Management (Signed)
Inpatient Diabetes Program Recommendations  AACE/ADA: New Consensus Statement on Inpatient Glycemic Control (2015)  Target Ranges:  Prepandial:   less than 140 mg/dL      Peak postprandial:   less than 180 mg/dL (1-2 hours)      Critically ill patients:  140 - 180 mg/dL   Lab Results  Component Value Date   GLUCAP 397 (H) 01/16/2023   HGBA1C 9.5 (H) 05/23/2022    Review of Glycemic Control  Latest Reference Range & Units 01/15/23 15:39 01/15/23 17:11 01/15/23 18:45 01/15/23 21:28 01/16/23 08:04  Glucose-Capillary 70 - 99 mg/dL 213 (H) 086 (H) 578 (H) 298 (H) 397 (H)  (H): Data is abnormally high  Diabetes history: DM2 Outpatient Diabetes medications: 70/30 45 units TID Current orders for Inpatient glycemic control: Novolog 0-20 units TID and 0-5 units QHS  Inpatient Diabetes Program Recommendations:    Please consider:  Semglee 30 units every day Once eating add Novolog 4 units TID with meals if she consumes at least 50%  Will continue to follow while inpatient.  Thank you, Dulce Sellar, MSN, CDCES Diabetes Coordinator Inpatient Diabetes Program [phone] (team pager from 8a-5p)

## 2023-01-17 ENCOUNTER — Other Ambulatory Visit: Payer: Self-pay

## 2023-01-17 ENCOUNTER — Encounter
Admission: EM | Disposition: A | Discharge status: Rehabilitation Facility | Payer: Self-pay | Source: Home / Self Care | Attending: Obstetrics and Gynecology

## 2023-01-17 ENCOUNTER — Inpatient Hospital Stay: Payer: Medicaid Other

## 2023-01-17 ENCOUNTER — Inpatient Hospital Stay: Payer: Medicaid Other | Admitting: Anesthesiology

## 2023-01-17 ENCOUNTER — Other Ambulatory Visit: Payer: Self-pay | Admitting: Vascular Surgery

## 2023-01-17 DIAGNOSIS — I998 Other disorder of circulatory system: Secondary | ICD-10-CM | POA: Diagnosis not present

## 2023-01-17 HISTORY — PX: AMPUTATION: SHX166

## 2023-01-17 LAB — GLUCOSE, CAPILLARY
Glucose-Capillary: 425 mg/dL — ABNORMAL HIGH (ref 70–99)
Glucose-Capillary: 374 mg/dL — ABNORMAL HIGH (ref 70–99)
Glucose-Capillary: 432 mg/dL — ABNORMAL HIGH (ref 70–99)
Glucose-Capillary: 272 mg/dL — ABNORMAL HIGH (ref 70–99)
Glucose-Capillary: 267 mg/dL — ABNORMAL HIGH (ref 70–99)
Glucose-Capillary: 290 mg/dL — ABNORMAL HIGH (ref 70–99)
Glucose-Capillary: 339 mg/dL — ABNORMAL HIGH (ref 70–99)

## 2023-01-17 LAB — CBC
HCT: 26.2 % — ABNORMAL LOW (ref 36.0–46.0)
Hemoglobin: 7.6 g/dL — ABNORMAL LOW (ref 12.0–15.0)
MCH: 19.4 pg — ABNORMAL LOW (ref 26.0–34.0)
MCHC: 29 g/dL — ABNORMAL LOW (ref 30.0–36.0)
MCV: 67 fL — ABNORMAL LOW (ref 80.0–100.0)
Platelets: 342 K/uL (ref 150–400)
RBC: 3.91 MIL/uL (ref 3.87–5.11)
RDW: 24.6 % — ABNORMAL HIGH (ref 11.5–15.5)
WBC: 12.8 K/uL — ABNORMAL HIGH (ref 4.0–10.5)
nRBC: 0 % (ref 0.0–0.2)

## 2023-01-17 LAB — HEPARIN LEVEL (UNFRACTIONATED): Heparin Unfractionated: <0.1 [IU]/mL — ABNORMAL LOW (ref 0.30–0.70)

## 2023-01-17 LAB — PREPARE RBC (CROSSMATCH)

## 2023-01-17 LAB — BASIC METABOLIC PANEL
Anion gap: 8 (ref 5–15)
BUN: 10 mg/dL (ref 6–20)
CO2: 21 mmol/L — ABNORMAL LOW (ref 22–32)
Calcium: 8.6 mg/dL — ABNORMAL LOW (ref 8.9–10.3)
Chloride: 101 mmol/L (ref 98–111)
Creatinine, Ser: 1.18 mg/dL — ABNORMAL HIGH (ref 0.44–1.00)
GFR, Estimated: 58 mL/min — ABNORMAL LOW (ref >60)
Glucose, Bld: 428 mg/dL — ABNORMAL HIGH (ref 70–99)
Potassium: 4.6 mmol/L (ref 3.5–5.1)
Sodium: 130 mmol/L — ABNORMAL LOW (ref 135–145)

## 2023-01-17 LAB — HEMOGLOBIN AND HEMATOCRIT, BLOOD
HCT: 27.5 % — ABNORMAL LOW (ref 36.0–46.0)
Hemoglobin: 8.2 g/dL — ABNORMAL LOW (ref 12.0–15.0)

## 2023-01-17 SURGERY — AMPUTATION BELOW KNEE
Anesthesia: General | Site: Knee | Laterality: Right

## 2023-01-17 MED ORDER — BUPIVACAINE LIPOSOME 1.3 % IJ SUSP
INTRAMUSCULAR | Status: DC | PRN
  Administered 2023-01-17: 20 mL

## 2023-01-17 MED ORDER — VASHE WOUND IRRIGATION OPTIME
TOPICAL | Status: DC | PRN
Start: 2023-01-17 — End: 2023-01-17
  Administered 2023-01-17: 34 [oz_av]

## 2023-01-17 MED ORDER — SODIUM CHLORIDE 0.9% IV SOLUTION: Freq: Once | INTRAVENOUS | Status: DC

## 2023-01-17 MED ORDER — INSULIN ASPART 100 UNIT/ML IJ SOLN
12.0000 [IU] | Freq: Once | INTRAMUSCULAR | Status: AC
  Administered 2023-01-17: 12 [IU] via SUBCUTANEOUS

## 2023-01-17 MED ORDER — BUPIVACAINE HCL (PF) 0.5 % IJ SOLN
INTRAMUSCULAR | Status: AC
Start: 2023-01-17 — End: ?
  Filled 2023-01-17: qty 30

## 2023-01-17 MED ORDER — HYDROMORPHONE HCL 1 MG/ML IJ SOLN
INTRAMUSCULAR | Status: AC
  Filled 2023-01-17: qty 1

## 2023-01-17 MED ORDER — DILTIAZEM HCL 60 MG PO TABS
30.0000 mg | ORAL_TABLET | Freq: Four times a day (QID) | ORAL | Status: DC
  Filled 2023-01-17: qty 0.5

## 2023-01-17 MED ORDER — SODIUM CHLORIDE 0.9 % IV SOLN: INTRAVENOUS | Status: DC | PRN

## 2023-01-17 MED ORDER — MIDAZOLAM HCL 2 MG/2ML IJ SOLN
INTRAMUSCULAR | Status: AC
  Filled 2023-01-17: qty 2

## 2023-01-17 MED ORDER — FENTANYL CITRATE (PF) 100 MCG/2ML IJ SOLN
INTRAMUSCULAR | Status: DC | PRN
  Administered 2023-01-17: 50 ug via INTRAVENOUS

## 2023-01-17 MED ORDER — DILTIAZEM HCL 30 MG PO TABS
30.0000 mg | ORAL_TABLET | Freq: Four times a day (QID) | ORAL | Status: DC
  Filled 2023-01-17: qty 1

## 2023-01-17 MED ORDER — INSULIN ASPART 100 UNIT/ML IJ SOLN: 22.0000 [IU] | Freq: Once | INTRAMUSCULAR | Status: DC

## 2023-01-17 MED ORDER — PROPOFOL 10 MG/ML IV BOLUS
INTRAVENOUS | Status: DC | PRN
  Administered 2023-01-17: 120 mg via INTRAVENOUS

## 2023-01-17 MED ORDER — BUPIVACAINE LIPOSOME 1.3 % IJ SUSP
20.0000 mL | Freq: Once | INTRAMUSCULAR | Status: AC
  Administered 2023-01-17: 266 mg

## 2023-01-17 MED ORDER — BUPIVACAINE LIPOSOME 1.3 % IJ SUSP
INTRAMUSCULAR | Status: AC
  Filled 2023-01-17: qty 20

## 2023-01-17 MED ORDER — INSULIN ASPART 100 UNIT/ML IJ SOLN
INTRAMUSCULAR | Status: AC
  Filled 2023-01-17: qty 1

## 2023-01-17 MED ORDER — LIDOCAINE HCL (CARDIAC) PF 100 MG/5ML IV SOSY
PREFILLED_SYRINGE | INTRAVENOUS | Status: DC | PRN
  Administered 2023-01-17: 60 mg via INTRAVENOUS

## 2023-01-17 MED ORDER — EPINEPHRINE PF 1 MG/ML IJ SOLN
INTRAMUSCULAR | Status: DC | PRN
  Administered 2023-01-17: 30 mL

## 2023-01-17 MED ORDER — ONDANSETRON HCL 4 MG/2ML IJ SOLN
INTRAMUSCULAR | Status: DC | PRN
  Administered 2023-01-17: 4 mg via INTRAVENOUS

## 2023-01-17 MED ORDER — FENTANYL CITRATE (PF) 100 MCG/2ML IJ SOLN
INTRAMUSCULAR | Status: AC
  Filled 2023-01-17: qty 2

## 2023-01-17 MED ORDER — HYDROMORPHONE HCL 1 MG/ML IJ SOLN
0.2500 mg | INTRAMUSCULAR | Status: DC | PRN
  Administered 2023-01-17 (×2): 0.5 mg via INTRAVENOUS

## 2023-01-17 MED ORDER — INSULIN ASPART 100 UNIT/ML IJ SOLN
5.0000 [IU] | Freq: Three times a day (TID) | INTRAMUSCULAR | Status: DC
Start: 2023-01-17 — End: 2023-01-18
  Administered 2023-01-17: 5 [IU] via SUBCUTANEOUS
  Filled 2023-01-17: qty 1

## 2023-01-17 MED ORDER — EPINEPHRINE PF 1 MG/ML IJ SOLN
INTRAMUSCULAR | Status: AC
  Filled 2023-01-17: qty 1

## 2023-01-17 MED ORDER — DILTIAZEM HCL 60 MG PO TABS
60.0000 mg | ORAL_TABLET | Freq: Four times a day (QID) | ORAL | Status: DC
  Administered 2023-01-17 – 2023-01-19 (×8): 60 mg via ORAL
  Filled 2023-01-17 (×9): qty 1

## 2023-01-17 MED ORDER — MORPHINE SULFATE (PF) 4 MG/ML IV SOLN
4.0000 mg | Freq: Once | INTRAVENOUS | Status: AC
  Administered 2023-01-17: 4 mg via INTRAVENOUS
  Filled 2023-01-17: qty 1

## 2023-01-17 MED ORDER — PHENYLEPHRINE 80 MCG/ML (10ML) SYRINGE FOR IV PUSH (FOR BLOOD PRESSURE SUPPORT)
PREFILLED_SYRINGE | INTRAVENOUS | Status: DC | PRN
  Administered 2023-01-17 (×2): 80 ug via INTRAVENOUS

## 2023-01-17 MED ORDER — SUGAMMADEX SODIUM 200 MG/2ML IV SOLN
INTRAVENOUS | Status: DC | PRN
  Administered 2023-01-17: 200 mg via INTRAVENOUS

## 2023-01-17 MED ORDER — MIDAZOLAM HCL 2 MG/2ML IJ SOLN
INTRAMUSCULAR | Status: DC | PRN
  Administered 2023-01-17: 2 mg via INTRAVENOUS

## 2023-01-17 MED ORDER — HEPARIN BOLUS VIA INFUSION
2500.0000 [IU] | Freq: Once | INTRAVENOUS | Status: AC
  Administered 2023-01-17: 2500 [IU] via INTRAVENOUS
  Filled 2023-01-17: qty 2500

## 2023-01-17 MED ORDER — APIXABAN 5 MG PO TABS
5.0000 mg | ORAL_TABLET | Freq: Two times a day (BID) | ORAL | Status: DC
Start: 2023-01-18 — End: 2023-01-23
  Administered 2023-01-18 – 2023-01-23 (×11): 5 mg via ORAL
  Filled 2023-01-17 (×11): qty 1

## 2023-01-17 MED ORDER — LIDOCAINE HCL (PF) 1 % IJ SOLN
INTRAMUSCULAR | Status: AC
  Filled 2023-01-17: qty 5

## 2023-01-17 MED ORDER — INSULIN ASPART 100 UNIT/ML IJ SOLN
10.0000 [IU] | Freq: Once | INTRAMUSCULAR | Status: AC
  Administered 2023-01-17: 10 [IU] via SUBCUTANEOUS
  Filled 2023-01-17: qty 1

## 2023-01-17 MED ORDER — HYDROCODONE-ACETAMINOPHEN 10-325 MG PO TABS
1.0000 | ORAL_TABLET | ORAL | Status: DC | PRN
  Administered 2023-01-17 – 2023-01-18 (×6): 1 via ORAL
  Filled 2023-01-17 (×8): qty 1

## 2023-01-17 MED ORDER — CEFAZOLIN SODIUM-DEXTROSE 2-3 GM-%(50ML) IV SOLR
INTRAVENOUS | Status: DC | PRN
  Administered 2023-01-17: 2 g via INTRAVENOUS

## 2023-01-17 MED ORDER — 0.9 % SODIUM CHLORIDE (POUR BTL) OPTIME
TOPICAL | Status: DC | PRN
  Administered 2023-01-17: 150 mL

## 2023-01-17 MED ORDER — ROCURONIUM BROMIDE 100 MG/10ML IV SOLN
INTRAVENOUS | Status: DC | PRN
  Administered 2023-01-17: 50 mg via INTRAVENOUS

## 2023-01-17 MED ORDER — MORPHINE SULFATE (PF) 4 MG/ML IV SOLN
4.0000 mg | INTRAVENOUS | Status: AC | PRN
  Administered 2023-01-17: 4 mg via INTRAVENOUS
  Filled 2023-01-17 (×2): qty 1

## 2023-01-17 MED ORDER — LIDOCAINE HCL (PF) 1 % IJ SOLN
INTRAMUSCULAR | Status: DC | PRN
  Administered 2023-01-17: 3 mL

## 2023-01-17 MED ORDER — ACETAMINOPHEN 10 MG/ML IV SOLN: INTRAVENOUS | Status: DC | PRN

## 2023-01-17 SURGICAL SUPPLY — 40 items
BLADE SAGITTAL WIDE XTHICK NO (BLADE) IMPLANT
BLADE SAW SAG 25.4X90 (BLADE) ×2 IMPLANT
BLADE SURG SZ10 CARB STEEL (BLADE) IMPLANT
BNDG COHESIVE 4X5 TAN STRL LF (GAUZE/BANDAGES/DRESSINGS) ×2 IMPLANT
BNDG ELASTIC 6X5.8 VLCR NS LF (GAUZE/BANDAGES/DRESSINGS) ×2 IMPLANT
BNDG ESMARCH 6X12 STRL LF (GAUZE/BANDAGES/DRESSINGS) IMPLANT
BNDG GAUZE DERMACEA FLUFF 4 (GAUZE/BANDAGES/DRESSINGS) ×4 IMPLANT
BRUSH SCRUB EZ 4% CHG (MISCELLANEOUS) ×2 IMPLANT
CHLORAPREP W/TINT 26 (MISCELLANEOUS) ×2 IMPLANT
CLEANSER WND VASHE 34 (WOUND CARE) IMPLANT
CUFF TRNQT CYL 30X4X21-28X (TOURNIQUET CUFF) IMPLANT
ELECT CAUTERY BLADE 6.4 (BLADE) ×2 IMPLANT
ELECT REM PT RETURN 9FT ADLT (ELECTROSURGICAL) ×1
ELECTRODE REM PT RTRN 9FT ADLT (ELECTROSURGICAL) ×2 IMPLANT
GLOVE BIO SURGEON STRL SZ8 (GLOVE) IMPLANT
GLOVE BIOGEL PI IND STRL 8 (GLOVE) IMPLANT
GOWN STRL REUS W/ TWL LRG LVL3 (GOWN DISPOSABLE) ×4 IMPLANT
GOWN STRL REUS W/TWL 2XL LVL3 (GOWN DISPOSABLE) ×2 IMPLANT
GRADUATE 1200CC STRL 31836 (MISCELLANEOUS) IMPLANT
HANDLE YANKAUER SUCT BULB TIP (MISCELLANEOUS) ×2 IMPLANT
IMMBOLIZER KNEE 19 BLUE UNIV (SOFTGOODS) IMPLANT
KIT TURNOVER KIT A (KITS) ×2 IMPLANT
LABEL OR SOLS (LABEL) ×2 IMPLANT
MANIFOLD NEPTUNE II (INSTRUMENTS) ×2 IMPLANT
MAT ABSORB FLUID 56X50 GRAY (MISCELLANEOUS) ×2 IMPLANT
NS IRRIG 1000ML POUR BTL (IV SOLUTION) ×2 IMPLANT
PACK EXTREMITY ARMC (MISCELLANEOUS) ×2 IMPLANT
PAD ABD DERMACEA PRESS 5X9 (GAUZE/BANDAGES/DRESSINGS) ×4 IMPLANT
PAD PREP OB/GYN DISP 24X41 (PERSONAL CARE ITEMS) ×2 IMPLANT
SPONGE T-LAP 18X18 ~~LOC~~+RFID (SPONGE) ×2 IMPLANT
STAPLER SKIN PROX 35W (STAPLE) ×2 IMPLANT
STOCKINETTE M/LG 89821 (MISCELLANEOUS) ×2 IMPLANT
SUT MNCRL 3-0 UNDYED SH (SUTURE) IMPLANT
SUT SILK 2 0 SH (SUTURE) ×4 IMPLANT
SUT SILK 2-0 18XBRD TIE 12 (SUTURE) ×2 IMPLANT
SUT SILK 3-0 18XBRD TIE 12 (SUTURE) ×2 IMPLANT
SUT VIC AB 0 CT1 36 (SUTURE) ×4 IMPLANT
SUT VIC AB 2-0 CT1 (SUTURE) ×4 IMPLANT
TRAP FLUID SMOKE EVACUATOR (MISCELLANEOUS) ×2 IMPLANT
WATER STERILE IRR 500ML POUR (IV SOLUTION) ×2 IMPLANT

## 2023-01-17 NOTE — Progress Notes (Signed)
PROGRESS NOTE    Jennifer [name]   WUJ:[id number] DOB: February 20, 1980  DOA: 01/15/2023 Date of Service: 01/17/23 which is hospital day 2  [facility]P: [facility]    HPI: Jennifer [name] is a 42 y.o. female with medical history significant of PAF on aspirin, HTN, IDDM, chronic diabetic neuropathy, presented with worsening of right foot pain. chronic bilateral feet pain right more than left which she attributed to diabetic neuropathy and her gabapentin was increased recently to address that issue.  2 days ago patient started to have right calf pain, claudication worsening with ambulation and last night patient woke up with severe pain of right foot.   Hospital course / significant events:  12/12: to ED,  (+)acute right lower extremity ischemia.  Vascular surgery consulted and Dr. Wyn Quaker took pt to OR for angiogram w/ stent placement to R popliteal A, mechanical thrombectomy to R anterior tibial A --> aggrastat drip. Note Hgb 7.5 on admission. Consideration for BKA 12/13: Foot remains cyanotic. Hgb 5.9, 2 unit PRBC. D/c aggrastat, back on heparin. Persistent ischemic pain, pt amenable to amputation. Plan for BKA planned for tomorrow pending correction of anemia.  12/14: additional unit PRBC this morning. R BKA. Pain control continues to be a challenge   Consultants:  Vascular surgery   Procedures/Surgeries: 01/15/23: RLE angiogram, stent placement to R popliteal A, mechanical thrombectomy to R anterior tibial A  01/17/23: Right lower extremity below knee amputation, anesthesia also performed adductor canal nerve block      ASSESSMENT & PLAN:  Acute right lower limb ischemia - suspect due to embolic event, underlying PVD.   POD1 S/p RLE angiography w/ stent to R popliteal A, thrombectomy to R anterior tibial A Persistent ischemic pain  Vascular surgery following  Heparin gtt  Plan for BKA Education on risk factor modification, including smoking cessation, medications,  glucose management Continue statin Expect anticoagulation on discharge, likely w/ Eliquis  Pain control - morhpine 4 mg IV q2h x2 then back off to 2mg  q2h prn severe pain in addition to hydrocodone 10 mg po prn moderate pain - can use both    Paroxysmal atrial fibrillation Pt has been on ASA alone, no anticoagulation  Continue Cardizem = increased dose today given persistent tachycardia and BP above goal  Eliquis to start tomorrow    Chronic iron deficiency anemia Acute anemia w/ concern for ABLA d/t procedure  Diagnosed last year, not on any iron supplement.   2 unit PRBC 01/16/23, 1 unit PRBC 01/17/23  Consider iron IV dose and continue po supplementation on discharge  Caution on anticoagulation - monitor CBC/HH    HTN Stable continue home BP meds including HCTZ and Cardizem, lisinopril   IDDM with hyperglycemia Continue home dosage of 70/30 SSI   Diabetic neuropathy Continue gabapentin     Class 1 obesity based on BMI: Body mass index is 34.38 kg/m.  Underweight - under 18.5  normal weight - 18.5 to 24.9 overweight - 25 to 29.9 obese - 30 or more   DVT prophylaxis: on aggrastat drip IV fluids: no continuous IV fluids  Nutrition: diabetic/cardiac  Central lines / invasive devices: none  Code Status: FULL CODE ACP documentation reviewed: none on file in [facility]  TOC needs: TBD Barriers to dispo / significant pending items: postop requiring IV pain meds, likely will need HH/SNF              Subjective / Brief ROS:  Patient reports persistent severe pain RLE Denies  CP/SOB.  Denies new weakness.  Tolerating diet .  Reports no concerns w/ urination/defecation.   Family Communication: none at this time     Objective Findings:  Vitals:   01/17/23 1400 01/17/23 1435 01/17/23 1445 01/17/23 1550  BP: (!) 172/99 (!) 163/88 (!) 150/91 (!) 169/93  Pulse: (!) 108 (!) 107 (!) 105 (!) 102  Resp: 17 19 15 18   Temp:    99.1 F (37.3 C)  TempSrc:    Oral   SpO2: 98% 98% 96% 94%  Weight:      Height:        Intake/Output Summary (Last 24 hours) at 01/17/2023 1624 Last data filed at 01/17/2023 1300 Gross per 24 hour  Intake 1211.72 ml  Output 100 ml  Net 1111.72 ml   Filed Weights   01/15/23 0822 01/15/23 0838 01/15/23 1525  Weight: 96.6 kg 96.6 kg 96.6 kg    Examination:  Physical Exam Constitutional:      General: She is not in acute distress. Cardiovascular:     Rate and Rhythm: Regular rhythm. Tachycardia present.  Pulmonary:     Effort: Pulmonary effort is normal.     Breath sounds: Normal breath sounds.  Skin:    General: Skin is dry.     Comments: R foot cool  Neurological:     General: No focal deficit present.     Mental Status: She is alert.  Psychiatric:        Mood and Affect: Mood normal.        Behavior: Behavior normal.          Scheduled Medications:   sodium chloride   Intravenous Once   sodium chloride   Intravenous Once   sodium chloride   Intravenous Once   sodium chloride   Intravenous Once   [START ON 01/18/2023] apixaban  5 mg Oral BID   Chlorhexidine Gluconate Cloth  6 each Topical Daily   diltiazem  60 mg Oral Q6H   gabapentin  800 mg Oral TID   insulin aspart  0-20 Units Subcutaneous TID WC   insulin aspart  0-5 Units Subcutaneous QHS   insulin aspart  5 Units Subcutaneous TID WC   methocarbamol  1,000 mg Oral TID    morphine injection  4 mg Intravenous Once   nicotine  21 mg Transdermal Daily    Continuous Infusions:  ondansetron (ZOFRAN) IV 8 mg (01/17/23 0250)    PRN Medications:  alum & mag hydroxide-simeth, HYDROcodone-acetaminophen, ondansetron (ZOFRAN) IV, mouth rinse  Antimicrobials from admission:  Anti-infectives (From admission, onward)    Start     Dose/Rate Route Frequency Ordered Stop   01/15/23 1459  ceFAZolin (ANCEF) IVPB 2g/100 mL premix        2 g 200 mL/hr over 30 Minutes Intravenous 30 min pre-op 01/15/23 1459 01/15/23 1646           Data  Reviewed:  I have personally reviewed the following...  CBC: Recent Labs  Lab 01/15/23 0834 01/16/23 0407 01/16/23 1248 01/16/23 2121 01/17/23 0416 01/17/23 1453  WBC 12.1* 13.2*  --   --  12.8*  --   NEUTROABS 8.8*  --   --   --   --   --   HGB 7.5* 5.9* 6.9* 7.8* 7.6* 8.2*  HCT 28.6* 22.6* 25.1* 27.1* 26.2* 27.5*  MCV 63.7* 64.6*  --   --  67.0*  --   PLT 532* 465*  --   --  342  --  Basic Metabolic Panel: Recent Labs  Lab 01/15/23 0834 01/16/23 0407 01/17/23 0416  NA 127* 130* 130*  K 4.3 4.5 4.6  CL 97* 103 101  CO2 20* 19* 21*  GLUCOSE 602* 379* 428*  BUN 14 10 10   CREATININE 1.33* 1.02* 1.18*  CALCIUM 9.1 8.1* 8.6*   GFR: Estimated Creatinine Clearance: 71.3 mL/min (A) (by C-G formula based on SCr of 1.18 mg/dL (H)). Liver Function Tests: Recent Labs  Lab 01/15/23 0834  AST 16  ALT 12  ALKPHOS 70  BILITOT 0.3  PROT 8.0  ALBUMIN 3.5   No results for input(s): "LIPASE", "AMYLASE" in the last 168 hours. No results for input(s): "AMMONIA" in the last 168 hours. Coagulation Profile: Recent Labs  Lab 01/15/23 0925  INR 1.0   Cardiac Enzymes: No results for input(s): "CKTOTAL", "CKMB", "CKMBINDEX", "TROPONINI" in the last 168 hours. BNP (last 3 results) No results for input(s): "PROBNP" in the last 8760 hours. HbA1C: No results for input(s): "HGBA1C" in the last 72 hours. CBG: Recent Labs  Lab 01/17/23 0850 01/17/23 0937 01/17/23 1015 01/17/23 1219 01/17/23 1332  GLUCAP 425* 432* 374* 272* 267*   Lipid Profile: No results for input(s): "CHOL", "HDL", "LDLCALC", "TRIG", "CHOLHDL", "LDLDIRECT" in the last 72 hours. Thyroid Function Tests: No results for input(s): "TSH", "T4TOTAL", "FREET4", "T3FREE", "THYROIDAB" in the last 72 hours. Anemia Panel: Recent Labs    01/15/23 0834 01/15/23 1847  FERRITIN  --  5*  TIBC  --  424  IRON  --  13*  RETICCTPCT 2.6  --    Most Recent Urinalysis On File:     Component Value Date/Time    COLORURINE COLORLESS (A) 09/14/2022 1754   APPEARANCEUR CLEAR (A) 09/14/2022 1754   APPEARANCEUR Hazy 05/28/2013 2350   LABSPEC 1.021 09/14/2022 1754   LABSPEC 1.015 05/28/2013 2350   PHURINE 5.0 09/14/2022 1754   GLUCOSEU >=500 (A) 09/14/2022 1754   GLUCOSEU >=500 05/28/2013 2350   HGBUR NEGATIVE 09/14/2022 1754   BILIRUBINUR NEGATIVE 09/14/2022 1754   BILIRUBINUR Negative 05/28/2013 2350   KETONESUR NEGATIVE 09/14/2022 1754   PROTEINUR NEGATIVE 09/14/2022 1754   NITRITE NEGATIVE 09/14/2022 1754   LEUKOCYTESUR NEGATIVE 09/14/2022 1754   LEUKOCYTESUR Negative 05/28/2013 2350   Sepsis Labs: @LABRCNTIP (procalcitonin:4,lacticidven:4) Microbiology: Recent Results (from the past 240 hours)  MRSA Next Gen by PCR, Nasal     Status: None   Collection Time: 01/15/23  6:43 PM   Specimen: Nasal Mucosa; Nasal Swab  Result Value Ref Range Status   MRSA by PCR Next Gen NOT DETECTED NOT DETECTED Final    Comment: (NOTE) The GeneXpert MRSA Assay (FDA approved for NASAL specimens only), is one component of a comprehensive MRSA colonization surveillance program. It is not intended to diagnose MRSA infection nor to guide or monitor treatment for MRSA infections. Test performance is not FDA approved in patients less than 44 years old. Performed at [facility], [address] [place], [place] [address]       Radiology Studies last 3 days: [place] OR NERVE BLOCK-IMAGE ONLY [facility]) Result Date: 01/17/2023 There is no interpretation for this exam.  This order is for images obtained during a surgical procedure.  Please See "Surgeries" Tab for more information regarding the procedure.   ECHOCARDIOGRAM COMPLETE Result Date: 01/16/2023    ECHOCARDIOGRAM REPORT   Patient Name:   Jennifer [name] Date of Exam: 01/16/2023 Medical Rec #:  [id number]       Height:  66.0 in Accession #:    [id number]      Weight:       213.0 lb Date of Birth:  05-24-1980       BSA:          2.054 m Patient Age:     42 years        BP:           142/80 mmHg Patient Gender: F               HR:           110 bpm. Exam Location:  [facility] Procedure: 2D Echo, Cardiac Doppler and Color Doppler Indications:     Atrial Fibrillation  History:         Patient has prior history of Echocardiogram examinations, most                  recent 05/24/2022. Arrythmias:Atrial Fibrillation; Risk                  Factors:Hypertension, Sleep Apnea, Diabetes, Dyslipidemia and                  Current Smoker.  Sonographer:     Mikki Harbor Referring Phys:  [id number] Marlow Baars DEW Diagnosing Phys: Debbe Odea MD  Sonographer Comments: Image acquisition challenging due to respiratory motion. IMPRESSIONS  1. Left ventricular ejection fraction, by estimation, is 65 to 70%. The left ventricle has normal function. The left ventricle has no regional wall motion abnormalities. There is mild left ventricular hypertrophy. Left ventricular diastolic parameters are consistent with Grade I diastolic dysfunction (impaired relaxation).  2. Right ventricular systolic function is normal. The right ventricular size is normal. There is normal pulmonary artery systolic pressure.  3. The mitral valve is normal in structure. No evidence of mitral valve regurgitation.  4. The aortic valve is tricuspid. Aortic valve regurgitation is not visualized.  5. The inferior vena cava is normal in size with greater than 50% respiratory variability, suggesting right atrial pressure of 3 mmHg. FINDINGS  Left Ventricle: Left ventricular ejection fraction, by estimation, is 65 to 70%. The left ventricle has normal function. The left ventricle has no regional wall motion abnormalities. The left ventricular internal cavity size was normal in size. There is  mild left ventricular hypertrophy. Left ventricular diastolic parameters are consistent with Grade I diastolic dysfunction (impaired relaxation). Right Ventricle: The right ventricular size is normal. No increase in right ventricular  wall thickness. Right ventricular systolic function is normal. There is normal pulmonary artery systolic pressure. The tricuspid regurgitant velocity is 1.51 m/s, and  with an assumed right atrial pressure of 3 mmHg, the estimated right ventricular systolic pressure is 12.1 mmHg. Left Atrium: Left atrial size was normal in size. Right Atrium: Right atrial size was normal in size. Pericardium: There is no evidence of pericardial effusion. Mitral Valve: The mitral valve is normal in structure. No evidence of mitral valve regurgitation. MV peak gradient, 9.0 mmHg. The mean mitral valve gradient is 3.0 mmHg. Tricuspid Valve: The tricuspid valve is normal in structure. Tricuspid valve regurgitation is not demonstrated. Aortic Valve: The aortic valve is tricuspid. Aortic valve regurgitation is not visualized. Aortic valve mean gradient measures 8.0 mmHg. Aortic valve peak gradient measures 16.6 mmHg. Aortic valve area, by VTI measures 2.78 cm. Pulmonic Valve: The pulmonic valve was not well visualized. Pulmonic valve regurgitation is not visualized. Aorta: The aortic root is normal in size and structure. Venous: The inferior  vena cava is normal in size with greater than 50% respiratory variability, suggesting right atrial pressure of 3 mmHg. IAS/Shunts: No atrial level shunt detected by color flow Doppler.  LEFT VENTRICLE PLAX 2D LVIDd:         4.00 cm   Diastology LVIDs:         2.80 cm   LV e' medial:    6.42 cm/s LV PW:         1.60 cm   LV E/e' medial:  10.8 LV IVS:        1.50 cm   LV e' lateral:   9.14 cm/s LVOT diam:     2.00 cm   LV E/e' lateral: 7.6 LV SV:         95 LV SV Index:   46 LVOT Area:     3.14 cm  RIGHT VENTRICLE RV Basal diam:  3.30 cm RV Mid diam:    3.30 cm RV S prime:     23.00 cm/s TAPSE (M-mode): 2.3 cm LEFT ATRIUM             Index        RIGHT ATRIUM           Index LA diam:        3.70 cm 1.80 cm/m   RA Area:     14.50 cm LA Vol (A2C):   62.0 ml 30.18 ml/m  RA Volume:   36.30 ml  17.67  ml/m LA Vol (A4C):   41.7 ml 20.30 ml/m LA Biplane Vol: 52.7 ml 25.66 ml/m  AORTIC VALVE                     PULMONIC VALVE AV Area (Vmax):    2.57 cm      PV Vmax:       1.50 m/s AV Area (Vmean):   2.44 cm      PV Peak grad:  9.0 mmHg AV Area (VTI):     2.78 cm AV Vmax:           204.00 cm/s AV Vmean:          130.000 cm/s AV VTI:            0.341 m AV Peak Grad:      16.6 mmHg AV Mean Grad:      8.0 mmHg LVOT Vmax:         167.00 cm/s LVOT Vmean:        101.000 cm/s LVOT VTI:          0.302 m LVOT/AV VTI ratio: 0.89  AORTA Ao Root diam: 3.40 cm MITRAL VALVE                TRICUSPID VALVE MV Area (PHT): 4.93 cm     TR Peak grad:   9.1 mmHg MV Area VTI:   3.82 cm     TR Vmax:        151.00 cm/s MV Peak grad:  9.0 mmHg MV Mean grad:  3.0 mmHg     SHUNTS MV Vmax:       1.50 m/s     Systemic VTI:  0.30 m MV Vmean:      82.5 cm/s    Systemic Diam: 2.00 cm MV Decel Time: 154 msec MV E velocity: 69.20 cm/s MV A velocity: 148.00 cm/s MV E/A ratio:  0.47 Debbe Odea MD Electronically signed by Debbe Odea MD Signature Date/Time: 01/16/2023/4:23:22 PM  Final    PERIPHERAL VASCULAR CATHETERIZATION Result Date: 01/15/2023 See surgical note for result.        Sunnie Nielsen, DO [facility] 01/17/2023, 4:24 PM    Dictation software may have been used to generate the above note. Typos may occur and escape review in typed/dictated notes. Please contact Dr Lyn Hollingshead directly for clarity if needed.  Staff may message me via secure chat in Epic  but this may not receive an immediate response,  please page me for urgent matters!  If 7PM-7AM, please contact night coverage [email or web address]

## 2023-01-17 NOTE — Anesthesia Procedure Notes (Signed)
Procedure Name: Intubation Date/Time: 01/17/2023 10:38 AM  Performed by: Karoline Caldwell, CRNAPre-anesthesia Checklist: Patient identified, Patient being monitored, Timeout performed, Emergency Drugs available and Suction available Patient Re-evaluated:Patient Re-evaluated prior to induction Oxygen Delivery Method: Circle system utilized Preoxygenation: Pre-oxygenation with 100% oxygen Induction Type: IV induction Ventilation: Mask ventilation without difficulty Laryngoscope Size: Mac and 3 Grade View: Grade I Tube type: Oral Tube size: 7.0 mm Number of attempts: 1 Airway Equipment and Method: Stylet Placement Confirmation: ETT inserted through vocal cords under direct vision, positive ETCO2 and breath sounds checked- equal and bilateral Secured at: 21 cm Tube secured with: Tape Dental Injury: Teeth and Oropharynx as per pre-operative assessment

## 2023-01-17 NOTE — Transfer of Care (Signed)
Immediate Anesthesia Transfer of Care Note  Patient: Jennifer [name]  Procedure(s) Performed: AMPUTATION BELOW KNEE (Right: Knee)  Patient Location: PACU  Anesthesia Type:General  Level of Consciousness: awake, alert , and oriented  Airway & Oxygen Therapy: Patient Spontanous Breathing  Post-op Assessment: Report given to RN and Post -op Vital signs reviewed and stable  Post vital signs: Reviewed and stable  Last Vitals:  Vitals Value Taken Time  BP 147/88 01/17/23 1216  Temp    Pulse 104 01/17/23 1219  Resp 16 01/17/23 1219  SpO2 100 % 01/17/23 1219  Vitals shown include unfiled device data.  Last Pain:  Vitals:   01/17/23 1000  TempSrc: Temporal  PainSc:          Complications: No notable events documented.

## 2023-01-17 NOTE — Progress Notes (Signed)
   01/17/23 0800  Spiritual Encounters  Type of Visit Follow up  Care provided to: Family;Patient  Conversation partners present during encounter Nurse  Referral source Patient request  Reason for visit Surgical  OnCall Visit Yes  Spiritual Framework  Presenting Themes Rituals and practive   Chaplain received a call to speak with patient before surgery. Chaplain met with patient and her aunt before surgery and prayed with her and offered her encouraging words. Patient was very anxious and nervous about upcoming surgery. Chaplain services will continue to be available for the patient and the family for emotional and spiritual support.

## 2023-01-17 NOTE — Plan of Care (Signed)
  Problem: Metabolic: Goal: Ability to maintain appropriate glucose levels will improve Outcome: Not Progressing   Pt's blood glucose this am 425 while NPO for surgery

## 2023-01-17 NOTE — Anesthesia Procedure Notes (Signed)
Anesthesia Regional Block: Adductor canal block   Pre-Anesthetic Checklist: , timeout performed,  Correct Patient, Correct Site, Correct Laterality,  Correct Procedure, Correct Position, site marked,  Risks and benefits discussed,  Surgical consent,  Pre-op evaluation,  At surgeon's request and post-op pain management  Laterality: Lower and Right  Prep: chloraprep       Needles:  Injection technique: Single-shot  Needle Type: Echogenic Needle     Needle Length: 9cm  Needle Gauge: 21     Additional Needles:   Procedures:,,,, ultrasound used (permanent image in chart),,    Narrative:  Start time: 01/17/2023 12:23 PM End time: 01/17/2023 12:25 PM Injection made incrementally with aspirations every 5 mL.  Performed by: Personally  Anesthesiologist: Louie Boston, MD  Additional Notes: Patient's chart reviewed and they were deemed appropriate candidate for procedure, per surgeon's request. Patient educated about risks, benefits, and alternatives of the block including but not limited to: temporary or permanent nerve damage, bleeding, infection, damage to surround tissues, block failure, local anesthetic toxicity. Patient expressed understanding. A formal time-out was conducted consistent with institution rules.  Monitors were applied, and minimal sedation used (see nursing record). The site was prepped with skin prep and allowed to dry, and sterile gloves were used. A high frequency linear ultrasound probe with probe cover was utilized throughout. Femoral artery visualized at mid-thigh level, local anesthetic injected anterolateral to it, and echogenic block needle trajectory was monitored throughout. Hydrodissection of saphenous nerve visualized and appeared anatomically normal. Aspiration performed every 5ml. Blood vessels were avoided. All injections were performed without resistance and free of blood and paresthesias. The patient tolerated the procedure well.  Injectate: 20cc  Exparel

## 2023-01-17 NOTE — Anesthesia Preprocedure Evaluation (Addendum)
Anesthesia Evaluation  Patient identified by MRN, date of birth, ID band Patient awake    Reviewed: Allergy & Precautions, NPO status , Patient's Chart, lab work & pertinent test results  History of Anesthesia Complications Negative for: history of anesthetic complications  Airway Mallampati: II  TM Distance: >3 FB Neck ROM: full    Dental  (+) Poor Dentition, Missing   Pulmonary sleep apnea , Current Smoker   Pulmonary exam normal        Cardiovascular hypertension, On Medications + Peripheral Vascular Disease  + dysrhythmias Atrial Fibrillation   01/15/23: RLE angiogram, stent placement to R popliteal A, mechanical thrombectomy to R anterior tibial A on heparin gtt  Echo 01/16/2023 IMPRESSIONS     1. Left ventricular ejection fraction, by estimation, is 65 to 70%. The  left ventricle has normal function. The left ventricle has no regional  wall motion abnormalities. There is mild left ventricular hypertrophy.  Left ventricular diastolic parameters  are consistent with Grade I diastolic dysfunction (impaired relaxation).   2. Right ventricular systolic function is normal. The right ventricular  size is normal. There is normal pulmonary artery systolic pressure.   3. The mitral valve is normal in structure. No evidence of mitral valve  regurgitation.   4. The aortic valve is tricuspid. Aortic valve regurgitation is not  visualized.   5. The inferior vena cava is normal in size with greater than 50%  respiratory variability, suggesting right atrial pressure of 3 mmHg.     Neuro/Psych  Headaches  Neuromuscular disease  negative psych ROS   GI/Hepatic negative GI ROS, Neg liver ROS,,,  Endo/Other  diabetes, Well Controlled, Type 2    Renal/GU Renal disease     Musculoskeletal   Abdominal   Peds  Hematology  (+) Blood dyscrasia, anemia   Anesthesia Other Findings Past Medical History: No date: Diabetes  mellitus without complication (HCC) No date: Hypertension  Past Surgical History: No date: CESAREAN SECTION     Comment:  x2 01/15/2023: LOWER EXTREMITY ANGIOGRAPHY; Right     Comment:  Procedure: Lower Extremity Angiography;  Surgeon: Annice Needy, MD;  Location: [facility] INVASIVE CV LAB;  Service:               Cardiovascular;  Laterality: Right; No date: none  BMI    Body Mass Index: 34.38 kg/m      Reproductive/Obstetrics negative OB ROS                             Anesthesia Physical Anesthesia Plan  ASA: 3  Anesthesia Plan: General ETT   Post-op Pain Management: Ofirmev IV (intra-op)*, Dilaudid IV and Gabapentin PO (pre-op)*   Induction: Intravenous  PONV Risk Score and Plan: 2 and Ondansetron, Dexamethasone, Midazolam and Treatment may vary due to age or medical condition  Airway Management Planned: Oral ETT  Additional Equipment:   Intra-op Plan:   Post-operative Plan: Extubation in OR  Informed Consent: I have reviewed the patients History and Physical, chart, labs and discussed the procedure including the risks, benefits and alternatives for the proposed anesthesia with the patient or authorized representative who has indicated his/her understanding and acceptance.     Dental Advisory Given  Plan Discussed with: Anesthesiologist, CRNA and Surgeon  Anesthesia Plan Comments: (Patient consented for risks of anesthesia including but not limited to:  - adverse reactions  to medications - damage to eyes, teeth, lips or other oral mucosa - nerve damage due to positioning  - sore throat or hoarseness - Damage to heart, brain, nerves, lungs, other parts of body or loss of life  Patient voiced understanding and assent.  Heparin gtt d/c at 727 per [facility], discussed recent heparin bolus + gtt with surgeon, he would like to proceed. Ordered 1U of pRBCs to be given prior to surgery for Hgb 7.6 + recent heparin use. )         Anesthesia Quick Evaluation

## 2023-01-17 NOTE — Consult Note (Signed)
PHARMACY - ANTICOAGULATION CONSULT NOTE  Pharmacy Consult for IV Heparin Indication:  Limb ischemia  Patient Measurements: Height: 5\' 6"  (167.6 cm) Weight: 96.6 kg (213 lb) IBW/kg (Calculated) : 59.3 Heparin Dosing Weight: 80.9 kg  Labs: Recent Labs    01/15/23 0834 01/15/23 0925 01/16/23 0407 01/16/23 1248 01/16/23 2121 01/17/23 0416  HGB 7.5*  --  5.9* 6.9* 7.8* 7.6*  HCT 28.6*  --  22.6* 25.1* 27.1* 26.2*  PLT 532*  --  465*  --   --  342  APTT  --  27  --   --   --   --   LABPROT  --  13.5  --   --   --   --   INR  --  1.0  --   --   --   --   HEPARINUNFRC  --   --   --  <0.10* <0.10* <0.10*  CREATININE 1.33*  --  1.02*  --   --  1.18*   Estimated Creatinine Clearance: 71.3 mL/min (A) (by C-G formula based on SCr of 1.18 mg/dL (H)).  Medical History: Past Medical History:  Diagnosis Date   Diabetes mellitus without complication (HCC)    Hypertension     Medications:  No anticoagulation prior to admission per my chart review  Assessment: 42 y/o F with medical history as above and including PAF on ASA admitted with acute right lower limb ischemia. Patient underwent right lower extremity angiogram with stenting of right popliteal artery and thrombectomy or right anterior tibial artery 12/12. Patient placed on Aggrastat post-operatively. Pharmacy now consulted to initiate heparin infusion for limb ischemia.  Baseline aPTT 27s, INR 1. CBC today notable for drop in Hgb from 7.5 to 5.9. Patient has been ordered blood transfusion.  12/13 2121 HL < 0.1 12/14 0416 HL <0.1, subtherapeutic  Goal of Therapy:  Heparin level 0.3-0.7 units/ml Monitor platelets by anticoagulation protocol: Yes   Plan:  Heparin level is subtherapeutic. Will give heparin bolus of 2500 units x 1 and increase heparin infusion to 1500 units/hr. Recheck heparin level in 6 hours. CBC daily while on heparin.  Otelia Sergeant, PharmD, [facility] 01/17/2023 5:24 AM

## 2023-01-17 NOTE — Anesthesia Postprocedure Evaluation (Signed)
Anesthesia Post Note  Patient: Teacher, early years/pre  Procedure(s) Performed: AMPUTATION BELOW KNEE (Right: Knee)  Patient location during evaluation: PACU Anesthesia Type: General and Regional Level of consciousness: awake and alert Pain management: pain level controlled Vital Signs Assessment: post-procedure vital signs reviewed and stable Respiratory status: spontaneous breathing, nonlabored ventilation, respiratory function stable and patient connected to nasal cannula oxygen Cardiovascular status: blood pressure returned to baseline and stable Postop Assessment: no apparent nausea or vomiting Anesthetic complications: no   No notable events documented.   Last Vitals:  Vitals:   01/17/23 1300 01/17/23 1304  BP: (!) 173/107 (!) 155/87  Pulse: (!) 106 (!) 107  Resp: 19 16  Temp:    SpO2: 93% 96%    Last Pain:  Vitals:   01/17/23 1300  TempSrc:   PainSc: 8                  Louie Boston

## 2023-01-17 NOTE — Progress Notes (Signed)
   01/17/23 1400  Spiritual Encounters  Type of Visit Initial  Care provided to: Patient  Referral source Chaplain team  Reason for visit Surgical  OnCall Visit Yes   Chaplain provided post-op support.

## 2023-01-17 NOTE — Op Note (Signed)
SURGICAL OPERATIVE REPORT   DATE OF PROCEDURE: 01/17/2023  ATTENDING Surgeon(s): Iline Oven, MD  ASSISTANT(S): None  ANESTHESIA: GETA   PRE-OPERATIVE DIAGNOSIS: Right lower extremity non-revascularizable ischemia / severe pain  POST-OPERATIVE DIAGNOSIS: Right lower extremity non-revascularizable ischemia / severe pain  PROCEDURE(S): Right lower extremity below knee amputation  INTRAOPERATIVE FINDINGS: pulsatile flow into proximal calf.  Viable muscle.   INTRAOPERATIVE FLUIDS: 1000 mL crystalloid   ESTIMATED BLOOD LOSS: 100 mL   URINE OUTPUT: N/A   SPECIMENS: Below knee Right/Left lower extremity   IMPLANTS: None   DRAINS: None   COMPLICATIONS: None apparent   CONDITION AT COMPLETION: stable   DISPOSITION: PACU, extubated   INDICATIONS FOR PROCEDURE:  Patient is a 42 y.o. female who presented with right calf non-revascularizable ischemia with severe and unremitting pain but only early ischemic skin changes without frank necrosis, for which right BKA was advised as reconstruction attempts were unsuccessful due to obliteration of the distal (foot and lower calf vasculature. All risks, benefits, and alternatives to above elective procedures were discussed with the patient and family who elected to proceed, and informed consent was accordingly obtained at that time. She was marked and I personally obtained her consent the day before surgery.  DETAILS OF PROCEDURE:  Patient was brought to the operating suite and appropriately identified. General  anesthesia was administered (she refused any epidural or spinal offers)  with intravenous antibiotics, and endotracheal intubation was performed by anesthesiologist. In supine position, the operative site was prepped and draped in the usual sterile fashion.   Skin incision was performed 8 cm below the tibial tuberosity at level of the mid-calf using a #10 blade scalpel to initiate creation of a posterior myocutaneofascial flap, and  incision was extended deep through subcutaneous tissue, fascia and muscle using electrocautery. The soft tissue was removed from the tibia and fibula using a lap pad and the tibia and fibula were divided using an oscillating saw. Vashe irrigation was performed.  The wound was inspected for hemostasis and was clean and dry at the end of the case. The fascia was reapproximated using interrupted 2-0 Vicryl suture. Subcutaneous tissue was reapproximated using interrupted 3-0 Monocryl suture. All instrument and sponge counts were confirmed, and skin was reapproximated using staples. The skin was cleaned and dried, and a sterile dressing was applied. Patient was then safely able to be extubated and transferred to recovery.   I was present for all aspects of the above procedure, and no operative complications were apparent.  Chrisandra Carota, MD

## 2023-01-17 NOTE — Progress Notes (Signed)
I saw and examined the patient in the pre-op area.  I had marked her right leg yesterday.  She has no changes and is ready to proceed.  She understands the plana and risks.  Her hyperglycemia and anemia have been addressed and post-op pain regimens are planned.  She has no additional questions and is ready to proceed.  Chrisandra Carota MD

## 2023-01-17 NOTE — Plan of Care (Signed)
Pt complaining of severe pain 10/10 in right leg post surgery. PRN morphine given with minimal relief reported. Unable to give another dose until 1530, messaged Alexander, DO & order for oral percocet was placed. P.O. Roabxin, Gabapentin, and Percocet given to relieve pain until another IV morphine dose can be given.

## 2023-01-17 NOTE — Plan of Care (Signed)

## 2023-01-18 ENCOUNTER — Encounter: Payer: Self-pay | Admitting: Vascular Surgery

## 2023-01-18 DIAGNOSIS — I998 Other disorder of circulatory system: Secondary | ICD-10-CM | POA: Diagnosis not present

## 2023-01-18 LAB — BPAM RBC
Blood Product Expiration Date: 202501032359
Blood Product Expiration Date: 202412172359
Blood Product Expiration Date: 202501032359
ISSUE DATE / TIME: 202412140924
ISSUE DATE / TIME: 202412130921
ISSUE DATE / TIME: 202412131706
Unit Type and Rh: 7300
Unit Type and Rh: 7300
Unit Type and Rh: 7300

## 2023-01-18 LAB — TYPE AND SCREEN
ABO/RH(D): B POS
Antibody Screen: NEGATIVE
Unit division: 0
Unit division: 0
Unit division: 0

## 2023-01-18 LAB — BASIC METABOLIC PANEL
Anion gap: 11 (ref 5–15)
BUN: 10 mg/dL (ref 6–20)
CO2: 19 mmol/L — ABNORMAL LOW (ref 22–32)
Calcium: 8.4 mg/dL — ABNORMAL LOW (ref 8.9–10.3)
Chloride: 100 mmol/L (ref 98–111)
Creatinine, Ser: 1.27 mg/dL — ABNORMAL HIGH (ref 0.44–1.00)
GFR, Estimated: 53 mL/min — ABNORMAL LOW (ref >60)
Glucose, Bld: 374 mg/dL — ABNORMAL HIGH (ref 70–99)
Potassium: 4.7 mmol/L (ref 3.5–5.1)
Sodium: 130 mmol/L — ABNORMAL LOW (ref 135–145)

## 2023-01-18 LAB — GLUCOSE, CAPILLARY
Glucose-Capillary: 432 mg/dL — ABNORMAL HIGH (ref 70–99)
Glucose-Capillary: 372 mg/dL — ABNORMAL HIGH (ref 70–99)
Glucose-Capillary: 371 mg/dL — ABNORMAL HIGH (ref 70–99)
Glucose-Capillary: 369 mg/dL — ABNORMAL HIGH (ref 70–99)

## 2023-01-18 LAB — CBC
HCT: 28 % — ABNORMAL LOW (ref 36.0–46.0)
Hemoglobin: 8.2 g/dL — ABNORMAL LOW (ref 12.0–15.0)
MCH: 20.6 pg — ABNORMAL LOW (ref 26.0–34.0)
MCHC: 29.3 g/dL — ABNORMAL LOW (ref 30.0–36.0)
MCV: 70.2 fL — ABNORMAL LOW (ref 80.0–100.0)
Platelets: 295 10*3/uL (ref 150–400)
RBC: 3.99 MIL/uL (ref 3.87–5.11)
RDW: 24.5 % — ABNORMAL HIGH (ref 11.5–15.5)
WBC: 13.5 10*3/uL — ABNORMAL HIGH (ref 4.0–10.5)
nRBC: 0 % (ref 0.0–0.2)

## 2023-01-18 MED ORDER — MORPHINE SULFATE (PF) 2 MG/ML IV SOLN
2.0000 mg | INTRAVENOUS | Status: DC | PRN
  Administered 2023-01-18 – 2023-01-19 (×7): 2 mg via INTRAVENOUS
  Filled 2023-01-18 (×7): qty 1

## 2023-01-18 MED ORDER — PREGABALIN 75 MG PO CAPS
200.0000 mg | ORAL_CAPSULE | Freq: Two times a day (BID) | ORAL | Status: DC
  Administered 2023-01-18 – 2023-01-20 (×5): 200 mg via ORAL
  Filled 2023-01-18 (×5): qty 1

## 2023-01-18 MED ORDER — INSULIN ASPART 100 UNIT/ML IJ SOLN
7.0000 [IU] | Freq: Three times a day (TID) | INTRAMUSCULAR | Status: DC
  Administered 2023-01-18 – 2023-01-19 (×4): 7 [IU] via SUBCUTANEOUS
  Filled 2023-01-18 (×4): qty 1

## 2023-01-18 MED ORDER — MORPHINE SULFATE (PF) 2 MG/ML IV SOLN
2.0000 mg | Freq: Once | INTRAVENOUS | Status: AC
  Administered 2023-01-18: 2 mg via INTRAVENOUS
  Filled 2023-01-18: qty 1

## 2023-01-18 MED ORDER — TRAZODONE HCL 100 MG PO TABS
100.0000 mg | ORAL_TABLET | Freq: Every day | ORAL | Status: DC
  Administered 2023-01-18 – 2023-01-22 (×5): 100 mg via ORAL
  Filled 2023-01-18 (×5): qty 1

## 2023-01-18 MED ORDER — MORPHINE SULFATE (PF) 2 MG/ML IV SOLN
2.0000 mg | INTRAVENOUS | Status: DC | PRN
  Administered 2023-01-18 (×3): 2 mg via INTRAVENOUS
  Filled 2023-01-18 (×3): qty 1

## 2023-01-18 MED ORDER — HYDROCODONE-ACETAMINOPHEN 10-325 MG PO TABS
1.0000 | ORAL_TABLET | ORAL | Status: DC | PRN
  Administered 2023-01-18 – 2023-01-22 (×11): 1 via ORAL
  Filled 2023-01-18 (×12): qty 1

## 2023-01-18 NOTE — TOC CM/SW Note (Deleted)
Transition of Care [facility]) - Inpatient Brief Assessment   Patient Details  Name: Jennifer [name] MRN: [id number] Date of Birth: Jul 28, 1980  [facility] CM/SW Contact:    Liliana Cline, LCSW Phone Number: 01/18/2023, 12:22 PM  Transition of Care Asessment: Insurance and Status: Insurance coverage has been reviewed Patient has primary care physician: Yes     Prior/Current Home Services: No current home services Social Drivers of Health Review: SDOH reviewed no interventions necessary Readmission risk has been reviewed: Yes Transition of care needs: no transition of care needs at this time

## 2023-01-18 NOTE — Evaluation (Signed)
Physical Therapy Evaluation Patient Details Name: Jennifer [name] MRN: [id number] DOB: February 10, 1980 Today's Date: 01/18/2023  History of Present Illness  Jennifer [name] is a 42 y.o. female with medical history significant of PAF on aspirin, HTN, IDDM, chronic diabetic neuropathy, presented with worsening of right foot pain. chronic bilateral feet pain right more than left which she attributed to diabetic neuropathy and her gabapentin was increased recently to address that issue.  2 days ago patient started to have right calf pain, claudication worsening with ambulation and last night patient woke up with severe pain of right foot. 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  Clinical Impression  Today's session consisted of education of therapeutic plan d/t "20/10" pain complaints. The pt was provided Amputee handout and demonstrated exercises on the intake limb 2/2 pain in the residual limb. The pt was educated on mobility requirements needed in order to safely d/c home with family care. She reports understanding. At this time the patient's pain is the greatest limiting factor for progression of mobility. It is expected that this patient will progress to d/c home with family care, however she is at risk to d/c to SNF with immobility. PT will continue to follow in order to address bed mobility, gait, transfers, and w/c mobility needs.         If plan is discharge home, recommend the following: A lot of help with walking and/or transfers;A lot of help with bathing/dressing/bathroom   Can travel by private vehicle        Equipment Recommendations Rolling walker (2 wheels);Wheelchair (measurements PT);Wheelchair cushion (measurements PT);BSC/3in1  Recommendations for Other Services       Functional Status Assessment Patient has had a recent decline in their functional status and demonstrates the ability to make significant improvements in function in a reasonable and predictable amount of time.      Precautions / Restrictions Precautions Precautions: Fall Required Braces or Orthoses: Knee Immobilizer - Right Knee Immobilizer - Right:  (Placed on patient with no orders upon arrival.)      Mobility  Bed Mobility               General bed mobility comments: Mobility deferred 2/2 pain    Transfers                        Ambulation/Gait                  Stairs            Wheelchair Mobility     Tilt Bed    Modified Rankin (Stroke Patients Only)       Balance                                             Pertinent Vitals/Pain Pain Assessment Pain Assessment: 0-10 Pain Score: 10-Worst pain ever Pain Location: Right knee Pain Descriptors / Indicators: Restless, Sharp Pain Intervention(s): Limited activity within patient's tolerance    Home Living Family/patient expects to be discharged to:: Private residence Living Arrangements: Spouse/significant other;Children Available Help at Discharge: Family Type of Home: House Home Access: Stairs to enter   Secretary/administrator of Steps: 5   Home Layout: One level Home Equipment: Crutches      Prior Function Prior Level of Function : Independent/Modified Independent  Extremity/Trunk Assessment   Upper Extremity Assessment Upper Extremity Assessment: Overall WFL for tasks assessed    Lower Extremity Assessment Lower Extremity Assessment: RLE deficits/detail RLE: Unable to fully assess due to pain       Communication   Communication Communication: No apparent difficulties  Cognition Arousal: Alert Behavior During Therapy: WFL for tasks assessed/performed Overall Cognitive Status: Within Functional Limits for tasks assessed                                          General Comments      Exercises Amputee Exercises Quad Sets: AROM, Left Gluteal Sets: Both, AROM Straight Leg Raises: AROM, Left    Assessment/Plan    PT Assessment Patient needs continued PT services  PT Problem List Decreased skin integrity;Pain;Decreased knowledge of precautions;Decreased mobility;Impaired sensation;Decreased balance;Decreased knowledge of use of DME;Decreased strength;Decreased activity tolerance       PT Treatment Interventions Gait training;Patient/family education;Stair training;Wheelchair mobility training;Functional mobility training;Therapeutic activities;Therapeutic exercise;Balance training;Neuromuscular re-education    PT Goals (Current goals can be found in the Care Plan section)  Acute Rehab PT Goals Patient Stated Goal: To return home to care for herself PT Goal Formulation: With patient Time For Goal Achievement: 02/01/23 Potential to Achieve Goals: Fair    Frequency Min 5X/week     Co-evaluation               AM-PAC PT "6 Clicks" Mobility  Outcome Measure Help needed turning from your back to your side while in a flat bed without using bedrails?: A Lot Help needed moving from lying on your back to sitting on the side of a flat bed without using bedrails?: A Lot Help needed moving to and from a bed to a chair (including a wheelchair)?: A Lot Help needed standing up from a chair using your arms (e.g., wheelchair or bedside chair)?: A Lot Help needed to walk in hospital room?: Total Help needed climbing 3-5 steps with a railing? : Total 6 Click Score: 10    End of Session Equipment Utilized During Treatment: Right knee immobilizer Activity Tolerance: Patient limited by pain Patient left: in bed;with call bell/phone within reach   PT Visit Diagnosis: Difficulty in walking, not elsewhere classified (R26.2);Pain Pain - Right/Left: Right Pain - part of body: Knee    Time: 8295-6213 PT Time Calculation (min) (ACUTE ONLY): 20 min   Charges:   PT Evaluation $PT Eval Moderate Complexity: 1 Mod   PT General Charges $$ ACUTE PT VISIT: 1 Visit         9:55 AM,  01/18/23 Xaviera Flaten A. Mordecai Maes PT, DPT Physical Therapist - [facility]   Enna Warwick A Riley Papin 01/18/2023, 9:51 AM

## 2023-01-18 NOTE — Evaluation (Signed)
Occupational Therapy Evaluation Patient Details Name: Jennifer [name] MRN: [id number] DOB: 01-22-1981 Today's Date: 01/18/2023   History of Present Illness Jennifer [name] is a 42 y.o. female with medical history significant of PAF on aspirin, HTN, IDDM, chronic diabetic neuropathy, presented with worsening of right foot pain. chronic bilateral feet pain right more than left which she attributed to diabetic neuropathy and her gabapentin was increased recently to address that issue.  2 days ago patient started to have right calf pain, claudication worsening with ambulation and last night patient woke up with severe pain of right foot. 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   Clinical Impression   Pt seen for OT evaluation this date, POD#1 from above surgery. Pt was independent in all ADLs prior to surgery. Pt endorses living in a 1 level home with ~ 5 STE. 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 Pt is eager to return to PLOF with less pain and improved safety and independence. Pt currently requires CGA assist for squat pivot transfers using a RW and MOD A LB dressing while in seated position due to pain and limited balance. Pt instructed in bed mobility, desensitization strategies for management of residual limb, falls prevention strategies, home/routines modifications, DME/AE for LB bathing and dressing tasks, and DC recs. She return verbalizes understanding of education provided Pt would benefit from skilled OT services including additional instruction in dressing techniques with or without assistive devices for dressing and bathing skills to support recall and carryover prior to discharge and ultimately to maximize safety, independence, and minimize falls risk and caregiver burden. Anticipate pt would benefit from >/= 3 hours of therapy services/day prior to acute hospital DC.         If plan is discharge home, recommend the following: A lot of help with bathing/dressing/bathroom;A lot of help with  walking and/or transfers;Assist for transportation;Assistance with cooking/housework;Help with stairs or ramp for entrance;Assistance with feeding    Functional Status Assessment  Patient has had a recent decline in their functional status and demonstrates the ability to make significant improvements in function in a reasonable and predictable amount of time.  Equipment Recommendations  BSC/3in1;Wheelchair (measurements OT);Wheelchair cushion (measurements OT)    Recommendations for Other Services       Precautions / Restrictions Precautions Precautions: Fall Restrictions Weight Bearing Restrictions Per Provider Order: No Other Position/Activity Restrictions: s/p R BKA      Mobility Bed Mobility Overal bed mobility: Modified Independent             General bed mobility comments: Independently utilizes hospital bed features to facilitate transfers.    Transfers Overall transfer level: Needs assistance Equipment used: Rolling walker (2 wheels) Transfers: Sit to/from Stand, Bed to chair/wheelchair/BSC Sit to Stand: Contact guard assist   Squat pivot transfers: Contact guard assist, Min assist       General transfer comment: CGA to take a few steps toward HOB. x1 instance of posterior LOB requiring MIN A to safely maintain standing balance with RW. Heavy reilance on RW for support. Pt fatigues quickly.      Balance Overall balance assessment: Needs assistance Sitting-balance support: Feet supported (LLE supported) Sitting balance-Leahy Scale: Good Sitting balance - Comments: steady static sitting, reaching inside BOS.   Standing balance support: Reliant on assistive device for balance, During functional activity, Bilateral upper extremity supported Standing balance-Leahy Scale: Fair  ADL either performed or assessed with clinical judgement   ADL Overall ADL's : Needs assistance/impaired                                      Functional mobility during ADLs: Contact guard assist;Cueing for safety;Rolling walker (2 wheels) General ADL Comments: MOD I for bed mobility with pt independently using hospital bed featers to facilitate transfers. Close CGA For safety with squat pivot transfers to/from [facility] using RW. CGA to take a few steps with RW to foot of bed and back. x1 instance of posterior LOB appreciated with pt requiring MIN A to safely maintain standing balance at EOB. Anticipate increased assist for more exertional ADL managemeng including LB bathing and dressing from STS. Pt is very eager to get back to PLOF. Educated on safety, falls prevention strategies, residual limb management techniques, and desensitization strategies to maximize safety and functional independence this date.     Vision Baseline Vision/History: 1 Wears glasses Ability to See in Adequate Light: 0 Adequate Patient Visual Report: No change from baseline       Perception         Praxis         Pertinent Vitals/Pain Pain Assessment Pain Assessment: 0-10 Pain Score: 10-Worst pain ever Pain Location: Right knee Pain Descriptors / Indicators: Restless, Sharp Pain Intervention(s): Limited activity within patient's tolerance, Monitored during session, Repositioned, Utilized relaxation techniques     Extremity/Trunk Assessment Upper Extremity Assessment Upper Extremity Assessment: Overall WFL for tasks assessed   Lower Extremity Assessment Lower Extremity Assessment: RLE deficits/detail;Defer to PT evaluation RLE Deficits / Details: s/p R BKA, post op bandages CDI at start/end of session.       Communication Communication Communication: No apparent difficulties Cueing Techniques: Verbal cues   Cognition Arousal: Alert Behavior During Therapy: WFL for tasks assessed/performed Overall Cognitive Status: Within Functional Limits for tasks assessed                                 General Comments: Pleasant,  motivated, eager to participate despite pain. Mildly impulsive with functional mobility.     General Comments       Exercises Other Exercises Other Exercises: OT facilitated ADL management with education and assist as described above.   Shoulder Instructions      Home Living Family/patient expects to be discharged to:: Private residence Living Arrangements: Spouse/significant other;Children Available Help at Discharge: Family Type of Home: House Home Access: Stairs to enter Secretary/administrator of Steps: 4-5 Entrance Stairs-Rails: Right Home Layout: One level     Bathroom Shower/Tub: Chief Strategy Officer: Standard Bathroom Accessibility: No   Home Equipment: Crutches          Prior Functioning/Environment Prior Level of Function : Independent/Modified Independent             Mobility Comments: Independent without AE PTA, denies falls history. ADLs Comments: Independent, caring for family, eager to get back to PLOF        OT Problem List: Decreased strength;Decreased coordination;Decreased activity tolerance;Decreased safety awareness;Impaired balance (sitting and/or standing);Decreased knowledge of use of DME or AE;Pain      OT Treatment/Interventions: Self-care/ADL training;Therapeutic exercise;Therapeutic activities;Energy conservation;DME and/or AE instruction;Patient/family education;Balance training    OT Goals(Current goals can be found in the care plan section) Acute Rehab OT Goals Patient Stated  Goal: To go home OT Goal Formulation: With patient Time For Goal Achievement: 02/01/23 Potential to Achieve Goals: Good ADL Goals Pt Will Perform Grooming: sitting;Independently Pt Will Perform Lower Body Dressing: sitting/lateral leans;with modified independence;with adaptive equipment Pt Will Transfer to Toilet: regular height toilet;ambulating;with supervision;with set-up Pt Will Perform Toileting - Clothing Manipulation and hygiene:  sitting/lateral leans;sit to/from stand;with modified independence;with adaptive equipment  OT Frequency: Min 1X/week    Co-evaluation              AM-PAC OT "6 Clicks" Daily Activity     Outcome Measure Help from another person eating meals?: None Help from another person taking care of personal grooming?: A Little Help from another person toileting, which includes using toliet, bedpan, or urinal?: A Little Help from another person bathing (including washing, rinsing, drying)?: A Lot Help from another person to put on and taking off regular upper body clothing?: A Little Help from another person to put on and taking off regular lower body clothing?: A Lot 6 Click Score: 17   End of Session Equipment Utilized During Treatment: Gait belt;Rolling walker (2 wheels) Nurse Communication: Mobility status  Activity Tolerance: Patient tolerated treatment well Patient left: in bed;with call bell/phone within reach;with bed alarm set;with family/visitor present  OT Visit Diagnosis: Other abnormalities of gait and mobility (R26.89);Muscle weakness (generalized) (M62.81);Pain Pain - Right/Left: Right Pain - part of body: Knee;Leg                Time: 6962-9528 OT Time Calculation (min): 28 min Charges:  OT General Charges $OT Visit: 1 Visit OT Evaluation $OT Eval Moderate Complexity: 1 Mod OT Treatments $Self Care/Home Management : 8-22 mins  Rockney Ghee, M.S., OTR/L 01/18/23, 2:51 PM

## 2023-01-18 NOTE — Progress Notes (Signed)
PROGRESS NOTE    Jennifer [name]   UJW:[id number] DOB: Aug 25, 1980  DOA: 01/15/2023 Date of Service: 01/18/23 which is hospital day 3  [facility]P: [facility]    HPI: Jennifer [name] is a 42 y.o. female with medical history significant of PAF on aspirin, HTN, IDDM, chronic diabetic neuropathy, presented with worsening of right foot pain. chronic bilateral feet pain right more than left which she attributed to diabetic neuropathy and her gabapentin was increased recently to address that issue.  2 days ago patient started to have right calf pain, claudication worsening with ambulation and last night patient woke up with severe pain of right foot.   Hospital course / significant events:  12/12: to ED,  (+)acute right lower extremity ischemia.  Vascular surgery consulted and Dr. Wyn Quaker took pt to OR for angiogram w/ stent placement to R popliteal A, mechanical thrombectomy to R anterior tibial A --> aggrastat drip. Note Hgb 7.5 on admission. Consideration for BKA 12/13: Foot remains cyanotic. Hgb 5.9, 2 unit PRBC. D/c aggrastat, back on heparin. Persistent ischemic pain, pt amenable to amputation. Plan for BKA planned for tomorrow pending correction of anemia.  12/14: additional unit PRBC this morning. R BKA. Pain control remains a challenge  12/15: PT/OT eval pending. Pain control remains a challenge, switched gabapentin to lyrica   Consultants:  Vascular surgery   Procedures/Surgeries: 01/15/23: RLE angiogram, stent placement to R popliteal A, mechanical thrombectomy to R anterior tibial A  01/17/23: Right lower extremity below knee amputation, anesthesia also performed adductor canal nerve block      ASSESSMENT & PLAN:  Acute right lower limb ischemia - suspect due to embolic event, underlying PVD.   POD2 S/p RLE angiography w/ stent to R popliteal A, thrombectomy to R anterior tibial A Persistent pain  Vascular surgery following  Education on risk factor  modification, including smoking cessation, medications, glucose management Continue statin Pain control    Paroxysmal atrial fibrillation Pt has been on ASA alone, no anticoagulation  Continue Cardizem = increased dose given persistent tachycardia and BP above goal  Eliquis started today   Chronic iron deficiency anemia Acute anemia w/ concern for ABLA d/t procedure  Diagnosed last year, not on any iron supplement.   2 unit PRBC 01/16/23, 1 unit PRBC 01/17/23  Consider iron IV dose and continue po supplementation on discharge  Caution on anticoagulation - monitor CBC/HH    HTN Stable continue home BP meds including HCTZ and Cardizem, lisinopril   IDDM with hyperglycemia Continue home dosage of 70/30 SSI   Diabetic neuropathy stopped gabapentin, started lyrica      Class 1 obesity based on BMI: Body mass index is 34.38 kg/m.  Underweight - under 18.5  normal weight - 18.5 to 24.9 overweight - 25 to 29.9 obese - 30 or more   DVT prophylaxis: on aggrastat drip IV fluids: no continuous IV fluids  Nutrition: diabetic/cardiac  Central lines / invasive devices: none  Code Status: FULL CODE ACP documentation reviewed: none on file in [facility]  TOC needs: TBD Barriers to dispo / significant pending items: postop requiring IV pain meds, likely will need HH/SNF             Subjective / Brief ROS:  Patient reports persistent severe pain RLE Denies CP/SOB.  Denies new weakness.  Tolerating diet .  Reports no concerns w/ urination/defecation.   Family Communication: family at bedside on rounds     Objective Findings:  Vitals:  01/17/23 2030 01/18/23 0357 01/18/23 0511 01/18/23 0930  BP: (!) 163/91 (!) 158/90 (!) 150/90   Pulse: 98 94    Resp: 16 16  15   Temp: 98.5 F (36.9 C) 98.2 F (36.8 C)    TempSrc: Oral Oral    SpO2: 95% 100%    Weight:      Height:       No intake or output data in the 24 hours ending 01/18/23 1326  Filed Weights    01/15/23 0822 01/15/23 0838 01/15/23 1525  Weight: 96.6 kg 96.6 kg 96.6 kg    Examination:  Physical Exam Constitutional:      General: She is not in acute distress. Cardiovascular:     Rate and Rhythm: Regular rhythm. Tachycardia present.  Pulmonary:     Effort: Pulmonary effort is normal.     Breath sounds: Normal breath sounds.  Skin:    General: Skin is dry.     Comments: R foot cool  Neurological:     General: No focal deficit present.     Mental Status: She is alert.  Psychiatric:        Mood and Affect: Mood normal.        Behavior: Behavior normal.          Scheduled Medications:   sodium chloride   Intravenous Once   sodium chloride   Intravenous Once   sodium chloride   Intravenous Once   sodium chloride   Intravenous Once   apixaban  5 mg Oral BID   Chlorhexidine Gluconate Cloth  6 each Topical Daily   diltiazem  60 mg Oral Q6H   insulin aspart  0-20 Units Subcutaneous TID WC   insulin aspart  0-5 Units Subcutaneous QHS   insulin aspart  7 Units Subcutaneous TID WC   methocarbamol  1,000 mg Oral TID   nicotine  21 mg Transdermal Daily   pregabalin  200 mg Oral BID   traZODone  100 mg Oral QHS    Continuous Infusions:  ondansetron (ZOFRAN) IV 8 mg (01/17/23 0250)    PRN Medications:  alum & mag hydroxide-simeth, HYDROcodone-acetaminophen, morphine injection, ondansetron (ZOFRAN) IV, mouth rinse  Antimicrobials from admission:  Anti-infectives (From admission, onward)    Start     Dose/Rate Route Frequency Ordered Stop   01/15/23 1459  ceFAZolin (ANCEF) IVPB 2g/100 mL premix        2 g 200 mL/hr over 30 Minutes Intravenous 30 min pre-op 01/15/23 1459 01/15/23 1646           Data Reviewed:  I have personally reviewed the following...  CBC: Recent Labs  Lab 01/15/23 0834 01/16/23 0407 01/16/23 1248 01/16/23 2121 01/17/23 0416 01/17/23 1453 01/18/23 0549  WBC 12.1* 13.2*  --   --  12.8*  --  13.5*  NEUTROABS 8.8*  --   --   --    --   --   --   HGB 7.5* 5.9* 6.9* 7.8* 7.6* 8.2* 8.2*  HCT 28.6* 22.6* 25.1* 27.1* 26.2* 27.5* 28.0*  MCV 63.7* 64.6*  --   --  67.0*  --  70.2*  PLT 532* 465*  --   --  342  --  295   Basic Metabolic Panel: Recent Labs  Lab 01/15/23 0834 01/16/23 0407 01/17/23 0416 01/18/23 0549  NA 127* 130* 130* 130*  K 4.3 4.5 4.6 4.7  CL 97* 103 101 100  CO2 20* 19* 21* 19*  GLUCOSE 602* 379* 428* 374*  BUN 14 10 10 10   CREATININE 1.33* 1.02* 1.18* 1.27*  CALCIUM 9.1 8.1* 8.6* 8.4*   GFR: Estimated Creatinine Clearance: 66.2 mL/min (A) (by C-G formula based on SCr of 1.27 mg/dL (H)). Liver Function Tests: Recent Labs  Lab 01/15/23 0834  AST 16  ALT 12  ALKPHOS 70  BILITOT 0.3  PROT 8.0  ALBUMIN 3.5   No results for input(s): "LIPASE", "AMYLASE" in the last 168 hours. No results for input(s): "AMMONIA" in the last 168 hours. Coagulation Profile: Recent Labs  Lab 01/15/23 0925  INR 1.0   Cardiac Enzymes: No results for input(s): "CKTOTAL", "CKMB", "CKMBINDEX", "TROPONINI" in the last 168 hours. BNP (last 3 results) No results for input(s): "PROBNP" in the last 8760 hours. HbA1C: No results for input(s): "HGBA1C" in the last 72 hours. CBG: Recent Labs  Lab 01/17/23 1332 01/17/23 1638 01/17/23 2031 01/18/23 0743 01/18/23 1130  GLUCAP 267* 290* 339* 432* 372*   Lipid Profile: No results for input(s): "CHOL", "HDL", "LDLCALC", "TRIG", "CHOLHDL", "LDLDIRECT" in the last 72 hours. Thyroid Function Tests: No results for input(s): "TSH", "T4TOTAL", "FREET4", "T3FREE", "THYROIDAB" in the last 72 hours. Anemia Panel: Recent Labs    01/15/23 1847  FERRITIN 5*  TIBC 424  IRON 13*   Most Recent Urinalysis On File:     Component Value Date/Time   COLORURINE COLORLESS (A) 09/14/2022 1754   APPEARANCEUR CLEAR (A) 09/14/2022 1754   APPEARANCEUR Hazy 05/28/2013 2350   LABSPEC 1.021 09/14/2022 1754   LABSPEC 1.015 05/28/2013 2350   PHURINE 5.0 09/14/2022 1754   GLUCOSEU  >=500 (A) 09/14/2022 1754   GLUCOSEU >=500 05/28/2013 2350   HGBUR NEGATIVE 09/14/2022 1754   BILIRUBINUR NEGATIVE 09/14/2022 1754   BILIRUBINUR Negative 05/28/2013 2350   KETONESUR NEGATIVE 09/14/2022 1754   PROTEINUR NEGATIVE 09/14/2022 1754   NITRITE NEGATIVE 09/14/2022 1754   LEUKOCYTESUR NEGATIVE 09/14/2022 1754   LEUKOCYTESUR Negative 05/28/2013 2350   Sepsis Labs: @LABRCNTIP (procalcitonin:4,lacticidven:4) Microbiology: Recent Results (from the past 240 hours)  MRSA Next Gen by PCR, Nasal     Status: None   Collection Time: 01/15/23  6:43 PM   Specimen: Nasal Mucosa; Nasal Swab  Result Value Ref Range Status   MRSA by PCR Next Gen NOT DETECTED NOT DETECTED Final    Comment: (NOTE) The GeneXpert MRSA Assay (FDA approved for NASAL specimens only), is one component of a comprehensive MRSA colonization surveillance program. It is not intended to diagnose MRSA infection nor to guide or monitor treatment for MRSA infections. Test performance is not FDA approved in patients less than 18 years old. Performed at [facility], [address] [place], [place] [address]       Radiology Studies last 3 days: [place] OR NERVE BLOCK-IMAGE ONLY [facility]) Result Date: 01/17/2023 There is no interpretation for this exam.  This order is for images obtained during a surgical procedure.  Please See "Surgeries" Tab for more information regarding the procedure.   ECHOCARDIOGRAM COMPLETE Result Date: 01/16/2023    ECHOCARDIOGRAM REPORT   Patient Name:   Jennifer [name] Date of Exam: 01/16/2023 Medical Rec #:  [id number]       Height:       66.0 in Accession #:    [id number]      Weight:       213.0 lb Date of Birth:  06-14-1980       BSA:          2.054 m Patient Age:    42 years  BP:           142/80 mmHg Patient Gender: F               HR:           110 bpm. Exam Location:  [facility] Procedure: 2D Echo, Cardiac Doppler and Color Doppler Indications:     Atrial Fibrillation  History:          Patient has prior history of Echocardiogram examinations, most                  recent 05/24/2022. Arrythmias:Atrial Fibrillation; Risk                  Factors:Hypertension, Sleep Apnea, Diabetes, Dyslipidemia and                  Current Smoker.  Sonographer:     Mikki Harbor Referring Phys:  [id number] Marlow Baars DEW Diagnosing Phys: Debbe Odea MD  Sonographer Comments: Image acquisition challenging due to respiratory motion. IMPRESSIONS  1. Left ventricular ejection fraction, by estimation, is 65 to 70%. The left ventricle has normal function. The left ventricle has no regional wall motion abnormalities. There is mild left ventricular hypertrophy. Left ventricular diastolic parameters are consistent with Grade I diastolic dysfunction (impaired relaxation).  2. Right ventricular systolic function is normal. The right ventricular size is normal. There is normal pulmonary artery systolic pressure.  3. The mitral valve is normal in structure. No evidence of mitral valve regurgitation.  4. The aortic valve is tricuspid. Aortic valve regurgitation is not visualized.  5. The inferior vena cava is normal in size with greater than 50% respiratory variability, suggesting right atrial pressure of 3 mmHg. FINDINGS  Left Ventricle: Left ventricular ejection fraction, by estimation, is 65 to 70%. The left ventricle has normal function. The left ventricle has no regional wall motion abnormalities. The left ventricular internal cavity size was normal in size. There is  mild left ventricular hypertrophy. Left ventricular diastolic parameters are consistent with Grade I diastolic dysfunction (impaired relaxation). Right Ventricle: The right ventricular size is normal. No increase in right ventricular wall thickness. Right ventricular systolic function is normal. There is normal pulmonary artery systolic pressure. The tricuspid regurgitant velocity is 1.51 m/s, and  with an assumed right atrial pressure of 3 mmHg, the estimated  right ventricular systolic pressure is 12.1 mmHg. Left Atrium: Left atrial size was normal in size. Right Atrium: Right atrial size was normal in size. Pericardium: There is no evidence of pericardial effusion. Mitral Valve: The mitral valve is normal in structure. No evidence of mitral valve regurgitation. MV peak gradient, 9.0 mmHg. The mean mitral valve gradient is 3.0 mmHg. Tricuspid Valve: The tricuspid valve is normal in structure. Tricuspid valve regurgitation is not demonstrated. Aortic Valve: The aortic valve is tricuspid. Aortic valve regurgitation is not visualized. Aortic valve mean gradient measures 8.0 mmHg. Aortic valve peak gradient measures 16.6 mmHg. Aortic valve area, by VTI measures 2.78 cm. Pulmonic Valve: The pulmonic valve was not well visualized. Pulmonic valve regurgitation is not visualized. Aorta: The aortic root is normal in size and structure. Venous: The inferior vena cava is normal in size with greater than 50% respiratory variability, suggesting right atrial pressure of 3 mmHg. IAS/Shunts: No atrial level shunt detected by color flow Doppler.  LEFT VENTRICLE PLAX 2D LVIDd:         4.00 cm   Diastology LVIDs:         2.80 cm  LV e' medial:    6.42 cm/s LV PW:         1.60 cm   LV E/e' medial:  10.8 LV IVS:        1.50 cm   LV e' lateral:   9.14 cm/s LVOT diam:     2.00 cm   LV E/e' lateral: 7.6 LV SV:         95 LV SV Index:   46 LVOT Area:     3.14 cm  RIGHT VENTRICLE RV Basal diam:  3.30 cm RV Mid diam:    3.30 cm RV S prime:     23.00 cm/s TAPSE (M-mode): 2.3 cm LEFT ATRIUM             Index        RIGHT ATRIUM           Index LA diam:        3.70 cm 1.80 cm/m   RA Area:     14.50 cm LA Vol (A2C):   62.0 ml 30.18 ml/m  RA Volume:   36.30 ml  17.67 ml/m LA Vol (A4C):   41.7 ml 20.30 ml/m LA Biplane Vol: 52.7 ml 25.66 ml/m  AORTIC VALVE                     PULMONIC VALVE AV Area (Vmax):    2.57 cm      PV Vmax:       1.50 m/s AV Area (Vmean):   2.44 cm      PV Peak grad:   9.0 mmHg AV Area (VTI):     2.78 cm AV Vmax:           204.00 cm/s AV Vmean:          130.000 cm/s AV VTI:            0.341 m AV Peak Grad:      16.6 mmHg AV Mean Grad:      8.0 mmHg LVOT Vmax:         167.00 cm/s LVOT Vmean:        101.000 cm/s LVOT VTI:          0.302 m LVOT/AV VTI ratio: 0.89  AORTA Ao Root diam: 3.40 cm MITRAL VALVE                TRICUSPID VALVE MV Area (PHT): 4.93 cm     TR Peak grad:   9.1 mmHg MV Area VTI:   3.82 cm     TR Vmax:        151.00 cm/s MV Peak grad:  9.0 mmHg MV Mean grad:  3.0 mmHg     SHUNTS MV Vmax:       1.50 m/s     Systemic VTI:  0.30 m MV Vmean:      82.5 cm/s    Systemic Diam: 2.00 cm MV Decel Time: 154 msec MV E velocity: 69.20 cm/s MV A velocity: 148.00 cm/s MV E/A ratio:  0.47 Debbe Odea MD Electronically signed by Debbe Odea MD Signature Date/Time: 01/16/2023/4:23:22 PM    Final    PERIPHERAL VASCULAR CATHETERIZATION Result Date: 01/15/2023 See surgical note for result.        Sunnie Nielsen, DO [facility] 01/18/2023, 1:26 PM    Dictation software may have been used to generate the above note. Typos may occur and escape review in typed/dictated notes. Please contact Dr Lyn Hollingshead directly for clarity if  needed.  Staff may message me via secure chat in Epic  but this may not receive an immediate response,  please page me for urgent matters!  If 7PM-7AM, please contact night coverage [email or web address]

## 2023-01-18 NOTE — Plan of Care (Signed)

## 2023-01-18 NOTE — Progress Notes (Signed)
[facility]  Daily Progress Note   Subjective  -   She complains of pain but feels better with knee immobilizer off.  Moves her leg and bends her knee easily and feels much better.  No contracture.  Advised to use knee immobilizer at night for protection.    Objective Vitals:   01/17/23 2030 01/18/23 0357 01/18/23 0511 01/18/23 0930  BP: (!) 163/91 (!) 158/90 (!) 150/90   Pulse: 98 94    Resp: 16 16  15   Temp: 98.5 F (36.9 C) 98.2 F (36.8 C)    TempSrc: Oral Oral    SpO2: 95% 100%    Weight:      Height:       No intake or output data in the 24 hours ending 01/18/23 1614  PULM  CTAB CV  RRR VASC  Femoral pulses and dressing is perfect.  Laboratory CBC    Component Value Date/Time   WBC 13.5 (H) 01/18/2023 0549   HGB 8.2 (L) 01/18/2023 0549   HGB 12.0 06/01/2013 1805   HCT 28.0 (L) 01/18/2023 0549   HCT 39.4 06/01/2013 1805   PLT 295 01/18/2023 0549   PLT 325 06/01/2013 1805    BMET    Component Value Date/Time   NA 130 (L) 01/18/2023 0549   NA 135 (L) 06/01/2013 1805   K 4.7 01/18/2023 0549   K 3.6 06/01/2013 1805   CL 100 01/18/2023 0549   CL 103 06/01/2013 1805   CO2 19 (L) 01/18/2023 0549   CO2 25 06/01/2013 1805   GLUCOSE 374 (H) 01/18/2023 0549   GLUCOSE 179 (H) 06/01/2013 1805   BUN 10 01/18/2023 0549   BUN 11 06/01/2013 1805   CREATININE 1.27 (H) 01/18/2023 0549   CREATININE 0.79 06/01/2013 1805   CALCIUM 8.4 (L) 01/18/2023 0549   CALCIUM 9.1 06/01/2013 1805   GFRNONAA 53 (L) 01/18/2023 0549   GFRNONAA >60 06/01/2013 1805   GFRAA >60 10/08/2016 1820   GFRAA >60 06/01/2013 1805    Assessment/Planning: POD #1 s/p R BKA  Pain management and prepare for rehab.   Iline Oven  01/18/2023, 4:14 PM

## 2023-01-19 ENCOUNTER — Other Ambulatory Visit (HOSPITAL_COMMUNITY): Payer: Self-pay

## 2023-01-19 ENCOUNTER — Telehealth (HOSPITAL_COMMUNITY): Payer: Self-pay

## 2023-01-19 ENCOUNTER — Inpatient Hospital Stay: Payer: Medicaid Other

## 2023-01-19 DIAGNOSIS — I998 Other disorder of circulatory system: Secondary | ICD-10-CM | POA: Diagnosis not present

## 2023-01-19 LAB — CBC
HCT: 28.3 % — ABNORMAL LOW (ref 36.0–46.0)
Hemoglobin: 8.1 g/dL — ABNORMAL LOW (ref 12.0–15.0)
MCH: 20.5 pg — ABNORMAL LOW (ref 26.0–34.0)
MCHC: 28.6 g/dL — ABNORMAL LOW (ref 30.0–36.0)
MCV: 71.5 fL — ABNORMAL LOW (ref 80.0–100.0)
Platelets: 296 10*3/uL (ref 150–400)
RBC: 3.96 MIL/uL (ref 3.87–5.11)
RDW: 25 % — ABNORMAL HIGH (ref 11.5–15.5)
WBC: 10.2 10*3/uL (ref 4.0–10.5)
nRBC: 0 % (ref 0.0–0.2)

## 2023-01-19 LAB — BASIC METABOLIC PANEL
Anion gap: 11 (ref 5–15)
BUN: 14 mg/dL (ref 6–20)
CO2: 20 mmol/L — ABNORMAL LOW (ref 22–32)
Calcium: 8.5 mg/dL — ABNORMAL LOW (ref 8.9–10.3)
Chloride: 98 mmol/L (ref 98–111)
Creatinine, Ser: 1.27 mg/dL — ABNORMAL HIGH (ref 0.44–1.00)
GFR, Estimated: 53 mL/min — ABNORMAL LOW (ref >60)
Glucose, Bld: 623 mg/dL (ref 70–99)
Potassium: 5.1 mmol/L (ref 3.5–5.1)
Sodium: 129 mmol/L — ABNORMAL LOW (ref 135–145)

## 2023-01-19 LAB — GLUCOSE, CAPILLARY
Glucose-Capillary: >600 mg/dL (ref 70–99)
Glucose-Capillary: 496 mg/dL — ABNORMAL HIGH (ref 70–99)
Glucose-Capillary: 293 mg/dL — ABNORMAL HIGH (ref 70–99)
Glucose-Capillary: 211 mg/dL — ABNORMAL HIGH (ref 70–99)
Glucose-Capillary: 292 mg/dL — ABNORMAL HIGH (ref 70–99)

## 2023-01-19 MED ORDER — AMLODIPINE BESYLATE 10 MG PO TABS
10.0000 mg | ORAL_TABLET | Freq: Every day | ORAL | Status: DC
  Administered 2023-01-19 – 2023-01-21 (×3): 10 mg via ORAL
  Filled 2023-01-19 (×3): qty 1

## 2023-01-19 MED ORDER — LISINOPRIL 20 MG PO TABS
20.0000 mg | ORAL_TABLET | Freq: Every day | ORAL | Status: DC
  Administered 2023-01-19 – 2023-01-20 (×2): 20 mg via ORAL
  Filled 2023-01-19 (×2): qty 1

## 2023-01-19 MED ORDER — SENNA 8.6 MG PO TABS
1.0000 | ORAL_TABLET | Freq: Every day | ORAL | Status: DC
  Administered 2023-01-19 – 2023-01-23 (×5): 8.6 mg via ORAL
  Filled 2023-01-19 (×5): qty 1

## 2023-01-19 MED ORDER — HYDROMORPHONE HCL 1 MG/ML IJ SOLN
1.0000 mg | INTRAMUSCULAR | Status: DC | PRN
  Administered 2023-01-19 (×7): 1 mg via INTRAVENOUS
  Filled 2023-01-19 (×7): qty 1

## 2023-01-19 MED ORDER — LACTULOSE 10 GM/15ML PO SOLN
30.0000 g | Freq: Every day | ORAL | Status: DC
  Administered 2023-01-19: 30 g via ORAL
  Filled 2023-01-19 (×2): qty 60

## 2023-01-19 MED ORDER — POLYETHYLENE GLYCOL 3350 17 G PO PACK
34.0000 g | PACK | Freq: Every day | ORAL | Status: DC
  Administered 2023-01-19: 34 g via ORAL
  Filled 2023-01-19 (×2): qty 2

## 2023-01-19 MED ORDER — INSULIN ASPART 100 UNIT/ML IJ SOLN
10.0000 [IU] | Freq: Three times a day (TID) | INTRAMUSCULAR | Status: DC
  Administered 2023-01-19 – 2023-01-21 (×6): 10 [IU] via SUBCUTANEOUS
  Filled 2023-01-19 (×6): qty 1

## 2023-01-19 MED ORDER — INSULIN GLARGINE-YFGN 100 UNIT/ML ~~LOC~~ SOLN
20.0000 [IU] | Freq: Two times a day (BID) | SUBCUTANEOUS | Status: DC
  Administered 2023-01-19 (×2): 20 [IU] via SUBCUTANEOUS
  Filled 2023-01-19 (×3): qty 0.2

## 2023-01-19 MED ORDER — HYDROMORPHONE HCL 1 MG/ML IJ SOLN
2.0000 mg | INTRAMUSCULAR | Status: DC | PRN
  Administered 2023-01-19 – 2023-01-21 (×11): 2 mg via INTRAVENOUS
  Filled 2023-01-19 (×11): qty 2

## 2023-01-19 NOTE — Progress Notes (Signed)
Inpatient Rehab Admissions Coordinator:  ? ?Per therapy recommendations,  patient was screened for CIR candidacy by Devaney Segers, MS, CCC-SLP. At this time, Pt. Appears to be a a potential candidate for CIR. I will place   order for rehab consult per protocol for full assessment. Please contact me any with questions. ? ?Trine Fread, MS, CCC-SLP ?Rehab Admissions Coordinator  ?[phone] (celll) ?[phone] (office) ? ?

## 2023-01-19 NOTE — Progress Notes (Addendum)
PROGRESS NOTE    Jennifer [name]   ZOX:[id number] DOB: 06/25/1980  DOA: 01/15/2023 Date of Service: 01/19/23 which is hospital day 4  PCP: [facility]    HPI: Jennifer [name] is a 42 y.o. female with medical history significant of PAF on aspirin, HTN, IDDM, chronic diabetic neuropathy, presented with worsening of right foot pain. chronic bilateral feet pain right more than left which she attributed to diabetic neuropathy and her gabapentin was increased recently to address that issue.  2 days ago patient started to have right calf pain, claudication worsening with ambulation and last night patient woke up with severe pain of right foot.   Hospital course / significant events:  12/12: to ED,  (+)acute right lower extremity ischemia.  Vascular surgery consulted and Dr. Wyn Quaker took pt to OR for angiogram w/ stent placement to R popliteal A, mechanical thrombectomy to R anterior tibial A --> aggrastat drip. Note Hgb 7.5 on admission. Consideration for BKA 12/13: Foot remains cyanotic. Hgb 5.9, 2 unit PRBC. D/c aggrastat, back on heparin. Persistent ischemic pain, pt amenable to amputation. Plan for BKA planned for tomorrow pending correction of anemia.  12/14: additional unit PRBC this morning. R BKA. Pain control remains a challenge  12/15: PT/OT eval pending. Pain control remains a challenge, switched gabapentin to lyrica   Consultants:  Vascular surgery   Procedures/Surgeries: 01/15/23: RLE angiogram, stent placement to R popliteal A, mechanical thrombectomy to R anterior tibial A  01/17/23: Right lower extremity below knee amputation, anesthesia also performed adductor canal nerve block      ASSESSMENT & PLAN:  Acute right lower limb ischemia - suspect due to embolic event, underlying PVD.   POD2 S/p RLE angiography w/ stent to R popliteal A, thrombectomy to R anterior tibial A Persistent pain probably from occluded popliteal stent Vascular surgery following   Education on risk factor modification, including smoking cessation, medications, glucose management Continue statin Pain control, will adjust Check x-ray right knee given right patellar pain   Paroxysmal atrial fibrillation Pt has been on ASA alone, no anticoagulation given history heavy menstrual bleeding Continue Cardizem = increased dose given persistent tachycardia and BP above goal  Eliquis started  Will need to monitor closely for bleeding   Chronic iron deficiency anemia Acute anemia w/ concern for ABLA d/t procedure  Diagnosed last year, not on any iron supplement.  History iron deficiency anemia from bleeding fibroids. Recently started on depo, reports having some mild vaginal spotting that is normal for her 2 unit PRBC 01/16/23, 1 unit PRBC 01/17/23   continue po supplementation on discharge  Caution on anticoagulation - monitor CBC/HH  Will need outpt gyn f/u   HTN Stable continue home BP meds including HCTZ and Cardizem, lisinopril   IDDM with hyperglycemia Glucose markedly elevated today, home meds weren't ordered Start semglee Add mealtime Continue ssi  Constipation Reports no bm last week - start miralax/senna/lactulose  Diabetic neuropathy stopped gabapentin, started lyrica   HTN Mild elevation - stop dilt, start amlodipine - resume home lisinopril but at 20 instead of 40 - home hydrochlorothiazide on hold   Class 1 obesity based on BMI: Body mass index is 34.38 kg/m.  Underweight - under 18.5  normal weight - 18.5 to 24.9 overweight - 25 to 29.9 obese - 30 or more   DVT prophylaxis: apixaban IV fluids: no continuous IV fluids  Nutrition: diabetic/cardiac  Central lines / invasive devices: none  Code Status: FULL CODE ACP documentation reviewed:  none on file in [facility]  Dispo: CIR eval pending, vs snf             Subjective / Brief ROS:  Reports ongoing pain right extremity popliteal fossa area also anteriorally  Family  Communication: none at bedside    Objective Findings:  Vitals:   01/18/23 1742 01/18/23 2149 01/19/23 0604 01/19/23 0859  BP: 133/71 (!) 147/88 129/75 (!) 142/77  Pulse: 88 91 96 93  Resp: 16  20 16   Temp: 98.8 F (37.1 C) 98.8 F (37.1 C) 98 F (36.7 C) 98.4 F (36.9 C)  TempSrc: Oral Oral Oral   SpO2: 96% 98% 93% 94%  Weight:      Height:       No intake or output data in the 24 hours ending 01/19/23 1553  Filed Weights   01/15/23 0822 01/15/23 0838 01/15/23 1525  Weight: 96.6 kg 96.6 kg 96.6 kg    Examination:  NAD RRR CTAB Abdomen soft,non-tender Right BKA is wrapped/dressed LLE warm       Scheduled Medications:   sodium chloride   Intravenous Once   sodium chloride   Intravenous Once   sodium chloride   Intravenous Once   sodium chloride   Intravenous Once   apixaban  5 mg Oral BID   Chlorhexidine Gluconate Cloth  6 each Topical Daily   diltiazem  60 mg Oral Q6H   insulin aspart  0-20 Units Subcutaneous TID WC   insulin aspart  0-5 Units Subcutaneous QHS   insulin aspart  10 Units Subcutaneous TID WC   insulin glargine-yfgn  20 Units Subcutaneous BID   methocarbamol  1,000 mg Oral TID   nicotine  21 mg Transdermal Daily   pregabalin  200 mg Oral BID   traZODone  100 mg Oral QHS    Continuous Infusions:  ondansetron (ZOFRAN) IV 8 mg (01/17/23 0250)    PRN Medications:  alum & mag hydroxide-simeth, HYDROcodone-acetaminophen, HYDROmorphone (DILAUDID) injection, morphine injection, ondansetron (ZOFRAN) IV, mouth rinse  Antimicrobials from admission:  Anti-infectives (From admission, onward)    Start     Dose/Rate Route Frequency Ordered Stop   01/15/23 1459  ceFAZolin (ANCEF) IVPB 2g/100 mL premix        2 g 200 mL/hr over 30 Minutes Intravenous 30 min pre-op 01/15/23 1459 01/15/23 1646           Data Reviewed:  I have personally reviewed the following...  CBC: Recent Labs  Lab 01/15/23 0834 01/16/23 0407 01/16/23 1248  01/16/23 2121 01/17/23 0416 01/17/23 1453 01/18/23 0549 01/19/23 0551  WBC 12.1* 13.2*  --   --  12.8*  --  13.5* 10.2  NEUTROABS 8.8*  --   --   --   --   --   --   --   HGB 7.5* 5.9*   < > 7.8* 7.6* 8.2* 8.2* 8.1*  HCT 28.6* 22.6*   < > 27.1* 26.2* 27.5* 28.0* 28.3*  MCV 63.7* 64.6*  --   --  67.0*  --  70.2* 71.5*  PLT 532* 465*  --   --  342  --  295 296   < > = values in this interval not displayed.   Basic Metabolic Panel: Recent Labs  Lab 01/15/23 0834 01/16/23 0407 01/17/23 0416 01/18/23 0549 01/19/23 0951  NA 127* 130* 130* 130* 129*  K 4.3 4.5 4.6 4.7 5.1  CL 97* 103 101 100 98  CO2 20* 19* 21* 19* 20*  GLUCOSE 602*  379* 428* 374* 623*  BUN 14 10 10 10 14   CREATININE 1.33* 1.02* 1.18* 1.27* 1.27*  CALCIUM 9.1 8.1* 8.6* 8.4* 8.5*   GFR: Estimated Creatinine Clearance: 66.2 mL/min (A) (by C-G formula based on SCr of 1.27 mg/dL (H)). Liver Function Tests: Recent Labs  Lab 01/15/23 0834  AST 16  ALT 12  ALKPHOS 70  BILITOT 0.3  PROT 8.0  ALBUMIN 3.5   No results for input(s): "LIPASE", "AMYLASE" in the last 168 hours. No results for input(s): "AMMONIA" in the last 168 hours. Coagulation Profile: Recent Labs  Lab 01/15/23 0925  INR 1.0   Cardiac Enzymes: No results for input(s): "CKTOTAL", "CKMB", "CKMBINDEX", "TROPONINI" in the last 168 hours. BNP (last 3 results) No results for input(s): "PROBNP" in the last 8760 hours. HbA1C: No results for input(s): "HGBA1C" in the last 72 hours. CBG: Recent Labs  Lab 01/18/23 1656 01/18/23 2148 01/19/23 0858 01/19/23 1147 01/19/23 1441  GLUCAP 371* 369* >600* 496* 293*   Lipid Profile: No results for input(s): "CHOL", "HDL", "LDLCALC", "TRIG", "CHOLHDL", "LDLDIRECT" in the last 72 hours. Thyroid Function Tests: No results for input(s): "TSH", "T4TOTAL", "FREET4", "T3FREE", "THYROIDAB" in the last 72 hours. Anemia Panel: No results for input(s): "VITAMINB12", "FOLATE", "FERRITIN", "TIBC", "IRON",  "RETICCTPCT" in the last 72 hours.  Most Recent Urinalysis On File:     Component Value Date/Time   COLORURINE COLORLESS (A) 09/14/2022 1754   APPEARANCEUR CLEAR (A) 09/14/2022 1754   APPEARANCEUR Hazy 05/28/2013 2350   LABSPEC 1.021 09/14/2022 1754   LABSPEC 1.015 05/28/2013 2350   PHURINE 5.0 09/14/2022 1754   GLUCOSEU >=500 (A) 09/14/2022 1754   GLUCOSEU >=500 05/28/2013 2350   HGBUR NEGATIVE 09/14/2022 1754   BILIRUBINUR NEGATIVE 09/14/2022 1754   BILIRUBINUR Negative 05/28/2013 2350   KETONESUR NEGATIVE 09/14/2022 1754   PROTEINUR NEGATIVE 09/14/2022 1754   NITRITE NEGATIVE 09/14/2022 1754   LEUKOCYTESUR NEGATIVE 09/14/2022 1754   LEUKOCYTESUR Negative 05/28/2013 2350   Sepsis Labs: @LABRCNTIP (procalcitonin:4,lacticidven:4) Microbiology: Recent Results (from the past 240 hours)  MRSA Next Gen by PCR, Nasal     Status: None   Collection Time: 01/15/23  6:43 PM   Specimen: Nasal Mucosa; Nasal Swab  Result Value Ref Range Status   MRSA by PCR Next Gen NOT DETECTED NOT DETECTED Final    Comment: (NOTE) The GeneXpert MRSA Assay (FDA approved for NASAL specimens only), is one component of a comprehensive MRSA colonization surveillance program. It is not intended to diagnose MRSA infection nor to guide or monitor treatment for MRSA infections. Test performance is not FDA approved in patients less than 39 years old. Performed at [facility], [address] [place], [place] [address]       Radiology Studies last 3 days: [place] OR NERVE BLOCK-IMAGE ONLY [facility]) Result Date: 01/17/2023 There is no interpretation for this exam.  This order is for images obtained during a surgical procedure.  Please See "Surgeries" Tab for more information regarding the procedure.   ECHOCARDIOGRAM COMPLETE Result Date: 01/16/2023    ECHOCARDIOGRAM REPORT   Patient Name:   CARSEN EMMENDORFER Date of Exam: 01/16/2023 Medical Rec #:  [id number]       Height:       66.0 in Accession #:     [id number]      Weight:       213.0 lb Date of Birth:  1981-01-27       BSA:          2.054 m Patient  Age:    42 years        BP:           142/80 mmHg Patient Gender: F               HR:           110 bpm. Exam Location:  [facility] Procedure: 2D Echo, Cardiac Doppler and Color Doppler Indications:     Atrial Fibrillation  History:         Patient has prior history of Echocardiogram examinations, most                  recent 05/24/2022. Arrythmias:Atrial Fibrillation; Risk                  Factors:Hypertension, Sleep Apnea, Diabetes, Dyslipidemia and                  Current Smoker.  Sonographer:     Mikki Harbor Referring Phys:  [id number] Marlow Baars DEW Diagnosing Phys: Debbe Odea MD  Sonographer Comments: Image acquisition challenging due to respiratory motion. IMPRESSIONS  1. Left ventricular ejection fraction, by estimation, is 65 to 70%. The left ventricle has normal function. The left ventricle has no regional wall motion abnormalities. There is mild left ventricular hypertrophy. Left ventricular diastolic parameters are consistent with Grade I diastolic dysfunction (impaired relaxation).  2. Right ventricular systolic function is normal. The right ventricular size is normal. There is normal pulmonary artery systolic pressure.  3. The mitral valve is normal in structure. No evidence of mitral valve regurgitation.  4. The aortic valve is tricuspid. Aortic valve regurgitation is not visualized.  5. The inferior vena cava is normal in size with greater than 50% respiratory variability, suggesting right atrial pressure of 3 mmHg. FINDINGS  Left Ventricle: Left ventricular ejection fraction, by estimation, is 65 to 70%. The left ventricle has normal function. The left ventricle has no regional wall motion abnormalities. The left ventricular internal cavity size was normal in size. There is  mild left ventricular hypertrophy. Left ventricular diastolic parameters are consistent with Grade I diastolic dysfunction  (impaired relaxation). Right Ventricle: The right ventricular size is normal. No increase in right ventricular wall thickness. Right ventricular systolic function is normal. There is normal pulmonary artery systolic pressure. The tricuspid regurgitant velocity is 1.51 m/s, and  with an assumed right atrial pressure of 3 mmHg, the estimated right ventricular systolic pressure is 12.1 mmHg. Left Atrium: Left atrial size was normal in size. Right Atrium: Right atrial size was normal in size. Pericardium: There is no evidence of pericardial effusion. Mitral Valve: The mitral valve is normal in structure. No evidence of mitral valve regurgitation. MV peak gradient, 9.0 mmHg. The mean mitral valve gradient is 3.0 mmHg. Tricuspid Valve: The tricuspid valve is normal in structure. Tricuspid valve regurgitation is not demonstrated. Aortic Valve: The aortic valve is tricuspid. Aortic valve regurgitation is not visualized. Aortic valve mean gradient measures 8.0 mmHg. Aortic valve peak gradient measures 16.6 mmHg. Aortic valve area, by VTI measures 2.78 cm. Pulmonic Valve: The pulmonic valve was not well visualized. Pulmonic valve regurgitation is not visualized. Aorta: The aortic root is normal in size and structure. Venous: The inferior vena cava is normal in size with greater than 50% respiratory variability, suggesting right atrial pressure of 3 mmHg. IAS/Shunts: No atrial level shunt detected by color flow Doppler.  LEFT VENTRICLE PLAX 2D LVIDd:         4.00 cm  Diastology LVIDs:         2.80 cm   LV e' medial:    6.42 cm/s LV PW:         1.60 cm   LV E/e' medial:  10.8 LV IVS:        1.50 cm   LV e' lateral:   9.14 cm/s LVOT diam:     2.00 cm   LV E/e' lateral: 7.6 LV SV:         95 LV SV Index:   46 LVOT Area:     3.14 cm  RIGHT VENTRICLE RV Basal diam:  3.30 cm RV Mid diam:    3.30 cm RV S prime:     23.00 cm/s TAPSE (M-mode): 2.3 cm LEFT ATRIUM             Index        RIGHT ATRIUM           Index LA diam:         3.70 cm 1.80 cm/m   RA Area:     14.50 cm LA Vol (A2C):   62.0 ml 30.18 ml/m  RA Volume:   36.30 ml  17.67 ml/m LA Vol (A4C):   41.7 ml 20.30 ml/m LA Biplane Vol: 52.7 ml 25.66 ml/m  AORTIC VALVE                     PULMONIC VALVE AV Area (Vmax):    2.57 cm      PV Vmax:       1.50 m/s AV Area (Vmean):   2.44 cm      PV Peak grad:  9.0 mmHg AV Area (VTI):     2.78 cm AV Vmax:           204.00 cm/s AV Vmean:          130.000 cm/s AV VTI:            0.341 m AV Peak Grad:      16.6 mmHg AV Mean Grad:      8.0 mmHg LVOT Vmax:         167.00 cm/s LVOT Vmean:        101.000 cm/s LVOT VTI:          0.302 m LVOT/AV VTI ratio: 0.89  AORTA Ao Root diam: 3.40 cm MITRAL VALVE                TRICUSPID VALVE MV Area (PHT): 4.93 cm     TR Peak grad:   9.1 mmHg MV Area VTI:   3.82 cm     TR Vmax:        151.00 cm/s MV Peak grad:  9.0 mmHg MV Mean grad:  3.0 mmHg     SHUNTS MV Vmax:       1.50 m/s     Systemic VTI:  0.30 m MV Vmean:      82.5 cm/s    Systemic Diam: 2.00 cm MV Decel Time: 154 msec MV E velocity: 69.20 cm/s MV A velocity: 148.00 cm/s MV E/A ratio:  0.47 Debbe Odea MD Electronically signed by Debbe Odea MD Signature Date/Time: 01/16/2023/4:23:22 PM    Final    PERIPHERAL VASCULAR CATHETERIZATION Result Date: 01/15/2023 See surgical note for result.        Silvano Bilis, MD [facility] 01/19/2023, 3:53 PM     If 7PM-7AM, please contact night coverage [email or web address]

## 2023-01-19 NOTE — Plan of Care (Addendum)
0710 Shift report indicated patient is maxed out of pain meds and STILL states 10 of 10.  3rd RN indicated she does not feel this patient is seeking, just in pain.  Hosp and Vascular informed and will round and assess as possible.  - Vascular rounded, unable to doppler popliteal pulse.  NPO orders placed and considering options. - No further procedures at this time.  Continue elequis and placed back on diet for now.  0900 Verifing beside CBG with STAT draw - Bedside HIGH - not readable, but 5A draw was 384. Drs informed.  - Dr, based on patient trends,  indicated OK to DC STAT draw.  Verbal orders to just give current MAX sliding and continue to assess.   1110 Lab contacted to inform CMP resulted in critical 623 glucose level from 0950 draw.  AM orders have already been adjusted and were also given - Dr. Informed of critical and I will continue to assess.   1200 CBG 496 - Dr informed and gave verbal OK to give MAX sliding 20U + 10U base.  1430 OK ALL - getting concerned and dont like what's go on. Have had to give pain meds Q1 (IV alternating morphine and dilaudid. Plus also given Norco, Robaxin, and lyrica. Pain is always a 10 and is NOT resolving AT ALL. 186/103 Map 125 HR 101 and temp 99.8. Patient was just walking with PT and had a near syncope episode - resulting in these vitals. CBG 293. Patient was out of it with eye closed and not responding to direct questions, upon getting back into bed. Obviously, took a few minutes, but is now back to baseline. ALL Drs informed.  1700  CBG 211 7U + 10U base Novolog given per orders

## 2023-01-19 NOTE — Progress Notes (Signed)
Inpatient Rehab Coordinator Note:  I spoke with patient over the phone to discuss CIR recommendations and goals/expectations of CIR stay.  We reviewed 3 hrs/day of therapy, physician follow up, and average length of stay 2 weeks (dependent upon progress) with goals of modified independence.  Pt not sure she wants to do rehab in [place] but is open to it if needed.  She would like to discuss with her spouse this evening.  If she progresses well once pain was controlled she may be able to discharge directly home.  I will f/u with her tomorrow.    Estill Dooms, PT, DPT Admissions Coordinator [phone] 01/19/23  12:52 PM

## 2023-01-19 NOTE — TOC Progression Note (Signed)
Transition of Care [facility]) - Progression Note    Patient Details  Name: Jennifer [name] MRN: [id number] Date of Birth: 05-09-1980  [facility] CM/SW Contact  Chapman Fitch, RN Phone Number: 01/19/2023, 11:34 AM  Clinical Narrative:      Per rehab admissions coordinator   potential candidate for CIR. She is to place order for rehab consult per protocol for full assessment       Expected Discharge Plan and Services                                               Social Determinants of Health (SDOH) Interventions SDOH Screenings   Food Insecurity: No Food Insecurity (01/17/2023)  Housing: Unknown (01/17/2023)  Transportation Needs: No Transportation Needs (01/17/2023)  Utilities: Not At Risk (01/17/2023)  Tobacco Use: High Risk (01/15/2023)    Readmission Risk Interventions     No data to display

## 2023-01-19 NOTE — Telephone Encounter (Signed)
 Pharmacy Patient Advocate Encounter  Insurance verification completed.    The patient is insured through [facility] MEDICAID.     Ran test claim for Eliquis and the current 30 day co-pay is $4.00.   This test claim was processed through [facility]- copay amounts may vary at other pharmacies due to pharmacy/plan contracts, or as the patient moves through the different stages of their insurance plan.

## 2023-01-19 NOTE — Progress Notes (Addendum)
Physical Therapy Treatment Patient Details Name: Jennifer [name] MRN: [id number] DOB: 12-22-1980 Today's Date: 01/19/2023   History of Present Illness Jennifer [name] is a 42 y.o. female with medical history significant of PAF on aspirin, HTN, IDDM, chronic diabetic neuropathy, presented with worsening of right foot pain. chronic bilateral feet pain right more than left which she attributed to diabetic neuropathy and her gabapentin was increased recently to address that issue.  2 days ago patient started to have right calf pain, claudication worsening with ambulation and last night patient woke up with severe pain of right foot. 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    PT Comments  Pt seen for PT tx with pt agreeable. Pt reports R knee & R phantom foot pain, PT educates pt on desensitization techniques to help reduce phantom sensation/pain. Pt is able to complete bed mobility with mod I, bed<>BSC transfers via stand pivot with supervision. PT educates pt on ambulation with RW & Pt ambulates in room with RW & CGA increasing to min assist as pt reports lightheadedness with mobility. Pt noted to have elevated BP & nursed called to room & made aware. Pt with decreased responsiveness but no LOC. Will continue to follow pt acutely to progress mobility as able. Also spent time prior to gait discussing d/c disposition with pt.   BP checked in LUE: 175/143 mmHg MAP 154 Rechecked: 150/132 mmHg MAP 139 Checked in RUE: 186/103 mmHg MAP 125 HR 101 bpm    If plan is discharge home, recommend the following: A little help with walking and/or transfers;Assistance with cooking/housework;A little help with bathing/dressing/bathroom;Assist for transportation;Help with stairs or ramp for entrance   Can travel by private vehicle        Equipment Recommendations  Rolling walker (2 wheels);Wheelchair (measurements PT);Wheelchair cushion (measurements PT);BSC/3in1    Recommendations for Other Services       Precautions /  Restrictions Precautions Precautions: Fall Required Braces or Orthoses:  (KI in room but per secure chat with Dr. Wyn Quaker on 12/16 he prefers no KI & instead pt to promote knee extension.) Restrictions Weight Bearing Restrictions Per Provider Order: Yes RLE Weight Bearing Per Provider Order: Non weight bearing     Mobility  Bed Mobility Overal bed mobility: Modified Independent             General bed mobility comments: supine<>sit with HOB elevated, bed rails    Transfers Overall transfer level: Needs assistance   Transfers: Bed to chair/wheelchair/BSC Sit to Stand: Contact guard assist, Supervision (education re: hand placement) Stand pivot transfers: Supervision (bed<>BSC on L & back)              Ambulation/Gait Ambulation/Gait assistance: Contact guard assist, Min assist Gait Distance (Feet): 11 Feet Assistive device: Rolling walker (2 wheels) Gait Pattern/deviations: Decreased stride length, Decreased step length - left Gait velocity: decreased     General Gait Details: PT provides education re: gait pattern, use of BUE to assist & not solely "hopping" on LLE. Pt able to ambulate in room with RW & CGA increasing to min assist as pt c/o lightheadedness & eyes closing intermittently.   Stairs             Wheelchair Mobility     Tilt Bed    Modified Rankin (Stroke Patients Only)       Balance Overall balance assessment: Needs assistance Sitting-balance support: Feet supported Sitting balance-Leahy Scale: Good     Standing balance support: During functional activity, Reliant on assistive  device for balance, Bilateral upper extremity supported Standing balance-Leahy Scale: Fair                              Cognition Arousal: Alert Behavior During Therapy: WFL for tasks assessed/performed Overall Cognitive Status: Within Functional Limits for tasks assessed                                 General Comments: Pleasant,  motivated to participate        Exercises      General Comments General comments (skin integrity, edema, etc.): Pt with continent void on [facility], performs peri hygiene without assistance.      Pertinent Vitals/Pain Pain Assessment Pain Assessment: Faces Faces Pain Scale: Hurts whole lot Pain Location: R knee, phantom pain Pain Descriptors / Indicators: Discomfort, Grimacing Pain Intervention(s): Monitored during session, Premedicated before session (premedicated per chart)    Home Living                          Prior Function            PT Goals (current goals can now be found in the care plan section) Acute Rehab PT Goals Patient Stated Goal: To return home to care for herself PT Goal Formulation: With patient Time For Goal Achievement: 02/01/23 Potential to Achieve Goals: Good Progress towards PT goals: Progressing toward goals    Frequency    Min 1X/week (per new delivery of care model)      PT Plan      Co-evaluation              AM-PAC PT "6 Clicks" Mobility   Outcome Measure  Help needed turning from your back to your side while in a flat bed without using bedrails?: None Help needed moving from lying on your back to sitting on the side of a flat bed without using bedrails?: A Little Help needed moving to and from a bed to a chair (including a wheelchair)?: A Little Help needed standing up from a chair using your arms (e.g., wheelchair or bedside chair)?: A Little Help needed to walk in hospital room?: A Little Help needed climbing 3-5 steps with a railing? : Total 6 Click Score: 17    End of Session   Activity Tolerance: Treatment limited secondary to medical complications (Comment) Patient left: in bed;with call bell/phone within reach;with bed alarm set;with nursing/sitter in room Nurse Communication: Mobility status (BP & events of session) PT Visit Diagnosis: Difficulty in walking, not elsewhere classified (R26.2);Pain;Other  abnormalities of gait and mobility (R26.89) Pain - Right/Left: Right Pain - part of body: Knee     Time: 8469-6295 PT Time Calculation (min) (ACUTE ONLY): 28 min  Charges:    $Therapeutic Activity: 23-37 mins PT General Charges $$ ACUTE PT VISIT: 1 Visit                     Aleda Grana, PT, DPT 01/19/23, 2:46 PM    Sandi Mariscal 01/19/2023, 2:44 PM

## 2023-01-19 NOTE — Progress Notes (Signed)
Progress Note    01/19/2023 12:59 PM 2 Days Post-Op  Subjective:  Jennifer [name] is a 42 yo female now POD # 2 from right BKA. Patient complains of 10/10 pain today. I checked her pulses with a doppler and she has strong femoral pulses. Prior angio she had good blood flow to both her common femoral artery and the SFA. 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 Patient is being treated for pain with multiple medications covering nerve pain and muscle spasms as well as narcotics for surgical pain.    Vitals:   01/19/23 0604 01/19/23 0859  BP: 129/75 (!) 142/77  Pulse: 96 93  Resp: 20 16  Temp: 98 F (36.7 C) 98.4 F (36.9 C)  SpO2: 93% 94%   Physical Exam: Cardiac:  RRR, Normal S1, S2. No Murmurs Lungs: Clear on auscultation to without.  Normal labored breathing with no rales rhonchi or wheezing noted. Incisions: Right below the knee amputation.  Dressing is clean dry and intact.  No complications to note. Extremities: Right lower extremity BKA.  Dressing clean dry and intact.  Left lower extremity warm to touch with palpable pulses. Abdomen: Positive bowel sounds throughout, soft, nontender nondistended. Neurologic: Third and oriented x 3, answers all questions and follows commands appropriately.  CBC    Component Value Date/Time   WBC 10.2 01/19/2023 0551   RBC 3.96 01/19/2023 0551   HGB 8.1 (L) 01/19/2023 0551   HGB 12.0 06/01/2013 1805   HCT 28.3 (L) 01/19/2023 0551   HCT 39.4 06/01/2013 1805   PLT 296 01/19/2023 0551   PLT 325 06/01/2013 1805   MCV 71.5 (L) 01/19/2023 0551   MCV 71 (L) 06/01/2013 1805   MCH 20.5 (L) 01/19/2023 0551   MCHC 28.6 (L) 01/19/2023 0551   RDW 25.0 (H) 01/19/2023 0551   RDW 18.1 (H) 06/01/2013 1805   LYMPHSABS 2.1 01/15/2023 0834   MONOABS 0.8 01/15/2023 0834   EOSABS 0.3 01/15/2023 0834   BASOSABS 0.0 01/15/2023 0834    BMET     Component Value Date/Time   NA 129 (L) 01/19/2023 0951   NA 135 (L) 06/01/2013 1805   K 5.1 01/19/2023 0951   K 3.6 06/01/2013 1805   CL 98 01/19/2023 0951   CL 103 06/01/2013 1805   CO2 20 (L) 01/19/2023 0951   CO2 25 06/01/2013 1805   GLUCOSE 623 (HH) 01/19/2023 0951   GLUCOSE 179 (H) 06/01/2013 1805   BUN 14 01/19/2023 0951   BUN 11 06/01/2013 1805   CREATININE 1.27 (H) 01/19/2023 0951   CREATININE 0.79 06/01/2013 1805   CALCIUM 8.5 (L) 01/19/2023 0951   CALCIUM 9.1 06/01/2013 1805   GFRNONAA 53 (L) 01/19/2023 0951   GFRNONAA >60 06/01/2013 1805   GFRAA >60 10/08/2016 1820   GFRAA >60 06/01/2013 1805    INR    Component Value Date/Time   INR 1.0 01/15/2023 0925    No intake or output data in the 24 hours ending 01/19/23 1259   Assessment/Plan:  42 y.o. female is s/p right lower extremity BKA.  2 Days Post-Op   PLAN: Right BKA femoral pulses strong by Doppler.  No intervention needed at this time. Continue pain medications as needed. OOB to chair most of the day. Continue to work with PT/OT.  Ambulate with assistance. Continue muscle relaxers and nerve agents as prescribed.  DVT prophylaxis: Eliquis 5 mg  twice daily   Marcie [facility] 01/19/2023 12:59 PM

## 2023-01-19 NOTE — Inpatient Diabetes Management (Signed)
Inpatient Diabetes Program Recommendations  AACE/ADA: New Consensus Statement on Inpatient Glycemic Control   Target Ranges:  Prepandial:   less than 140 mg/dL      Peak postprandial:   less than 180 mg/dL (1-2 hours)      Critically ill patients:  140 - 180 mg/dL    Latest Reference Range & Units 01/18/23 07:43 01/18/23 11:30 01/18/23 16:56 01/18/23 21:48 01/19/23 08:58  Glucose-Capillary 70 - 99 mg/dL 161 (H) 096 (H) 045 (H) 369 (H) >600 (HH)    Latest Reference Range & Units 01/18/23 05:49  CO2 22 - 32 mmol/L 19 (L)  Glucose 70 - 99 mg/dL 409 (H)  Anion gap 5 - 15  11   Review of Glycemic Control  Diabetes history: DM2 Outpatient Diabetes medications: 70/30 45 units TID with meals, Metformin 1000 mg BID Current orders for Inpatient glycemic control: Novolog 0-20 units TID with meals, Novolog 0-5 units at bedtime, Novolog 7 units TID with meals  Inpatient Diabetes Program Recommendations:    Insulin: Fasting CBG 432 mg/dl on 81/19 and >147 mg/dl this am. Per notes, RN notified MD of CBG this am and given orders to give max dose of Novolog per correction scale.  Please consider ordering Semglee 20 units BID (to start now) and increase meal coverage to Novolog 10 units TID with meals.  Thanks, Orlando Penner, RN, MSN, CDCES Diabetes Coordinator Inpatient Diabetes Program [phone] (Team Pager from 8am to 5pm)

## 2023-01-20 DIAGNOSIS — I998 Other disorder of circulatory system: Secondary | ICD-10-CM | POA: Diagnosis not present

## 2023-01-20 LAB — BASIC METABOLIC PANEL
Anion gap: 9 (ref 5–15)
BUN: 15 mg/dL (ref 6–20)
CO2: 23 mmol/L (ref 22–32)
Calcium: 8.8 mg/dL — ABNORMAL LOW (ref 8.9–10.3)
Chloride: 100 mmol/L (ref 98–111)
Creatinine, Ser: 1.31 mg/dL — ABNORMAL HIGH (ref 0.44–1.00)
GFR, Estimated: 52 mL/min — ABNORMAL LOW (ref >60)
Glucose, Bld: 391 mg/dL — ABNORMAL HIGH (ref 70–99)
Potassium: 4.7 mmol/L (ref 3.5–5.1)
Sodium: 132 mmol/L — ABNORMAL LOW (ref 135–145)

## 2023-01-20 LAB — GLUCOSE, CAPILLARY
Glucose-Capillary: 399 mg/dL — ABNORMAL HIGH (ref 70–99)
Glucose-Capillary: 234 mg/dL — ABNORMAL HIGH (ref 70–99)
Glucose-Capillary: 218 mg/dL — ABNORMAL HIGH (ref 70–99)
Glucose-Capillary: 201 mg/dL — ABNORMAL HIGH (ref 70–99)

## 2023-01-20 MED ORDER — INSULIN GLARGINE-YFGN 100 UNIT/ML ~~LOC~~ SOLN
30.0000 [IU] | Freq: Two times a day (BID) | SUBCUTANEOUS | Status: DC
Start: 2023-01-20 — End: 2023-01-21
  Administered 2023-01-20 (×2): 30 [IU] via SUBCUTANEOUS
  Filled 2023-01-20 (×3): qty 0.3

## 2023-01-20 MED ORDER — GABAPENTIN 300 MG PO CAPS
600.0000 mg | ORAL_CAPSULE | Freq: Three times a day (TID) | ORAL | Status: DC
  Administered 2023-01-20 (×2): 600 mg via ORAL
  Filled 2023-01-20: qty 6
  Filled 2023-01-20: qty 2

## 2023-01-20 MED ORDER — POLYETHYLENE GLYCOL 3350 17 G PO PACK
17.0000 g | PACK | Freq: Every day | ORAL | Status: DC
Start: 2023-01-20 — End: 2023-01-23
  Administered 2023-01-21 – 2023-01-23 (×3): 17 g via ORAL
  Filled 2023-01-20 (×3): qty 1

## 2023-01-20 NOTE — Inpatient Diabetes Management (Signed)
Inpatient Diabetes Program Recommendations  AACE/ADA: New Consensus Statement on Inpatient Glycemic Control   Target Ranges:  Prepandial:   less than 140 mg/dL      Peak postprandial:   less than 180 mg/dL (1-2 hours)      Critically ill patients:  140 - 180 mg/dL    Latest Reference Range & Units 01/19/23 08:58 01/19/23 11:47 01/19/23 14:41 01/19/23 16:43 01/19/23 21:16 01/20/23 07:35  Glucose-Capillary 70 - 99 mg/dL >119 (HH) 147 (H) 829 (H) 211 (H) 292 (H) 399 (H)   Review of Glycemic Control  Diabetes history: DM2 Outpatient Diabetes medications: 70/30 45 units TID with meals, Metformin 1000 mg BID Current orders for Inpatient glycemic control: Semglee 20 units BID, Novolog 0-20 units TID with meals, Novolog 0-5 units at bedtime, Novolog 10 units TID with meals   Inpatient Diabetes Program Recommendations:     Insulin: Fasting CBG 399 mg/dl this morning. Please consider increasing Semglee to 30 units BID.  Thanks, Orlando Penner, RN, MSN, CDCES Diabetes Coordinator Inpatient Diabetes Program [phone] (Team Pager from 8am to 5pm)

## 2023-01-20 NOTE — Progress Notes (Signed)
PROGRESS NOTE    Jennifer [name]   QMV:[id number] DOB: Mar 15, 1980  DOA: 01/15/2023 Date of Service: 01/20/23 which is hospital day 5  PCP: [facility]    HPI: Jennifer [name] is a 42 y.o. female with medical history significant of PAF on aspirin, HTN, IDDM, chronic diabetic neuropathy, presented with worsening of right foot pain. chronic bilateral feet pain right more than left which she attributed to diabetic neuropathy and her gabapentin was increased recently to address that issue.  2 days ago patient started to have right calf pain, claudication worsening with ambulation and last night patient woke up with severe pain of right foot.   Hospital course / significant events:  12/12: to ED,  (+)acute right lower extremity ischemia.  Vascular surgery consulted and Dr. Wyn Quaker took pt to OR for angiogram w/ stent placement to R popliteal A, mechanical thrombectomy to R anterior tibial A --> aggrastat drip. Note Hgb 7.5 on admission. Consideration for BKA 12/13: Foot remains cyanotic. Hgb 5.9, 2 unit PRBC. D/c aggrastat, back on heparin. Persistent ischemic pain, pt amenable to amputation. Plan for BKA planned for tomorrow pending correction of anemia.  12/14: additional unit PRBC this morning. R BKA. Pain control remains a challenge  12/15: PT/OT eval pending. Pain control remains a challenge, switched gabapentin to lyrica   Consultants:  Vascular surgery   Procedures/Surgeries: 01/15/23: RLE angiogram, stent placement to R popliteal A, mechanical thrombectomy to R anterior tibial A  01/17/23: Right lower extremity below knee amputation, anesthesia also performed adductor canal nerve block      ASSESSMENT & PLAN:  Acute right lower limb ischemia - suspect due to embolic event, underlying PVD.   POD2 S/p RLE angiography w/ stent to R popliteal A, thrombectomy to R anterior tibial A Persistent pain probably from occluded popliteal stent Vascular surgery following   Education on risk factor modification, including smoking cessation, medications, glucose management Continue statin X-ray negative Pain control, will adjust, will trial switching pregabalin to gabapentin as can up-titrate further with that Dressing change planned for thursday   Paroxysmal atrial fibrillation Pt has been on ASA alone, no anticoagulation given history heavy menstrual bleeding Continue Cardizem = increased dose given persistent tachycardia and BP above goal  Eliquis started  Will need to monitor closely for bleeding   Chronic iron deficiency anemia Acute anemia w/ concern for ABLA d/t procedure  Diagnosed last year, not on any iron supplement.  History iron deficiency anemia from bleeding fibroids. Recently started on depo, reports having some mild vaginal spotting that is normal for her 2 unit PRBC 01/16/23, 1 unit PRBC 01/17/23   continue po supplementation on discharge  Caution on anticoagulation - monitor CBC/HH  Will need outpt gyn f/u    IDDM with hyperglycemia Glucose markedly elevated today  Up-titrate semglee to 30 bid Continue mealtime 10 Continue ssi  Constipation Resolved - continue bowel regimen  Diabetic neuropathy Gabapentin as above  HTN normotensive - stop dilt, started amlodipine - resumed home lisinopril but at 20 instead of 40 - home hydrochlorothiazide on hold   Class 1 obesity based on BMI: Body mass index is 34.38 kg/m.  Underweight - under 18.5  normal weight - 18.5 to 24.9 overweight - 25 to 29.9 obese - 30 or more   DVT prophylaxis: apixaban IV fluids: no continuous IV fluids  Nutrition: diabetic/cardiac  Central lines / invasive devices: none  Code Status: FULL CODE ACP documentation reviewed: none on file in [facility]  Dispo: CIR eval pending, vs snf       Subjective / Brief ROS:  Reports ongoing pain right stump  Family Communication: none at bedside    Objective Findings:  Vitals:   01/19/23 1646  01/19/23 2011 01/20/23 0437 01/20/23 0725  BP: (!) 157/90 (!) 143/78 114/73 136/82  Pulse: 89 96 93 93  Resp: 16 18 18 14   Temp: 98.2 F (36.8 C) 98.7 F (37.1 C) 99.4 F (37.4 C) 98.4 F (36.9 C)  TempSrc:  Oral Oral Oral  SpO2:  94% 94% 95%  Weight:      Height:       No intake or output data in the 24 hours ending 01/20/23 1241  Filed Weights   01/15/23 0822 01/15/23 0838 01/15/23 1525  Weight: 96.6 kg 96.6 kg 96.6 kg    Examination:  NAD RRR CTAB Abdomen soft,non-tender Right BKA is wrapped/dressed LLE warm       Scheduled Medications:   amLODipine  10 mg Oral Daily   apixaban  5 mg Oral BID   Chlorhexidine Gluconate Cloth  6 each Topical Daily   insulin aspart  0-20 Units Subcutaneous TID WC   insulin aspart  0-5 Units Subcutaneous QHS   insulin aspart  10 Units Subcutaneous TID WC   insulin glargine-yfgn  30 Units Subcutaneous BID   lisinopril  20 mg Oral Daily   methocarbamol  1,000 mg Oral TID   nicotine  21 mg Transdermal Daily   polyethylene glycol  17 g Oral Daily   pregabalin  200 mg Oral BID   senna  1 tablet Oral Daily   traZODone  100 mg Oral QHS    Continuous Infusions:  ondansetron (ZOFRAN) IV 8 mg (01/17/23 0250)    PRN Medications:  alum & mag hydroxide-simeth, HYDROcodone-acetaminophen, HYDROmorphone (DILAUDID) injection, ondansetron (ZOFRAN) IV, mouth rinse  Antimicrobials from admission:  Anti-infectives (From admission, onward)    Start     Dose/Rate Route Frequency Ordered Stop   01/15/23 1459  ceFAZolin (ANCEF) IVPB 2g/100 mL premix        2 g 200 mL/hr over 30 Minutes Intravenous 30 min pre-op 01/15/23 1459 01/15/23 1646           Data Reviewed:  I have personally reviewed the following...  CBC: Recent Labs  Lab 01/15/23 0834 01/16/23 0407 01/16/23 1248 01/16/23 2121 01/17/23 0416 01/17/23 1453 01/18/23 0549 01/19/23 0551  WBC 12.1* 13.2*  --   --  12.8*  --  13.5* 10.2  NEUTROABS 8.8*  --   --   --   --    --   --   --   HGB 7.5* 5.9*   < > 7.8* 7.6* 8.2* 8.2* 8.1*  HCT 28.6* 22.6*   < > 27.1* 26.2* 27.5* 28.0* 28.3*  MCV 63.7* 64.6*  --   --  67.0*  --  70.2* 71.5*  PLT 532* 465*  --   --  342  --  295 296   < > = values in this interval not displayed.   Basic Metabolic Panel: Recent Labs  Lab 01/16/23 0407 01/17/23 0416 01/18/23 0549 01/19/23 0951 01/20/23 0322  NA 130* 130* 130* 129* 132*  K 4.5 4.6 4.7 5.1 4.7  CL 103 101 100 98 100  CO2 19* 21* 19* 20* 23  GLUCOSE 379* 428* 374* 623* 391*  BUN 10 10 10 14 15   CREATININE 1.02* 1.18* 1.27* 1.27* 1.31*  CALCIUM 8.1* 8.6* 8.4* 8.5* 8.8*  GFR: Estimated Creatinine Clearance: 64.2 mL/min (A) (by C-G formula based on SCr of 1.31 mg/dL (H)). Liver Function Tests: Recent Labs  Lab 01/15/23 0834  AST 16  ALT 12  ALKPHOS 70  BILITOT 0.3  PROT 8.0  ALBUMIN 3.5   No results for input(s): "LIPASE", "AMYLASE" in the last 168 hours. No results for input(s): "AMMONIA" in the last 168 hours. Coagulation Profile: Recent Labs  Lab 01/15/23 0925  INR 1.0   Cardiac Enzymes: No results for input(s): "CKTOTAL", "CKMB", "CKMBINDEX", "TROPONINI" in the last 168 hours. BNP (last 3 results) No results for input(s): "PROBNP" in the last 8760 hours. HbA1C: No results for input(s): "HGBA1C" in the last 72 hours. CBG: Recent Labs  Lab 01/19/23 1441 01/19/23 1643 01/19/23 2116 01/20/23 0735 01/20/23 1219  GLUCAP 293* 211* 292* 399* 234*   Lipid Profile: No results for input(s): "CHOL", "HDL", "LDLCALC", "TRIG", "CHOLHDL", "LDLDIRECT" in the last 72 hours. Thyroid Function Tests: No results for input(s): "TSH", "T4TOTAL", "FREET4", "T3FREE", "THYROIDAB" in the last 72 hours. Anemia Panel: No results for input(s): "VITAMINB12", "FOLATE", "FERRITIN", "TIBC", "IRON", "RETICCTPCT" in the last 72 hours.  Most Recent Urinalysis On File:     Component Value Date/Time   COLORURINE COLORLESS (A) 09/14/2022 1754   APPEARANCEUR CLEAR  (A) 09/14/2022 1754   APPEARANCEUR Hazy 05/28/2013 2350   LABSPEC 1.021 09/14/2022 1754   LABSPEC 1.015 05/28/2013 2350   PHURINE 5.0 09/14/2022 1754   GLUCOSEU >=500 (A) 09/14/2022 1754   GLUCOSEU >=500 05/28/2013 2350   HGBUR NEGATIVE 09/14/2022 1754   BILIRUBINUR NEGATIVE 09/14/2022 1754   BILIRUBINUR Negative 05/28/2013 2350   KETONESUR NEGATIVE 09/14/2022 1754   PROTEINUR NEGATIVE 09/14/2022 1754   NITRITE NEGATIVE 09/14/2022 1754   LEUKOCYTESUR NEGATIVE 09/14/2022 1754   LEUKOCYTESUR Negative 05/28/2013 2350   Sepsis Labs: @LABRCNTIP (procalcitonin:4,lacticidven:4) Microbiology: Recent Results (from the past 240 hours)  MRSA Next Gen by PCR, Nasal     Status: None   Collection Time: 01/15/23  6:43 PM   Specimen: Nasal Mucosa; Nasal Swab  Result Value Ref Range Status   MRSA by PCR Next Gen NOT DETECTED NOT DETECTED Final    Comment: (NOTE) The GeneXpert MRSA Assay (FDA approved for NASAL specimens only), is one component of a comprehensive MRSA colonization surveillance program. It is not intended to diagnose MRSA infection nor to guide or monitor treatment for MRSA infections. Test performance is not FDA approved in patients less than 15 years old. Performed at [facility], [address], [place], [place] [address]       Radiology Studies last 3 days: DG Knee 1-2 Views Right Result Date: 01/19/2023 CLINICAL DATA:  Right knee pain EXAM: RIGHT KNEE - 1-2 VIEW COMPARISON:  None Available. FINDINGS: Normal alignment. No acute fracture or dislocation. Osteotomy noted within the visualized proximal fibular diaphysis. No effusion. Vascular stent graft noted within the adductor hiatus and popliteal artery. IMPRESSION: 1. No acute fracture or dislocation. Electronically Signed   By: Helyn Numbers M.D.   On: 01/19/2023 21:03   [place] OR NERVE BLOCK-IMAGE ONLY [facility]) Result Date: 01/17/2023 There is no interpretation for this exam.  This order is for images obtained  during a surgical procedure.  Please See "Surgeries" Tab for more information regarding the procedure.   ECHOCARDIOGRAM COMPLETE Result Date: 01/16/2023    ECHOCARDIOGRAM REPORT   Patient Name:   CHARDONNAY CASANOVA Date of Exam: 01/16/2023 Medical Rec #:  [id number]       Height:  66.0 in Accession #:    [id number]      Weight:       213.0 lb Date of Birth:  [birth year]       BSA:          2.054 m Patient Age:    42 years        BP:           142/80 mmHg Patient Gender: F               HR:           110 bpm. Exam Location:  [facility] Procedure: 2D Echo, Cardiac Doppler and Color Doppler Indications:     Atrial Fibrillation  History:         Patient has prior history of Echocardiogram examinations, most                  recent 05/24/2022. Arrythmias:Atrial Fibrillation; Risk                  Factors:Hypertension, Sleep Apnea, Diabetes, Dyslipidemia and                  Current Smoker.  Sonographer:     Mikki Harbor Referring Phys:  [id number] Marlow Baars DEW Diagnosing Phys: Debbe Odea MD  Sonographer Comments: Image acquisition challenging due to respiratory motion. IMPRESSIONS  1. Left ventricular ejection fraction, by estimation, is 65 to 70%. The left ventricle has normal function. The left ventricle has no regional wall motion abnormalities. There is mild left ventricular hypertrophy. Left ventricular diastolic parameters are consistent with Grade I diastolic dysfunction (impaired relaxation).  2. Right ventricular systolic function is normal. The right ventricular size is normal. There is normal pulmonary artery systolic pressure.  3. The mitral valve is normal in structure. No evidence of mitral valve regurgitation.  4. The aortic valve is tricuspid. Aortic valve regurgitation is not visualized.  5. The inferior vena cava is normal in size with greater than 50% respiratory variability, suggesting right atrial pressure of 3 mmHg. FINDINGS  Left Ventricle: Left ventricular ejection fraction, by estimation, is  65 to 70%. The left ventricle has normal function. The left ventricle has no regional wall motion abnormalities. The left ventricular internal cavity size was normal in size. There is  mild left ventricular hypertrophy. Left ventricular diastolic parameters are consistent with Grade I diastolic dysfunction (impaired relaxation). Right Ventricle: The right ventricular size is normal. No increase in right ventricular wall thickness. Right ventricular systolic function is normal. There is normal pulmonary artery systolic pressure. The tricuspid regurgitant velocity is 1.51 m/s, and  with an assumed right atrial pressure of 3 mmHg, the estimated right ventricular systolic pressure is 12.1 mmHg. Left Atrium: Left atrial size was normal in size. Right Atrium: Right atrial size was normal in size. Pericardium: There is no evidence of pericardial effusion. Mitral Valve: The mitral valve is normal in structure. No evidence of mitral valve regurgitation. MV peak gradient, 9.0 mmHg. The mean mitral valve gradient is 3.0 mmHg. Tricuspid Valve: The tricuspid valve is normal in structure. Tricuspid valve regurgitation is not demonstrated. Aortic Valve: The aortic valve is tricuspid. Aortic valve regurgitation is not visualized. Aortic valve mean gradient measures 8.0 mmHg. Aortic valve peak gradient measures 16.6 mmHg. Aortic valve area, by VTI measures 2.78 cm. Pulmonic Valve: The pulmonic valve was not well visualized. Pulmonic valve regurgitation is not visualized. Aorta: The aortic root is normal in size and structure. Venous: The inferior vena  cava is normal in size with greater than 50% respiratory variability, suggesting right atrial pressure of 3 mmHg. IAS/Shunts: No atrial level shunt detected by color flow Doppler.  LEFT VENTRICLE PLAX 2D LVIDd:         4.00 cm   Diastology LVIDs:         2.80 cm   LV e' medial:    6.42 cm/s LV PW:         1.60 cm   LV E/e' medial:  10.8 LV IVS:        1.50 cm   LV e' lateral:   9.14  cm/s LVOT diam:     2.00 cm   LV E/e' lateral: 7.6 LV SV:         95 LV SV Index:   46 LVOT Area:     3.14 cm  RIGHT VENTRICLE RV Basal diam:  3.30 cm RV Mid diam:    3.30 cm RV S prime:     23.00 cm/s TAPSE (M-mode): 2.3 cm LEFT ATRIUM             Index        RIGHT ATRIUM           Index LA diam:        3.70 cm 1.80 cm/m   RA Area:     14.50 cm LA Vol (A2C):   62.0 ml 30.18 ml/m  RA Volume:   36.30 ml  17.67 ml/m LA Vol (A4C):   41.7 ml 20.30 ml/m LA Biplane Vol: 52.7 ml 25.66 ml/m  AORTIC VALVE                     PULMONIC VALVE AV Area (Vmax):    2.57 cm      PV Vmax:       1.50 m/s AV Area (Vmean):   2.44 cm      PV Peak grad:  9.0 mmHg AV Area (VTI):     2.78 cm AV Vmax:           204.00 cm/s AV Vmean:          130.000 cm/s AV VTI:            0.341 m AV Peak Grad:      16.6 mmHg AV Mean Grad:      8.0 mmHg LVOT Vmax:         167.00 cm/s LVOT Vmean:        101.000 cm/s LVOT VTI:          0.302 m LVOT/AV VTI ratio: 0.89  AORTA Ao Root diam: 3.40 cm MITRAL VALVE                TRICUSPID VALVE MV Area (PHT): 4.93 cm     TR Peak grad:   9.1 mmHg MV Area VTI:   3.82 cm     TR Vmax:        151.00 cm/s MV Peak grad:  9.0 mmHg MV Mean grad:  3.0 mmHg     SHUNTS MV Vmax:       1.50 m/s     Systemic VTI:  0.30 m MV Vmean:      82.5 cm/s    Systemic Diam: 2.00 cm MV Decel Time: 154 msec MV E velocity: 69.20 cm/s MV A velocity: 148.00 cm/s MV E/A ratio:  0.47 Debbe Odea MD Electronically signed by Debbe Odea MD Signature Date/Time: 01/16/2023/4:23:22 PM    Final  Silvano Bilis, MD [facility] 01/20/2023, 12:41 PM     If 7PM-7AM, please contact night coverage [email or web address]

## 2023-01-20 NOTE — Progress Notes (Addendum)
Inpatient Rehab Admissions Coordinator:   Spoke to pt on the phone.  I let her know that my estimated length of stay for her would be 7-10 days with goals of modified independence (RW).  She was not able to discuss with her spouse yesterday 2/2 medication making her lethargic and she would like to discuss with him prior to making a final decision.  I will continue to follow.   1554: Spoke to pt's spouse on the phone and he is interested in CIR program.  He states he will speak to pt this evening after work to determine if they would like to pursue.    Estill Dooms, PT, DPT Admissions Coordinator [phone] 01/20/23  10:16 AM

## 2023-01-20 NOTE — Progress Notes (Signed)
Progress Note    01/20/2023 12:41 PM 3 Days Post-Op  Subjective:  Jennifer [name] is a 42 yo female now POD # 3 from right BKA. Patient complains of 10/10 pain again today. 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 Patient was counseled this morning on the exercises needed in her amputated leg to prevent contracture. She is refusing to do them thus precipitating contracture and increased pain. We discussed in detail if her stump becomes contracted and irreversible she will need an above the knee amputation to help resolve the pain. Patient is being treated for pain with multiple medications covering nerve pain and muscle spasms as well as narcotics for surgical pain.    Vitals:   01/20/23 0437 01/20/23 0725  BP: 114/73 136/82  Pulse: 93 93  Resp: 18 14  Temp: 99.4 F (37.4 C) 98.4 F (36.9 C)  SpO2: 94% 95%   Physical Exam: Cardiac:  RRR, Normal S1, S2. No Murmurs Lungs: Clear on auscultation to without.  Normal labored breathing with no rales rhonchi or wheezing noted. Incisions: Right below the knee amputation.  Dressing is clean dry and intact.  No complications to note. Extremities: Right lower extremity BKA.  Dressing clean dry and intact.  Left lower extremity warm to touch with palpable pulses. Abdomen: Positive bowel sounds throughout, soft, nontender nondistended. Neurologic: Third and oriented x 3, answers all questions and follows commands appropriately.  CBC    Component Value Date/Time   WBC 10.2 01/19/2023 0551   RBC 3.96 01/19/2023 0551   HGB 8.1 (L) 01/19/2023 0551   HGB 12.0 06/01/2013 1805   HCT 28.3 (L) 01/19/2023 0551   HCT 39.4 06/01/2013 1805   PLT 296 01/19/2023 0551   PLT 325 06/01/2013 1805   MCV 71.5 (L) 01/19/2023 0551   MCV 71 (L) 06/01/2013 1805   MCH 20.5 (L) 01/19/2023 0551   MCHC 28.6 (L) 01/19/2023 0551   RDW 25.0 (H) 01/19/2023 0551    RDW 18.1 (H) 06/01/2013 1805   LYMPHSABS 2.1 01/15/2023 0834   MONOABS 0.8 01/15/2023 0834   EOSABS 0.3 01/15/2023 0834   BASOSABS 0.0 01/15/2023 0834    BMET    Component Value Date/Time   NA 132 (L) 01/20/2023 0322   NA 135 (L) 06/01/2013 1805   K 4.7 01/20/2023 0322   K 3.6 06/01/2013 1805   CL 100 01/20/2023 0322   CL 103 06/01/2013 1805   CO2 23 01/20/2023 0322   CO2 25 06/01/2013 1805   GLUCOSE 391 (H) 01/20/2023 0322   GLUCOSE 179 (H) 06/01/2013 1805   BUN 15 01/20/2023 0322   BUN 11 06/01/2013 1805   CREATININE 1.31 (H) 01/20/2023 0322   CREATININE 0.79 06/01/2013 1805   CALCIUM 8.8 (L) 01/20/2023 0322   CALCIUM 9.1 06/01/2013 1805   GFRNONAA 52 (L) 01/20/2023 0322   GFRNONAA >60 06/01/2013 1805   GFRAA >60 10/08/2016 1820   GFRAA >60 06/01/2013 1805    INR    Component Value Date/Time   INR 1.0 01/15/2023 0925    No intake or output data in the 24 hours ending 01/20/23 1241   Assessment/Plan:  42 y.o. female is s/p right lower extremity BKA  3 Days Post-Op   PLAN: First Dressing change by vascular surgery planned for Thursday morning for possible discharge later in the day to rehab.  Patient must do exercises to prevent early  contracture every hour while awake.  Continue pain medications as needed. OOB to chair most of the day. Continue to work with PT/OT.  Continue exercises to prevent contracture. Ambulate with assistance. Continue muscle relaxers and nerve agents as prescribed.  DVT prophylaxis:  Eliquis 5 mg twice daily    Marcie Bal [facility] 01/20/2023 12:41 PM

## 2023-01-20 NOTE — Progress Notes (Signed)
Physical Therapy Treatment Patient Details Name: Jennifer [name] MRN: [id number] DOB: 1980/10/08 Today's Date: 01/20/2023   History of Present Illness Jennifer [name] is a 42 y.o. female with medical history significant of PAF on aspirin, HTN, IDDM, chronic diabetic neuropathy, presented with worsening of right foot pain. chronic bilateral feet pain right more than left which she attributed to diabetic neuropathy and her gabapentin was increased recently to address that issue.  2 days ago patient started to have right calf pain, claudication worsening with ambulation and last night patient woke up with severe pain of right foot. 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    PT Comments  Pt alert but intermittently lethargic, potentially confused due to pain medication. Did close her eyes often during session and easily distractable. Pt also endorsed feeling out of it. But with extended time and occasional repetition able to follow one step commands. Pt also limited by pain. Overall she demonstrated CGA for sit <> stand and squat pivot transfers, minA for donning/doffing underwear in standing. Max verbal and max assistance needed to manage WC, exhibited some difficulty with propulsion, pivoting, and judging distances, would benefit from further education and attempts. Pt/PT also reviewed importance of RLE TKE and avoid ER/abduction in rest, maxA to position properly at end of session. The patient would benefit from further skilled PT intervention to continue to progress towards goals.    If plan is discharge home, recommend the following: A little help with walking and/or transfers;Assistance with cooking/housework;A little help with bathing/dressing/bathroom;Assist for transportation;Help with stairs or ramp for entrance   Can travel by private vehicle        Equipment Recommendations  Rolling walker (2 wheels);Wheelchair (measurements PT);Wheelchair cushion (measurements PT);BSC/3in1    Recommendations for  Other Services       Precautions / Restrictions Precautions Precautions: Fall Restrictions Other Position/Activity Restrictions: s/p R BKA     Mobility  Bed Mobility Overal bed mobility: Needs Assistance Bed Mobility: Sit to Supine       Sit to supine: Min assist   General bed mobility comments: minA for RLE assist, trunk control    Transfers Overall transfer level: Needs assistance Equipment used: Rolling walker (2 wheels) Transfers: Bed to chair/wheelchair/BSC, Sit to/from Stand Sit to Stand: Contact guard assist Stand pivot transfers: Contact guard assist   Squat pivot transfers: Contact guard assist     General transfer comment: pt needed step by step cueing throughout transfers to maximize safety. Pt able to squat pivot with bedrails and CGA throughout    Ambulation/Gait Ambulation/Gait assistance: Min assist, Contact guard assist Gait Distance (Feet): 5 Feet Assistive device: Rolling walker (2 wheels)         General Gait Details: pt able to hop to BSC and to WC in room. constant cues needed for safety/technique   Psychologist, counselling mobility: Yes Wheelchair propulsion: Both upper extremities Wheelchair parts: Needs assistance Distance: 160 Wheelchair Assistance Details (indicate cue type and reason): max verbal cues for technique (pivots, propulsion, retro etc). max assist needed to manage WC parts (brakes, arm rest)   Tilt Bed    Modified Rankin (Stroke Patients Only)       Balance Overall balance assessment: Needs assistance Sitting-balance support: Feet supported Sitting balance-Leahy Scale: Good Sitting balance - Comments: steady static sitting, reaching inside BOS.   Standing balance support: Single extremity supported, During functional activity Standing balance-Leahy Scale: Poor Standing  balance comment: assistance needed to doff/don mesh underwear                             Cognition Arousal: Alert Behavior During Therapy: WFL for tasks assessed/performed Overall Cognitive Status: Within Functional Limits for tasks assessed                                          Exercises      General Comments General comments (skin integrity, edema, etc.): long sitting in bed BP 105/72, MAP 82, HR 105 bpm      Pertinent Vitals/Pain Pain Assessment Pain Assessment: 0-10 Pain Score: 8  Pain Location: R knee, phantom pain Pain Descriptors / Indicators: Discomfort, Grimacing, Moaning Pain Intervention(s): Limited activity within patient's tolerance, Monitored during session, Premedicated before session, Repositioned    Home Living                          Prior Function            PT Goals (current goals can now be found in the care plan section) Progress towards PT goals: Progressing toward goals    Frequency    Min 1X/week      PT Plan      Co-evaluation              AM-PAC PT "6 Clicks" Mobility   Outcome Measure  Help needed turning from your back to your side while in a flat bed without using bedrails?: A Little Help needed moving from lying on your back to sitting on the side of a flat bed without using bedrails?: A Little Help needed moving to and from a bed to a chair (including a wheelchair)?: A Little Help needed standing up from a chair using your arms (e.g., wheelchair or bedside chair)?: A Little Help needed to walk in hospital room?: A Little Help needed climbing 3-5 steps with a railing? : Total 6 Click Score: 16    End of Session   Activity Tolerance: Patient tolerated treatment well;Patient limited by fatigue;Patient limited by lethargy Patient left: in bed;with call bell/phone within reach;with bed alarm set;with family/visitor present   PT Visit Diagnosis: Difficulty in walking, not elsewhere classified (R26.2);Pain;Other abnormalities of gait and mobility (R26.89) Pain -  Right/Left: Right Pain - part of body: Knee     Time: 1610-9604 PT Time Calculation (min) (ACUTE ONLY): 46 min  Charges:    $Gait Training: 8-22 mins $Therapeutic Activity: 23-37 mins PT General Charges $$ ACUTE PT VISIT: 1 Visit                    Olga Coaster PT, DPT 3:20 PM,01/20/23

## 2023-01-20 NOTE — Progress Notes (Signed)
Occupational Therapy Treatment Patient Details Name: Jennifer [name] MRN: [id number] DOB: 16-Nov-1980 Today's Date: 01/20/2023   History of present illness Jennifer [name] is a 42 y.o. female with medical history significant of PAF on aspirin, HTN, IDDM, chronic diabetic neuropathy, presented with worsening of right foot pain. chronic bilateral feet pain right more than left which she attributed to diabetic neuropathy and her gabapentin was increased recently to address that issue.  2 days ago patient started to have right calf pain, claudication worsening with ambulation and last night patient woke up with severe pain of right foot. 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   OT comments  Ms Popa was seen for OT treatment on this date. Upon arrival to room pt seated on BSC, agreeable to tx. Pt requires SUPERVISION seated bathing, assist for back only. MIN A + RW for BSC>bed step pivot t/f. MIN A donning underwear, asssit to pull up over rear in standing, 1 moderate LOB with R hand unsupported requiring assist to correct. Educated on pain mgmt and desensitization techniques - pt reports 20/10 R knee pain, unable to rest in extension at end of session. Pt making good progress toward goals, will continue to follow POC. Discharge recommendation remains appropriate. If pt decides to return home recommend w/c and BSC.      If plan is discharge home, recommend the following:  A lot of help with bathing/dressing/bathroom;A lot of help with walking and/or transfers;Assist for transportation;Assistance with cooking/housework;Help with stairs or ramp for entrance;Assistance with feeding   Equipment Recommendations  BSC/3in1;Wheelchair (measurements OT);Wheelchair cushion (measurements OT)    Recommendations for Other Services      Precautions / Restrictions Precautions Precautions: Fall       Mobility Bed Mobility Overal bed mobility: Modified Independent                  Transfers Overall transfer  level: Needs assistance Equipment used: Rolling walker (2 wheels) Transfers: Bed to chair/wheelchair/BSC, Sit to/from Stand Sit to Stand: Contact guard assist     Step pivot transfers: Min assist           Balance Overall balance assessment: Needs assistance Sitting-balance support: Feet supported Sitting balance-Leahy Scale: Good     Standing balance support: Single extremity supported, During functional activity Standing balance-Leahy Scale: Poor                             ADL either performed or assessed with clinical judgement   ADL Overall ADL's : Needs assistance/impaired                                       General ADL Comments: SUPERVISION seated bathing, assist for back only. MIN A + RW for BSC t/f. MIN A donning underwear, asssit to pull up over rear in standing, 1 moderate LOB with R hand unsupported requiring assist to correct      Cognition Arousal: Alert Behavior During Therapy: WFL for tasks assessed/performed Overall Cognitive Status: Within Functional Limits for tasks assessed                                                General Comments long sitting in bed BP 105/72, MAP 82,  HR 105 bpm    Pertinent Vitals/ Pain       Pain Assessment Pain Assessment: 0-10 Pain Score: 10-Worst pain ever Pain Location: R knee, phantom pain Pain Descriptors / Indicators: Discomfort, Grimacing Pain Intervention(s): Limited activity within patient's tolerance, Repositioned, Patient requesting pain meds-RN notified   Frequency  Min 1X/week        Progress Toward Goals  OT Goals(current goals can now be found in the care plan section)  Progress towards OT goals: Progressing toward goals  Acute Rehab OT Goals Patient Stated Goal: to go home OT Goal Formulation: With patient Time For Goal Achievement: 02/01/23 Potential to Achieve Goals: Good ADL Goals Pt Will Perform Grooming: sitting;Independently Pt Will  Perform Lower Body Dressing: sitting/lateral leans;with modified independence;with adaptive equipment Pt Will Transfer to Toilet: regular height toilet;ambulating;with supervision;with set-up Pt Will Perform Toileting - Clothing Manipulation and hygiene: sitting/lateral leans;sit to/from stand;with modified independence;with adaptive equipment  Plan      Co-evaluation                 AM-PAC OT "6 Clicks" Daily Activity     Outcome Measure   Help from another person eating meals?: None Help from another person taking care of personal grooming?: A Little Help from another person toileting, which includes using toliet, bedpan, or urinal?: A Little Help from another person bathing (including washing, rinsing, drying)?: A Lot Help from another person to put on and taking off regular upper body clothing?: A Little Help from another person to put on and taking off regular lower body clothing?: A Lot 6 Click Score: 17    End of Session Equipment Utilized During Treatment: Rolling walker (2 wheels)  OT Visit Diagnosis: Other abnormalities of gait and mobility (R26.89);Muscle weakness (generalized) (M62.81);Pain Pain - Right/Left: Right Pain - part of body: Knee;Leg   Activity Tolerance Patient tolerated treatment well   Patient Left in bed;with call bell/phone within reach   Nurse Communication Patient requests pain meds        Time: 1130-1200 OT Time Calculation (min): 30 min  Charges: OT General Charges $OT Visit: 1 Visit OT Treatments $Self Care/Home Management : 8-22 mins $Therapeutic Activity: 8-22 mins  Kathie Dike, M.S. OTR/L  01/20/23, 12:36 PM  ascom [phone]

## 2023-01-21 DIAGNOSIS — I998 Other disorder of circulatory system: Secondary | ICD-10-CM | POA: Diagnosis not present

## 2023-01-21 LAB — CBC
HCT: 27.9 % — ABNORMAL LOW (ref 36.0–46.0)
Hemoglobin: 7.8 g/dL — ABNORMAL LOW (ref 12.0–15.0)
MCH: 20.3 pg — ABNORMAL LOW (ref 26.0–34.0)
MCHC: 28 g/dL — ABNORMAL LOW (ref 30.0–36.0)
MCV: 72.5 fL — ABNORMAL LOW (ref 80.0–100.0)
Platelets: 297 10*3/uL (ref 150–400)
RBC: 3.85 MIL/uL — ABNORMAL LOW (ref 3.87–5.11)
RDW: 26 % — ABNORMAL HIGH (ref 11.5–15.5)
WBC: 14.1 10*3/uL — ABNORMAL HIGH (ref 4.0–10.5)
nRBC: 0 % (ref 0.0–0.2)

## 2023-01-21 LAB — GLUCOSE, CAPILLARY
Glucose-Capillary: 318 mg/dL — ABNORMAL HIGH (ref 70–99)
Glucose-Capillary: 259 mg/dL — ABNORMAL HIGH (ref 70–99)
Glucose-Capillary: 159 mg/dL — ABNORMAL HIGH (ref 70–99)
Glucose-Capillary: 149 mg/dL — ABNORMAL HIGH (ref 70–99)
Glucose-Capillary: 64 mg/dL — ABNORMAL LOW (ref 70–99)
Glucose-Capillary: 162 mg/dL — ABNORMAL HIGH (ref 70–99)

## 2023-01-21 LAB — BASIC METABOLIC PANEL
Anion gap: 9 (ref 5–15)
Anion gap: 9 (ref 5–15)
BUN: 28 mg/dL — ABNORMAL HIGH (ref 6–20)
BUN: 30 mg/dL — ABNORMAL HIGH (ref 6–20)
CO2: 20 mmol/L — ABNORMAL LOW (ref 22–32)
CO2: 23 mmol/L (ref 22–32)
Calcium: 8.8 mg/dL — ABNORMAL LOW (ref 8.9–10.3)
Calcium: 8.9 mg/dL (ref 8.9–10.3)
Chloride: 101 mmol/L (ref 98–111)
Chloride: 102 mmol/L (ref 98–111)
Creatinine, Ser: 2.16 mg/dL — ABNORMAL HIGH (ref 0.44–1.00)
Creatinine, Ser: 1.84 mg/dL — ABNORMAL HIGH (ref 0.44–1.00)
GFR, Estimated: 28 mL/min — ABNORMAL LOW (ref >60)
GFR, Estimated: 34 mL/min — ABNORMAL LOW (ref >60)
Glucose, Bld: 313 mg/dL — ABNORMAL HIGH (ref 70–99)
Glucose, Bld: 160 mg/dL — ABNORMAL HIGH (ref 70–99)
Potassium: 6.1 mmol/L — ABNORMAL HIGH (ref 3.5–5.1)
Potassium: 4.5 mmol/L (ref 3.5–5.1)
Sodium: 130 mmol/L — ABNORMAL LOW (ref 135–145)
Sodium: 134 mmol/L — ABNORMAL LOW (ref 135–145)

## 2023-01-21 MED ORDER — DILTIAZEM HCL ER COATED BEADS 180 MG PO CP24
180.0000 mg | ORAL_CAPSULE | Freq: Every day | ORAL | Status: DC
Start: 2023-01-21 — End: 2023-01-23
  Administered 2023-01-21 – 2023-01-22 (×2): 180 mg via ORAL
  Filled 2023-01-21 (×3): qty 1

## 2023-01-21 MED ORDER — INSULIN GLARGINE-YFGN 100 UNIT/ML ~~LOC~~ SOLN
40.0000 [IU] | Freq: Two times a day (BID) | SUBCUTANEOUS | Status: DC
Start: 2023-01-21 — End: 2023-01-22
  Administered 2023-01-21 – 2023-01-22 (×2): 40 [IU] via SUBCUTANEOUS
  Filled 2023-01-21 (×4): qty 0.4

## 2023-01-21 MED ORDER — HYDROMORPHONE HCL 1 MG/ML IJ SOLN
3.0000 mg | INTRAMUSCULAR | Status: DC | PRN
  Administered 2023-01-21 (×2): 3 mg via INTRAVENOUS
  Filled 2023-01-21 (×2): qty 3

## 2023-01-21 MED ORDER — INSULIN ASPART 100 UNIT/ML IJ SOLN
15.0000 [IU] | Freq: Three times a day (TID) | INTRAMUSCULAR | Status: DC
  Administered 2023-01-21 – 2023-01-23 (×7): 15 [IU] via SUBCUTANEOUS
  Filled 2023-01-21 (×6): qty 1

## 2023-01-21 MED ORDER — SODIUM ZIRCONIUM CYCLOSILICATE 10 G PO PACK
10.0000 g | PACK | Freq: Once | ORAL | Status: AC
  Administered 2023-01-21: 10 g via ORAL
  Filled 2023-01-21: qty 1

## 2023-01-21 MED ORDER — INSULIN ASPART 100 UNIT/ML IV SOLN
10.0000 [IU] | Freq: Once | INTRAVENOUS | Status: AC
Start: 2023-01-21 — End: 2023-01-21
  Administered 2023-01-21: 10 [IU] via INTRAVENOUS
  Filled 2023-01-21: qty 0.1

## 2023-01-21 MED ORDER — GABAPENTIN 400 MG PO CAPS
400.0000 mg | ORAL_CAPSULE | Freq: Three times a day (TID) | ORAL | Status: DC
  Administered 2023-01-21 – 2023-01-23 (×7): 400 mg via ORAL
  Filled 2023-01-21 (×7): qty 1

## 2023-01-21 MED ORDER — HYDROMORPHONE HCL 1 MG/ML IJ SOLN
2.0000 mg | INTRAMUSCULAR | Status: DC | PRN
  Administered 2023-01-21 – 2023-01-22 (×4): 2 mg via INTRAVENOUS
  Filled 2023-01-21 (×4): qty 2

## 2023-01-21 MED ORDER — SODIUM CHLORIDE 0.9 % IV BOLUS
1000.0000 mL | Freq: Once | INTRAVENOUS | Status: AC
  Administered 2023-01-21: 1000 mL via INTRAVENOUS

## 2023-01-21 NOTE — Progress Notes (Addendum)
Progress Note    01/21/2023 11:11 AM 4 Days Post-Op  Subjective:   Jennifer [name] is a 42 yo female now POD # 4 from right BKA. Patient complains of 10/10 pain again today. 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 Patient was counseled this morning again on the exercises needed in her amputated leg to prevent contracture. She is refusing to do them thus precipitating contracture and increased pain. We discussed in detail if her stump becomes contracted and irreversible she will need an above the knee amputation to help resolve the pain. Patient is being treated for pain with multiple medications covering nerve pain and muscle spasms as well as narcotics for surgical pain.    Vitals:   01/21/23 0910 01/21/23 1014  BP: (!) 149/73 (!) 120/49  Pulse: (!) 114 (!) 114  Resp: 16   Temp: 99.1 F (37.3 C)   SpO2:  96%   Physical Exam: Cardiac:  RRR, Normal S1, S2. No Murmurs Lungs: Clear on auscultation to without.  Normal labored breathing with no rales rhonchi or wheezing noted. Incisions: Right below the knee amputation.  Dressing is clean dry and intact.  No complications to note. Extremities: Right lower extremity BKA.  Dressing clean dry and intact.  Left lower extremity warm to touch with palpable pulses. Abdomen: Positive bowel sounds throughout, soft, nontender nondistended. Neurologic: Third and oriented x 3, answers all questions and follows commands appropriately.  CBC    Component Value Date/Time   WBC 14.1 (H) 01/21/2023 0533   RBC 3.85 (L) 01/21/2023 0533   HGB 7.8 (L) 01/21/2023 0533   HGB 12.0 06/01/2013 1805   HCT 27.9 (L) 01/21/2023 0533   HCT 39.4 06/01/2013 1805   PLT 297 01/21/2023 0533   PLT 325 06/01/2013 1805   MCV 72.5 (L) 01/21/2023 0533   MCV 71 (L) 06/01/2013 1805   MCH 20.3 (L) 01/21/2023 0533   MCHC 28.0 (L) 01/21/2023 0533   RDW 26.0 (H)  01/21/2023 0533   RDW 18.1 (H) 06/01/2013 1805   LYMPHSABS 2.1 01/15/2023 0834   MONOABS 0.8 01/15/2023 0834   EOSABS 0.3 01/15/2023 0834   BASOSABS 0.0 01/15/2023 0834    BMET    Component Value Date/Time   NA 130 (L) 01/21/2023 0533   NA 135 (L) 06/01/2013 1805   K 6.1 (H) 01/21/2023 0533   K 3.6 06/01/2013 1805   CL 101 01/21/2023 0533   CL 103 06/01/2013 1805   CO2 20 (L) 01/21/2023 0533   CO2 25 06/01/2013 1805   GLUCOSE 313 (H) 01/21/2023 0533   GLUCOSE 179 (H) 06/01/2013 1805   BUN 28 (H) 01/21/2023 0533   BUN 11 06/01/2013 1805   CREATININE 2.16 (H) 01/21/2023 0533   CREATININE 0.79 06/01/2013 1805   CALCIUM 8.8 (L) 01/21/2023 0533   CALCIUM 9.1 06/01/2013 1805   GFRNONAA 28 (L) 01/21/2023 0533   GFRNONAA >60 06/01/2013 1805   GFRAA >60 10/08/2016 1820   GFRAA >60 06/01/2013 1805    INR    Component Value Date/Time   INR 1.0 01/15/2023 0925     Intake/Output Summary (Last 24 hours) at 01/21/2023 1111 Last data filed at 01/21/2023 0700 Gross per 24 hour  Intake 54 ml  Output --  Net 54 ml     Assessment/Plan:  42 y.o. female is s/p right lower extremity BKA  4 Days Post-Op  PLAN: First Dressing change by vascular surgery planned for Thursday morning for possible discharge later in the day to rehab.  Patient must do exercises to prevent early contracture every hour while awake.  Continue pain medications as needed. OOB to chair most of the day. Continue to work with PT/OT.  Continue exercises to prevent contracture. Ambulate with assistance. Continue muscle relaxers and nerve agents as prescribed.  DVT prophylaxis:  Eliquis 5 mg twice daily    Marcie [facility] 01/21/2023 11:11 AM

## 2023-01-21 NOTE — Plan of Care (Signed)
Patient alert and oriented X 4, continuous pain 10/10 with and without movement of leg that is amputated. Patient alternating between norco and dilaudid prn. Problem: Education: Goal: Ability to describe self-care measures that may prevent or decrease complications (Diabetes Survival Skills Education) will improve Outcome: Progressing Goal: Individualized Educational Video(s) Outcome: Progressing   Problem: Coping: Goal: Ability to adjust to condition or change in health will improve Outcome: Progressing   Problem: Fluid Volume: Goal: Ability to maintain a balanced intake and output will improve Outcome: Progressing   Problem: Health Behavior/Discharge Planning: Goal: Ability to identify and utilize available resources and services will improve Outcome: Progressing Goal: Ability to manage health-related needs will improve Outcome: Progressing   Problem: Metabolic: Goal: Ability to maintain appropriate glucose levels will improve Outcome: Progressing   Problem: Nutritional: Goal: Maintenance of adequate nutrition will improve Outcome: Progressing Goal: Progress toward achieving an optimal weight will improve Outcome: Progressing   Problem: Skin Integrity: Goal: Risk for impaired skin integrity will decrease Outcome: Progressing   Problem: Tissue Perfusion: Goal: Adequacy of tissue perfusion will improve Outcome: Progressing   Problem: Education: Goal: Knowledge of General Education information will improve Description: Including pain rating scale, medication(s)/side effects and non-pharmacologic comfort measures Outcome: Progressing   Problem: Health Behavior/Discharge Planning: Goal: Ability to manage health-related needs will improve Outcome: Progressing   Problem: Clinical Measurements: Goal: Ability to maintain clinical measurements within normal limits will improve Outcome: Progressing Goal: Will remain free from infection Outcome: Progressing Goal: Diagnostic  test results will improve Outcome: Progressing Goal: Respiratory complications will improve Outcome: Progressing Goal: Cardiovascular complication will be avoided Outcome: Progressing   Problem: Activity: Goal: Risk for activity intolerance will decrease Outcome: Progressing   Problem: Nutrition: Goal: Adequate nutrition will be maintained Outcome: Progressing   Problem: Coping: Goal: Level of anxiety will decrease Outcome: Progressing   Problem: Elimination: Goal: Will not experience complications related to bowel motility Outcome: Progressing Goal: Will not experience complications related to urinary retention Outcome: Progressing   Problem: Pain Management: Goal: General experience of comfort will improve Outcome: Progressing   Problem: Safety: Goal: Ability to remain free from injury will improve Outcome: Progressing   Problem: Skin Integrity: Goal: Risk for impaired skin integrity will decrease Outcome: Progressing

## 2023-01-21 NOTE — Progress Notes (Signed)
Physical Therapy Treatment Patient Details Name: Jennifer [name] MRN: [id number] DOB: [birth year] Today's Date: 01/21/2023   History of Present Illness Jennifer [name] is a 42 y.o. female with medical history significant of PAF on aspirin, HTN, IDDM, chronic diabetic neuropathy, presented with worsening of right foot pain. chronic bilateral feet pain right more than left which she attributed to diabetic neuropathy and her gabapentin was increased recently to address that issue.  2 days ago patient started to have right calf pain, claudication worsening with ambulation and last night patient woke up with severe pain of right foot. 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    PT Comments  Pt alert, agreeable to PT, described significant pain throughout session but agreeable to continue and motivated to mobilize. Pt needed minA for RLE for bed mobility. Needed max verbal cues and CGA for safety stand pivot with RW to WC, and then stand pivot with bed rails to return to bed. Pt also still needed max verbal cues/assistance for all WC propulsion and parts. PT also spent time with BKE TKE stretches to encourage TKE and pt education on importance. The patient would benefit from further skilled PT intervention to continue to progress towards goals.     If plan is discharge home, recommend the following: A little help with walking and/or transfers;Assistance with cooking/housework;A little help with bathing/dressing/bathroom;Assist for transportation;Help with stairs or ramp for entrance   Can travel by private vehicle        Equipment Recommendations  Rolling walker (2 wheels);Wheelchair (measurements PT);Wheelchair cushion (measurements PT);BSC/3in1    Recommendations for Other Services       Precautions / Restrictions Precautions Precautions: Fall Restrictions Other Position/Activity Restrictions: s/p R BKA     Mobility  Bed Mobility Overal bed mobility: Needs Assistance Bed Mobility: Sit to Supine,  Supine to Sit     Supine to sit: Min assist Sit to supine: Min assist   General bed mobility comments: minA for RLE assist, trunk control    Transfers Overall transfer level: Needs assistance Equipment used: Rolling walker (2 wheels) Transfers: Bed to chair/wheelchair/BSC, Sit to/from Stand Sit to Stand: Min assist Stand pivot transfers: Contact guard assist   Squat pivot transfers: Contact guard assist     General transfer comment: pt needed step by step cueing throughout transfers to maximize safety. Pt able to squat pivot with bedrails and CGA throughout    Ambulation/Gait               General Gait Details: pt able to hop to BSC and to WC in room. constant cues needed for safety/technique   Psychologist, counselling mobility: Yes Wheelchair propulsion: Both upper extremities Wheelchair parts: Needs assistance Distance: 140 Wheelchair Assistance Details (indicate cue type and reason): max verbal cues for technique (pivots, propulsion, retro etc). max assist needed to manage WC parts (brakes, arm rest)   Tilt Bed    Modified Rankin (Stroke Patients Only)       Balance Overall balance assessment: Needs assistance Sitting-balance support: Feet supported Sitting balance-Leahy Scale: Good     Standing balance support: Single extremity supported, During functional activity Standing balance-Leahy Scale: Poor                              Cognition Arousal: Alert Behavior During Therapy: WFL for tasks assessed/performed Overall Cognitive Status: Within Functional  Limits for tasks assessed                                          Exercises Other Exercises Other Exercises: R TKE overpressure from PT for several minutes, pt able to quad set educated to continue this outside therapy, educated on hip IR as well    General Comments        Pertinent Vitals/Pain Pain  Assessment Pain Assessment: 0-10 Pain Score: 8  Pain Location: R knee, phantom pain Pain Descriptors / Indicators: Discomfort, Grimacing, Moaning Pain Intervention(s): Limited activity within patient's tolerance, Repositioned, Monitored during session, Premedicated before session    Home Living                          Prior Function            PT Goals (current goals can now be found in the care plan section) Progress towards PT goals: Progressing toward goals    Frequency    Min 1X/week      PT Plan      Co-evaluation              AM-PAC PT "6 Clicks" Mobility   Outcome Measure  Help needed turning from your back to your side while in a flat bed without using bedrails?: A Little Help needed moving from lying on your back to sitting on the side of a flat bed without using bedrails?: A Little Help needed moving to and from a bed to a chair (including a wheelchair)?: A Little Help needed standing up from a chair using your arms (e.g., wheelchair or bedside chair)?: A Little Help needed to walk in hospital room?: A Little Help needed climbing 3-5 steps with a railing? : Total 6 Click Score: 16    End of Session Equipment Utilized During Treatment: Gait belt Activity Tolerance: Patient tolerated treatment well;Patient limited by fatigue;Patient limited by lethargy Patient left: in bed;with call bell/phone within reach;with bed alarm set   PT Visit Diagnosis: Difficulty in walking, not elsewhere classified (R26.2);Pain;Other abnormalities of gait and mobility (R26.89) Pain - Right/Left: Right Pain - part of body: Knee     Time: 6578-4696 PT Time Calculation (min) (ACUTE ONLY): 44 min  Charges:    $Therapeutic Exercise: 8-22 mins $Therapeutic Activity: 23-37 mins PT General Charges $$ ACUTE PT VISIT: 1 Visit                     Olga Coaster PT, DPT 4:24 PM,01/21/23

## 2023-01-21 NOTE — Inpatient Diabetes Management (Signed)
Inpatient Diabetes Program Recommendations  AACE/ADA: New Consensus Statement on Inpatient Glycemic Control   Target Ranges:  Prepandial:   less than 140 mg/dL      Peak postprandial:   less than 180 mg/dL (1-2 hours)      Critically ill patients:  140 - 180 mg/dL    Latest Reference Range & Units 01/20/23 07:35 01/20/23 12:19 01/20/23 16:47 01/20/23 20:53 01/21/23 07:30  Glucose-Capillary 70 - 99 mg/dL 147 (H) 829 (H) 562 (H) 201 (H) 318 (H)   Review of Glycemic Control  Diabetes history: DM2 Outpatient Diabetes medications: 70/30 45 units TID with meals, Metformin 1000 mg BID Current orders for Inpatient glycemic control: Semglee 30 units BID, Novolog 0-20 units TID with meals, Novolog 0-5 units at bedtime, Novolog 10 units TID with meals   Inpatient Diabetes Program Recommendations:     Insulin: Fasting CBG 318 mg/dl this morning. Please consider increasing Semglee to 40 units BID and meal coverage to Novolog 15 units TID with meals.   Thanks, Orlando Penner, RN, MSN, CDCES Diabetes Coordinator Inpatient Diabetes Program [phone] (Team Pager from 8am to 5pm)

## 2023-01-21 NOTE — Progress Notes (Signed)
Inpatient Rehab Admissions Coordinator:   Spoke to pt's spouse who was unable to discuss CIR with patient last night due to her already being asleep on his arrival.  Discussed with RNCM today.  I need confirmation that pt is agreeable to pursue CIR in order to start insurance auth.  Also note pt still requiring IV pain control (7 doses in the last 24 hours).  She will need to have pain controlled on PO regimen in order to admit to CIR.    Estill Dooms, PT, DPT Admissions Coordinator [phone] 01/21/23  12:09 PM

## 2023-01-21 NOTE — Progress Notes (Signed)
PROGRESS NOTE    Jennifer [name]   QMV:[id number] DOB: Jun 02, 1980  DOA: 01/15/2023 Date of Service: 01/21/23 which is hospital day 6  PCP: [facility]    HPI: Jennifer [name] is a 42 y.o. female with medical history significant of PAF on aspirin, HTN, IDDM, chronic diabetic neuropathy, presented with worsening of right foot pain. chronic bilateral feet pain right more than left which she attributed to diabetic neuropathy and her gabapentin was increased recently to address that issue.  2 days ago patient started to have right calf pain, claudication worsening with ambulation and last night patient woke up with severe pain of right foot.   Hospital course / significant events:  12/12: to ED,  (+)acute right lower extremity ischemia.  Vascular surgery consulted and Dr. Wyn Quaker took pt to OR for angiogram w/ stent placement to R popliteal A, mechanical thrombectomy to R anterior tibial A --> aggrastat drip. Note Hgb 7.5 on admission. Consideration for BKA 12/13: Foot remains cyanotic. Hgb 5.9, 2 unit PRBC. D/c aggrastat, back on heparin. Persistent ischemic pain, pt amenable to amputation. Plan for BKA planned for tomorrow pending correction of anemia.  12/14: additional unit PRBC this morning. R BKA. Pain control remains a challenge  12/15: PT/OT eval pending. Pain control remains a challenge, switched gabapentin to lyrica   Consultants:  Vascular surgery   Procedures/Surgeries: 01/15/23: RLE angiogram, stent placement to R popliteal A, mechanical thrombectomy to R anterior tibial A  01/17/23: Right lower extremity below knee amputation, anesthesia also performed adductor canal nerve block      ASSESSMENT & PLAN:  Acute right lower limb ischemia - suspect due to embolic event, underlying PVD.   POD2 S/p RLE angiography w/ stent to R popliteal A, thrombectomy to R anterior tibial A Persistent pain probably from occluded popliteal stent Vascular surgery following   Education on risk factor modification, including smoking cessation, medications, glucose management Continue statin X-ray negative Pain control, continue robaxin, gabapentin, dilaudid. After tomorrow's dressing change will see about transitioning to orals probably also with fentanyl patch Dressing change planned for thursday   Paroxysmal atrial fibrillation Pt has been on ASA alone, no anticoagulation given history heavy menstrual bleeding Continue Cardizem = increased dose given persistent tachycardia and BP above goal  Eliquis started  Will need to monitor closely for bleeding   Chronic iron deficiency anemia Acute anemia w/ concern for ABLA d/t procedure  Diagnosed last year, not on any iron supplement.  History iron deficiency anemia from bleeding fibroids. Recently started on depo, reports having some mild vaginal spotting that is normal for her 2 unit PRBC 01/16/23, 1 unit PRBC 01/17/23   continue po supplementation on discharge  Caution on anticoagulation - monitor CBC/HH  Will need outpt gyn f/u   IDDM with hyperglycemia Glucose markedly elevated today  Up-titrate semglee to 40 bid Continue mealtime but increase to 15 Continue ssi  Constipation Resolved - continue bowel regimen  Diabetic neuropathy Gabapentin as above  HTN Normotensive - dilt - hold lisinopril 2/2 aki - home hydrochlorothiazidealso on hold   AKI Hyperkalemia Probably prerenal from reduced po, and lisinopril. Improved with fluids bolus and insulin - continue fluids   Class 1 obesity based on BMI: Body mass index is 34.38 kg/m.  Underweight - under 18.5  normal weight - 18.5 to 24.9 overweight - 25 to 29.9 obese - 30 or more   DVT prophylaxis: apixaban IV fluids: no continuous IV fluids  Nutrition: diabetic/cardiac  Central  lines / invasive devices: none  Code Status: FULL CODE ACP documentation reviewed: none on file in [facility]  Dispo: CIR eval pending, vs snf. Told husband today  needs to have conversation with wife about CIR and make a decision and call the rehab coordinator today       Subjective / Brief ROS:  Reports ongoing pain right stump, somnolent currently after pain meds  Family Communication: husband updated telephonically 12/18    Objective Findings:  Vitals:   01/21/23 0729 01/21/23 0910 01/21/23 1014 01/21/23 1448  BP: 104/63 (!) 149/73 (!) 120/49 119/67  Pulse: 89 (!) 114 (!) 114 (!) 109  Resp: 16 16  18   Temp: 99.1 F (37.3 C) 99.1 F (37.3 C)  98.2 F (36.8 C)  TempSrc: Oral Oral    SpO2: 100%  96% 98%  Weight:      Height:        Intake/Output Summary (Last 24 hours) at 01/21/2023 1458 Last data filed at 01/21/2023 0700 Gross per 24 hour  Intake 54 ml  Output --  Net 54 ml    Filed Weights   01/15/23 0822 01/15/23 0838 01/15/23 1525  Weight: 96.6 kg 96.6 kg 96.6 kg    Examination:  NAD, somnolent RRR CTAB Abdomen soft,non-tender Right BKA is wrapped/dressed LLE warm       Scheduled Medications:   amLODipine  10 mg Oral Daily   apixaban  5 mg Oral BID   Chlorhexidine Gluconate Cloth  6 each Topical Daily   gabapentin  400 mg Oral TID   insulin aspart  0-20 Units Subcutaneous TID WC   insulin aspart  0-5 Units Subcutaneous QHS   insulin aspart  15 Units Subcutaneous TID WC   insulin glargine-yfgn  40 Units Subcutaneous BID   methocarbamol  1,000 mg Oral TID   nicotine  21 mg Transdermal Daily   polyethylene glycol  17 g Oral Daily   senna  1 tablet Oral Daily   traZODone  100 mg Oral QHS    Continuous Infusions:  ondansetron (ZOFRAN) IV 216 mL/hr at 01/21/23 0700    PRN Medications:  alum & mag hydroxide-simeth, HYDROcodone-acetaminophen, HYDROmorphone (DILAUDID) injection, ondansetron (ZOFRAN) IV, mouth rinse  Antimicrobials from admission:  Anti-infectives (From admission, onward)    Start     Dose/Rate Route Frequency Ordered Stop   01/15/23 1459  ceFAZolin (ANCEF) IVPB 2g/100 mL premix         2 g 200 mL/hr over 30 Minutes Intravenous 30 min pre-op 01/15/23 1459 01/15/23 1646           Data Reviewed:  I have personally reviewed the following...  CBC: Recent Labs  Lab 01/15/23 0834 01/16/23 0407 01/16/23 1248 01/17/23 0416 01/17/23 1453 01/18/23 0549 01/19/23 0551 01/21/23 0533  WBC 12.1* 13.2*  --  12.8*  --  13.5* 10.2 14.1*  NEUTROABS 8.8*  --   --   --   --   --   --   --   HGB 7.5* 5.9*   < > 7.6* 8.2* 8.2* 8.1* 7.8*  HCT 28.6* 22.6*   < > 26.2* 27.5* 28.0* 28.3* 27.9*  MCV 63.7* 64.6*  --  67.0*  --  70.2* 71.5* 72.5*  PLT 532* 465*  --  342  --  295 296 297   < > = values in this interval not displayed.   Basic Metabolic Panel: Recent Labs  Lab 01/18/23 0549 01/19/23 0951 01/20/23 0322 01/21/23 0533 01/21/23 1152  NA 130*  129* 132* 130* 134*  K 4.7 5.1 4.7 6.1* 4.5  CL 100 98 100 101 102  CO2 19* 20* 23 20* 23  GLUCOSE 374* 623* 391* 313* 160*  BUN 10 14 15  28* 30*  CREATININE 1.27* 1.27* 1.31* 2.16* 1.84*  CALCIUM 8.4* 8.5* 8.8* 8.8* 8.9   GFR: Estimated Creatinine Clearance: 45.7 mL/min (A) (by C-G formula based on SCr of 1.84 mg/dL (H)). Liver Function Tests: Recent Labs  Lab 01/15/23 0834  AST 16  ALT 12  ALKPHOS 70  BILITOT 0.3  PROT 8.0  ALBUMIN 3.5   No results for input(s): "LIPASE", "AMYLASE" in the last 168 hours. No results for input(s): "AMMONIA" in the last 168 hours. Coagulation Profile: Recent Labs  Lab 01/15/23 0925  INR 1.0   Cardiac Enzymes: No results for input(s): "CKTOTAL", "CKMB", "CKMBINDEX", "TROPONINI" in the last 168 hours. BNP (last 3 results) No results for input(s): "PROBNP" in the last 8760 hours. HbA1C: No results for input(s): "HGBA1C" in the last 72 hours. CBG: Recent Labs  Lab 01/20/23 1647 01/20/23 2053 01/21/23 0730 01/21/23 1012 01/21/23 1126  GLUCAP 218* 201* 318* 259* 159*   Lipid Profile: No results for input(s): "CHOL", "HDL", "LDLCALC", "TRIG", "CHOLHDL", "LDLDIRECT"  in the last 72 hours. Thyroid Function Tests: No results for input(s): "TSH", "T4TOTAL", "FREET4", "T3FREE", "THYROIDAB" in the last 72 hours. Anemia Panel: No results for input(s): "VITAMINB12", "FOLATE", "FERRITIN", "TIBC", "IRON", "RETICCTPCT" in the last 72 hours.  Most Recent Urinalysis On File:     Component Value Date/Time   COLORURINE COLORLESS (A) 09/14/2022 1754   APPEARANCEUR CLEAR (A) 09/14/2022 1754   APPEARANCEUR Hazy 05/28/2013 2350   LABSPEC 1.021 09/14/2022 1754   LABSPEC 1.015 05/28/2013 2350   PHURINE 5.0 09/14/2022 1754   GLUCOSEU >=500 (A) 09/14/2022 1754   GLUCOSEU >=500 05/28/2013 2350   HGBUR NEGATIVE 09/14/2022 1754   BILIRUBINUR NEGATIVE 09/14/2022 1754   BILIRUBINUR Negative 05/28/2013 2350   KETONESUR NEGATIVE 09/14/2022 1754   PROTEINUR NEGATIVE 09/14/2022 1754   NITRITE NEGATIVE 09/14/2022 1754   LEUKOCYTESUR NEGATIVE 09/14/2022 1754   LEUKOCYTESUR Negative 05/28/2013 2350   Sepsis Labs: @LABRCNTIP (procalcitonin:4,lacticidven:4) Microbiology: Recent Results (from the past 240 hours)  MRSA Next Gen by PCR, Nasal     Status: None   Collection Time: 01/15/23  6:43 PM   Specimen: Nasal Mucosa; Nasal Swab  Result Value Ref Range Status   MRSA by PCR Next Gen NOT DETECTED NOT DETECTED Final    Comment: (NOTE) The GeneXpert MRSA Assay (FDA approved for NASAL specimens only), is one component of a comprehensive MRSA colonization surveillance program. It is not intended to diagnose MRSA infection nor to guide or monitor treatment for MRSA infections. Test performance is not FDA approved in patients less than 34 years old. Performed at [facility], [address], [place], [place] [address]       Radiology Studies last 3 days: DG Knee 1-2 Views Right Result Date: 01/19/2023 CLINICAL DATA:  Right knee pain EXAM: RIGHT KNEE - 1-2 VIEW COMPARISON:  None Available. FINDINGS: Normal alignment. No acute fracture or dislocation. Osteotomy  noted within the visualized proximal fibular diaphysis. No effusion. Vascular stent graft noted within the adductor hiatus and popliteal artery. IMPRESSION: 1. No acute fracture or dislocation. Electronically Signed   By: Helyn Numbers M.D.   On: 01/19/2023 21:03         Silvano Bilis, MD [facility] 01/21/2023, 2:58 PM     If 7PM-7AM, please contact  night coverage [email or web address]

## 2023-01-22 DIAGNOSIS — I998 Other disorder of circulatory system: Secondary | ICD-10-CM | POA: Diagnosis not present

## 2023-01-22 LAB — GLUCOSE, CAPILLARY
Glucose-Capillary: 191 mg/dL — ABNORMAL HIGH (ref 70–99)
Glucose-Capillary: 246 mg/dL — ABNORMAL HIGH (ref 70–99)
Glucose-Capillary: 89 mg/dL (ref 70–99)
Glucose-Capillary: 42 mg/dL — CL (ref 70–99)
Glucose-Capillary: 101 mg/dL — ABNORMAL HIGH (ref 70–99)

## 2023-01-22 LAB — CBC
HCT: 26 % — ABNORMAL LOW (ref 36.0–46.0)
Hemoglobin: 7.4 g/dL — ABNORMAL LOW (ref 12.0–15.0)
MCH: 20.3 pg — ABNORMAL LOW (ref 26.0–34.0)
MCHC: 28.5 g/dL — ABNORMAL LOW (ref 30.0–36.0)
MCV: 71.4 fL — ABNORMAL LOW (ref 80.0–100.0)
Platelets: 267 10*3/uL (ref 150–400)
RBC: 3.64 MIL/uL — ABNORMAL LOW (ref 3.87–5.11)
RDW: 26 % — ABNORMAL HIGH (ref 11.5–15.5)
WBC: 13.3 10*3/uL — ABNORMAL HIGH (ref 4.0–10.5)
nRBC: 0 % (ref 0.0–0.2)

## 2023-01-22 LAB — BASIC METABOLIC PANEL
Anion gap: 8 (ref 5–15)
BUN: 28 mg/dL — ABNORMAL HIGH (ref 6–20)
CO2: 21 mmol/L — ABNORMAL LOW (ref 22–32)
Calcium: 8.7 mg/dL — ABNORMAL LOW (ref 8.9–10.3)
Chloride: 104 mmol/L (ref 98–111)
Creatinine, Ser: 1.54 mg/dL — ABNORMAL HIGH (ref 0.44–1.00)
GFR, Estimated: 42 mL/min — ABNORMAL LOW (ref >60)
Glucose, Bld: 199 mg/dL — ABNORMAL HIGH (ref 70–99)
Potassium: 5.5 mmol/L — ABNORMAL HIGH (ref 3.5–5.1)
Sodium: 133 mmol/L — ABNORMAL LOW (ref 135–145)

## 2023-01-22 MED ORDER — HYDROMORPHONE HCL 2 MG PO TABS
2.0000 mg | ORAL_TABLET | ORAL | Status: DC | PRN
  Administered 2023-01-22 – 2023-01-23 (×5): 2 mg via ORAL
  Filled 2023-01-22 (×5): qty 1

## 2023-01-22 MED ORDER — INSULIN GLARGINE-YFGN 100 UNIT/ML ~~LOC~~ SOLN
40.0000 [IU] | Freq: Two times a day (BID) | SUBCUTANEOUS | Status: DC
Start: 2023-01-22 — End: 2023-01-23
  Administered 2023-01-23 (×2): 40 [IU] via SUBCUTANEOUS
  Filled 2023-01-22 (×3): qty 0.4

## 2023-01-22 MED ORDER — HYDROCODONE-ACETAMINOPHEN 5-325 MG PO TABS
2.0000 | ORAL_TABLET | Freq: Four times a day (QID) | ORAL | Status: DC | PRN
  Administered 2023-01-22 – 2023-01-23 (×4): 2 via ORAL
  Filled 2023-01-22 (×4): qty 2

## 2023-01-22 MED ORDER — HYDROCODONE-ACETAMINOPHEN 5-325 MG PO TABS: 1.0000 | ORAL_TABLET | Freq: Four times a day (QID) | ORAL | Status: DC | PRN

## 2023-01-22 MED ORDER — ONDANSETRON 4 MG PO TBDP: 4.0000 mg | ORAL_TABLET | Freq: Four times a day (QID) | ORAL | Status: DC | PRN

## 2023-01-22 MED ORDER — SODIUM CHLORIDE 0.9 % IV BOLUS
1000.0000 mL | Freq: Once | INTRAVENOUS | Status: AC
  Administered 2023-01-22: 1000 mL via INTRAVENOUS

## 2023-01-22 MED ORDER — SODIUM ZIRCONIUM CYCLOSILICATE 10 G PO PACK
10.0000 g | PACK | Freq: Once | ORAL | Status: AC
  Administered 2023-01-22: 10 g via ORAL
  Filled 2023-01-22: qty 1

## 2023-01-22 MED ORDER — HYDROMORPHONE HCL 2 MG PO TABS: 1.0000 mg | ORAL_TABLET | ORAL | Status: DC | PRN

## 2023-01-22 MED ORDER — INSULIN GLARGINE-YFGN 100 UNIT/ML ~~LOC~~ SOLN
45.0000 [IU] | Freq: Two times a day (BID) | SUBCUTANEOUS | Status: DC
Start: 2023-01-22 — End: 2023-01-22
  Filled 2023-01-22: qty 0.45

## 2023-01-22 NOTE — Progress Notes (Signed)
PT Cancellation Note  Patient Details Name: Jennifer [name] MRN: [id number] DOB: [birth year]   Cancelled Treatment:     PT attempt, 2nd attempt today. First attempt, pt endorses severe pain and requested author return after lunch. 2nd attempt, Pt just transferred OOB with RN staff to recliner. Untouched lunch tray in front of her. Encouraged pt to increase intake but she was unwilling. " Can you come back at 3pm?" Thereasa Parkin will return later this date as requested.    Rushie Chestnut 01/22/2023, 1:08 PM

## 2023-01-22 NOTE — Progress Notes (Signed)
Inpatient Rehab Admissions Coordinator:   Spouse contacted me early this morning to let me know pt is agreeable to CIR. I will start insurance auth this morning.    Estill Dooms, PT, DPT Admissions Coordinator [phone] 01/22/23  9:01 AM

## 2023-01-22 NOTE — Progress Notes (Signed)
Physical Therapy Treatment Patient Details Name: Jennifer [name] MRN: [id number] DOB: August 18, 1980 Today's Date: 01/22/2023   History of Present Illness Jennifer [name] is a 42 y.o. female with medical history significant of PAF on aspirin, HTN, IDDM, chronic diabetic neuropathy, presented with worsening of right foot pain. chronic bilateral feet pain right more than left which she attributed to diabetic neuropathy and her gabapentin was increased recently to address that issue.  2 days ago patient started to have right calf pain, claudication worsening with ambulation and last night patient woke up with severe pain of right foot. 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    PT Comments  Pt agrees to session on 3rd attempt. She endorses 12/10 pain. RN gave pain meds while Thereasa Parkin was in room. Pt overall self limiting/ pain limited. She was agreeable to standing and getting back to bed but unwilling to advance gait or activity due to pain. She did perform several LAQ/ HS stretching exercises but unwilling to perform anything else. Author educated pt on importance of stretching, positioning, importance of maintaining proper blood sugar range and what to expect if she is to go to CIR level of rehab.   Pt stated understanding but will require re-education and encouragement to maximize her independence and safety with all ADLs. DC recs remain appropriate.     If plan is discharge home, recommend the following: A little help with walking and/or transfers;Assistance with cooking/housework;A little help with bathing/dressing/bathroom;Assist for transportation;Help with stairs or ramp for entrance     Equipment Recommendations  Other (comment) (Defer to next level of care)       Precautions / Restrictions Precautions Precautions: Fall Restrictions Weight Bearing Restrictions Per Provider Order: Yes RLE Weight Bearing Per Provider Order: Weight bearing as tolerated Other Position/Activity Restrictions: s/p R BKA      Mobility  Bed Mobility    General bed mobility comments: Pt was in recliner pre/post session    Transfers Overall transfer level: Needs assistance Equipment used: Rolling walker (2 wheels) Transfers: Sit to/from Stand Sit to Stand: Min assist  General transfer comment: min assist + alot of vcs and increased time to perform transfers. pain limited overall.    Ambulation/Gait Ambulation/Gait assistance: Contact guard assist Gait Distance (Feet): 4 Feet Assistive device: Rolling walker (2 wheels) Gait Pattern/deviations: Step-to pattern, Antalgic Gait velocity: decreased  General Gait Details: pt endorses throbbing in standing and only willing to ambulate from recliner back to EOB. RN gave pain meds while Thereasa Parkin was in room.    Balance Overall balance assessment: Needs assistance Sitting-balance support: Feet supported Sitting balance-Leahy Scale: Good     Standing balance support: Bilateral upper extremity supported, During functional activity, Reliant on assistive device for balance Standing balance-Leahy Scale: Fair         Cognition Arousal: Alert Behavior During Therapy: WFL for tasks assessed/performed Overall Cognitive Status: Within Functional Limits for tasks assessed    General Comments: Pleasant, motivated to participate           General Comments General comments (skin integrity, edema, etc.): Lengthy education of positioning, stretching, and ther ex to promote strengthening. tp states understanding. educated pt on CIR level of care and 3 hours of therapy daily. Pt stated understanding but will continue to require encouragement for increasing daily activity to promote maximize independnece.      Pertinent Vitals/Pain Pain Assessment Pain Assessment: 0-10 Pain Score:  (12/10) Pain Location: R knee, phantom pain Pain Descriptors / Indicators: Discomfort, Grimacing, Moaning Pain  Intervention(s): Limited activity within patient's tolerance, Monitored  during session, Premedicated before session, Repositioned     PT Goals (current goals can now be found in the care plan section) Acute Rehab PT Goals Patient Stated Goal: Go home and be with my family Progress towards PT goals: Progressing toward goals    Frequency    Min 1X/week       AM-PAC PT "6 Clicks" Mobility   Outcome Measure  Help needed turning from your back to your side while in a flat bed without using bedrails?: A Little Help needed moving from lying on your back to sitting on the side of a flat bed without using bedrails?: A Little Help needed moving to and from a bed to a chair (including a wheelchair)?: A Little Help needed standing up from a chair using your arms (e.g., wheelchair or bedside chair)?: A Little Help needed to walk in hospital room?: A Little Help needed climbing 3-5 steps with a railing? : A Lot 6 Click Score: 17    End of Session   Activity Tolerance: Patient limited by pain Patient left: in bed;with call bell/phone within reach;with bed alarm set Nurse Communication: Mobility status PT Visit Diagnosis: Difficulty in walking, not elsewhere classified (R26.2);Pain;Other abnormalities of gait and mobility (R26.89) Pain - Right/Left: Right Pain - part of body: Leg     Time: 1415-1431 PT Time Calculation (min) (ACUTE ONLY): 16 min  Charges:    $Therapeutic Activity: 8-22 mins PT General Charges $$ ACUTE PT VISIT: 1 Visit                    Jetta Lout PTA 01/22/23, 3:11 PM

## 2023-01-22 NOTE — Progress Notes (Signed)
PROGRESS NOTE    Jennifer [name]   UXL:[id number] DOB: 08/06/1980  DOA: 01/15/2023 Date of Service: 01/22/23 which is hospital day 7  PCP: [facility]    HPI: Jennifer [name] is a 42 y.o. female with medical history significant of PAF on aspirin, HTN, IDDM, chronic diabetic neuropathy, presented with worsening of right foot pain. chronic bilateral feet pain right more than left which she attributed to diabetic neuropathy and her gabapentin was increased recently to address that issue.  2 days ago patient started to have right calf pain, claudication worsening with ambulation and last night patient woke up with severe pain of right foot.   Hospital course / significant events:  12/12: to ED,  (+)acute right lower extremity ischemia.  Vascular surgery consulted and Dr. Wyn Quaker took pt to OR for angiogram w/ stent placement to R popliteal A, mechanical thrombectomy to R anterior tibial A --> aggrastat drip. Note Hgb 7.5 on admission. Consideration for BKA 12/13: Foot remains cyanotic. Hgb 5.9, 2 unit PRBC. D/c aggrastat, back on heparin. Persistent ischemic pain, pt amenable to amputation. Plan for BKA planned for tomorrow pending correction of anemia.  12/14: additional unit PRBC this morning. R BKA. Pain control remains a challenge  12/15: PT/OT eval pending. Pain control remains a challenge, switched gabapentin to lyrica   Consultants:  Vascular surgery   Procedures/Surgeries: 01/15/23: RLE angiogram, stent placement to R popliteal A, mechanical thrombectomy to R anterior tibial A  01/17/23: Right lower extremity below knee amputation, anesthesia also performed adductor canal nerve block      ASSESSMENT & PLAN:  Acute right lower limb ischemia - suspect due to embolic event, underlying PVD.   POD2 S/p RLE angiography w/ stent to R popliteal A, thrombectomy to R anterior tibial A Persistent pain probably from occluded popliteal stent Vascular surgery following   Education on risk factor modification, including smoking cessation, medications, glucose management Continue statin X-ray negative Pain control, continue robaxin, gabapentin, dilaudid. Transition to orals today Dressing changed today   Paroxysmal atrial fibrillation Pt has been on ASA alone, no anticoagulation given history heavy menstrual bleeding Continue Cardizem = increased dose given persistent tachycardia and BP above goal  Eliquis started  Will need to monitor closely for bleeding, slight drop in hgb today   Chronic iron deficiency anemia Acute anemia w/ concern for ABLA d/t procedure  Diagnosed last year, not on any iron supplement.  History iron deficiency anemia from bleeding fibroids. Recently started on depo, reports having some mild vaginal spotting that is normal for her 2 unit PRBC 01/16/23, 1 unit PRBC 01/17/23   continue po supplementation on discharge  Caution on anticoagulation - monitor CBC/HH, slight drop today Will need outpt gyn f/u   IDDM with hyperglycemia Glucose markedly elevated today  Up-titrate semglee to 45 from 40 Continue mealtime at 15 Continue ssi  Constipation Resolved - continue bowel regimen  Diabetic neuropathy Gabapentin as above  HTN Normotensive - dilt - hold lisinopril 2/2 aki - home hydrochlorothiazidealso on hold   AKI Hyperkalemia Probably prerenal from reduced po, and lisinopril. Improved with fluids bolus and insulin but hyperkalemic again today - another fluid bolus - lokelma x1   Class 1 obesity based on BMI: Body mass index is 34.38 kg/m.  Underweight - under 18.5  normal weight - 18.5 to 24.9 overweight - 25 to 29.9 obese - 30 or more   DVT prophylaxis: apixaban IV fluids: no continuous IV fluids  Nutrition: diabetic/cardiac  Central lines / invasive devices: none  Code Status: FULL CODE ACP documentation reviewed: none on file in [facility]  Dispo: CIR  may have bed tomorrow       Subjective /  Brief ROS:  Reports ongoing pain right stump, worked with PT, had dressing change, BM this morning  Family Communication: husband updated telephonically 12/18    Objective Findings:  Vitals:   01/21/23 1448 01/21/23 2035 01/22/23 0333 01/22/23 0741  BP: 119/67 128/66 117/64 119/71  Pulse: (!) 109 96 95 92  Resp: 18 18 18 18   Temp: 98.2 F (36.8 C) 99 F (37.2 C) 99.1 F (37.3 C) 98.5 F (36.9 C)  TempSrc:  Oral Oral Oral  SpO2: 98% 97% 95% 98%  Weight:      Height:        Intake/Output Summary (Last 24 hours) at 01/22/2023 1300 Last data filed at 01/21/2023 1719 Gross per 24 hour  Intake 0 ml  Output --  Net 0 ml    Filed Weights   01/15/23 0822 01/15/23 0838 01/15/23 1525  Weight: 96.6 kg 96.6 kg 96.6 kg    Examination:  NAD,  RRR CTAB Abdomen soft,non-tender Right BKA is wrapped/  LLE warm       Scheduled Medications:   apixaban  5 mg Oral BID   Chlorhexidine Gluconate Cloth  6 each Topical Daily   diltiazem  180 mg Oral Daily   gabapentin  400 mg Oral TID   insulin aspart  0-20 Units Subcutaneous TID WC   insulin aspart  0-5 Units Subcutaneous QHS   insulin aspart  15 Units Subcutaneous TID WC   insulin glargine-yfgn  45 Units Subcutaneous BID   methocarbamol  1,000 mg Oral TID   nicotine  21 mg Transdermal Daily   polyethylene glycol  17 g Oral Daily   senna  1 tablet Oral Daily   traZODone  100 mg Oral QHS    Continuous Infusions:  ondansetron (ZOFRAN) IV 216 mL/hr at 01/21/23 1719    PRN Medications:  alum & mag hydroxide-simeth, HYDROcodone-acetaminophen, HYDROmorphone, ondansetron (ZOFRAN) IV, mouth rinse  Antimicrobials from admission:  Anti-infectives (From admission, onward)    Start     Dose/Rate Route Frequency Ordered Stop   01/15/23 1459  ceFAZolin (ANCEF) IVPB 2g/100 mL premix        2 g 200 mL/hr over 30 Minutes Intravenous 30 min pre-op 01/15/23 1459 01/15/23 1646           Data Reviewed:  I have personally  reviewed the following...  CBC: Recent Labs  Lab 01/17/23 0416 01/17/23 1453 01/18/23 0549 01/19/23 0551 01/21/23 0533 01/22/23 0527  WBC 12.8*  --  13.5* 10.2 14.1* 13.3*  HGB 7.6* 8.2* 8.2* 8.1* 7.8* 7.4*  HCT 26.2* 27.5* 28.0* 28.3* 27.9* 26.0*  MCV 67.0*  --  70.2* 71.5* 72.5* 71.4*  PLT 342  --  295 296 297 267   Basic Metabolic Panel: Recent Labs  Lab 01/19/23 0951 01/20/23 0322 01/21/23 0533 01/21/23 1152 01/22/23 0527  NA 129* 132* 130* 134* 133*  K 5.1 4.7 6.1* 4.5 5.5*  CL 98 100 101 102 104  CO2 20* 23 20* 23 21*  GLUCOSE 623* 391* 313* 160* 199*  BUN 14 15 28* 30* 28*  CREATININE 1.27* 1.31* 2.16* 1.84* 1.54*  CALCIUM 8.5* 8.8* 8.8* 8.9 8.7*   GFR: Estimated Creatinine Clearance: 54.6 mL/min (A) (by C-G formula based on SCr of 1.54 mg/dL (H)). Liver Function Tests: No results for  input(s): "AST", "ALT", "ALKPHOS", "BILITOT", "PROT", "ALBUMIN" in the last 168 hours.  No results for input(s): "LIPASE", "AMYLASE" in the last 168 hours. No results for input(s): "AMMONIA" in the last 168 hours. Coagulation Profile: No results for input(s): "INR", "PROTIME" in the last 168 hours.  Cardiac Enzymes: No results for input(s): "CKTOTAL", "CKMB", "CKMBINDEX", "TROPONINI" in the last 168 hours. BNP (last 3 results) No results for input(s): "PROBNP" in the last 8760 hours. HbA1C: No results for input(s): "HGBA1C" in the last 72 hours. CBG: Recent Labs  Lab 01/21/23 1617 01/21/23 2109 01/21/23 2244 01/22/23 0742 01/22/23 1104  GLUCAP 149* 64* 162* 191* 246*   Lipid Profile: No results for input(s): "CHOL", "HDL", "LDLCALC", "TRIG", "CHOLHDL", "LDLDIRECT" in the last 72 hours. Thyroid Function Tests: No results for input(s): "TSH", "T4TOTAL", "FREET4", "T3FREE", "THYROIDAB" in the last 72 hours. Anemia Panel: No results for input(s): "VITAMINB12", "FOLATE", "FERRITIN", "TIBC", "IRON", "RETICCTPCT" in the last 72 hours.  Most Recent Urinalysis On File:      Component Value Date/Time   COLORURINE COLORLESS (A) 09/14/2022 1754   APPEARANCEUR CLEAR (A) 09/14/2022 1754   APPEARANCEUR Hazy 05/28/2013 2350   LABSPEC 1.021 09/14/2022 1754   LABSPEC 1.015 05/28/2013 2350   PHURINE 5.0 09/14/2022 1754   GLUCOSEU >=500 (A) 09/14/2022 1754   GLUCOSEU >=500 05/28/2013 2350   HGBUR NEGATIVE 09/14/2022 1754   BILIRUBINUR NEGATIVE 09/14/2022 1754   BILIRUBINUR Negative 05/28/2013 2350   KETONESUR NEGATIVE 09/14/2022 1754   PROTEINUR NEGATIVE 09/14/2022 1754   NITRITE NEGATIVE 09/14/2022 1754   LEUKOCYTESUR NEGATIVE 09/14/2022 1754   LEUKOCYTESUR Negative 05/28/2013 2350   Sepsis Labs: @LABRCNTIP (procalcitonin:4,lacticidven:4) Microbiology: Recent Results (from the past 240 hours)  MRSA Next Gen by PCR, Nasal     Status: None   Collection Time: 01/15/23  6:43 PM   Specimen: Nasal Mucosa; Nasal Swab  Result Value Ref Range Status   MRSA by PCR Next Gen NOT DETECTED NOT DETECTED Final    Comment: (NOTE) The GeneXpert MRSA Assay (FDA approved for NASAL specimens only), is one component of a comprehensive MRSA colonization surveillance program. It is not intended to diagnose MRSA infection nor to guide or monitor treatment for MRSA infections. Test performance is not FDA approved in patients less than 33 years old. Performed at [facility], [address] [place], [place] [address]       Radiology Studies last 3 days: DG Knee 1-2 Views Right Result Date: 01/19/2023 CLINICAL DATA:  Right knee pain EXAM: RIGHT KNEE - 1-2 VIEW COMPARISON:  None Available. FINDINGS: Normal alignment. No acute fracture or dislocation. Osteotomy noted within the visualized proximal fibular diaphysis. No effusion. Vascular stent graft noted within the adductor hiatus and popliteal artery. IMPRESSION: 1. No acute fracture or dislocation. Electronically Signed   By: Helyn Numbers M.D.   On: 01/19/2023 21:03         Silvano Bilis, MD [facility] 01/22/2023, 1:00 PM     If 7PM-7AM, please contact night coverage [email or web address]

## 2023-01-22 NOTE — Progress Notes (Signed)
Progress Note    01/22/2023 9:12 AM 5 Days Post-Op  Subjective:   Jennifer [name] is a 42 yo female now POD # 4 from right BKA. Patient complains of 8/10 pain again today. Her pain appears to be getting better.  Patient was counseled this morning again on the exercises needed in her amputated leg to prevent contracture. She is refusing to do them thus precipitating contracture and increased pain. We discussed in detail if her stump becomes contracted and irreversible she will need an above the knee amputation to help resolve the pain. Patient is being treated for pain with multiple medications covering nerve pain and muscle spasms as well as narcotics for surgical pain by the Hospitalist team.    Vitals:   01/22/23 0333 01/22/23 0741  BP: 117/64 119/71  Pulse: 95 92  Resp: 18 18  Temp: 99.1 F (37.3 C) 98.5 F (36.9 C)  SpO2: 95% 98%   Physical Exam: Cardiac:  RRR, Normal S1, S2. No Murmurs Lungs: Clear on auscultation to without.  Normal labored breathing with no rales rhonchi or wheezing noted. Incisions: Right below the knee amputation.  Dressing changed today at the bedside.  No complications to note.  No hematoma seroma or infection to note.  Staples already intact. Extremities: Right lower extremity BKA.  Dressing changed today at the bedside by vascular surgery.  Left lower extremity warm to touch with palpable pulses. Abdomen: Positive bowel sounds throughout, soft, nontender nondistended. Neurologic: Third and oriented x 3, answers all questions and follows commands appropriately.  CBC    Component Value Date/Time   WBC 13.3 (H) 01/22/2023 0527   RBC 3.64 (L) 01/22/2023 0527   HGB 7.4 (L) 01/22/2023 0527   HGB 12.0 06/01/2013 1805   HCT 26.0 (L) 01/22/2023 0527   HCT 39.4 06/01/2013 1805   PLT 267 01/22/2023 0527   PLT 325 06/01/2013 1805   MCV 71.4 (L) 01/22/2023 0527   MCV 71 (L) 06/01/2013 1805   MCH 20.3 (L) 01/22/2023 0527   MCHC 28.5 (L) 01/22/2023 0527    RDW 26.0 (H) 01/22/2023 0527   RDW 18.1 (H) 06/01/2013 1805   LYMPHSABS 2.1 01/15/2023 0834   MONOABS 0.8 01/15/2023 0834   EOSABS 0.3 01/15/2023 0834   BASOSABS 0.0 01/15/2023 0834    BMET    Component Value Date/Time   NA 133 (L) 01/22/2023 0527   NA 135 (L) 06/01/2013 1805   K 5.5 (H) 01/22/2023 0527   K 3.6 06/01/2013 1805   CL 104 01/22/2023 0527   CL 103 06/01/2013 1805   CO2 21 (L) 01/22/2023 0527   CO2 25 06/01/2013 1805   GLUCOSE 199 (H) 01/22/2023 0527   GLUCOSE 179 (H) 06/01/2013 1805   BUN 28 (H) 01/22/2023 0527   BUN 11 06/01/2013 1805   CREATININE 1.54 (H) 01/22/2023 0527   CREATININE 0.79 06/01/2013 1805   CALCIUM 8.7 (L) 01/22/2023 0527   CALCIUM 9.1 06/01/2013 1805   GFRNONAA 42 (L) 01/22/2023 0527   GFRNONAA >60 06/01/2013 1805   GFRAA >60 10/08/2016 1820   GFRAA >60 06/01/2013 1805    INR    Component Value Date/Time   INR 1.0 01/15/2023 0925     Intake/Output Summary (Last 24 hours) at 01/22/2023 0912 Last data filed at 01/21/2023 1719 Gross per 24 hour  Intake 0 ml  Output --  Net 0 ml     Assessment/Plan:  42 y.o. female is s/p right lower extremity BKA   Plan: First Dressing  change completed by vascular surgery this morning.  No complications to note.  No hematoma seroma or infection to note.  Staples all well intact.  Dressing changes daily by nursing staff as ordered.  Patient to continue on home prescribed Eliquis 5 mg twice daily. Okay per vascular surgery for patient to discharge to rehab if medically stable. Patient must do exercises to prevent early contracture every hour while awake.  Continue pain medications as needed. OOB to chair most of the day. Continue to work with PT/OT.  Continue exercises to prevent contracture. Ambulate with assistance. Continue muscle relaxers and nerve agents as prescribed 5 Days Post-Op   DVT prophylaxis:   Eliquis 5 mg twice daily    [facility] 01/22/2023 9:12 AM

## 2023-01-22 NOTE — Progress Notes (Signed)
Occupational Therapy Treatment Patient Details Name: Jennifer [name] MRN: [id number] DOB: 10-19-1980 Today's Date: 01/22/2023   History of present illness Jennifer [name] is a 42 y.o. female with medical history significant of PAF on aspirin, HTN, IDDM, chronic diabetic neuropathy, presented with worsening of right foot pain. chronic bilateral feet pain right more than left which she attributed to diabetic neuropathy and her gabapentin was increased recently to address that issue.  2 days ago patient started to have right calf pain, claudication worsening with ambulation and last night patient woke up with severe pain of right foot. 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   OT comments  Jennifer [name] was seen for OT treatment on this date. Upon arrival to room pt reclined in bed, reports fatigue/pain from recent PT session but agreeable to seated therex. Pt tolerated exercises as described below with cues for technique. Pt educated on HEP, desensitization techniques with good return demonstration, and non pharmacological pain mgmt strategies. Pt reports feeling depressed about being in the hospital over Christmas. Identifies crosswords as preferred leisure activity, provided. Pt requires MIN A sup>sit for RLE mgmt. Pt making good progress toward goals, will continue to follow POC. Discharge recommendation remains appropriate.       If plan is discharge home, recommend the following:  A lot of help with bathing/dressing/bathroom;A lot of help with walking and/or transfers;Assist for transportation;Assistance with cooking/housework;Help with stairs or ramp for entrance;Assistance with feeding   Equipment Recommendations  BSC/3in1;Wheelchair (measurements OT);Wheelchair cushion (measurements OT)    Recommendations for Other Services      Precautions / Restrictions Precautions Precautions: Fall Restrictions Weight Bearing Restrictions Per Provider Order: Yes RLE Weight Bearing Per Provider Order: Weight bearing  as tolerated Other Position/Activity Restrictions: s/p R BKA       Mobility Bed Mobility Overal bed mobility: Needs Assistance Bed Mobility: Supine to Sit     Supine to sit: Min assist          Transfers                   General transfer comment: pt deferred citing fatigue from recent PT session     Balance Overall balance assessment: Needs assistance Sitting-balance support: Feet supported Sitting balance-Leahy Scale: Good                                     ADL either performed or assessed with clinical judgement   ADL Overall ADL's : Needs assistance/impaired                                       General ADL Comments: SETUP seated grooming tasks      Cognition Arousal: Alert Behavior During Therapy: WFL for tasks assessed/performed Overall Cognitive Status: Within Functional Limits for tasks assessed                                 General Comments: reports feeling depressed        Exercises Exercises: Amputee Amputee Exercises Quad Sets: AROM, Strengthening, Right, 5 reps, Seated Hip ABduction/ADduction: AROM, Strengthening, Right, 5 reps, Seated Knee Flexion: AROM, Strengthening, Right, 5 reps, Seated Knee Extension: AROM, Strengthening, Right, 5 reps, Seated    Shoulder Instructions  General Comments Educated on pain mgmt techniques and HEP    Pertinent Vitals/ Pain       Pain Assessment Pain Assessment: 0-10 Pain Score: 10-Worst pain ever Pain Location: R knee, phantom pain Pain Descriptors / Indicators: Discomfort, Grimacing, Moaning Pain Intervention(s): Limited activity within patient's tolerance, Repositioned   Frequency  Min 1X/week        Progress Toward Goals  OT Goals(current goals can now be found in the care plan section)  Progress towards OT goals: Progressing toward goals  Acute Rehab OT Goals OT Goal Formulation: With patient Time For Goal Achievement:  02/01/23 Potential to Achieve Goals: Good ADL Goals Pt Will Perform Grooming: sitting;Independently Pt Will Perform Lower Body Dressing: sitting/lateral leans;with modified independence;with adaptive equipment Pt Will Transfer to Toilet: regular height toilet;ambulating;with supervision;with set-up Pt Will Perform Toileting - Clothing Manipulation and hygiene: sitting/lateral leans;sit to/from stand;with modified independence;with adaptive equipment  Plan      Co-evaluation                 AM-PAC OT "6 Clicks" Daily Activity     Outcome Measure   Help from another person eating meals?: None Help from another person taking care of personal grooming?: A Little Help from another person toileting, which includes using toliet, bedpan, or urinal?: A Little Help from another person bathing (including washing, rinsing, drying)?: A Lot Help from another person to put on and taking off regular upper body clothing?: A Little Help from another person to put on and taking off regular lower body clothing?: A Lot 6 Click Score: 17    End of Session    OT Visit Diagnosis: Other abnormalities of gait and mobility (R26.89);Muscle weakness (generalized) (M62.81);Pain Pain - Right/Left: Right Pain - part of body: Knee;Leg   Activity Tolerance Patient tolerated treatment well   Patient Left in bed;with call bell/phone within reach;with bed alarm set   Nurse Communication          Time: 0981-1914 OT Time Calculation (min): 21 min  Charges: OT General Charges $OT Visit: 1 Visit OT Treatments $Therapeutic Exercise: 8-22 mins  Kathie Dike, M.S. OTR/L  01/22/23, 3:34 PM  ascom [phone]

## 2023-01-22 NOTE — Inpatient Diabetes Management (Signed)
Inpatient Diabetes Program Recommendations  AACE/ADA: New Consensus Statement on Inpatient Glycemic Control   Target Ranges:  Prepandial:   less than 140 mg/dL      Peak postprandial:   less than 180 mg/dL (1-2 hours)      Critically ill patients:  140 - 180 mg/dL    Latest Reference Range & Units 01/22/23 07:42 01/22/23 11:04  Glucose-Capillary 70 - 99 mg/dL 161 (H)  Novolog 19 units @9 :33 am  Semglee 40 units @9 :44 246 (H)  Novolog 22 units @12 :26      Latest Reference Range & Units 01/21/23 07:30 01/21/23 10:12 01/21/23 11:26 01/21/23 16:17 01/21/23 21:09 01/21/23 22:44  Glucose-Capillary 70 - 99 mg/dL 096 (H)  Novolog 35 units 259 (H) 159 (H)  Novolog 19 units  Semglee 40 units @11 :49 149 (H)  Novolog 18 units 64 (L) 162 (H)   Review of Glycemic Control  Diabetes history: DM2 Outpatient Diabetes medications: 70/30 45 units TID with meals, Metformin 1000 mg BID Current orders for Inpatient glycemic control: Semglee 45 units BID, Novolog 0-20 units TID with meals, Novolog 0-5 units at bedtime, Novolog 15 units TID with meals  Inpatient Diabetes Program Recommendations:    Insulin: Noted patent received Novolog correction and meal coverage at 17:18 on 01/21/23 and per chart 0% of meal eaten. Following CBG down to 64 mg/dl at 04:54 on 09/81/19. Per MAR, bedtime dose of Semglee NOT GIVEN last night. As a result, glucose 191 and 246 mg/dl today. Recommend to decrease Semglee back down to 40 units BID.   NURSING: Please be sure patient eats at least 50% of meals when giving Novolog meal coverage insulin.  Thanks, Orlando Penner, RN, MSN, CDCES Diabetes Coordinator Inpatient Diabetes Program [phone] (Team Pager from 8am to 5pm)

## 2023-01-22 NOTE — Progress Notes (Signed)
Hypoglycemic Event  CBG: 42  Treatment: 8 oz juice/soda  Symptoms: None  Follow-up CBG: Time:2031 CBG Result:101  Possible Reasons for Event: Inadequate meal intake  Comments/MD notified: Pt asymptomatic, snack given.    Jennifer [name] N Jennifer [name]

## 2023-01-22 NOTE — PMR Pre-admission (Signed)
PMR Admission Coordinator Pre-Admission Assessment  Patient: Jennifer [name] is an 42 y.o., female MRN: [id number] DOB: Mar 23, 1980 Height: 5\' 6"  (167.6 cm) Weight: 96.6 kg  Insurance Information HMO:     PPO:      PCP:      IPA:      80/20:      OTHER:  PRIMARY: [employer]      Policy#: [id number]      Subscriber: pt CM Name: Brett Canales      Phone#: not provided     Fax#: [phone] Pre-Cert#: [id number] auth for CIR from Brett Canales with [employer] for admit 12/20 with updates 12/26      Employer: n/a Benefits:  Phone #: [phone]     Name:  Eff. Date: 05/05/22     Deduct: $0      Out of Pocket Max: $0      Life Max:  CIR: 100%      SNF: 100% Outpatient:      Co-Pay:  Home Health:       Co-Pay:  DME:      Co-Pay:  Providers:  SECONDARY:       Policy#:      Phone#:   Artist:        Phone#:   The Data processing manager" for patients in Inpatient Rehabilitation Facilities with attached "Privacy Act Statement-Health Care Records" was provided and verbally reviewed with: Patient and Family  Emergency Contact Information Contact Information     Name Relation Home Work Mobile   King,Jamie Spouse   [phone]      Other Contacts     Name Relation Home Work Mobile   Allison,Wanda Relative   [phone]   King,Chiffon S Relative [phone]         Current Medical History  Patient Admitting Diagnosis: R BKA  History of Present Illness: Pt is a 42 y/o female with PMH of PAF on aspirin, HTN, DM, diabetic neuropathy, who presented to [facility] on 12/12 with c/o bilateral foot pain, R>L.  In ED pt afebrile and non-hypoxic, BP 176/103. Glucose on arrival >600.  Doppler indicated acute RLE ischemia.   Vascular consulted and unable to get a doppler DP/PT pulse.  Noted foot cold to touch with lack of capillary refill.  She went to OR with Dr. Wyn Quaker on 12/12 for angiogram and stent to right popliteal artery and mechanical thrombectomy of right  anterior tibial artery.  POD appears attempts at revascularlization unsuccesful and pt was recommended for a BKA, which Dr. Wyn Quaker performed on 12/14.  Post op course pain management, DM management, ABLA, AKI.  Therapy evaluations completed and pt was recommended for CIR>     Patient's medical record from [facility] has been reviewed by the rehabilitation admission coordinator and physician.  Past Medical History  Past Medical History:  Diagnosis Date   Diabetes mellitus without complication (HCC)    Hypertension     Has the patient had major surgery during 100 days prior to admission? Yes  Family History   family history includes Diabetes in an other family member; Hypertension in an other family member.  Current Medications  Current Facility-Administered Medications:    alum & mag hydroxide-simeth (MAALOX/MYLANTA) 200-200-20 MG/5ML suspension 5 mL, 5 mL, Oral, PRN, Sunnie Nielsen, DO, 5 mL at 01/18/23 2221   apixaban (ELIQUIS) tablet 5 mg, 5 mg, Oral, BID, Sunnie Nielsen, DO, 5 mg at 01/22/23 2027   Chlorhexidine Gluconate Cloth 2 % PADS 6 each, 6  each, Topical, Daily, Sunnie Nielsen, DO, 6 each at 01/22/23 0935   diltiazem (CARDIZEM CD) 24 hr capsule 180 mg, 180 mg, Oral, Daily, Wouk, Wilfred Curtis, MD, 180 mg at 01/22/23 0935   gabapentin (NEURONTIN) capsule 400 mg, 400 mg, Oral, TID, Wouk, Wilfred Curtis, MD, 400 mg at 01/22/23 2027   HYDROcodone-acetaminophen (NORCO/VICODIN) 5-325 MG per tablet 2 tablet, 2 tablet, Oral, Q6H PRN, Kathrynn Running, MD, 2 tablet at 01/23/23 0348   HYDROmorphone (DILAUDID) tablet 2 mg, 2 mg, Oral, Q4H PRN, Kathrynn Running, MD, 2 mg at 01/23/23 1610   insulin aspart (novoLOG) injection 0-20 Units, 0-20 Units, Subcutaneous, TID WC, Sunnie Nielsen, DO, 7 Units at 01/22/23 1226   insulin aspart (novoLOG) injection 0-5 Units, 0-5 Units, Subcutaneous, QHS, Annice Needy, MD, 2 Units at 01/20/23 2118   insulin aspart (novoLOG) injection 15 Units, 15  Units, Subcutaneous, TID WC, Wouk, Wilfred Curtis, MD, 15 Units at 01/22/23 1743   insulin glargine-yfgn (SEMGLEE) injection 40 Units, 40 Units, Subcutaneous, BID, Wouk, Wilfred Curtis, MD, 40 Units at 01/23/23 0012   methocarbamol (ROBAXIN) tablet 1,000 mg, 1,000 mg, Oral, TID, Sunnie Nielsen, DO, 1,000 mg at 01/22/23 2027   nicotine (NICODERM CQ - dosed in mg/24 hours) patch 21 mg, 21 mg, Transdermal, Daily, Dew, Marlow Baars, MD, 21 mg at 01/19/23 1023   ondansetron (ZOFRAN-ODT) disintegrating tablet 4 mg, 4 mg, Oral, Q6H PRN, Wouk, Wilfred Curtis, MD   Oral care mouth rinse, 15 mL, Mouth Rinse, PRN, Sunnie Nielsen, DO   polyethylene glycol (MIRALAX / GLYCOLAX) packet 17 g, 17 g, Oral, Daily, Wouk, Wilfred Curtis, MD, 17 g at 01/22/23 0935   senna (SENOKOT) tablet 8.6 mg, 1 tablet, Oral, Daily, Wouk, Wilfred Curtis, MD, 8.6 mg at 01/22/23 0935   traZODone (DESYREL) tablet 100 mg, 100 mg, Oral, QHS, Sunnie Nielsen, DO, 100 mg at 01/22/23 2152  Patients Current Diet:  Diet Order             Diet heart healthy/carb modified Room service appropriate? Yes; Fluid consistency: Thin  Diet effective now                   Precautions / Restrictions Precautions Precautions: Fall Restrictions Weight Bearing Restrictions Per Provider Order: Yes RLE Weight Bearing Per Provider Order: Weight bearing as tolerated Other Position/Activity Restrictions: s/p R BKA   Has the patient had 2 or more falls or a fall with injury in the past year? No  Prior Activity Level Limited Community (1-2x/wk): independent, no DME at baseline  Prior Functional Level Self Care: Did the patient need help bathing, dressing, using the toilet or eating? Independent  Indoor Mobility: Did the patient need assistance with walking from room to room (with or without device)? Independent  Stairs: Did the patient need assistance with internal or external stairs (with or without device)? Independent  Functional Cognition:  Did the patient need help planning regular tasks such as shopping or remembering to take medications? Independent  Patient Information Are you of Hispanic, Latino/a,or Spanish origin?: A. No, not of Hispanic, Latino/a, or Spanish origin What is your race?: B. Black or African American Do you need or want an interpreter to communicate with a doctor or health care staff?: 0. No  Patient's Response To:  Health Literacy and Transportation Is the patient able to respond to health literacy and transportation needs?: Yes Health Literacy - How often do you need to have someone help you when you read instructions, pamphlets, or  other written material from your doctor or pharmacy?: Never In the past 12 months, has lack of transportation kept you from medical appointments or from getting medications?: No In the past 12 months, has lack of transportation kept you from meetings, work, or from getting things needed for daily living?: No  Home Assistive Devices / Equipment Home Equipment: Crutches  Prior Device Use: Indicate devices/aids used by the patient prior to current illness, exacerbation or injury? None of the above  Current Functional Level Cognition  Overall Cognitive Status: Within Functional Limits for tasks assessed Orientation Level: Oriented X4 General Comments: reports feeling depressed    Extremity Assessment (includes Sensation/Coordination)  Upper Extremity Assessment: Overall WFL for tasks assessed  Lower Extremity Assessment: RLE deficits/detail, Defer to PT evaluation RLE Deficits / Details: s/p R BKA, post op bandages CDI at start/end of session. RLE: Unable to fully assess due to pain    ADLs  Overall ADL's : Needs assistance/impaired Functional mobility during ADLs: Contact guard assist, Cueing for safety, Rolling walker (2 wheels) General ADL Comments: SETUP seated grooming tasks    Mobility  Overal bed mobility: Needs Assistance Bed Mobility: Supine to Sit Supine  to sit: Min assist Sit to supine: Min assist General bed mobility comments: Pt was in recliner pre/post session    Transfers  Overall transfer level: Needs assistance Equipment used: Rolling walker (2 wheels) Transfers: Sit to/from Stand Sit to Stand: Min assist Bed to/from chair/wheelchair/BSC transfer type:: Squat pivot Stand pivot transfers: Contact guard assist Squat pivot transfers: Contact guard assist Step pivot transfers: Min assist General transfer comment: pt deferred citing fatigue from recent PT session    Ambulation / Gait / Stairs / Wheelchair Mobility  Ambulation/Gait Ambulation/Gait assistance: Contact guard assist Gait Distance (Feet): 4 Feet Assistive device: Rolling walker (2 wheels) Gait Pattern/deviations: Step-to pattern, Antalgic General Gait Details: pt endorses throbbing in standing and only willing to ambulate from recliner back to EOB. RN gave pain meds while Thereasa Parkin was in room. Gait velocity: decreased Wheelchair Mobility Wheelchair mobility: Yes Wheelchair propulsion: Both upper extremities Wheelchair parts: Needs assistance Distance: 140 Wheelchair Assistance Details (indicate cue type and reason): max verbal cues for technique (pivots, propulsion, retro etc). max assist needed to manage WC parts (brakes, arm rest)    Posture / Balance Dynamic Sitting Balance Sitting balance - Comments: steady static sitting, reaching inside BOS. Balance Overall balance assessment: Needs assistance Sitting-balance support: Feet supported Sitting balance-Leahy Scale: Good Sitting balance - Comments: steady static sitting, reaching inside BOS. Standing balance support: Bilateral upper extremity supported, During functional activity, Reliant on assistive device for balance Standing balance-Leahy Scale: Fair Standing balance comment: assistance needed to doff/don mesh underwear    Special needs/care consideration Skin BKA on R and Diabetic management yes   Previous  Home Environment (from acute therapy documentation) Living Arrangements: Spouse/significant other, Children Available Help at Discharge: Family Type of Home: House Home Layout: One level Home Access: Stairs to enter Entrance Stairs-Rails: Right Entrance Stairs-Number of Steps: 4-5 Bathroom Shower/Tub: Associate Professor: No Home Care Services: No  Discharge Living Setting Plans for Discharge Living Setting: Patient's home, Lives with (comment) (spouse and daughter) Type of Home at Discharge: House Discharge Home Layout: One level Discharge Home Access: Stairs to enter Entrance Stairs-Rails: Left Entrance Stairs-Number of Steps: 4-5 Discharge Bathroom Shower/Tub: Tub/shower unit Discharge Bathroom Toilet: Standard Discharge Bathroom Accessibility: No Does the patient have any problems obtaining your medications?: No  Social/Family/Support Systems Patient Roles: Spouse,  Parent Contact Information: daughter is 28 Anticipated Caregiver: mod I goals, but spouse Asher Muir is primary contact Anticipated Caregiver's Contact Information: [phone] Ability/Limitations of Caregiver: works M-F until 530 Caregiver Availability: Evenings only Discharge Plan Discussed with Primary Caregiver: Yes Is Caregiver In Agreement with Plan?: Yes Does Caregiver/Family have Issues with Lodging/Transportation while Pt is in Rehab?: No  Goals Patient/Family Goal for Rehab: PT/OT mod I, SLP n/a Expected length of stay: 9-12 days Additional Information: Discharge plan: discharge back to pt's home at mod I level, spouse works but can provide intermittent assist outside of work hours Pt/Family Agrees to Admission and willing to participate: Yes Program Orientation Provided & Reviewed with Pt/Caregiver Including Roles  & Responsibilities: Yes  Decrease burden of Care through IP rehab admission: n/a  Possible need for SNF placement upon discharge: not  anticipated.  Expected discharge home at mod I level.  Spouse works days.  40 y/o child.   Patient Condition: I have reviewed medical records from [facility], spoken with CM, and patient and spouse. I discussed via phone for inpatient rehabilitation assessment.  Patient will benefit from ongoing PT and OT, can actively participate in 3 hours of therapy a day 5 days of the week, and can make measurable gains during the admission.  Patient will also benefit from the coordinated team approach during an Inpatient Acute Rehabilitation admission.  The patient will receive intensive therapy as well as Rehabilitation physician, nursing, social worker, and care management interventions.  Due to safety, skin/wound care, disease management, medication administration, pain management, and patient education the patient requires 24 hour a day rehabilitation nursing.  The patient is currently min assist with mobility and basic ADLs.  Discharge setting and therapy post discharge at home with home health is anticipated.  Patient has agreed to participate in the Acute Inpatient Rehabilitation Program and will admit today.  Preadmission Screen Completed By:  Stephania Fragmin, PT, DPT 01/23/2023 10:14 AM ______________________________________________________________________   Discussed status with Dr. Natale Lay on 01/23/23  at 10:14 AM  and received approval for admission today.  Admission Coordinator:  Stephania Fragmin, PT, DPT time 10:14 AM Dorna Bloom 01/23/23    Assessment/Plan: Diagnosis: R BKA Does the need for close, 24 hr/day Medical supervision in concert with the patient's rehab needs make it unreasonable for this patient to be served in a less intensive setting? Yes Co-Morbidities requiring supervision/potential complications: PVD, A-fib, chronic iron deficiency anemia, diabetes mellitus with hyperglycemia, constipation, diabetic neuropathy, hypertension, AKI, hyperkalemia, obesity Due to bladder management, bowel  management, safety, skin/wound care, disease management, medication administration, pain management, and patient education, does the patient require 24 hr/day rehab nursing? Yes Does the patient require coordinated care of a physician, rehab nurse, PT, OT, and SLP to address physical and functional deficits in the context of the above medical diagnosis(es)? Yes Addressing deficits in the following areas: balance, endurance, locomotion, strength, transferring, bowel/bladder control, bathing, dressing, feeding, grooming, toileting, and psychosocial support Can the patient actively participate in an intensive therapy program of at least 3 hrs of therapy 5 days a week? Yes The potential for patient to make measurable gains while on inpatient rehab is excellent Anticipated functional outcomes upon discharge from inpatient rehab: modified independent PT, modified independent OT, n/a SLP Estimated rehab length of stay to reach the above functional goals is: 10-12 days Anticipated discharge destination: Home 10. Overall Rehab/Functional Prognosis: excellent   MD Signature: Fanny Dance

## 2023-01-23 ENCOUNTER — Inpatient Hospital Stay: Payer: Medicaid Other

## 2023-01-23 ENCOUNTER — Encounter (HOSPITAL_COMMUNITY): Payer: Self-pay | Admitting: Physical Medicine and Rehabilitation

## 2023-01-23 ENCOUNTER — Inpatient Hospital Stay (HOSPITAL_COMMUNITY)
Admission: AD | Admit: 2023-01-23 | Discharge: 2023-02-05 | DRG: 560 | Disposition: A | Discharge status: Other Acute Inpatient Hospital | Payer: Medicaid Other | Source: Intra-hospital | Attending: Physical Medicine and Rehabilitation | Admitting: Physical Medicine and Rehabilitation

## 2023-01-23 ENCOUNTER — Encounter: Payer: Self-pay | Admitting: Internal Medicine

## 2023-01-23 DIAGNOSIS — E1142 Type 2 diabetes mellitus with diabetic polyneuropathy: Secondary | ICD-10-CM | POA: Diagnosis not present

## 2023-01-23 DIAGNOSIS — E875 Hyperkalemia: Secondary | ICD-10-CM | POA: Diagnosis not present

## 2023-01-23 DIAGNOSIS — E1022 Type 1 diabetes mellitus with diabetic chronic kidney disease: Secondary | ICD-10-CM | POA: Diagnosis present

## 2023-01-23 DIAGNOSIS — G4733 Obstructive sleep apnea (adult) (pediatric): Secondary | ICD-10-CM | POA: Diagnosis present

## 2023-01-23 DIAGNOSIS — Z7901 Long term (current) use of anticoagulants: Secondary | ICD-10-CM | POA: Diagnosis not present

## 2023-01-23 DIAGNOSIS — I129 Hypertensive chronic kidney disease with stage 1 through stage 4 chronic kidney disease, or unspecified chronic kidney disease: Secondary | ICD-10-CM | POA: Diagnosis present

## 2023-01-23 DIAGNOSIS — N189 Chronic kidney disease, unspecified: Secondary | ICD-10-CM | POA: Diagnosis present

## 2023-01-23 DIAGNOSIS — Z833 Family history of diabetes mellitus: Secondary | ICD-10-CM

## 2023-01-23 DIAGNOSIS — D509 Iron deficiency anemia, unspecified: Secondary | ICD-10-CM | POA: Diagnosis present

## 2023-01-23 DIAGNOSIS — N921 Excessive and frequent menstruation with irregular cycle: Secondary | ICD-10-CM | POA: Diagnosis not present

## 2023-01-23 DIAGNOSIS — I998 Other disorder of circulatory system: Secondary | ICD-10-CM | POA: Diagnosis present

## 2023-01-23 DIAGNOSIS — S88111A Complete traumatic amputation at level between knee and ankle, right lower leg, initial encounter: Secondary | ICD-10-CM | POA: Diagnosis not present

## 2023-01-23 DIAGNOSIS — Z886 Allergy status to analgesic agent status: Secondary | ICD-10-CM

## 2023-01-23 DIAGNOSIS — S88112A Complete traumatic amputation at level between knee and ankle, left lower leg, initial encounter: Secondary | ICD-10-CM | POA: Diagnosis not present

## 2023-01-23 DIAGNOSIS — Z4781 Encounter for orthopedic aftercare following surgical amputation: Principal | ICD-10-CM

## 2023-01-23 DIAGNOSIS — I480 Paroxysmal atrial fibrillation: Secondary | ICD-10-CM | POA: Diagnosis present

## 2023-01-23 DIAGNOSIS — Z8249 Family history of ischemic heart disease and other diseases of the circulatory system: Secondary | ICD-10-CM | POA: Diagnosis not present

## 2023-01-23 DIAGNOSIS — S88112D Complete traumatic amputation at level between knee and ankle, left lower leg, subsequent encounter: Secondary | ICD-10-CM | POA: Diagnosis not present

## 2023-01-23 DIAGNOSIS — E785 Hyperlipidemia, unspecified: Secondary | ICD-10-CM | POA: Diagnosis not present

## 2023-01-23 DIAGNOSIS — I4891 Unspecified atrial fibrillation: Secondary | ICD-10-CM | POA: Diagnosis not present

## 2023-01-23 DIAGNOSIS — F17210 Nicotine dependence, cigarettes, uncomplicated: Secondary | ICD-10-CM | POA: Diagnosis present

## 2023-01-23 DIAGNOSIS — T8789 Other complications of amputation stump: Secondary | ICD-10-CM | POA: Diagnosis not present

## 2023-01-23 DIAGNOSIS — I10 Essential (primary) hypertension: Secondary | ICD-10-CM | POA: Diagnosis not present

## 2023-01-23 DIAGNOSIS — D259 Leiomyoma of uterus, unspecified: Secondary | ICD-10-CM | POA: Diagnosis not present

## 2023-01-23 DIAGNOSIS — S78111A Complete traumatic amputation at level between right hip and knee, initial encounter: Secondary | ICD-10-CM | POA: Diagnosis not present

## 2023-01-23 DIAGNOSIS — G546 Phantom limb syndrome with pain: Secondary | ICD-10-CM | POA: Diagnosis present

## 2023-01-23 DIAGNOSIS — E1051 Type 1 diabetes mellitus with diabetic peripheral angiopathy without gangrene: Secondary | ICD-10-CM | POA: Diagnosis present

## 2023-01-23 DIAGNOSIS — S88112S Complete traumatic amputation at level between knee and ankle, left lower leg, sequela: Secondary | ICD-10-CM | POA: Diagnosis not present

## 2023-01-23 DIAGNOSIS — D62 Acute posthemorrhagic anemia: Secondary | ICD-10-CM | POA: Diagnosis present

## 2023-01-23 DIAGNOSIS — Z885 Allergy status to narcotic agent status: Secondary | ICD-10-CM

## 2023-01-23 DIAGNOSIS — Z79899 Other long term (current) drug therapy: Secondary | ICD-10-CM

## 2023-01-23 DIAGNOSIS — N179 Acute kidney failure, unspecified: Secondary | ICD-10-CM | POA: Diagnosis not present

## 2023-01-23 DIAGNOSIS — Z89511 Acquired absence of right leg below knee: Secondary | ICD-10-CM | POA: Diagnosis not present

## 2023-01-23 DIAGNOSIS — E669 Obesity, unspecified: Secondary | ICD-10-CM | POA: Diagnosis present

## 2023-01-23 DIAGNOSIS — K5900 Constipation, unspecified: Secondary | ICD-10-CM | POA: Diagnosis present

## 2023-01-23 DIAGNOSIS — S81001A Unspecified open wound, right knee, initial encounter: Secondary | ICD-10-CM | POA: Diagnosis not present

## 2023-01-23 DIAGNOSIS — E11649 Type 2 diabetes mellitus with hypoglycemia without coma: Secondary | ICD-10-CM | POA: Diagnosis not present

## 2023-01-23 DIAGNOSIS — Z794 Long term (current) use of insulin: Secondary | ICD-10-CM | POA: Diagnosis not present

## 2023-01-23 DIAGNOSIS — G8918 Other acute postprocedural pain: Secondary | ICD-10-CM | POA: Diagnosis not present

## 2023-01-23 DIAGNOSIS — Z7189 Other specified counseling: Secondary | ICD-10-CM | POA: Diagnosis not present

## 2023-01-23 DIAGNOSIS — N1832 Chronic kidney disease, stage 3b: Secondary | ICD-10-CM | POA: Diagnosis not present

## 2023-01-23 DIAGNOSIS — E871 Hypo-osmolality and hyponatremia: Secondary | ICD-10-CM | POA: Diagnosis not present

## 2023-01-23 DIAGNOSIS — N920 Excessive and frequent menstruation with regular cycle: Secondary | ICD-10-CM | POA: Diagnosis not present

## 2023-01-23 DIAGNOSIS — Z888 Allergy status to other drugs, medicaments and biological substances status: Secondary | ICD-10-CM

## 2023-01-23 DIAGNOSIS — M79604 Pain in right leg: Secondary | ICD-10-CM | POA: Diagnosis not present

## 2023-01-23 DIAGNOSIS — Z6834 Body mass index (BMI) 34.0-34.9, adult: Secondary | ICD-10-CM | POA: Diagnosis not present

## 2023-01-23 DIAGNOSIS — E1165 Type 2 diabetes mellitus with hyperglycemia: Secondary | ICD-10-CM | POA: Diagnosis present

## 2023-01-23 DIAGNOSIS — D649 Anemia, unspecified: Secondary | ICD-10-CM | POA: Diagnosis not present

## 2023-01-23 DIAGNOSIS — G4730 Sleep apnea, unspecified: Secondary | ICD-10-CM | POA: Diagnosis not present

## 2023-01-23 DIAGNOSIS — R739 Hyperglycemia, unspecified: Secondary | ICD-10-CM | POA: Diagnosis not present

## 2023-01-23 DIAGNOSIS — E1059 Type 1 diabetes mellitus with other circulatory complications: Secondary | ICD-10-CM

## 2023-01-23 DIAGNOSIS — K5901 Slow transit constipation: Secondary | ICD-10-CM | POA: Diagnosis not present

## 2023-01-23 DIAGNOSIS — Z9189 Other specified personal risk factors, not elsewhere classified: Secondary | ICD-10-CM | POA: Diagnosis not present

## 2023-01-23 DIAGNOSIS — N1831 Chronic kidney disease, stage 3a: Secondary | ICD-10-CM | POA: Diagnosis present

## 2023-01-23 DIAGNOSIS — Z515 Encounter for palliative care: Secondary | ICD-10-CM | POA: Diagnosis not present

## 2023-01-23 LAB — CBC
HCT: 27.6 % — ABNORMAL LOW (ref 36.0–46.0)
Hemoglobin: 8 g/dL — ABNORMAL LOW (ref 12.0–15.0)
MCH: 20.1 pg — ABNORMAL LOW (ref 26.0–34.0)
MCHC: 29 g/dL — ABNORMAL LOW (ref 30.0–36.0)
MCV: 69.3 fL — ABNORMAL LOW (ref 80.0–100.0)
Platelets: 275 10*3/uL (ref 150–400)
RBC: 3.98 MIL/uL (ref 3.87–5.11)
RDW: 26.2 % — ABNORMAL HIGH (ref 11.5–15.5)
WBC: 13.3 10*3/uL — ABNORMAL HIGH (ref 4.0–10.5)
nRBC: 0 % (ref 0.0–0.2)

## 2023-01-23 LAB — HEPATITIS PANEL, ACUTE
HCV Ab: NONREACTIVE
Hep A IgM: NONREACTIVE
Hep B C IgM: NONREACTIVE
Hepatitis B Surface Ag: NONREACTIVE

## 2023-01-23 LAB — BASIC METABOLIC PANEL
Anion gap: 11 (ref 5–15)
BUN: 22 mg/dL — ABNORMAL HIGH (ref 6–20)
CO2: 23 mmol/L (ref 22–32)
Calcium: 8.9 mg/dL (ref 8.9–10.3)
Chloride: 100 mmol/L (ref 98–111)
Creatinine, Ser: 1.11 mg/dL — ABNORMAL HIGH (ref 0.44–1.00)
GFR, Estimated: >60 mL/min (ref >60)
Glucose, Bld: 125 mg/dL — ABNORMAL HIGH (ref 70–99)
Potassium: 4.8 mmol/L (ref 3.5–5.1)
Sodium: 134 mmol/L — ABNORMAL LOW (ref 135–145)

## 2023-01-23 LAB — HCG, QUANTITATIVE, PREGNANCY: hCG, Beta Chain, Quant, S: 1 m[IU]/mL (ref <5)

## 2023-01-23 LAB — GLUCOSE, CAPILLARY
Glucose-Capillary: 113 mg/dL — ABNORMAL HIGH (ref 70–99)
Glucose-Capillary: 125 mg/dL — ABNORMAL HIGH (ref 70–99)
Glucose-Capillary: 151 mg/dL — ABNORMAL HIGH (ref 70–99)
Glucose-Capillary: 184 mg/dL — ABNORMAL HIGH (ref 70–99)
Glucose-Capillary: 71 mg/dL (ref 70–99)
Glucose-Capillary: 73 mg/dL (ref 70–99)

## 2023-01-23 LAB — SEDIMENTATION RATE: Sed Rate: 129 mm/h — ABNORMAL HIGH (ref 0–20)

## 2023-01-23 LAB — SURGICAL PATHOLOGY

## 2023-01-23 LAB — C-REACTIVE PROTEIN: CRP: 25.6 mg/dL — ABNORMAL HIGH (ref <1.0)

## 2023-01-23 LAB — HEMOGLOBIN A1C
Hgb A1c MFr Bld: 9.2 % — ABNORMAL HIGH (ref 4.8–5.6)
Mean Plasma Glucose: 217.34 mg/dL

## 2023-01-23 MED ORDER — HYDROCODONE-ACETAMINOPHEN 5-325 MG PO TABS
2.0000 | ORAL_TABLET | Freq: Four times a day (QID) | ORAL | Status: DC | PRN
  Administered 2023-01-23 – 2023-01-29 (×18): 2 via ORAL
  Filled 2023-01-23 (×19): qty 2

## 2023-01-23 MED ORDER — POLYETHYLENE GLYCOL 3350 17 G PO PACK
17.0000 g | PACK | Freq: Every day | ORAL | Status: DC
  Administered 2023-01-24 – 2023-01-30 (×7): 17 g via ORAL
  Filled 2023-01-23 (×13): qty 1

## 2023-01-23 MED ORDER — DIPHENHYDRAMINE HCL 25 MG PO CAPS: 25.0000 mg | ORAL_CAPSULE | Freq: Four times a day (QID) | ORAL | Status: DC | PRN

## 2023-01-23 MED ORDER — POLYSACCHARIDE IRON COMPLEX 150 MG PO CAPS
150.0000 mg | ORAL_CAPSULE | Freq: Two times a day (BID) | ORAL | Status: DC
  Administered 2023-01-23 – 2023-02-04 (×25): 150 mg via ORAL
  Filled 2023-01-23 (×25): qty 1

## 2023-01-23 MED ORDER — ACETAMINOPHEN 325 MG PO TABS
325.0000 mg | ORAL_TABLET | ORAL | Status: DC | PRN
  Administered 2023-01-24 – 2023-01-29 (×4): 650 mg via ORAL
  Administered 2023-01-29: 325 mg via ORAL
  Administered 2023-01-29: 650 mg via ORAL
  Administered 2023-01-30: 325 mg via ORAL
  Filled 2023-01-23 (×7): qty 2

## 2023-01-23 MED ORDER — INSULIN ASPART 100 UNIT/ML IJ SOLN: 0.0000 [IU] | Freq: Every day | INTRAMUSCULAR | Status: DC

## 2023-01-23 MED ORDER — GABAPENTIN 400 MG PO CAPS
400.0000 mg | ORAL_CAPSULE | Freq: Three times a day (TID) | ORAL | Status: DC
  Administered 2023-01-23 – 2023-01-24 (×6): 400 mg via ORAL
  Filled 2023-01-23 (×6): qty 1

## 2023-01-23 MED ORDER — TRAZODONE HCL 50 MG PO TABS
25.0000 mg | ORAL_TABLET | Freq: Every evening | ORAL | Status: DC | PRN
Start: 2023-01-23 — End: 2023-02-05
  Administered 2023-01-30: 25 mg via ORAL
  Filled 2023-01-23 (×2): qty 1

## 2023-01-23 MED ORDER — PROCHLORPERAZINE 25 MG RE SUPP: 12.5000 mg | Freq: Four times a day (QID) | RECTAL | Status: DC | PRN

## 2023-01-23 MED ORDER — SENNA 8.6 MG PO TABS
2.0000 | ORAL_TABLET | Freq: Every day | ORAL | Status: DC
  Administered 2023-01-24 – 2023-01-28 (×5): 17.2 mg via ORAL
  Filled 2023-01-23 (×5): qty 2

## 2023-01-23 MED ORDER — METHOCARBAMOL 500 MG PO TABS
1000.0000 mg | ORAL_TABLET | Freq: Three times a day (TID) | ORAL | Status: DC
  Administered 2023-01-23 – 2023-01-24 (×2): 1000 mg via ORAL
  Filled 2023-01-23 (×2): qty 2

## 2023-01-23 MED ORDER — INSULIN ASPART 100 UNIT/ML IJ SOLN: 15.0000 [IU] | Freq: Three times a day (TID) | INTRAMUSCULAR | Status: DC

## 2023-01-23 MED ORDER — METHOCARBAMOL 1000 MG PO TABS: 1000.0000 mg | ORAL_TABLET | Freq: Three times a day (TID) | ORAL | Status: DC

## 2023-01-23 MED ORDER — PROCHLORPERAZINE MALEATE 5 MG PO TABS
5.0000 mg | ORAL_TABLET | Freq: Four times a day (QID) | ORAL | Status: DC | PRN
Start: 2023-01-23 — End: 2023-02-05

## 2023-01-23 MED ORDER — NICOTINE 21 MG/24HR TD PT24
21.0000 mg | MEDICATED_PATCH | Freq: Every day | TRANSDERMAL | Status: DC
  Filled 2023-01-23 (×4): qty 1

## 2023-01-23 MED ORDER — APIXABAN 5 MG PO TABS
5.0000 mg | ORAL_TABLET | Freq: Two times a day (BID) | ORAL | Status: DC
  Administered 2023-01-23 – 2023-01-29 (×12): 5 mg via ORAL
  Filled 2023-01-23 (×12): qty 1

## 2023-01-23 MED ORDER — POLYSACCHARIDE IRON COMPLEX 150 MG PO CAPS: 150.0000 mg | ORAL_CAPSULE | Freq: Two times a day (BID) | ORAL | Status: DC

## 2023-01-23 MED ORDER — TRAZODONE HCL 50 MG PO TABS
100.0000 mg | ORAL_TABLET | Freq: Every day | ORAL | Status: DC
  Administered 2023-01-23 – 2023-02-04 (×13): 100 mg via ORAL
  Filled 2023-01-23 (×14): qty 2

## 2023-01-23 MED ORDER — HYDROMORPHONE HCL 2 MG PO TABS: 2.0000 mg | ORAL_TABLET | ORAL | Status: DC | PRN

## 2023-01-23 MED ORDER — GUAIFENESIN-DM 100-10 MG/5ML PO SYRP: 5.0000 mL | ORAL_SOLUTION | Freq: Four times a day (QID) | ORAL | Status: DC | PRN

## 2023-01-23 MED ORDER — INSULIN GLARGINE-YFGN 100 UNIT/ML ~~LOC~~ SOLN: 40.0000 [IU] | Freq: Two times a day (BID) | SUBCUTANEOUS | Status: DC

## 2023-01-23 MED ORDER — BISACODYL 10 MG RE SUPP: 10.0000 mg | Freq: Every day | RECTAL | Status: DC | PRN

## 2023-01-23 MED ORDER — ALUM & MAG HYDROXIDE-SIMETH 200-200-20 MG/5ML PO SUSP: 30.0000 mL | ORAL | Status: DC | PRN

## 2023-01-23 MED ORDER — DILTIAZEM HCL ER COATED BEADS 180 MG PO CP24
180.0000 mg | ORAL_CAPSULE | Freq: Every day | ORAL | Status: DC
Start: 2023-01-24 — End: 2023-02-05
  Administered 2023-01-24 – 2023-02-05 (×13): 180 mg via ORAL
  Filled 2023-01-23 (×13): qty 1

## 2023-01-23 MED ORDER — HYDROMORPHONE HCL 2 MG PO TABS
2.0000 mg | ORAL_TABLET | ORAL | Status: DC | PRN
  Administered 2023-01-24 (×2): 2 mg via ORAL
  Filled 2023-01-23 (×2): qty 1

## 2023-01-23 MED ORDER — HYDROCODONE-ACETAMINOPHEN 5-325 MG PO TABS: 2.0000 | ORAL_TABLET | Freq: Four times a day (QID) | ORAL | Status: DC | PRN

## 2023-01-23 MED ORDER — CYCLOBENZAPRINE HCL 5 MG PO TABS
7.5000 mg | ORAL_TABLET | Freq: Three times a day (TID) | ORAL | Status: DC | PRN
  Administered 2023-01-23: 7.5 mg via ORAL
  Filled 2023-01-23: qty 2

## 2023-01-23 MED ORDER — APIXABAN 5 MG PO TABS: 5.0000 mg | ORAL_TABLET | Freq: Two times a day (BID) | ORAL | Status: DC

## 2023-01-23 MED ORDER — ONDANSETRON 4 MG PO TBDP: 4.0000 mg | ORAL_TABLET | Freq: Four times a day (QID) | ORAL | Status: DC | PRN

## 2023-01-23 MED ORDER — GABAPENTIN 400 MG PO CAPS: 400.0000 mg | ORAL_CAPSULE | Freq: Three times a day (TID) | ORAL | Status: DC

## 2023-01-23 MED ORDER — INSULIN GLARGINE-YFGN 100 UNIT/ML ~~LOC~~ SOLN
40.0000 [IU] | Freq: Two times a day (BID) | SUBCUTANEOUS | Status: DC
  Administered 2023-01-23 – 2023-02-03 (×22): 40 [IU] via SUBCUTANEOUS
  Filled 2023-01-23 (×24): qty 0.4

## 2023-01-23 MED ORDER — INSULIN ASPART 100 UNIT/ML IJ SOLN
15.0000 [IU] | Freq: Two times a day (BID) | INTRAMUSCULAR | Status: DC
Start: 2023-01-24 — End: 2023-01-25
  Administered 2023-01-24: 15 [IU] via SUBCUTANEOUS

## 2023-01-23 MED ORDER — PROCHLORPERAZINE EDISYLATE 10 MG/2ML IJ SOLN: 5.0000 mg | Freq: Four times a day (QID) | INTRAMUSCULAR | Status: DC | PRN

## 2023-01-23 MED ORDER — FLEET ENEMA RE ENEM: 1.0000 | ENEMA | Freq: Once | RECTAL | Status: DC | PRN

## 2023-01-23 MED ORDER — INSULIN ASPART 100 UNIT/ML IJ SOLN
0.0000 [IU] | Freq: Three times a day (TID) | INTRAMUSCULAR | Status: DC
  Administered 2023-01-24: 1 [IU] via SUBCUTANEOUS
  Administered 2023-01-25 – 2023-01-26 (×3): 2 [IU] via SUBCUTANEOUS

## 2023-01-23 MED ORDER — INSULIN ASPART 100 UNIT/ML IJ SOLN: 10.0000 [IU] | Freq: Three times a day (TID) | INTRAMUSCULAR | Status: DC

## 2023-01-23 NOTE — Progress Notes (Signed)
[name] transported patient. Report given and care transferred to [facility].  [name] Jennifer [name]

## 2023-01-23 NOTE — TOC Transition Note (Signed)
Transition of Care [facility]) - Discharge Note   Patient Details  Name: Jennifer [name] MRN: [id number] Date of Birth: 13-Mar-1980  [facility] of [facility]) CM/SW Contact:  Chapman Fitch, RN Phone Number: 01/23/2023, 11:28 AM   Clinical Narrative:      Notified patient to discharge today to CIR Adrienne Mocha with CIR facilitating [name] packet on chart        Patient Goals and CMS Choice            Discharge Placement                       Discharge Plan and Services Additional resources added to the After Visit Summary for                                       Social Drivers of Health (SDOH) Interventions SDOH Screenings   Food Insecurity: No Food Insecurity (01/17/2023)  Housing: Unknown (01/17/2023)  Transportation Needs: No Transportation Needs (01/17/2023)  Utilities: Not At Risk (01/17/2023)  Tobacco Use: High Risk (01/15/2023)     Readmission Risk Interventions     No data to display

## 2023-01-23 NOTE — Progress Notes (Signed)
Patient ID: Jennifer [name], female   DOB: November 11, 1980, 42 y.o.   MRN: [id number] Met with the patient to review current medical situation, rehab schedule, team conference and plan of care. Patient with hx of T1DM, taking insulin three times/day PTA; A!C 9.5.  Reviewed CMM diet and CBG checks with  secondary risk management including HTN  , A-fib and smoking cessation.  Hx of fibroids; menses on; on Eliquis.  Staples intact to incision with bruising, edema and blister noted around incision with small amount of serosanguinous drainage right lateral side of incision. Reviewed dressing changes and positioning of residual limb and safety.  Continue to follow along to address educational needs to facilitate preparation for discharge. Pamelia Hoit

## 2023-01-23 NOTE — H&P (Addendum)
Physical Medicine and Rehabilitation Admission H&P    Chief Complaint  Patient presents with   Functional deficits due to BKA    HPI: Jennifer [name] is a 42 year old female with history of T1DM, HTN, A flutter, ongoing tobacco use who was admitted on [facility] on 01/15/23 with 2 day history of RLE pain with coldness, numbness and difficulty walking. She was found to have ischemic RLE, lack of DP/PT on dopplers, was anemic with hemoglobin 7.5 and BS 602 at admission. She was started on IV heparin and  taken to OR for aortogram with mechanical thrombectomy to right anterior tibial artery and stent placement and right popliteal artery on the same day by Dr. Wyn Quaker.  She  had drop in hemoglobin to 5.9 and was transfused with 2 units PRBC.  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 Wallace Cullens.  Postop pain continue to be an issue.  She was tried on lyrica but then this was changed back to gabapentin. Phantom pain is under control but she continues to have pain at residual limb. She required additional units PRBC on 12/14 with most recent CBC revealing H&H at 8.0 and 27.6.  Review of Systems  Constitutional:  Negative for chills and fever.  HENT:  Negative for congestion and hearing loss.   Eyes:  Negative for blurred vision and double vision.  Respiratory:  Negative for cough and shortness of breath.   Cardiovascular:  Negative for chest pain and palpitations.  Gastrointestinal:  Negative for constipation and heartburn.  Genitourinary:  Negative for dysuria and urgency.       Vaginal bleeding with clots for past 3 days.   Musculoskeletal:  Positive for joint pain and myalgias.  Skin:  Negative for rash.  Neurological:  Positive for sensory change and weakness. Negative for dizziness.  Psychiatric/Behavioral:  The patient is nervous/anxious and has insomnia.      Past Medical History:  Diagnosis Date   Abnormal uterine bleeding    Due to  fibriods/required transfusions   Atrial fibrillation with RVR (HCC) 05/2022   underwent cardioversion by Dr. Mariah Milling   Closed fracture of coronoid process of right ulna 2011   Diabetes mellitus without complication (HCC)    Headache    Hypertension    Menorrhagia    OSA (obstructive sleep apnea)     Past Surgical History:  Procedure Laterality Date   AMPUTATION Right 01/17/2023   Procedure: AMPUTATION BELOW KNEE;  Surgeon: Iline Oven, MD;  Location: [facility] ORS;  Service: Vascular;  Laterality: Right;   CESAREAN SECTION     x2   LOWER EXTREMITY ANGIOGRAPHY Right 01/15/2023   Procedure: Lower Extremity Angiography;  Surgeon: Annice Needy, MD;  Location: [facility] INVASIVE CV LAB;  Service: Cardiovascular;  Laterality: Right;   none      Family History  Adopted: Yes  Problem Relation Age of Onset   Diabetes Other    Hypertension Other     Social History:  Married. Independent and working a year ago. Was working on getting disability PTA. She  reports that she has been smoking--1.5 PPD. She has never used smokeless tobacco. She reports that she does not drink alcohol and does not use drugs.   Allergies  Allergen Reactions   Demerol [Meperidine] Hives, Itching and Nausea And Vomiting   Meloxicam Nausea And Vomiting   Morphine Other (See Comments)    Other Reaction: tolerates hydromorphone  Oxycodone Hives, Itching and Nausea And Vomiting   Toradol [Ketorolac Tromethamine] Hives, Itching and Nausea And Vomiting    Medications Prior to Admission  Medication Sig Dispense Refill   apixaban (ELIQUIS) 5 MG TABS tablet Take 1 tablet (5 mg total) by mouth 2 (two) times daily.     atorvastatin (LIPITOR) 40 MG tablet Take 0.5 tablets (20 mg total) by mouth daily. (Patient not taking: Reported on 01/15/2023) 30 tablet 0   DEPO-PROVERA 150 MG/ML injection Inject 150 mg into the muscle every 3 (three) months.     diltiazem (CARDIZEM CD) 180 MG 24 hr capsule Take 1 capsule (180 mg total) by  mouth daily. (Patient not taking: Reported on 01/15/2023) 30 capsule 1   ferrous sulfate 325 (65 FE) MG EC tablet Take 1 tablet (325 mg total) by mouth 2 (two) times daily. 60 tablet 3   gabapentin (NEURONTIN) 400 MG capsule Take 1 capsule (400 mg total) by mouth 3 (three) times daily.     HYDROcodone-acetaminophen (NORCO/VICODIN) 5-325 MG tablet Take 2 tablets by mouth every 6 (six) hours as needed for moderate pain (pain score 4-6).     HYDROmorphone (DILAUDID) 2 MG tablet Take 1 tablet (2 mg total) by mouth every 4 (four) hours as needed for severe pain (pain score 7-10).     insulin aspart (NOVOLOG) 100 UNIT/ML injection Inject 10 Units into the skin 3 (three) times daily with meals.     insulin glargine-yfgn (SEMGLEE) 100 UNIT/ML injection Inject 0.4 mLs (40 Units total) into the skin 2 (two) times daily.     methocarbamol 1000 MG TABS Take 1,000 mg by mouth 3 (three) times daily.     nicotine (NICODERM CQ - DOSED IN MG/24 HOURS) 21 mg/24hr patch Place 1 patch (21 mg total) onto the skin daily. (Patient not taking: Reported on 01/15/2023) 28 patch 0   ondansetron (ZOFRAN-ODT) 4 MG disintegrating tablet Take 1 tablet (4 mg total) by mouth every 6 (six) hours as needed for nausea or vomiting.      Home: Home: Home Living Family/patient expects to be discharged to:: Private residence Living Arrangements: Spouse/significant other, Children Available Help at Discharge: Family Type of Home: House Home Access: Stairs to enter Secretary/administrator of Steps: 4-5 Entrance Stairs-Rails: Right Home Layout: One level Bathroom Shower/Tub: Engineer, manufacturing systems: Standard Bathroom Accessibility: No Home Equipment: Crutches   Functional History: Prior Function Prior Level of Function : Independent/Modified Independent Mobility Comments: Independent without AE PTA, denies falls history. ADLs Comments: Independent, caring for family, eager to get back to PLOF   Functional Status:   Mobility: Bed Mobility Overal bed mobility: Needs Assistance Bed Mobility: Supine to Sit Supine to sit: Min assist Sit to supine: Min assist General bed mobility comments: Pt was in recliner pre/post session Transfers Overall transfer level: Needs assistance Equipment used: Rolling walker (2 wheels) Transfers: Sit to/from Stand Sit to Stand: Min assist Bed to/from chair/wheelchair/BSC transfer type:: Squat pivot Stand pivot transfers: Contact guard assist Squat pivot transfers: Contact guard assist Step pivot transfers: Min assist General transfer comment: pt deferred citing fatigue from recent PT session Ambulation/Gait Ambulation/Gait assistance: Contact guard assist Gait Distance (Feet): 4 Feet Assistive device: Rolling walker (2 wheels) Gait Pattern/deviations: Step-to pattern, Antalgic General Gait Details: pt endorses throbbing in standing and only willing to ambulate from recliner back to EOB. RN gave pain meds while Thereasa Parkin was in room. Gait velocity: decreased Wheelchair Mobility Wheelchair mobility: Yes Wheelchair propulsion: Both upper extremities Wheelchair parts: Needs  assistance Distance: 140 Wheelchair Assistance Details (indicate cue type and reason): max verbal cues for technique (pivots, propulsion, retro etc). max assist needed to manage WC parts (brakes, arm rest)   ADL: ADL Overall ADL's : Needs assistance/impaired Functional mobility during ADLs: Contact guard assist, Cueing for safety, Rolling walker (2 wheels) General ADL Comments: SETUP seated grooming tasks   Cognition: Cognition Overall Cognitive Status: Within Functional Limits for tasks assessed Orientation Level: Oriented X4 Cognition Arousal: Alert Behavior During Therapy: WFL for tasks assessed/performed Overall Cognitive Status: Within Functional Limits for tasks assessed General Comments: reports feeling depressed    Blood pressure (!) 142/84, pulse 94, temperature 98.2 F (36.8 C),  temperature source Oral, resp. rate 17, height 5\' 7"  (1.702 m), weight 84.5 kg, SpO2 98%. Physical Exam   General: Alert and oriented x 3, No apparent distress HEENT: Head is normocephalic, atraumatic, PERRLA, EOMI, sclera anicteric, oral mucosa pink and moist Neck: Supple without JVD or lymphadenopathy Heart: Reg rate and rhythm. No murmurs rubs or gallops Chest: CTA bilaterally without wheezes, rales, or rhonchi; no distress Abdomen: Soft, non-tender, non-distended, bowel sounds positive. Extremities: No clubbing, cyanosis, or edema. Pulses are 2+ Psych: Pt's affect is appropriate. Pt is cooperative Skin: Clean dry dressing in place R BKA Neuro:  Alert and awake, follows commands, CN 2-12 intact, normal speech and language   RUE: 5/5 Deltoid, 5/5 Biceps, 5/5 Triceps, 5/5 Wrist Ext, 5/5 Grip LUE: 5/5 Deltoid, 5/5 Biceps, 5/5 Triceps, 5/5 Wrist Ext, 5/5 Grip RLE:  HF 3/5, KE - limited by pain LLE: HF 5/5, KE 5/5, HF, 5/5, ADF 5/5, APF 5/5 Sensation intact to LT in b/l LE and UE   Musculoskeletal:  No joint swelling or tenderness     Results for orders placed or performed during the hospital encounter of 01/23/23 (from the past 48 hours)  Hemoglobin A1c     Status: Abnormal   Collection Time: 01/23/23  3:27 PM  Result Value Ref Range   Hgb A1c MFr Bld 9.2 (H) 4.8 - 5.6 %    Comment: (NOTE) Pre diabetes:          5.7%-6.4%  Diabetes:              >6.4%  Glycemic control for   <7.0% adults with diabetes    Mean Plasma Glucose 217.34 mg/dL    Comment: Performed at [facility], [address], [place], [place] [address]  Glucose, capillary     Status: None   Collection Time: 01/23/23  4:49 PM  Result Value Ref Range   Glucose-Capillary 73 70 - 99 mg/dL    Comment: Glucose reference range applies only to samples taken after fasting for at least 8 hours.  Glucose, capillary     Status: None   Collection Time: 01/23/23  5:30 PM  Result Value Ref Range    Glucose-Capillary 71 70 - 99 mg/dL    Comment: Glucose reference range applies only to samples taken after fasting for at least 8 hours.   DG Chest Port 1 View Result Date: 01/23/2023 CLINICAL DATA:  Vasculitis. EXAM: PORTABLE CHEST 1 VIEW COMPARISON:  05/26/2022 FINDINGS: The lungs are clear without focal pneumonia, edema, pneumothorax or pleural effusion. The cardiopericardial silhouette is within normal limits for size. No acute bony abnormality. IMPRESSION: No active disease. Electronically Signed   By: Kennith Center M.D.   On: 01/23/2023 14:55      Blood pressure (!) 142/84, pulse 94, temperature 98.2 F (36.8 C), temperature  source Oral, resp. rate 17, height 5\' 7"  (1.702 m), weight 84.5 kg, SpO2 98%.  Medical Problem List and Plan: 1. Functional deficits secondary to R BKA  -patient may shower if RLE covered  -ELOS/Goals: 9-12 days, PT/OT mod I  -Admit to CIR 2.  Antithrombotics: -DVT/anticoagulation:  Pharmaceutical: Eliquis  -antiplatelet therapy: N/A 3. Pain Management:  Dilaudid 2mg   prn for severe pain,  hydrocodone 10/650 mg prn for severe pain --On robaxin 1000 mg TID-->will add flexeril prn also as reports tightness right thigh ---On  gabapentin 400 mg TID-->increase to QID for neuropathic symptoms 4. Mood/Behavior/Sleep:  LCSW to follow for evaluation and support.   -antipsychotic agents: N/A 5. Neuropsych/cognition: This patient is capable of making decisions on her own behalf. 6. Skin/Wound Care: Routine pressure relief measures.  7. Fluids/Electrolytes/Nutrition: Monitor I/O. Check CMET in am 8.  H/o A flutter/PAF: Monitor HR TID--continue Cardizem and Apixaban  --monitor for symptoms with increase in activity.  9. T1DM: Hgb A1c-9.2 and blood sugars poorly controlled. BS 800 during August hospitalization.   --monitor BS ac/hs/3 am and use SSI for elevated BS. Will change meal coverage to [facility] b'fast/lunch as she cotinues to drop at late afternoons. May drop further  with 3.5 hrs of therapy.  Pt was seen by diabetes coordinator.  --Continue Insulin glargline bid--reports compliance at home.  10. Hyponatremia: Improved from 127 @ admission to 134  --Monitor for now 11. CKD?:  Per chart review SCr-1.56 on 08/24 and  1.33 @ admission.   --Monitor renal status with serial checks. Avoid nephrotoxic medications.  12. Acute on chronic  iron deficiency anemia:  Has been transfused with 3 units PRBCs.   --Add iron due to severe iron deficiency --Continue to monitor H&H for stability.    --hx of menorrhagia/Fibroids-->on depo with good results PTA but has had recurrance w/clots past 3 days.  13. Leukocytosis: Monitor for fevers and other signs of infection. 14. Obesity. BMI 34 on admission. Dietary counseling 15. Constipation. Miralax, senokot. 16. Hyperkalemia. Monitor labs 17. HTN. Lisinopril and hydrochlorothiazide have been stopped.    Jacquelynn Cree, PA-C 01/23/2023   I have personally performed a face to face diagnostic evaluation of this patient and formulated the key components of the plan.  Additionally, I have personally reviewed laboratory data, imaging studies, as well as relevant notes and concur with the physician assistant's documentation above.  The patient's status has not changed from the original H&P.  Any changes in documentation from the acute care chart have been noted above.  Fanny Dance, MD, [place]

## 2023-01-23 NOTE — Discharge Instructions (Signed)
VASCULAR SURGERY DRESSING INSTRUCTIONS:  Remove old dressing  Zeroform guaze over the staple line then  Cover with 4x4's then  Cover with 2 Abdominal pads then   Wrap with kerlex then  Wrap with 6 inch ace bandage snugly to reduce swelling.  Change Dressing Daily until Staples are removed.

## 2023-01-23 NOTE — Progress Notes (Signed)
Inpatient Rehabilitation Admission Medication Review by a Pharmacist  A complete drug regimen review was completed for this patient to identify any potential clinically significant medication issues.  High Risk Drug Classes Is patient taking? Indication by Medication  Antipsychotic Yes Compazine prn N/V  Anticoagulant Yes Apixaban - PAF  Antibiotic No   Opioid Yes Vicodin, Dilaudid - pain  Antiplatelet No   Hypoglycemics/insulin Yes Insulin - DM  Vasoactive Medication Yes Diltiazem - Afib  Chemotherapy No   Other Yes Trazodone - sleep Methocarbamol - spasms Cyclobenzaprine prn spasms Gabapentin - pain Nicotine - smoke cessation     Type of Medication Issue Identified Description of Issue Recommendation(s)  Drug Interaction(s) (clinically significant)     Duplicate Therapy     Allergy     No Medication Administration End Date     Incorrect Dose     Additional Drug Therapy Needed     Significant med changes from prior encounter (inform family/care partners about these prior to discharge).    Other       Clinically significant medication issues were identified that warrant physician communication and completion of prescribed/recommended actions by midnight of the next day:  No  Name of provider notified for urgent issues identified:   Provider Method of Notification:     Pharmacist comments: None  Time spent performing this drug regimen review (minutes):  20 minutes  Thank you Okey Regal, PharmD

## 2023-01-23 NOTE — Progress Notes (Signed)
INPATIENT REHABILITATION ADMISSION NOTE   Arrival Method: EMS     Mental Orientation: Alert   Assessment:done   Skin:done with Gavin Pound   IV'S:done   Pain:10/10 right leg   Tubes and Drains:done   Safety Measures:done   Vital Signs: done  Height and Weight:done   Rehab Orientation:done   Family: Husband and son   Notes:

## 2023-01-23 NOTE — Progress Notes (Deleted)
INPATIENT REHABILITATION ADMISSION NOTE   Arrival Method:EMS     Mental Orientation:alert   Assessment:done   Skin:done with Jennifer Pound RN   IV'S:done   Pain:10/10 addressed meds given   Tubes and Drains: none   Safety Measures: done  Vital Signs:done   Height and Weight:done   Rehab Orientation:done    Family:    Notes:

## 2023-01-23 NOTE — Progress Notes (Signed)
Progress Note    01/23/2023 2:27 PM 6 Days Post-Op  Subjective:  Jennifer [name] is a 42 yo female now POD # 6 from right BKA. Patient complains of 6/10 pain again today. Her pain appears to be getting better.  Patient was counseled this morning again on the exercises needed in her amputated leg to prevent contracture. She is refusing to do them thus precipitating contracture and increased pain. We discussed in detail if her stump becomes contracted and irreversible she will need an above the knee amputation to help resolve the pain. Patient is being treated for pain with multiple medications covering nerve pain and muscle spasms as well as narcotics for surgical pain by the Hospitalist team.    Vitals:   01/23/23 0341 01/23/23 0805  BP: 134/82 (!) 148/82  Pulse: 92 95  Resp: 20 18  Temp: 99.1 F (37.3 C) 98.4 F (36.9 C)  SpO2: 100% 100%   Physical Exam: Cardiac:  RRR, Normal S1, S2. No Murmurs Lungs: Clear on auscultation to without.  Normal labored breathing with no rales rhonchi or wheezing noted. Incisions: Right below the knee amputation.  Dressing changed today at the bedside.  No complications to note.  No hematoma seroma or infection to note.  Staples already intact. Extremities: Right lower extremity BKA.  Dressing changed today at the bedside by vascular surgery.  Left lower extremity warm to touch with palpable pulses. Abdomen: Positive bowel sounds throughout, soft, nontender nondistended. Neurologic: Third and oriented x 3, answers all questions and follows commands appropriately.  CBC    Component Value Date/Time   WBC 13.3 (H) 01/23/2023 0456   RBC 3.98 01/23/2023 0456   HGB 8.0 (L) 01/23/2023 0456   HGB 12.0 06/01/2013 1805   HCT 27.6 (L) 01/23/2023 0456   HCT 39.4 06/01/2013 1805   PLT 275 01/23/2023 0456   PLT 325 06/01/2013 1805   MCV 69.3 (L) 01/23/2023 0456   MCV 71 (L) 06/01/2013 1805   MCH 20.1 (L) 01/23/2023 0456   MCHC 29.0 (L) 01/23/2023 0456    RDW 26.2 (H) 01/23/2023 0456   RDW 18.1 (H) 06/01/2013 1805   LYMPHSABS 2.1 01/15/2023 0834   MONOABS 0.8 01/15/2023 0834   EOSABS 0.3 01/15/2023 0834   BASOSABS 0.0 01/15/2023 0834    BMET    Component Value Date/Time   NA 134 (L) 01/23/2023 0456   NA 135 (L) 06/01/2013 1805   K 4.8 01/23/2023 0456   K 3.6 06/01/2013 1805   CL 100 01/23/2023 0456   CL 103 06/01/2013 1805   CO2 23 01/23/2023 0456   CO2 25 06/01/2013 1805   GLUCOSE 125 (H) 01/23/2023 0456   GLUCOSE 179 (H) 06/01/2013 1805   BUN 22 (H) 01/23/2023 0456   BUN 11 06/01/2013 1805   CREATININE 1.11 (H) 01/23/2023 0456   CREATININE 0.79 06/01/2013 1805   CALCIUM 8.9 01/23/2023 0456   CALCIUM 9.1 06/01/2013 1805   GFRNONAA >60 01/23/2023 0456   GFRNONAA >60 06/01/2013 1805   GFRAA >60 10/08/2016 1820   GFRAA >60 06/01/2013 1805    INR    Component Value Date/Time   INR 1.0 01/15/2023 0925     Intake/Output Summary (Last 24 hours) at 01/23/2023 1427 Last data filed at 01/22/2023 1900 Gross per 24 hour  Intake 240 ml  Output --  Net 240 ml    Assessment/Plan:  42 y.o. female is s/p right lower extremity BKA . 6 Days Post-Op   Plan: Dressing changed yesterday.  No complications to note.  No hematoma seroma or infection to note.  Staples all well intact.  Dressing changes daily by nursing staff as ordered.  Patient complaint of ace bandage being too tight. I redressed ace bandage and immediately patient endorses stump feeling less painful.  Patient to continue on home prescribed Eliquis 5 mg twice daily. Okay per vascular surgery for patient to discharge to rehab if medically stable. Patient must do exercises to prevent early contracture every hour while awake.  Continue pain medications as needed. OOB to chair most of the day. Continue to work with PT/OT.  Continue exercises to prevent contracture. Ambulate with assistance. Continue muscle relaxers and nerve agents as prescribed 5 Days Post-Op    DVT  prophylaxis:   Eliquis 5 mg twice daily     Marcie Bal [facility] 01/23/2023 2:27 PM

## 2023-01-23 NOTE — Inpatient Diabetes Management (Signed)
Inpatient Diabetes Program Recommendations  AACE/ADA: New Consensus Statement on Inpatient Glycemic Control   Target Ranges:  Prepandial:   less than 140 mg/dL      Peak postprandial:   less than 180 mg/dL (1-2 hours)      Critically ill patients:  140 - 180 mg/dL    Latest Reference Range & Units 01/23/23 04:56  Glucose 70 - 99 mg/dL 147 (H)    Latest Reference Range & Units 01/22/23 07:42 01/22/23 11:04 01/22/23 16:26 01/22/23 19:57 01/22/23 20:31 01/23/23 00:11  Glucose-Capillary 70 - 99 mg/dL 829 (H)  Novolog 19 units @9 :33  Semglee 40 units @9 :44 246 (H)  Novolog 22 units @12 :26 89  Novolog 15 units @17 :43 42 (LL) 101 (H) 113 (H)      Semglee 40 units @00 :12   Review of Glycemic Control  Diabetes history: DM2 Outpatient Diabetes medications: 70/30 45 units TID with meals, Metformin 1000 mg BID Current orders for Inpatient glycemic control: Semglee 40 units BID, Novolog 0-20 units TID with meals, Novolog 0-5 units at bedtime, Novolog 15 units TID with meals   Inpatient Diabetes Program Recommendations:     Insulin: Noted CBG down to 42 mg/dl at 56:21 (per hypoglycemia note in chart low likely due to inadequate meal intake).  Please consider changing meal coverage insulin to Novolog 15 units BID (with breakfast and lunch) and Novolog 10 units with supper.    NURSING please be sure patient eats at least 50% of meal before giving meal coverage insulin.    Thanks, Orlando Penner, RN, MSN, CDCES Diabetes Coordinator Inpatient Diabetes Program [phone] (Team Pager from 8am to 5pm)

## 2023-01-23 NOTE — H&P (Shared)
Physical Medicine and Rehabilitation Admission H&P    Chief Complaint  Patient presents with   Functional deficits due to BKA    HPI: Jennifer [name] is a 42 year old female with history of T1DM, HTN, A flutter, ongoing tobacco use who was admitted on [facility] on 01/15/23 with 2 day history of RLE pain with coldness, numbness and difficulty walking. She was found to have ischemic RLE, lack of DP/PT on dopplers, was anemic with hemoglobin 7.5 and BS 602 at admission. She was started on IV heparin and  taken to OR for aortogram with mechanical thrombectomy to right anterior tibial artery and stent placement and right popliteal artery on the same day by Dr. Wyn Quaker.  She  had drop in hemoglobin to 5.9 and was transfused with 2 units PRBC.  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 Wallace Cullens.  Postop pain continue to be an issue.  She required additional units PRBC on 12/14 with most recent CBC revealing H&H at 8.0 and 27.6.  Review of Systems  Constitutional:  Negative for chills and fever.  HENT:  Negative for hearing loss.   Eyes:  Negative for blurred vision and double vision.  Respiratory:  Negative for cough and shortness of breath.   Cardiovascular:  Negative for chest pain and palpitations.  Gastrointestinal:  Negative for constipation and heartburn.  Genitourinary:  Negative for dysuria and urgency.       Vaginal bleeding with clots for past 3 days.   Musculoskeletal:  Positive for myalgias.  Neurological:  Positive for sensory change and weakness. Negative for dizziness.  Psychiatric/Behavioral:  The patient is nervous/anxious and has insomnia.      Past Medical History:  Diagnosis Date   Abnormal uterine bleeding    Due to fibriods/required transfusions   Atrial fibrillation with RVR (HCC) 05/2022   underwent cardioversion by Dr. Mariah Milling   Closed fracture of coronoid process of right ulna 2011   Diabetes mellitus without complication  (HCC)    Headache    Hypertension    Menorrhagia    OSA (obstructive sleep apnea)     Past Surgical History:  Procedure Laterality Date   AMPUTATION Right 01/17/2023   Procedure: AMPUTATION BELOW KNEE;  Surgeon: Iline Oven, MD;  Location: [facility] ORS;  Service: Vascular;  Laterality: Right;   CESAREAN SECTION     x2   LOWER EXTREMITY ANGIOGRAPHY Right 01/15/2023   Procedure: Lower Extremity Angiography;  Surgeon: Annice Needy, MD;  Location: [facility] INVASIVE CV LAB;  Service: Cardiovascular;  Laterality: Right;   none      Family History  Adopted: Yes  Problem Relation Age of Onset   Diabetes Other    Hypertension Other     Social History:  Married. Independent and working a year ago. Was working on getting disability PTA. She  reports that she has been smoking--1.5 PPD. She has never used smokeless tobacco. She reports that she does not drink alcohol and does not use drugs.   Allergies  Allergen Reactions   Demerol [Meperidine] Hives, Itching and Nausea And Vomiting   Meloxicam Nausea And Vomiting   Morphine Other (See Comments)    Other Reaction: tolerates hydromorphone   Oxycodone Hives, Itching and Nausea And Vomiting   Toradol [Ketorolac Tromethamine] Hives, Itching and Nausea And Vomiting    Medications Prior to Admission  Medication Sig Dispense Refill   apixaban (ELIQUIS) 5 MG TABS tablet  Take 1 tablet (5 mg total) by mouth 2 (two) times daily.     atorvastatin (LIPITOR) 40 MG tablet Take 0.5 tablets (20 mg total) by mouth daily. (Patient not taking: Reported on 01/15/2023) 30 tablet 0   DEPO-PROVERA 150 MG/ML injection Inject 150 mg into the muscle every 3 (three) months.     diltiazem (CARDIZEM CD) 180 MG 24 hr capsule Take 1 capsule (180 mg total) by mouth daily. (Patient not taking: Reported on 01/15/2023) 30 capsule 1   ferrous sulfate 325 (65 FE) MG EC tablet Take 1 tablet (325 mg total) by mouth 2 (two) times daily. 60 tablet 3   gabapentin (NEURONTIN) 400 MG  capsule Take 1 capsule (400 mg total) by mouth 3 (three) times daily.     HYDROcodone-acetaminophen (NORCO/VICODIN) 5-325 MG tablet Take 2 tablets by mouth every 6 (six) hours as needed for moderate pain (pain score 4-6).     HYDROmorphone (DILAUDID) 2 MG tablet Take 1 tablet (2 mg total) by mouth every 4 (four) hours as needed for severe pain (pain score 7-10).     insulin aspart (NOVOLOG) 100 UNIT/ML injection Inject 10 Units into the skin 3 (three) times daily with meals.     insulin glargine-yfgn (SEMGLEE) 100 UNIT/ML injection Inject 0.4 mLs (40 Units total) into the skin 2 (two) times daily.     methocarbamol 1000 MG TABS Take 1,000 mg by mouth 3 (three) times daily.     nicotine (NICODERM CQ - DOSED IN MG/24 HOURS) 21 mg/24hr patch Place 1 patch (21 mg total) onto the skin daily. (Patient not taking: Reported on 01/15/2023) 28 patch 0   ondansetron (ZOFRAN-ODT) 4 MG disintegrating tablet Take 1 tablet (4 mg total) by mouth every 6 (six) hours as needed for nausea or vomiting.      Home: Home Living Family/patient expects to be discharged to:: Private residence Living Arrangements: Spouse/significant other, Children Available Help at Discharge: Family Type of Home: House Home Access: Stairs to enter Secretary/administrator of Steps: 4-5 Entrance Stairs-Rails: Right Home Layout: One level Bathroom Shower/Tub: Engineer, manufacturing systems: Standard Bathroom Accessibility: No Home Equipment: Crutches   Functional History: Prior Function Prior Level of Function : Independent/Modified Independent Mobility Comments: Independent without AE PTA, denies falls history. ADLs Comments: Independent, caring for family, eager to get back to PLOF  Functional Status:  Mobility: Bed Mobility Overal bed mobility: Needs Assistance Bed Mobility: Supine to Sit Supine to sit: Min assist Sit to supine: Min assist General bed mobility comments: Pt was in recliner pre/post  session Transfers Overall transfer level: Needs assistance Equipment used: Rolling walker (2 wheels) Transfers: Sit to/from Stand Sit to Stand: Min assist Bed to/from chair/wheelchair/BSC transfer type:: Squat pivot Stand pivot transfers: Contact guard assist Squat pivot transfers: Contact guard assist Step pivot transfers: Min assist General transfer comment: pt deferred citing fatigue from recent PT session Ambulation/Gait Ambulation/Gait assistance: Contact guard assist Gait Distance (Feet): 4 Feet Assistive device: Rolling walker (2 wheels) Gait Pattern/deviations: Step-to pattern, Antalgic General Gait Details: pt endorses throbbing in standing and only willing to ambulate from recliner back to EOB. RN gave pain meds while Thereasa Parkin was in room. Gait velocity: decreased Wheelchair Mobility Wheelchair mobility: Yes Wheelchair propulsion: Both upper extremities Wheelchair parts: Needs assistance Distance: 140 Wheelchair Assistance Details (indicate cue type and reason): max verbal cues for technique (pivots, propulsion, retro etc). max assist needed to manage WC parts (brakes, arm rest)  ADL: ADL Overall ADL's : Needs assistance/impaired Functional mobility  during ADLs: Contact guard assist, Cueing for safety, Rolling walker (2 wheels) General ADL Comments: SETUP seated grooming tasks  Cognition: Cognition Overall Cognitive Status: Within Functional Limits for tasks assessed Orientation Level: Oriented X4 Cognition Arousal: Alert Behavior During Therapy: WFL for tasks assessed/performed Overall Cognitive Status: Within Functional Limits for tasks assessed General Comments: reports feeling depressed   Blood pressure (!) 148/82, pulse 95, temperature 98.4 F (36.9 C), temperature source Oral, resp. rate 18, height 5\' 6"  (1.676 m), weight 96.6 kg, SpO2 100%. Physical Exam  Results for orders placed or performed during the hospital encounter of 01/15/23 (from the past 48  hours)  Glucose, capillary     Status: Abnormal   Collection Time: 01/21/23  9:09 PM  Result Value Ref Range   Glucose-Capillary 64 (L) 70 - 99 mg/dL    Comment: Glucose reference range applies only to samples taken after fasting for at least 8 hours.   Comment 1 Notify RN   Glucose, capillary     Status: Abnormal   Collection Time: 01/21/23 10:44 PM  Result Value Ref Range   Glucose-Capillary 162 (H) 70 - 99 mg/dL    Comment: Glucose reference range applies only to samples taken after fasting for at least 8 hours.   Comment 1 Notify RN   Basic metabolic panel     Status: Abnormal   Collection Time: 01/22/23  5:27 AM  Result Value Ref Range   Sodium 133 (L) 135 - 145 mmol/L   Potassium 5.5 (H) 3.5 - 5.1 mmol/L   Chloride 104 98 - 111 mmol/L   CO2 21 (L) 22 - 32 mmol/L   Glucose, Bld 199 (H) 70 - 99 mg/dL    Comment: Glucose reference range applies only to samples taken after fasting for at least 8 hours.   BUN 28 (H) 6 - 20 mg/dL   Creatinine, Ser 6.96 (H) 0.44 - 1.00 mg/dL   Calcium 8.7 (L) 8.9 - 10.3 mg/dL   GFR, Estimated 42 (L) >60 mL/min    Comment: (NOTE) Calculated using the CKD-EPI Creatinine Equation (2021)    Anion gap 8 5 - 15    Comment: Performed at [facility], [address] [place], [place] [address]  CBC     Status: Abnormal   Collection Time: 01/22/23  5:27 AM  Result Value Ref Range   WBC 13.3 (H) 4.0 - 10.5 K/uL   RBC 3.64 (L) 3.87 - 5.11 MIL/uL   Hemoglobin 7.4 (L) 12.0 - 15.0 g/dL    Comment: Reticulocyte Hemoglobin testing may be clinically indicated, consider ordering this additional test [id number]    HCT 26.0 (L) 36.0 - 46.0 %   MCV 71.4 (L) 80.0 - 100.0 fL   MCH 20.3 (L) 26.0 - 34.0 pg   MCHC 28.5 (L) 30.0 - 36.0 g/dL   RDW 02.7 (H) 25.3 - 66.4 %   Platelets 267 150 - 400 K/uL   nRBC 0.0 0.0 - 0.2 %    Comment: Performed at [facility], [address] [place], [place] [address]  Glucose, capillary     Status: Abnormal    Collection Time: 01/22/23  7:42 AM  Result Value Ref Range   Glucose-Capillary 191 (H) 70 - 99 mg/dL    Comment: Glucose reference range applies only to samples taken after fasting for at least 8 hours.  Glucose, capillary     Status: Abnormal   Collection Time: 01/22/23 11:04 AM  Result Value Ref Range   Glucose-Capillary  246 (H) 70 - 99 mg/dL    Comment: Glucose reference range applies only to samples taken after fasting for at least 8 hours.  Glucose, capillary     Status: None   Collection Time: 01/22/23  4:26 PM  Result Value Ref Range   Glucose-Capillary 89 70 - 99 mg/dL    Comment: Glucose reference range applies only to samples taken after fasting for at least 8 hours.  Glucose, capillary     Status: Abnormal   Collection Time: 01/22/23  7:57 PM  Result Value Ref Range   Glucose-Capillary 42 (LL) 70 - 99 mg/dL    Comment: Glucose reference range applies only to samples taken after fasting for at least 8 hours.   Comment 1 Notify RN   Glucose, capillary     Status: Abnormal   Collection Time: 01/22/23  8:31 PM  Result Value Ref Range   Glucose-Capillary 101 (H) 70 - 99 mg/dL    Comment: Glucose reference range applies only to samples taken after fasting for at least 8 hours.  Glucose, capillary     Status: Abnormal   Collection Time: 01/23/23 12:11 AM  Result Value Ref Range   Glucose-Capillary 113 (H) 70 - 99 mg/dL    Comment: Glucose reference range applies only to samples taken after fasting for at least 8 hours.  Basic metabolic panel     Status: Abnormal   Collection Time: 01/23/23  4:56 AM  Result Value Ref Range   Sodium 134 (L) 135 - 145 mmol/L   Potassium 4.8 3.5 - 5.1 mmol/L   Chloride 100 98 - 111 mmol/L   CO2 23 22 - 32 mmol/L   Glucose, Bld 125 (H) 70 - 99 mg/dL    Comment: Glucose reference range applies only to samples taken after fasting for at least 8 hours.   BUN 22 (H) 6 - 20 mg/dL   Creatinine, Ser 0.98 (H) 0.44 - 1.00 mg/dL   Calcium 8.9 8.9 -  11.9 mg/dL   GFR, Estimated >14 >78 mL/min    Comment: (NOTE) Calculated using the CKD-EPI Creatinine Equation (2021)    Anion gap 11 5 - 15    Comment: Performed at [facility], [address] [place], [place] [address]  CBC     Status: Abnormal   Collection Time: 01/23/23  4:56 AM  Result Value Ref Range   WBC 13.3 (H) 4.0 - 10.5 K/uL   RBC 3.98 3.87 - 5.11 MIL/uL   Hemoglobin 8.0 (L) 12.0 - 15.0 g/dL    Comment: Reticulocyte Hemoglobin testing may be clinically indicated, consider ordering this additional test [id number]    HCT 27.6 (L) 36.0 - 46.0 %   MCV 69.3 (L) 80.0 - 100.0 fL   MCH 20.1 (L) 26.0 - 34.0 pg   MCHC 29.0 (L) 30.0 - 36.0 g/dL   RDW 46.9 (H) 62.9 - 52.8 %   Platelets 275 150 - 400 K/uL   nRBC 0.0 0.0 - 0.2 %    Comment: Performed at [facility], [address] [place], [place] [address]  Glucose, capillary     Status: Abnormal   Collection Time: 01/23/23  8:46 AM  Result Value Ref Range   Glucose-Capillary 151 (H) 70 - 99 mg/dL    Comment: Glucose reference range applies only to samples taken after fasting for at least 8 hours.   Comment 1 Notify RN    Comment 2 Document in Chart   Sedimentation rate  Status: Abnormal   Collection Time: 01/23/23 11:22 AM  Result Value Ref Range   Sed Rate 129 (H) 0 - 20 mm/hr    Comment: Performed at [facility], [address] [place], [place] [address]  hCG, quantitative, pregnancy     Status: None   Collection Time: 01/23/23 11:22 AM  Result Value Ref Range   hCG, Beta Chain, Quant, S 1 <5 mIU/mL    Comment:          GEST. AGE      CONC.  (mIU/mL)   <=1 WEEK        5 - 50     2 WEEKS       50 - 500     3 WEEKS       100 - 10,000     4 WEEKS     1,000 - 30,000     5 WEEKS     3,500 - 115,000   6-8 WEEKS     12,000 - 270,000    12 WEEKS     15,000 - 220,000        FEMALE AND NON-PREGNANT FEMALE:     LESS THAN 5 mIU/mL Performed at [facility], [address]  [place], [place] [address]   Glucose, capillary     Status: Abnormal   Collection Time: 01/23/23 11:45 AM  Result Value Ref Range   Glucose-Capillary 184 (H) 70 - 99 mg/dL    Comment: Glucose reference range applies only to samples taken after fasting for at least 8 hours.   Comment 1 Notify RN    Comment 2 Document in Chart    DG Chest Port 1 View Result Date: 01/23/2023 CLINICAL DATA:  Vasculitis. EXAM: PORTABLE CHEST 1 VIEW COMPARISON:  05/26/2022 FINDINGS: The lungs are clear without focal pneumonia, edema, pneumothorax or pleural effusion. The cardiopericardial silhouette is within normal limits for size. No acute bony abnormality. IMPRESSION: No active disease. Electronically Signed   By: Kennith Center M.D.   On: 01/23/2023 14:55      Blood pressure (!) 148/82, pulse 95, temperature 98.4 F (36.9 C), temperature source Oral, resp. rate 18, height 5\' 6"  (1.676 m), weight 96.6 kg, SpO2 100%.  Medical Problem List and Plan: 1. Functional deficits secondary to ***  -patient may *** shower  -ELOS/Goals: *** 2.  Antithrombotics: -DVT/anticoagulation:  Pharmaceutical: Eliquis  -antiplatelet therapy: N/A 3. Pain Management:  Dilaudid 2mg   prn for severe pain,  hydrocodone 10/650 mg prn for severe pain --On robaxin 1000 mg TID-->will add flexeril prn also as reports tightness right thigh ---On  gabapentin 400 mg TID-->increase to QID for neuropathic symptoms 4. Mood/Behavior/Sleep:  LCSW to follow for evaluation and support.   -antipsychotic agents: N/A 5. Neuropsych/cognition: This patient is capable of making decisions on her own behalf. 6. Skin/Wound Care: Routine pressure relief measures.  7. Fluids/Electrolytes/Nutrition: Monitor I/O. Check CMET in am 8.  H/o A flutter: Monitor HR TID--continue Cardizem and Apixaban  --monitor for symptoms with increase in activity.  9. T1DM: Hgb A1c-9.2 and blood sugars poorly controlled. BS 800 during August hospitalization  --monitor BS ac/hs/3  am and use SSI for elevated BS. Will change meal coverage to [facility] b'fast/lunch as she cotinues to drop at late afternoons. May drop further with 3.5 hrs of therapy.   --Continue Insulin glargline bid--reports compliance at home.  10. Hyponatremia: Improved from 127 @ admission to 134  --Monitor for now 11. CKD?:  Per chart review  SCr-1.56 on 08/24 and  1.33 @ admission.   --Monitor renal status with serial checks. Avoid nephrotoxic medications.  12. Acute on chronic  iron deficiency anemia:  Has been transfused with 3 units PRBCs.   --Add iron due to severe iron deficiency --Continue to monitor H&H for stability.    --hx of menorrhagia/Fibroids-->on depo with good results PTA but has had recurrance w/clots past 3 days.  13. Leukocytosis: Monitor for fevers and other signs of infection.   ***  Jacquelynn Cree, PA-C 01/23/2023

## 2023-01-23 NOTE — Discharge Summary (Signed)
Jennifer [name]:[id number] DOB: 1980-09-17 DOA: 01/15/2023  PCP: [facility]  Admit date: 01/15/2023 Discharge date: 01/23/2023  Time spent: 35 minutes  Recommendations for Outpatient Follow-up:  Vascular surgery f/u 3 weeks, need to call and schedule Daily dressing changes (see discharge instructions from vascular) Outpatient pcp f/u, consider dermatology f/u if need for ongoing vasculitis w/u  Monitor hemoglobin, kidney function, and electrolytes F/u results of vasculitis w/u    Discharge Diagnoses:  Principal Problem:   Limb ischemia Active Problems:   Atrial fibrillation with RVR (HCC)   Acute blood loss anemia   Essential hypertension   Poorly controlled type 2 diabetes mellitus with peripheral neuropathy (HCC)   Obesity   Anemia   Acute lower limb ischemia   Discharge Condition: stable  Diet recommendation: carb modified  Filed Weights   01/15/23 0822 01/15/23 0838 01/15/23 1525  Weight: 96.6 kg 96.6 kg 96.6 kg    History of present illness:  From admission h and p Jennifer [name] is a 42 y.o. female with medical history significant of PAF on aspirin, HTN, IDDM, chronic diabetic neuropathy, presented with worsening of right foot pain.   Patient does have a chronic bilateral feet pain right more than left which she attributed to diabetic neuropathy and her gabapentin was increased recently to address that issue.  2 days ago patient started to have right calf pain, claudication worsening with ambulation and last night patient woke up with severe pain of right foot.  She denied any chest pains.  She was diagnosed with A-fib August this year and has been taking Cardizem and aspirin.  Hospital Course:  Patient presents with acute right lower extremity ischemia now s/p angiogram with popliteal stent and right BKA. Pain control has been a challenge but patient has been transitioned to oral meds. She has a-fib and we have started apixaban. She  will need close monitoring of hgb given baseline anemia from menstrual bleeding (no bleeding here). She did require several units of blood here post-op and her hgb has been stable in the 8s. Assuming her hgb remains stable she will need close f/u with ob/gyn for the menstrual bleeding. She required frequent dose adjustments of insulin and is at risk for hypoglycemia if she doesn't eat at least half of her meal. She had aki with hyperkalemia that resolved with fluids, this will need to be monitored and ensure she is drinking well. 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 This can be secondary to her known atherosclerotic disease but other vasculidities cannot be exluded. We will start this w/u with urinalysis, cxr, hepatitis panel, cryoglobulins, ana, rf, c4, esr/crp, anca. These results will need to be followed up. Consider outpatient dermatology f/u for ongoing workup depending on the above results. Will need to f/u with vascular surgery in 3 weeks for staple removal and assessment.  Procedures: Angiogram, right BKA   Consultations: Vascular surgery  Discharge Exam: Vitals:   01/23/23 0341 01/23/23 0805  BP: 134/82 (!) 148/82  Pulse: 92 95  Resp: 20 18  Temp: 99.1 F (37.3 C) 98.4 F (36.9 C)  SpO2: 100% 100%    General: NAD Cardiovascular: RRR Respiratory: CTAB Ext: right stump bandaged  Discharge Instructions   Discharge Instructions     Diet Carb Modified   Complete by: As directed    Discharge wound care:   Complete by: As directed    See discharge instructions   Increase  activity slowly   Complete by: As directed       Allergies as of 01/23/2023       Reactions   Demerol [meperidine] Hives, Itching, Nausea And Vomiting   Meloxicam Nausea And Vomiting   Morphine Other (See Comments)   Other Reaction: tolerates hydromorphone   Oxycodone Hives, Itching, Nausea And Vomiting   Toradol [ketorolac  Tromethamine] Hives, Itching, Nausea And Vomiting        Medication List     STOP taking these medications    cyclobenzaprine 10 MG tablet Commonly known as: FLEXERIL   folic acid 1 MG tablet Commonly known as: FOLVITE   gabapentin 800 MG tablet Commonly known as: NEURONTIN Replaced by: gabapentin 400 MG capsule   hydrALAZINE 25 MG tablet Commonly known as: APRESOLINE   hydrochlorothiazide 25 MG tablet Commonly known as: HYDRODIURIL   lisinopril 40 MG tablet Commonly known as: ZESTRIL   metFORMIN 1000 MG tablet Commonly known as: GLUCOPHAGE   NovoLOG 70/30 FlexPen (70-30) 100 UNIT/ML FlexPen Generic drug: insulin aspart protamine - aspart       TAKE these medications    apixaban 5 MG Tabs tablet Commonly known as: ELIQUIS Take 1 tablet (5 mg total) by mouth 2 (two) times daily.   atorvastatin 40 MG tablet Commonly known as: LIPITOR Take 0.5 tablets (20 mg total) by mouth daily.   Depo-Provera 150 MG/ML injection Generic drug: medroxyPROGESTERone Inject 150 mg into the muscle every 3 (three) months.   diltiazem 180 MG 24 hr capsule Commonly known as: CARDIZEM CD Take 1 capsule (180 mg total) by mouth daily.   ferrous sulfate 325 (65 FE) MG EC tablet Take 1 tablet (325 mg total) by mouth 2 (two) times daily.   gabapentin 400 MG capsule Commonly known as: NEURONTIN Take 1 capsule (400 mg total) by mouth 3 (three) times daily. Replaces: gabapentin 800 MG tablet   HYDROcodone-acetaminophen 5-325 MG tablet Commonly known as: NORCO/VICODIN Take 2 tablets by mouth every 6 (six) hours as needed for moderate pain (pain score 4-6). What changed:  how much to take when to take this reasons to take this   HYDROmorphone 2 MG tablet Commonly known as: DILAUDID Take 1 tablet (2 mg total) by mouth every 4 (four) hours as needed for severe pain (pain score 7-10).   insulin aspart 100 UNIT/ML injection Commonly known as: novoLOG Inject 10 Units into the skin  3 (three) times daily with meals.   insulin glargine-yfgn 100 UNIT/ML injection Commonly known as: SEMGLEE Inject 0.4 mLs (40 Units total) into the skin 2 (two) times daily.   Methocarbamol 1000 MG Tabs Take 1,000 mg by mouth 3 (three) times daily.   nicotine 21 mg/24hr patch Commonly known as: NICODERM CQ - dosed in mg/24 hours Place 1 patch (21 mg total) onto the skin daily.   ondansetron 4 MG disintegrating tablet Commonly known as: ZOFRAN-ODT Take 1 tablet (4 mg total) by mouth every 6 (six) hours as needed for nausea or vomiting.               Discharge Care Instructions  (From admission, onward)           Start     Ordered   01/23/23 0000  Discharge wound care:       Comments: See discharge instructions   01/23/23 1120           Allergies  Allergen Reactions   Demerol [Meperidine] Hives, Itching and Nausea And Vomiting   Meloxicam  Nausea And Vomiting   Morphine Other (See Comments)    Other Reaction: tolerates hydromorphone   Oxycodone Hives, Itching and Nausea And Vomiting   Toradol [Ketorolac Tromethamine] Hives, Itching and Nausea And Vomiting    Follow-up Information     Georgiana Spinner, NP Follow up on 02/17/2023.   Specialty: Vascular Surgery Why: Staple Removal  Follow up appt with Sheppard Plumber  NP on Tuesday January 2025 at 1:30 Contact information: [address] [place] [address] [phone]                  The results of significant diagnostics from this hospitalization (including imaging, microbiology, ancillary and laboratory) are listed below for reference.    Significant Diagnostic Studies: DG Chest Port 1 View Result Date: 01/23/2023 CLINICAL DATA:  Vasculitis. EXAM: PORTABLE CHEST 1 VIEW COMPARISON:  05/26/2022 FINDINGS: The lungs are clear without focal pneumonia, edema, pneumothorax or pleural effusion. The cardiopericardial silhouette is within normal limits for size. No acute bony  abnormality. IMPRESSION: No active disease. Electronically Signed   By: Kennith Center M.D.   On: 01/23/2023 14:55   DG Knee 1-2 Views Right Result Date: 01/19/2023 CLINICAL DATA:  Right knee pain EXAM: RIGHT KNEE - 1-2 VIEW COMPARISON:  None Available. FINDINGS: Normal alignment. No acute fracture or dislocation. Osteotomy noted within the visualized proximal fibular diaphysis. No effusion. Vascular stent graft noted within the adductor hiatus and popliteal artery. IMPRESSION: 1. No acute fracture or dislocation. Electronically Signed   By: Helyn Numbers M.D.   On: 01/19/2023 21:03   [place] OR NERVE BLOCK-IMAGE ONLY [facility]) Result Date: 01/17/2023 There is no interpretation for this exam.  This order is for images obtained during a surgical procedure.  Please See "Surgeries" Tab for more information regarding the procedure.   ECHOCARDIOGRAM COMPLETE Result Date: 01/16/2023    ECHOCARDIOGRAM REPORT   Patient Name:   Jennifer [name] Date of Exam: 01/16/2023 Medical Rec #:  [id number]       Height:       66.0 in Accession #:    [id number]      Weight:       213.0 lb Date of Birth:  1981/02/03       BSA:          2.054 m Patient Age:    42 years        BP:           142/80 mmHg Patient Gender: F               HR:           110 bpm. Exam Location:  [facility] Procedure: 2D Echo, Cardiac Doppler and Color Doppler Indications:     Atrial Fibrillation  History:         Patient has prior history of Echocardiogram examinations, most                  recent 05/24/2022. Arrythmias:Atrial Fibrillation; Risk                  Factors:Hypertension, Sleep Apnea, Diabetes, Dyslipidemia and                  Current Smoker.  Sonographer:     Mikki Harbor Referring Phys:  [id number] Marlow Baars DEW Diagnosing Phys: Debbe Odea MD  Sonographer Comments: Image acquisition challenging due to respiratory motion. IMPRESSIONS  1. Left ventricular ejection fraction, by estimation, is 65 to 70%.  The left ventricle has normal function. The  left ventricle has no regional wall motion abnormalities. There is mild left ventricular hypertrophy. Left ventricular diastolic parameters are consistent with Grade I diastolic dysfunction (impaired relaxation).  2. Right ventricular systolic function is normal. The right ventricular size is normal. There is normal pulmonary artery systolic pressure.  3. The mitral valve is normal in structure. No evidence of mitral valve regurgitation.  4. The aortic valve is tricuspid. Aortic valve regurgitation is not visualized.  5. The inferior vena cava is normal in size with greater than 50% respiratory variability, suggesting right atrial pressure of 3 mmHg. FINDINGS  Left Ventricle: Left ventricular ejection fraction, by estimation, is 65 to 70%. The left ventricle has normal function. The left ventricle has no regional wall motion abnormalities. The left ventricular internal cavity size was normal in size. There is  mild left ventricular hypertrophy. Left ventricular diastolic parameters are consistent with Grade I diastolic dysfunction (impaired relaxation). Right Ventricle: The right ventricular size is normal. No increase in right ventricular wall thickness. Right ventricular systolic function is normal. There is normal pulmonary artery systolic pressure. The tricuspid regurgitant velocity is 1.51 m/s, and  with an assumed right atrial pressure of 3 mmHg, the estimated right ventricular systolic pressure is 12.1 mmHg. Left Atrium: Left atrial size was normal in size. Right Atrium: Right atrial size was normal in size. Pericardium: There is no evidence of pericardial effusion. Mitral Valve: The mitral valve is normal in structure. No evidence of mitral valve regurgitation. MV peak gradient, 9.0 mmHg. The mean mitral valve gradient is 3.0 mmHg. Tricuspid Valve: The tricuspid valve is normal in structure. Tricuspid valve regurgitation is not demonstrated. Aortic Valve: The aortic valve is tricuspid. Aortic valve  regurgitation is not visualized. Aortic valve mean gradient measures 8.0 mmHg. Aortic valve peak gradient measures 16.6 mmHg. Aortic valve area, by VTI measures 2.78 cm. Pulmonic Valve: The pulmonic valve was not well visualized. Pulmonic valve regurgitation is not visualized. Aorta: The aortic root is normal in size and structure. Venous: The inferior vena cava is normal in size with greater than 50% respiratory variability, suggesting right atrial pressure of 3 mmHg. IAS/Shunts: No atrial level shunt detected by color flow Doppler.  LEFT VENTRICLE PLAX 2D LVIDd:         4.00 cm   Diastology LVIDs:         2.80 cm   LV e' medial:    6.42 cm/s LV PW:         1.60 cm   LV E/e' medial:  10.8 LV IVS:        1.50 cm   LV e' lateral:   9.14 cm/s LVOT diam:     2.00 cm   LV E/e' lateral: 7.6 LV SV:         95 LV SV Index:   46 LVOT Area:     3.14 cm  RIGHT VENTRICLE RV Basal diam:  3.30 cm RV Mid diam:    3.30 cm RV S prime:     23.00 cm/s TAPSE (M-mode): 2.3 cm LEFT ATRIUM             Index        RIGHT ATRIUM           Index LA diam:        3.70 cm 1.80 cm/m   RA Area:     14.50 cm LA Vol (A2C):   62.0 ml 30.18 ml/m  RA  Volume:   36.30 ml  17.67 ml/m LA Vol (A4C):   41.7 ml 20.30 ml/m LA Biplane Vol: 52.7 ml 25.66 ml/m  AORTIC VALVE                     PULMONIC VALVE AV Area (Vmax):    2.57 cm      PV Vmax:       1.50 m/s AV Area (Vmean):   2.44 cm      PV Peak grad:  9.0 mmHg AV Area (VTI):     2.78 cm AV Vmax:           204.00 cm/s AV Vmean:          130.000 cm/s AV VTI:            0.341 m AV Peak Grad:      16.6 mmHg AV Mean Grad:      8.0 mmHg LVOT Vmax:         167.00 cm/s LVOT Vmean:        101.000 cm/s LVOT VTI:          0.302 m LVOT/AV VTI ratio: 0.89  AORTA Ao Root diam: 3.40 cm MITRAL VALVE                TRICUSPID VALVE MV Area (PHT): 4.93 cm     TR Peak grad:   9.1 mmHg MV Area VTI:   3.82 cm     TR Vmax:        151.00 cm/s MV Peak grad:  9.0 mmHg MV Mean grad:  3.0 mmHg     SHUNTS MV Vmax:        1.50 m/s     Systemic VTI:  0.30 m MV Vmean:      82.5 cm/s    Systemic Diam: 2.00 cm MV Decel Time: 154 msec MV E velocity: 69.20 cm/s MV A velocity: 148.00 cm/s MV E/A ratio:  0.47 Debbe Odea MD Electronically signed by Debbe Odea MD Signature Date/Time: 01/16/2023/4:23:22 PM    Final    PERIPHERAL VASCULAR CATHETERIZATION Result Date: 01/15/2023 See surgical note for result.   Microbiology: Recent Results (from the past 240 hours)  MRSA Next Gen by PCR, Nasal     Status: None   Collection Time: 01/15/23  6:43 PM   Specimen: Nasal Mucosa; Nasal Swab  Result Value Ref Range Status   MRSA by PCR Next Gen NOT DETECTED NOT DETECTED Final    Comment: (NOTE) The GeneXpert MRSA Assay (FDA approved for NASAL specimens only), is one component of a comprehensive MRSA colonization surveillance program. It is not intended to diagnose MRSA infection nor to guide or monitor treatment for MRSA infections. Test performance is not FDA approved in patients less than 68 years old. Performed at [facility], [address] [place], [place] [address]      Labs: Basic Metabolic Panel: Recent Labs  Lab 01/20/23 0322 01/21/23 0533 01/21/23 1152 01/22/23 0527 01/23/23 0456  NA 132* 130* 134* 133* 134*  K 4.7 6.1* 4.5 5.5* 4.8  CL 100 101 102 104 100  CO2 23 20* 23 21* 23  GLUCOSE 391* 313* 160* 199* 125*  BUN 15 28* 30* 28* 22*  CREATININE 1.31* 2.16* 1.84* 1.54* 1.11*  CALCIUM 8.8* 8.8* 8.9 8.7* 8.9   Liver Function Tests: No results for input(s): "AST", "ALT", "ALKPHOS", "BILITOT", "PROT", "ALBUMIN" in the last 168 hours. No results for input(s): "LIPASE", "AMYLASE" in the last  168 hours. No results for input(s): "AMMONIA" in the last 168 hours. CBC: Recent Labs  Lab 01/18/23 0549 01/19/23 0551 01/21/23 0533 01/22/23 0527 01/23/23 0456  WBC 13.5* 10.2 14.1* 13.3* 13.3*  HGB 8.2* 8.1* 7.8* 7.4* 8.0*  HCT 28.0* 28.3* 27.9* 26.0* 27.6*  MCV 70.2* 71.5*  72.5* 71.4* 69.3*  PLT 295 296 297 267 275   Cardiac Enzymes: No results for input(s): "CKTOTAL", "CKMB", "CKMBINDEX", "TROPONINI" in the last 168 hours. BNP: BNP (last 3 results) No results for input(s): "BNP" in the last 8760 hours.  ProBNP (last 3 results) No results for input(s): "PROBNP" in the last 8760 hours.  CBG: Recent Labs  Lab 01/22/23 2031 01/23/23 0011 01/23/23 0846 01/23/23 1145 01/23/23 1649  GLUCAP 101* 113* 151* 184* 73       Signed:  Silvano Bilis MD.  [facility] 01/23/2023, 5:26 PM

## 2023-01-23 NOTE — Progress Notes (Signed)
Inpatient Rehab Admissions Coordinator:    I have insurance approval and a bed available for pt to admit to CIR today. Dr. Ashok Pall in agreement, [facility] aware, and pt/family in agreement as well.  I will arrange [facility] transport as soon as we have a ready bed.  RN can call [phone] for report.    Estill Dooms, PT, DPT Admissions Coordinator [phone] 01/23/23  10:10 AM

## 2023-01-24 DIAGNOSIS — S88112S Complete traumatic amputation at level between knee and ankle, left lower leg, sequela: Secondary | ICD-10-CM | POA: Diagnosis not present

## 2023-01-24 LAB — CBC WITH DIFFERENTIAL/PLATELET
Abs Immature Granulocytes: 0.07 10*3/uL (ref 0.00–0.07)
Basophils Absolute: 0 10*3/uL (ref 0.0–0.1)
Basophils Relative: 0 %
Eosinophils Absolute: 0.4 10*3/uL (ref 0.0–0.5)
Eosinophils Relative: 3 %
HCT: 24.8 % — ABNORMAL LOW (ref 36.0–46.0)
Hemoglobin: 7.1 g/dL — ABNORMAL LOW (ref 12.0–15.0)
Immature Granulocytes: 1 %
Lymphocytes Relative: 16 %
Lymphs Abs: 2.2 10*3/uL (ref 0.7–4.0)
MCH: 20 pg — ABNORMAL LOW (ref 26.0–34.0)
MCHC: 28.6 g/dL — ABNORMAL LOW (ref 30.0–36.0)
MCV: 69.9 fL — ABNORMAL LOW (ref 80.0–100.0)
Monocytes Absolute: 1 10*3/uL (ref 0.1–1.0)
Monocytes Relative: 7 %
Neutro Abs: 10.1 10*3/uL — ABNORMAL HIGH (ref 1.7–7.7)
Neutrophils Relative %: 73 %
Platelets: 257 10*3/uL (ref 150–400)
RBC: 3.55 MIL/uL — ABNORMAL LOW (ref 3.87–5.11)
RDW: 25.8 % — ABNORMAL HIGH (ref 11.5–15.5)
WBC: 13.8 10*3/uL — ABNORMAL HIGH (ref 4.0–10.5)
nRBC: 0 % (ref 0.0–0.2)

## 2023-01-24 LAB — COMPREHENSIVE METABOLIC PANEL
ALT: 32 U/L (ref 0–44)
AST: 48 U/L — ABNORMAL HIGH (ref 15–41)
Albumin: 2.2 g/dL — ABNORMAL LOW (ref 3.5–5.0)
Alkaline Phosphatase: 106 U/L (ref 38–126)
Anion gap: 9 (ref 5–15)
BUN: 14 mg/dL (ref 6–20)
CO2: 24 mmol/L (ref 22–32)
Calcium: 8.9 mg/dL (ref 8.9–10.3)
Chloride: 104 mmol/L (ref 98–111)
Creatinine, Ser: 1.39 mg/dL — ABNORMAL HIGH (ref 0.44–1.00)
GFR, Estimated: 48 mL/min — ABNORMAL LOW (ref >60)
Glucose, Bld: 90 mg/dL (ref 70–99)
Potassium: 4.5 mmol/L (ref 3.5–5.1)
Sodium: 137 mmol/L (ref 135–145)
Total Bilirubin: 0.6 mg/dL (ref <1.2)
Total Protein: 7.6 g/dL (ref 6.5–8.1)

## 2023-01-24 LAB — GLUCOSE, CAPILLARY
Glucose-Capillary: 85 mg/dL (ref 70–99)
Glucose-Capillary: 124 mg/dL — ABNORMAL HIGH (ref 70–99)
Glucose-Capillary: 65 mg/dL — ABNORMAL LOW (ref 70–99)
Glucose-Capillary: 120 mg/dL — ABNORMAL HIGH (ref 70–99)
Glucose-Capillary: 116 mg/dL — ABNORMAL HIGH (ref 70–99)

## 2023-01-24 LAB — MAGNESIUM: Magnesium: 2.4 mg/dL (ref 1.7–2.4)

## 2023-01-24 LAB — VITAMIN D 25 HYDROXY (VIT D DEFICIENCY, FRACTURES): Vit D, 25-Hydroxy: 10.11 ng/mL — ABNORMAL LOW (ref 30–100)

## 2023-01-24 LAB — ANA W/REFLEX IF POSITIVE: Anti Nuclear Antibody (ANA): NEGATIVE

## 2023-01-24 MED ORDER — HYDROMORPHONE HCL 2 MG PO TABS
3.0000 mg | ORAL_TABLET | ORAL | Status: DC | PRN
  Administered 2023-01-24 – 2023-01-27 (×13): 3 mg via ORAL
  Filled 2023-01-24 (×13): qty 2

## 2023-01-24 MED ORDER — CYCLOBENZAPRINE HCL 5 MG PO TABS
7.5000 mg | ORAL_TABLET | Freq: Three times a day (TID) | ORAL | Status: DC
  Administered 2023-01-24 (×2): 7.5 mg via ORAL
  Filled 2023-01-24 (×2): qty 2

## 2023-01-24 NOTE — Plan of Care (Signed)
  Problem: RH Balance Goal: LTG Patient will maintain dynamic standing with ADLs (OT) Description: LTG:  Patient will maintain dynamic standing balance with assist during activities of daily living (OT)  Flowsheets (Taken 01/24/2023 1224) LTG: Pt will maintain dynamic standing balance during ADLs with: Independent with assistive device   Problem: Sit to Stand Goal: LTG:  Patient will perform sit to stand in prep for activites of daily living with assistance level (OT) Description: LTG:  Patient will perform sit to stand in prep for activites of daily living with assistance level (OT) Flowsheets (Taken 01/24/2023 1224) LTG: PT will perform sit to stand in prep for activites of daily living with assistance level: Independent with assistive device   Problem: RH Bathing Goal: LTG Patient will bathe all body parts with assist levels (OT) Description: LTG: Patient will bathe all body parts with assist levels (OT) Flowsheets (Taken 01/24/2023 1224) LTG: Pt will perform bathing with assistance level/cueing: Independent with assistive device    Problem: RH Dressing Goal: LTG Patient will perform lower body dressing w/assist (OT) Description: LTG: Patient will perform lower body dressing with assist, with/without cues in positioning using equipment (OT) Flowsheets (Taken 01/24/2023 1224) LTG: Pt will perform lower body dressing with assistance level of: Independent with assistive device   Problem: RH Toileting Goal: LTG Patient will perform toileting task (3/3 steps) with assistance level (OT) Description: LTG: Patient will perform toileting task (3/3 steps) with assistance level (OT)  Flowsheets (Taken 01/24/2023 1224) LTG: Pt will perform toileting task (3/3 steps) with assistance level: Independent with assistive device   Problem: RH Simple Meal Prep Goal: LTG Patient will perform simple meal prep w/assist (OT) Description: LTG: Patient will perform simple meal prep with assistance,  with/without cues (OT). Flowsheets (Taken 01/24/2023 1224) LTG: Pt will perform simple meal prep with assistance level of: Independent with assistive device   Problem: RH Laundry Goal: LTG Patient will perform laundry w/assist, cues (OT) Description: LTG: Patient will perform laundry with assistance, with/without cues (OT). Flowsheets (Taken 01/24/2023 1224) LTG: Pt will perform laundry with assistance level of: Independent with assistive device   Problem: RH Toilet Transfers Goal: LTG Patient will perform toilet transfers w/assist (OT) Description: LTG: Patient will perform toilet transfers with assist, with/without cues using equipment (OT) Flowsheets (Taken 01/24/2023 1224) LTG: Pt will perform toilet transfers with assistance level of: Independent with assistive device   Problem: RH Tub/Shower Transfers Goal: LTG Patient will perform tub/shower transfers w/assist (OT) Description: LTG: Patient will perform tub/shower transfers with assist, with/without cues using equipment (OT) Flowsheets (Taken 01/24/2023 1224) LTG: Pt will perform tub/shower stall transfers with assistance level of: Independent with assistive device

## 2023-01-24 NOTE — Progress Notes (Signed)
PROGRESS NOTE   Subjective/Complaints:  Per LPN, pt not getting relief of severe stump pain RLE Pt also c/o neurogenic pain (phantom pain)  Reviewed MAR,  Feels tight right thigh, no relief with methocarbamol   ROS- neg CP, SOB,  N/V/D, + poor appetite Objective:   DG Chest Port 1 View Result Date: 01/23/2023 CLINICAL DATA:  Vasculitis. EXAM: PORTABLE CHEST 1 VIEW COMPARISON:  05/26/2022 FINDINGS: The lungs are clear without focal pneumonia, edema, pneumothorax or pleural effusion. The cardiopericardial silhouette is within normal limits for size. No acute bony abnormality. IMPRESSION: No active disease. Electronically Signed   By: Kennith Center M.D.   On: 01/23/2023 14:55   Recent Labs    01/23/23 0456 01/24/23 0522  WBC 13.3* 13.8*  HGB 8.0* 7.1*  HCT 27.6* 24.8*  PLT 275 257   Recent Labs    01/23/23 0456 01/24/23 0522  NA 134* 137  K 4.8 4.5  CL 100 104  CO2 23 24  GLUCOSE 125* 90  BUN 22* 14  CREATININE 1.11* 1.39*  CALCIUM 8.9 8.9    Intake/Output Summary (Last 24 hours) at 01/24/2023 2725 Last data filed at 01/23/2023 1800 Gross per 24 hour  Intake 240 ml  Output --  Net 240 ml        Physical Exam: Vital Signs Blood pressure 131/87, pulse 89, temperature 98.7 F (37.1 C), resp. rate 17, height 5\' 7"  (1.702 m), weight 84.5 kg, SpO2 97%.   General: No acute distress Mood and affect are appropriate Heart: Regular rate and rhythm no rubs murmurs or extra sounds Lungs: Clear to auscultation, breathing unlabored, no rales or wheezes Abdomen: Positive bowel sounds, soft nontender to palpation, nondistended Extremities: No clubbing, cyanosis, or edema Skin: No evidence of breakdown, no evidence of rash RIght BKA incision CDI, mild ecchymosis distally  Neurologic: Cranial nerves II through XII intact, motor strength is 5/5 in bilateral deltoid, bicep, tricep, grip, hip flexor, knee extensors, ankle  dorsiflexor and plantar flexor  Musculoskeletal: Right BKA  No joint swelling   Assessment/Plan: 1. Functional deficits which require 3+ hours per day of interdisciplinary therapy in a comprehensive inpatient rehab setting. Physiatrist is providing close team supervision and 24 hour management of active medical problems listed below. Physiatrist and rehab team continue to assess barriers to discharge/monitor patient progress toward functional and medical goals  Care Tool:  Bathing              Bathing assist       Upper Body Dressing/Undressing Upper body dressing        Upper body assist      Lower Body Dressing/Undressing Lower body dressing            Lower body assist       Toileting Toileting    Toileting assist       Transfers Chair/bed transfer  Transfers assist           Locomotion Ambulation   Ambulation assist              Walk 10 feet activity   Assist           Walk 50 feet activity  Assist           Walk 150 feet activity   Assist           Walk 10 feet on uneven surface  activity   Assist           Wheelchair     Assist               Wheelchair 50 feet with 2 turns activity    Assist            Wheelchair 150 feet activity     Assist          Blood pressure 131/87, pulse 89, temperature 98.7 F (37.1 C), resp. rate 17, height 5\' 7"  (1.702 m), weight 84.5 kg, SpO2 97%.  Medical Problem List and Plan: 1. Functional deficits secondary to R BKA PAD             -patient may shower if RLE covered             -ELOS/Goals: 9-12 days, PT/OT mod I             -Admit to CIR 2.  Antithrombotics: -DVT/anticoagulation:  Pharmaceutical: Eliquis             -antiplatelet therapy: N/A 3. Pain Management:  Dilaudid 2mg   prn for severe pain,  hydrocodone 10/650 mg prn for severe pain --On robaxin 1000 mg TID does not seen to help will use  change to  flexeril TID---On   gabapentin 400 mg TID-->increase to QID for neuropathic symptoms 4. Mood/Behavior/Sleep:  LCSW to follow for evaluation and support.              -antipsychotic agents: N/A 5. Neuropsych/cognition: This patient is capable of making decisions on her own behalf. 6. Skin/Wound Care: Routine pressure relief measures.  7. Fluids/Electrolytes/Nutrition: Monitor I/O. Check CMET in am 8.  H/o A flutter/PAF: Monitor HR TID--continue Cardizem and Apixaban             --monitor for symptoms with increase in activity.  9. T1DM: Hgb A1c-9.2 and blood sugars poorly controlled. BS 800 during August hospitalization.              --monitor BS ac/hs/3 am and use SSI for elevated BS. Will change meal coverage to [facility] b'fast/lunch as she cotinues to drop at late afternoons. May drop further with 3.5 hrs of therapy.  Pt was seen by diabetes coordinator.             --Continue Insulin glargline bid--reports compliance at home.  CBG (last 3)  Recent Labs    01/23/23 2131 01/24/23 0356 01/24/23 0559  GLUCAP 125* 85 124*  Mild hypoglycemic episode 65 just now but overall looking great , eating lunch   10. Hyponatremia: Improved from 127 @ admission to 134             --Monitor for now 11. CKD?:  Per chart review SCr-1.56 on 08/24 and  1.33 @ admission.              --Monitor renal status with serial checks. Avoid nephrotoxic medications.  12. Acute on chronic  iron deficiency anemia:  Has been transfused with 3 units PRBCs.   --Add iron due to severe iron deficiency --Continue to monitor H&H for stability.    --hx of menorrhagia/Fibroids-->on depo with good results PTA but has had recurrance w/clots past 3 days.     Latest Ref Rng & Units 01/24/2023  5:22 AM 01/23/2023    4:56 AM 01/22/2023    5:27 AM  CBC  WBC 4.0 - 10.5 K/uL 13.8  13.3  13.3   Hemoglobin 12.0 - 15.0 g/dL 7.1  8.0  7.4   Hematocrit 36.0 - 46.0 % 24.8  27.6  26.0   Platelets 150 - 400 K/uL 257  275  267    Transfuse if <7.0 13.  Leukocytosis: Monitor for fevers and other signs of infection. 14. Obesity. BMI 34 on admission. Dietary counseling 15. Constipation. Miralax, senokot. 16. Hyperkalemia. Monitor labs    Latest Ref Rng & Units 01/24/2023    5:22 AM 01/23/2023    4:56 AM 01/22/2023    5:27 AM  BMP  Glucose 70 - 99 mg/dL 90  284  132   BUN 6 - 20 mg/dL 14  22  28    Creatinine 0.44 - 1.00 mg/dL 4.40  1.02  7.25   Sodium 135 - 145 mmol/L 137  134  133   Potassium 3.5 - 5.1 mmol/L 4.5  4.8  5.5   Chloride 98 - 111 mmol/L 104  100  104   CO2 22 - 32 mmol/L 24  23  21    Calcium 8.9 - 10.3 mg/dL 8.9  8.9  8.7     17. HTN. Lisinopril and hydrochlorothiazide have been stopped.    Vitals:   01/23/23 1947 01/24/23 0416  BP: (!) 142/84 131/87  Pulse: 94 89  Resp: 17 17  Temp: 98.2 F (36.8 C) 98.7 F (37.1 C)  SpO2: 98% 97%       LOS: 1 days A FACE TO FACE EVALUATION WAS PERFORMED  Erick Colace 01/24/2023, 7:14 AM

## 2023-01-24 NOTE — Evaluation (Signed)
Physical Therapy Assessment and Plan  Patient Details  Name: Jennifer [name] MRN: [id number] Date of Birth: 1980/12/20  PT Diagnosis: Abnormal posture, Abnormality of gait, Contracture of joint: R knee, Difficulty walking, Edema, Impaired sensation, Muscle spasms, Muscle weakness, and Pain in in R residual limb Rehab Potential: Good ELOS: 7-10 days   Today's Date: 01/24/2023 PT Individual Time: 0830-0940 PT Individual Time Calculation (min): 70 min    Hospital Problem: Principal Problem:   Unilateral complete BKA, left, sequela (HCC)   Past Medical History:  Past Medical History:  Diagnosis Date   Abnormal uterine bleeding    Due to fibriods/required transfusions   Atrial fibrillation with RVR (HCC) 05/2022   underwent cardioversion by Dr. Mariah Milling   Closed fracture of coronoid process of right ulna 2011   Diabetes mellitus without complication (HCC)    Headache    Hypertension    Menorrhagia    OSA (obstructive sleep apnea)    Past Surgical History:  Past Surgical History:  Procedure Laterality Date   AMPUTATION Right 01/17/2023   Procedure: AMPUTATION BELOW KNEE;  Surgeon: Iline Oven, MD;  Location: [facility] ORS;  Service: Vascular;  Laterality: Right;   CESAREAN SECTION     x2   LOWER EXTREMITY ANGIOGRAPHY Right 01/15/2023   Procedure: Lower Extremity Angiography;  Surgeon: Annice Needy, MD;  Location: [facility] INVASIVE CV LAB;  Service: Cardiovascular;  Laterality: Right;   none      Assessment & Plan Clinical Impression: Patient is a 42 y.o. year old female with history of T1DM, HTN, A flutter, ongoing tobacco use who was admitted on [facility] on 01/15/23 with 2 day history of RLE pain with coldness, numbness and difficulty walking. She was found to have ischemic RLE, lack of DP/PT on dopplers, was anemic with hemoglobin 7.5 and BS 602 at admission. She was started on IV heparin and  taken to OR for aortogram with mechanical thrombectomy to right anterior tibial artery and stent  placement and right popliteal artery on the same day by Dr. Wyn Quaker.  She  had drop in hemoglobin to 5.9 and was transfused with 2 units PRBC.  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 Wallace Cullens.  Postop pain continue to be an issue.  She was tried on lyrica but then this was changed back to gabapentin. Phantom pain is under control but she continues to have pain at residual limb. She required additional units PRBC on 12/14 with most recent CBC revealing H&H at 8.0 and 27.6.   Patient currently requires min with mobility secondary to muscle weakness and muscle joint tightness, decreased cardiorespiratoy endurance, and decreased standing balance, decreased postural control, decreased balance strategies, and difficulty maintaining precautions.  Prior to hospitalization, patient was independent  with mobility and lived with Spouse, Other (Comment) (74 y/o and 30 y/o) in a House home.  Home access is 4Stairs to enter.  Patient will benefit from skilled PT intervention to maximize safe functional mobility, minimize fall risk, and decrease caregiver burden for planned discharge home with intermittent assist.  Anticipate patient will benefit from follow up OP at discharge.  PT - End of Session Activity Tolerance: Tolerates 30+ min activity with multiple rests Endurance Deficit: Yes Endurance Deficit Description: required frequent rest breaks PT Assessment Rehab Potential (ACUTE/IP ONLY): Good PT Barriers to Discharge: Inaccessible home environment;Decreased caregiver support;Home environment access/layout;Wound Care;Weight bearing restrictions;Other (comments) PT Barriers to Discharge Comments: pain, 4 STE with R  handrail, spouse works during the day PT Patient demonstrates impairments in the following area(s): Balance;Edema;Endurance;Skin Integrity;Pain;Sensory;Nutrition;Motor PT Transfers Functional Problem(s): Bed Mobility;Bed to Chair;Car;Furniture PT  Locomotion Functional Problem(s): Ambulation;Wheelchair Mobility;Stairs PT Plan PT Intensity: Minimum of 1-2 x/day ,45 to 90 minutes PT Frequency: 5 out of 7 days PT Duration Estimated Length of Stay: 7-10 days PT Treatment/Interventions: Ambulation/gait training;Discharge planning;Functional mobility training;Psychosocial support;Therapeutic Activities;Balance/vestibular training;Disease management/prevention;Neuromuscular re-education;Skin care/wound management;Therapeutic Exercise;Wheelchair propulsion/positioning;DME/adaptive equipment instruction;Pain management;Splinting/orthotics;UE/LE Strength taining/ROM;Community reintegration;Patient/family education;Stair training;UE/LE Coordination activities;Functional electrical stimulation PT Transfers Anticipated Outcome(s): Mod I with LRAD PT Locomotion Anticipated Outcome(s): Mod I with LRAD PT Recommendation Recommendations for Other Services: Neuropsych consult Follow Up Recommendations: Outpatient PT Patient destination: Home Equipment Recommended: To be determined Equipment Details: has none   PT Evaluation Precautions/Restrictions Precautions Precautions: Fall Restrictions Weight Bearing Restrictions Per Provider Order: Yes RLE Weight Bearing Per Provider Order: Non weight bearing Pain Interference Pain Interference Pain Effect on Sleep: 3. Frequently Pain Interference with Therapy Activities: 1. Rarely or not at all Pain Interference with Day-to-Day Activities: 1. Rarely or not at all Home Living/Prior Functioning Home Living Available Help at Discharge: Family;Available PRN/intermittently (spouse works 12 hour shifts during day, but 44 y/o is home during day) Type of Home: House Home Access: Stairs to enter Secretary/administrator of Steps: 4 Entrance Stairs-Rails: Right Home Layout: One level Bathroom Shower/Tub: Forensic scientist: Standard Bathroom Accessibility: Yes Additional Comments: has no  equipment  Lives With: Spouse;Other (Comment) (24 y/o and 28 y/o) Prior Function Level of Independence: Independent with basic ADLs;Independent with gait;Independent with homemaking with ambulation;Independent with transfers  Able to Take Stairs?: Yes Driving: Yes Vocation: Full time employment Vocation Requirements: Social research officer, government Vision/Perception  Vision - History Ability to See in Adequate Light: 0 Adequate Perception Perception: Within Functional Limits Praxis Praxis: WFL  Cognition Overall Cognitive Status: Within Functional Limits for tasks assessed Arousal/Alertness: Awake/alert Orientation Level: Oriented X4 Memory: Appears intact Awareness: Appears intact Problem Solving: Impaired Safety/Judgment: Appears intact Sensation Sensation Light Touch: Impaired Detail Light Touch Impaired Details: Impaired RLE Hot/Cold: Not tested Proprioception: Impaired by gross assessment Stereognosis: Not tested Additional Comments: absent sensation along distal aspect of R residual limb Coordination Gross Motor Movements are Fluid and Coordinated: No Fine Motor Movements are Fluid and Coordinated: Yes Coordination and Movement Description: altered balance strategies due to R BKA Finger Nose Finger Test: [facility] bilaterally Heel Shin Test: unable to perform due to R BKA Motor  Motor Motor: Abnormal postural alignment and control Motor - Skilled Clinical Observations: altered balance strategies due to R BKA  Trunk/Postural Assessment  Cervical Assessment Cervical Assessment: Within Functional Limits Thoracic Assessment Thoracic Assessment: Within Functional Limits Lumbar Assessment Lumbar Assessment: Exceptions to [facility] (posterior pelvic tilt) Postural Control Postural Control: Deficits on evaluation Righting Reactions: delayed on R due to BKA Protective Responses: delayed on R due to BKA  Balance Balance Balance Assessed: Yes Static Sitting Balance Static Sitting - Balance  Support: Feet supported;Bilateral upper extremity supported Static Sitting - Level of Assistance: 7: Independent Dynamic Sitting Balance Dynamic Sitting - Balance Support: Feet supported;No upper extremity supported Dynamic Sitting - Level of Assistance: 6: Modified independent (Device/Increase time) Static Standing Balance Static Standing - Balance Support: Bilateral upper extremity supported;During functional activity (RW) Static Standing - Level of Assistance: 4: Min assist Dynamic Standing Balance Dynamic Standing - Balance Support: Bilateral upper extremity supported;During functional activity (RW) Dynamic Standing - Level of Assistance: 4: Min assist Dynamic Standing - Comments: with transfers and gait Extremity Assessment  RLE Assessment RLE Assessment: Exceptions to [facility] Active Range of Motion (AROM) Comments: mild knee flexion contracture noted General Strength Comments: tested sitting EOB RLE Strength Right Hip Flexion: 3-/5 Right Hip ABduction: 3/5 Right Hip ADduction: 3/5 Right Knee Flexion: 3-/5 Right Knee Extension: 3-/5 LLE Assessment LLE Assessment: Exceptions to [facility] General Strength Comments: tested sitting EOB LLE Strength Left Hip Flexion: 4-/5 Left Hip ABduction: 4-/5 Left Hip ADduction: 4-/5 Left Knee Flexion: 4-/5 Left Knee Extension: 4-/5 Left Ankle Dorsiflexion: 4/5 Left Ankle Plantar Flexion: 4/5  Care Tool Care Tool Bed Mobility Roll left and right activity   Roll left and right assist level: Supervision/Verbal cueing    Sit to lying activity   Sit to lying assist level: Supervision/Verbal cueing    Lying to sitting on side of bed activity   Lying to sitting on side of bed assist level: the ability to move from lying on the back to sitting on the side of the bed with no back support.: Supervision/Verbal cueing     Care Tool Transfers Sit to stand transfer   Sit to stand assist level: Minimal Assistance - Patient > 75%    Chair/bed transfer    Chair/bed transfer assist level: Minimal Assistance - Patient > 75%    Car transfer Car transfer activity did not occur: Safety/medical concerns (pain)        Care Tool Locomotion Ambulation Ambulation activity did not occur: Safety/medical concerns (pain)        Walk 10 feet activity Walk 10 feet activity did not occur: Safety/medical concerns (pain)       Walk 50 feet with 2 turns activity Walk 50 feet with 2 turns activity did not occur: Safety/medical concerns (pain)      Walk 150 feet activity Walk 150 feet activity did not occur: Safety/medical concerns (pain)      Walk 10 feet on uneven surfaces activity Walk 10 feet on uneven surfaces activity did not occur: Safety/medical concerns (pain)      Stairs Stair activity did not occur: Safety/medical concerns (pain)        Walk up/down 1 step activity Walk up/down 1 step or curb (drop down) activity did not occur: Safety/medical concerns (pain)      Walk up/down 4 steps activity Walk up/down 4 steps activity did not occur: Safety/medical concerns (pain)      Walk up/down 12 steps activity Walk up/down 12 steps activity did not occur: Safety/medical concerns (pain)      Pick up small objects from floor Pick up small object from the floor (from standing position) activity did not occur: Safety/medical concerns (pain)      Wheelchair Is the patient using a wheelchair?: Yes Type of Wheelchair: Manual   Wheelchair assist level: Supervision/Verbal cueing Max wheelchair distance: 144ft  Wheel 50 feet with 2 turns activity   Assist Level: Supervision/Verbal cueing  Wheel 150 feet activity   Assist Level: Supervision/Verbal cueing    Refer to Care Plan for Long Term Goals  SHORT TERM GOAL WEEK 1 PT Short Term Goal 1 (Week 1): STG=LTG due to LOS  Recommendations for other services: Neuropsych  Skilled Therapeutic Intervention Evaluation completed (see details above and below) with education on PT POC and goals and  individual treatment initiated with focus on functional mobility/transfers, generalized strengthening and endurance, dynamic standing balance/coordination, toileting, and limb loss education. Received pt semi-reclined in bed, pt educated on PT evaluation, CIR policies, and therapy schedule and agreeable. Pt reported pain 10/10 in R  residual limb - RN notified and present to administer medication. Educated pt on desensitization techniques and importance of maintaining knee extension for further contracture prevention (pt does have mild knee flexion contracture currently).  Provided pt with 18x18 manual WC, R amputee support pad, and RW. Noted no limb protector or shrinker in room and reached out to MD for order, pt also interested in grounds pass. Ace wrap placed below the knee creating tourniquet - therefore removed ace wrap and gauze and rewrapped limb above the knee. Pt transferred semi-reclined<>sitting R EOB with HOB elevated and use of bedrails with supervision. Stood with RW and light min A and performed stand<>pivot into WC with RW and min A. Pt reported urge to void and transferred to/from toilet with bedside commode over top via stand<>pivot using grab bar and CGA/min A. Pt able to manage clothing, void, and perform hygiene management without assist - CGA for balance in standing. Pt performed WC mobility 172ft using BUE and supervision to main therapy gym - limited by UE fatigue and transported back to room dependently. Pt requested to return to bed and transferred WC<>bed stand<>pivot using bedrail with min A (cues for technique) and transferred into semi-reclined with supervision. Concluded session with pt semi-reclined in bed, needs within reach, and bed alarm on. Safety plan updated.   Mobility Bed Mobility Bed Mobility: Rolling Right;Rolling Left;Sit to Supine;Supine to Sit Rolling Right: Supervision/verbal cueing Rolling Left: Supervision/Verbal cueing Supine to Sit: Supervision/Verbal  cueing Sit to Supine: Supervision/Verbal cueing Transfers Transfers: Sit to Stand;Stand to Sit;Stand Pivot Transfers;Squat Pivot Transfers Sit to Stand: Minimal Assistance - Patient > 75% Stand to Sit: Contact Guard/Touching assist Stand Pivot Transfers: Minimal Assistance - Patient > 75% Stand Pivot Transfer Details: Verbal cues for sequencing;Verbal cues for technique Stand Pivot Transfer Details (indicate cue type and reason): cues for technique Squat Pivot Transfers: Minimal Assistance - Patient > 75% Transfer (Assistive device): Rolling walker Locomotion  Gait Ambulation: No Gait Gait: No Stairs / Additional Locomotion Stairs: No Wheelchair Mobility Wheelchair Mobility: Yes Wheelchair Assistance: Doctor, general practice: Both upper extremities Wheelchair Parts Management: Needs assistance Distance: 171ft   Discharge Criteria: Patient will be discharged from PT if patient refuses treatment 3 consecutive times without medical reason, if treatment goals not met, if there is a change in medical status, if patient makes no progress towards goals or if patient is discharged from hospital.  The above assessment, treatment plan, treatment alternatives and goals were discussed and mutually agreed upon: by patient  Huntley Dec PT, DPT 01/24/2023, 12:11 PM

## 2023-01-24 NOTE — Progress Notes (Signed)
Occupational Therapy Session Note  Patient Details  Name: Jennifer [name] MRN: [id number] Date of Birth: [birth year]  Today's Date: 01/24/2023 OT Individual Time: 1430-1505 OT Individual Time Calculation (min): 35 min  OT missed time: 25 min Missed time reason: pain, fatigue, toileting    Short Term Goals: Week 1:  OT Short Term Goal 1 (Week 1): STG = LTG due to ELOS  Skilled Therapeutic Interventions/Progress Updates:  Skilled OT intervention completed with focus on limb care, and functional transfers. Pt received upright in bed, in "excruciating pain" (unrated) on Rt residual limb. Limb noted to be uncovered resting on the bed. Per nursing, pt pre-medicated, however was instructed to use desensitization techniques to help manage pain.  Retrieved skin inspected mirror. Education provided on technique for self-inspection, with cues needed to locate posterior and lateral views of limb. Discussed in length, signs of infection and poor wound healing as well as infection prevention methods. OT observed 1 area of blistering on Rt lateral (above incision) aspect and on the base of the stump however no active blood drainage; nursing made aware.   With gentle coaxing, pt agreeable to recover limb for edema management to help alleviate pressure/pain currently felt and in prep for shaping for her personal goal of a prosthesis. Pt sat EOB during application of xeroform, gauze, then 2 ACE wraps (per nursing order) in a figure 8 pattern for even pressure. Pt tolerated well and verbalized comfort with the wrapped method.   CGA sit > stand from elevated bed using RW, CGA stand pivot with RW > w/c, supervision self-propulsion > BSC, then CGA stand pivot with grab bar > BSC over toilet and CGA doffing of pants prior to sitting. Pt requested privacy for trial at [facility], and verbalized fatigue/pain, therefore Pt missed 25 mins of OT intervention; OT will make up missed time as able. NT notified of pt status. Family  present and all needs met at end of session.    Therapy Documentation Precautions:  Precautions Precautions: Fall Restrictions Weight Bearing Restrictions Per Provider Order: Yes RLE Weight Bearing Per Provider Order: Non weight bearing   Therapy/Group: Individual Therapy  Melvyn Novas, MS, OTR/L  01/24/2023, 3:17 PM

## 2023-01-24 NOTE — Evaluation (Signed)
Occupational Therapy Assessment and Plan  Patient Details  Name: MAHDIA KOR MRN: [id number] Date of Birth: [birth year]  OT Diagnosis: acute pain, muscle weakness (generalized), and swelling of limb Rehab Potential: Rehab Potential (ACUTE ONLY): Good ELOS: 7-10 days   Today's Date: 01/24/2023 OT Individual Time: 1050-1200 OT Individual Time Calculation (min): 70 min     Hospital Problem: Principal Problem:   Unilateral complete BKA, left, sequela (HCC)   Past Medical History:  Past Medical History:  Diagnosis Date   Abnormal uterine bleeding    Due to fibriods/required transfusions   Atrial fibrillation with RVR (HCC) 05/2022   underwent cardioversion by Dr. Mariah Milling   Closed fracture of coronoid process of right ulna 2011   Diabetes mellitus without complication (HCC)    Headache    Hypertension    Menorrhagia    OSA (obstructive sleep apnea)    Past Surgical History:  Past Surgical History:  Procedure Laterality Date   AMPUTATION Right 01/17/2023   Procedure: AMPUTATION BELOW KNEE;  Surgeon: Iline Oven, MD;  Location: [facility] ORS;  Service: Vascular;  Laterality: Right;   CESAREAN SECTION     x2   LOWER EXTREMITY ANGIOGRAPHY Right 01/15/2023   Procedure: Lower Extremity Angiography;  Surgeon: Annice Needy, MD;  Location: [facility] INVASIVE CV LAB;  Service: Cardiovascular;  Laterality: Right;   none      Assessment & Plan Clinical Impression:  Aissata R. Abshier is a 42 year old female with history of T1DM, HTN, A flutter, ongoing tobacco use who was admitted on [facility] on 01/15/23 with 2 day history of RLE pain with coldness, numbness and difficulty walking. She was found to have ischemic RLE, lack of DP/PT on dopplers, was anemic with hemoglobin 7.5 and BS 602 at admission. She was started on IV heparin and  taken to OR for aortogram with mechanical thrombectomy to right anterior tibial artery and stent placement and right popliteal artery on the same day by Dr. Wyn Quaker.  She  had  drop in hemoglobin to 5.9 and was transfused with 2 units PRBC.  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 Wallace Cullens.  Postop pain continue to be an issue.  She was tried on lyrica but then this was changed back to gabapentin. Phantom pain is under control but she continues to have pain at residual limb. She required additional units PRBC on 12/14 with most recent CBC revealing H&H at 8.0 and 27.6.  Patient transferred to CIR on 01/23/2023 .    Patient currently requires min with basic self-care skills secondary to muscle weakness, decreased cardiorespiratoy endurance, decreased coordination, and decreased standing balance and difficulty maintaining precautions.  Prior to hospitalization, patient could complete ADLs with independent .  Patient will benefit from skilled intervention to increase independence with basic self-care skills and increase level of independence with iADL prior to discharge home with care partner.  Anticipate patient will be Mod I and follow up outpatient.  OT - End of Session Activity Tolerance: Tolerates 10 - 20 min activity with multiple rests Endurance Deficit: Yes Endurance Deficit Description: fatigue after prolonged standing with ADLs OT Assessment Rehab Potential (ACUTE ONLY): Good OT Barriers to Discharge: Home environment access/layout;Inaccessible home environment;Weight bearing restrictions;Wound Care OT Patient demonstrates impairments in the following area(s): Balance;Edema;Endurance;Motor;Pain;Skin Integrity;Sensory OT Basic ADL's Functional Problem(s): Bathing;Dressing;Toileting OT Advanced ADL's Functional Problem(s): Simple Meal Preparation OT Transfers Functional Problem(s): Toilet;Tub/Shower OT Additional Impairment(s): None OT Plan OT  Intensity: Minimum of 1-2 x/day, 45 to 90 minutes OT Frequency: 5 out of 7 days OT Duration/Estimated Length of Stay: 7-10 days OT Treatment/Interventions: Balance/vestibular  training;Discharge planning;Pain management;Self Care/advanced ADL retraining;Therapeutic Activities;UE/LE Coordination activities;Disease mangement/prevention;Functional mobility training;Skin care/wound managment;Patient/family education;Therapeutic Exercise;DME/adaptive equipment instruction;Neuromuscular re-education;Psychosocial support;UE/LE Strength taining/ROM;Wheelchair propulsion/positioning OT Self Feeding Anticipated Outcome(s): Independent OT Basic Self-Care Anticipated Outcome(s): Mod I OT Toileting Anticipated Outcome(s): Mod I OT Bathroom Transfers Anticipated Outcome(s): Mod I OT Recommendation Recommendations for Other Services: Neuropsych consult;Therapeutic Recreation consult Therapeutic Recreation Interventions: Pet therapy;Kitchen group Patient destination: Home Follow Up Recommendations: Outpatient OT Equipment Recommended: To be determined   OT Evaluation Precautions/Restrictions  Precautions Precautions: Fall Restrictions Weight Bearing Restrictions Per Provider Order: Yes RLE Weight Bearing Per Provider Order: Non weight bearing Home Living/Prior Functioning Home Living Family/patient expects to be discharged to:: Private residence Living Arrangements: Children, Spouse/significant other Available Help at Discharge: Family, Available PRN/intermittently (spouse works 12 hour shifts during day, but 44 y/o is home during day) Type of Home: House Home Access: Stairs to enter Secretary/administrator of Steps: 4 Entrance Stairs-Rails: Right Home Layout: One level Bathroom Shower/Tub: Tub/shower unit, Buyer, retail: Yes Additional Comments: has no equipment  Lives With: Spouse, Other (Comment) (81 y/o and 28 y/o) IADL History Homemaking Responsibilities: Yes Meal Prep Responsibility: Primary Laundry Responsibility: Primary Current License: Yes Occupation: On disability, Unemployed Type of Occupation: 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, government but unemployed since, Press photographer for disability Leisure and Hobbies: singing, dancing, cooking Prior Function Level of Independence: Independent with basic ADLs, Independent with gait, Independent with homemaking with ambulation, Independent with transfers  Able to Take Stairs?: Yes Driving: Yes Vocation: Full time employment Glass blower/designer Baseline Vision/History: 1 Wears glasses Ability to See in Adequate Light: 0 Adequate Patient Visual Report: No change from baseline Vision Assessment?: No apparent visual deficits Perception  Perception: Within Functional Limits Praxis Praxis: WFL Cognition Cognition Overall Cognitive Status: Within Functional Limits for tasks assessed Arousal/Alertness: Awake/alert Orientation Level: Person;Place;Situation Person: Oriented Place: Oriented Situation: Oriented Memory: Appears intact Awareness: Appears intact Problem Solving: Impaired Safety/Judgment: Appears intact Brief Interview for Mental Status (BIMS) Repetition of Three Words (First Attempt): 3 Temporal Orientation: Year: Correct Temporal Orientation: Month: Accurate within 5 days Temporal Orientation: Day: Correct Recall: "Sock": Yes, no cue required Recall: "Blue": Yes, no cue required Recall: "Bed": Yes, no cue required BIMS Summary Score: 15 Sensation Sensation Light Touch: Impaired Detail Light Touch Impaired Details: Impaired RLE Hot/Cold: Not tested Proprioception: Impaired by gross assessment Stereognosis: Not tested Additional Comments: absent sensation along distal aspect of R residual limb Coordination Gross Motor Movements are Fluid and Coordinated: No Fine Motor Movements are Fluid and Coordinated: Yes Coordination and Movement Description: altered balance strategies due to R BKA Finger Nose Finger Test: [facility] bilaterally Heel Shin Test: unable to perform due to R BKA Motor  Motor Motor: Abnormal postural alignment and  control Motor - Skilled Clinical Observations: altered balance strategies due to R BKA  Trunk/Postural Assessment  Cervical Assessment Cervical Assessment: Within Functional Limits Thoracic Assessment Thoracic Assessment: Within Functional Limits Lumbar Assessment Lumbar Assessment: Exceptions to [facility] (posterior pelvic tilt) Postural Control Postural Control: Deficits on evaluation Righting Reactions: delayed on R due to BKA Protective Responses: delayed on R due to BKA  Balance Balance Balance Assessed: Yes Static Sitting Balance Static Sitting - Balance Support: Feet supported;Bilateral upper extremity supported Static Sitting - Level of Assistance: 7: Independent Dynamic Sitting Balance Dynamic Sitting - Balance  Support: Feet supported;No upper extremity supported Dynamic Sitting - Level of Assistance: 6: Modified independent (Device/Increase time) Static Standing Balance Static Standing - Balance Support: Bilateral upper extremity supported;During functional activity (RW) Static Standing - Level of Assistance: 4: Min assist Dynamic Standing Balance Dynamic Standing - Balance Support: Bilateral upper extremity supported;During functional activity (RW) Dynamic Standing - Level of Assistance: 4: Min assist Dynamic Standing - Comments: with transfers and gait Extremity/Trunk Assessment RUE Assessment RUE Assessment: Within Functional Limits LUE Assessment LUE Assessment: Within Functional Limits  Care Tool Care Tool Self Care Eating   Eating Assist Level: Independent    Oral Care    Oral Care Assist Level: Independent    Bathing   Body parts bathed by patient: Right arm;Left arm;Chest;Abdomen;Front perineal area;Buttocks;Right upper leg;Left upper leg;Face Body parts bathed by helper: Left lower leg Body parts n/a: Right lower leg Assist Level: Minimal Assistance - Patient > 75%    Upper Body Dressing(including orthotics)   What is the patient wearing?: Pull over  shirt   Assist Level: Independent    Lower Body Dressing (excluding footwear)   What is the patient wearing?: Pants;Underwear/pull up Assist for lower body dressing: Minimal Assistance - Patient > 75%    Putting on/Taking off footwear   What is the patient wearing?: Non-skid slipper socks Assist for footwear: Total Assistance - Patient < 25%       Care Tool Toileting Toileting activity   Assist for toileting: Minimal Assistance - Patient > 75%     Care Tool Bed Mobility Roll left and right activity   Roll left and right assist level: Supervision/Verbal cueing    Sit to lying activity   Sit to lying assist level: Supervision/Verbal cueing    Lying to sitting on side of bed activity   Lying to sitting on side of bed assist level: the ability to move from lying on the back to sitting on the side of the bed with no back support.: Supervision/Verbal cueing     Care Tool Transfers Sit to stand transfer   Sit to stand assist level: Minimal Assistance - Patient > 75%    Chair/bed transfer   Chair/bed transfer assist level: Minimal Assistance - Patient > 75%     Toilet transfer   Assist Level: Minimal Assistance - Patient > 75%     Care Tool Cognition  Expression of Ideas and Wants Expression of Ideas and Wants: 4. Without difficulty (complex and basic) - expresses complex messages without difficulty and with speech that is clear and easy to understand  Understanding Verbal and Non-Verbal Content Understanding Verbal and Non-Verbal Content: 4. Understands (complex and basic) - clear comprehension without cues or repetitions   Memory/Recall Ability Memory/Recall Ability : Current season;That he or she is in a hospital/hospital unit   Refer to Care Plan for Long Term Goals  SHORT TERM GOAL WEEK 1 OT Short Term Goal 1 (Week 1): STG = LTG due to ELOS  Recommendations for other services: Neuropsych and Therapeutic Recreation  Pet therapy and Kitchen group   Skilled Therapeutic  Intervention Patient received upright in bed upon therapy arrival and agreeable to participate in OT evaluation. Education provided on OT purpose, therapy schedule, goals for therapy, and safety policy while in rehab. 20/10 pain reported in Rt residual limb; pre-medicated. Asked nursing for additional pain med request per pt however pt unable to receive further meds. OT offered ice (pt declined), rest breaks, repositioning and distraction for pain reduction.  Patient demonstrates functional  endurance, dynamic balance and GM coordination deficits, along with high pain levels (including phantom pain) resulting in difficulty completing BADL tasks without increased physical assist. Cues needed throughout for body positioning to avoid WB through Rt residual limb, for optimal contracture/pain prevention positioning, and safety with AD. Pt will benefit from skilled OT services to focus on mentioned deficits. See below for ADL and functional transfer performance. ADLs completed at sink level and stand pivots with RW or grab bar. Pt continent of urinary void on toilet. Pt remained upright in bed at conclusion of session with bed alarm on and all needs met at end of session.    ADL ADL Eating: Independent Where Assessed-Eating: Edge of bed Grooming: Independent Where Assessed-Grooming: Sitting at sink Upper Body Bathing: Independent Where Assessed-Upper Body Bathing: Sitting at sink Lower Body Bathing: Minimal assistance Where Assessed-Lower Body Bathing: Sitting at sink;Standing at sink Upper Body Dressing: Independent Where Assessed-Upper Body Dressing: Sitting at sink Lower Body Dressing: Minimal assistance Where Assessed-Lower Body Dressing: Sitting at sink;Standing at sink Toileting: Minimal assistance Where Assessed-Toileting: Teacher, adult education: Curator Method: Surveyor, minerals: Raised toilet seat;Grab bars Tub/Shower Transfer: Unable to  assess Tub/Shower Transfer Method: Unable to assess [employer] Transfer: Unable to assess [employer] Transfer Method: Unable to assess Mobility  Bed Mobility Bed Mobility: Rolling Right;Rolling Left;Sit to Supine;Supine to Sit Rolling Right: Supervision/verbal cueing Rolling Left: Supervision/Verbal cueing Supine to Sit: Supervision/Verbal cueing Sit to Supine: Supervision/Verbal cueing Transfers Sit to Stand: Minimal Assistance - Patient > 75% Stand to Sit: Contact Guard/Touching assist   Discharge Criteria: Patient will be discharged from OT if patient refuses treatment 3 consecutive times without medical reason, if treatment goals not met, if there is a change in medical status, if patient makes no progress towards goals or if patient is discharged from hospital.  The above assessment, treatment plan, treatment alternatives and goals were discussed and mutually agreed upon: by patient  Melvyn Novas, MS, OTR/L  01/24/2023, 12:17 PM

## 2023-01-24 NOTE — Progress Notes (Signed)
Orthopedic Tech Progress Note Patient Details:  Jennifer [name] November 17, 1980 [id number]  Routine order for a R limb guard and size 2XL shrinker called into [facility].  Patient ID: Jennifer [name], female   DOB: January 15, 1981, 42 y.o.   MRN: [id number]  Docia Furl 01/24/2023, 11:16 AM

## 2023-01-25 DIAGNOSIS — E1142 Type 2 diabetes mellitus with diabetic polyneuropathy: Secondary | ICD-10-CM | POA: Diagnosis not present

## 2023-01-25 DIAGNOSIS — G8918 Other acute postprocedural pain: Secondary | ICD-10-CM

## 2023-01-25 DIAGNOSIS — N1831 Chronic kidney disease, stage 3a: Secondary | ICD-10-CM | POA: Diagnosis not present

## 2023-01-25 DIAGNOSIS — S88112S Complete traumatic amputation at level between knee and ankle, left lower leg, sequela: Secondary | ICD-10-CM | POA: Diagnosis not present

## 2023-01-25 LAB — GLUCOSE, CAPILLARY
Glucose-Capillary: 159 mg/dL — ABNORMAL HIGH (ref 70–99)
Glucose-Capillary: 172 mg/dL — ABNORMAL HIGH (ref 70–99)
Glucose-Capillary: 85 mg/dL (ref 70–99)
Glucose-Capillary: 98 mg/dL (ref 70–99)

## 2023-01-25 LAB — RHEUMATOID FACTOR: Rheumatoid fact SerPl-aCnc: 24.6 [IU]/mL — ABNORMAL HIGH (ref <14.0)

## 2023-01-25 LAB — C4 COMPLEMENT: Complement C4, Body Fluid: 47 mg/dL — ABNORMAL HIGH (ref 12–38)

## 2023-01-25 MED ORDER — GABAPENTIN 400 MG PO CAPS
800.0000 mg | ORAL_CAPSULE | Freq: Once | ORAL | Status: AC
  Administered 2023-01-25: 800 mg via ORAL

## 2023-01-25 MED ORDER — CYCLOBENZAPRINE HCL 5 MG PO TABS
10.0000 mg | ORAL_TABLET | Freq: Three times a day (TID) | ORAL | Status: DC
  Administered 2023-01-25 – 2023-02-05 (×33): 10 mg via ORAL
  Filled 2023-01-25 (×33): qty 2

## 2023-01-25 MED ORDER — LIDOCAINE 5 % EX PTCH
1.0000 | MEDICATED_PATCH | CUTANEOUS | Status: DC
  Administered 2023-01-25 – 2023-02-03 (×6): 1 via TRANSDERMAL
  Filled 2023-01-25 (×9): qty 1

## 2023-01-25 MED ORDER — GABAPENTIN 400 MG PO CAPS
800.0000 mg | ORAL_CAPSULE | Freq: Two times a day (BID) | ORAL | Status: DC
  Administered 2023-01-25 – 2023-01-26 (×2): 800 mg via ORAL
  Filled 2023-01-25 (×3): qty 2

## 2023-01-25 MED ORDER — INSULIN ASPART 100 UNIT/ML IJ SOLN
12.0000 [IU] | Freq: Two times a day (BID) | INTRAMUSCULAR | Status: DC
Start: 2023-01-25 — End: 2023-01-28
  Administered 2023-01-25 – 2023-01-27 (×3): 12 [IU] via SUBCUTANEOUS

## 2023-01-25 MED ORDER — GABAPENTIN 400 MG PO CAPS: 400.0000 mg | ORAL_CAPSULE | Freq: Once | ORAL | Status: DC

## 2023-01-25 NOTE — Progress Notes (Addendum)
PROGRESS NOTE   Subjective/Complaints:  Stump pain > phantom pain  Also c/o pain in posterior thigh on right side  Elevated low grade temp x 1  ROS- neg CP, SOB,  N/V/D, + poor appetite Objective:   DG Chest Port 1 View Result Date: 01/23/2023 CLINICAL DATA:  Vasculitis. EXAM: PORTABLE CHEST 1 VIEW COMPARISON:  05/26/2022 FINDINGS: The lungs are clear without focal pneumonia, edema, pneumothorax or pleural effusion. The cardiopericardial silhouette is within normal limits for size. No acute bony abnormality. IMPRESSION: No active disease. Electronically Signed   By: Kennith Center M.D.   On: 01/23/2023 14:55   Recent Labs    01/23/23 0456 01/24/23 0522  WBC 13.3* 13.8*  HGB 8.0* 7.1*  HCT 27.6* 24.8*  PLT 275 257   Recent Labs    01/23/23 0456 01/24/23 0522  NA 134* 137  K 4.8 4.5  CL 100 104  CO2 23 24  GLUCOSE 125* 90  BUN 22* 14  CREATININE 1.11* 1.39*  CALCIUM 8.9 8.9    Intake/Output Summary (Last 24 hours) at 01/25/2023 0744 Last data filed at 01/24/2023 1315 Gross per 24 hour  Intake 476 ml  Output --  Net 476 ml        Physical Exam: Vital Signs Blood pressure 124/78, pulse 92, temperature 98.8 F (37.1 C), resp. rate 17, height 5\' 7"  (1.702 m), weight 84.5 kg, SpO2 97%.  In therapy in prone position  General: No acute distress, awake and alert Mood and affect are appropriate Ext no thigh swelling or ecchymosis Lungs: Clear to auscultation, breathing unlabored, no rales or wheezes  Skin: No evidence of breakdown, no evidence of rash RIght BKA dressing just changed,pt did not want it unwrapped again this am   Musculoskeletal: Right BKA  No joint swelling   Assessment/Plan: 1. Functional deficits which require 3+ hours per day of interdisciplinary therapy in a comprehensive inpatient rehab setting. Physiatrist is providing close team supervision and 24 hour management of active medical  problems listed below. Physiatrist and rehab team continue to assess barriers to discharge/monitor patient progress toward functional and medical goals  Care Tool:  Bathing    Body parts bathed by patient: Right arm, Left arm, Chest, Abdomen, Front perineal area, Buttocks, Right upper leg, Left upper leg, Face   Body parts bathed by helper: Left lower leg Body parts n/a: Right lower leg   Bathing assist Assist Level: Minimal Assistance - Patient > 75%     Upper Body Dressing/Undressing Upper body dressing   What is the patient wearing?: Pull over shirt    Upper body assist Assist Level: Independent    Lower Body Dressing/Undressing Lower body dressing      What is the patient wearing?: Pants, Underwear/pull up     Lower body assist Assist for lower body dressing: Minimal Assistance - Patient > 75%     Toileting Toileting    Toileting assist Assist for toileting: Minimal Assistance - Patient > 75%     Transfers Chair/bed transfer  Transfers assist     Chair/bed transfer assist level: Minimal Assistance - Patient > 75%     Locomotion Ambulation   Ambulation assist  Ambulation activity did not occur: Safety/medical concerns (pain)          Walk 10 feet activity   Assist  Walk 10 feet activity did not occur: Safety/medical concerns (pain)        Walk 50 feet activity   Assist Walk 50 feet with 2 turns activity did not occur: Safety/medical concerns (pain)         Walk 150 feet activity   Assist Walk 150 feet activity did not occur: Safety/medical concerns (pain)         Walk 10 feet on uneven surface  activity   Assist Walk 10 feet on uneven surfaces activity did not occur: Safety/medical concerns (pain)         Wheelchair     Assist Is the patient using a wheelchair?: Yes Type of Wheelchair: Manual    Wheelchair assist level: Supervision/Verbal cueing Max wheelchair distance: 150ft    Wheelchair 50 feet with 2  turns activity    Assist        Assist Level: Supervision/Verbal cueing   Wheelchair 150 feet activity     Assist      Assist Level: Supervision/Verbal cueing   Blood pressure 124/78, pulse 92, temperature 98.8 F (37.1 C), resp. rate 17, height 5\' 7"  (1.702 m), weight 84.5 kg, SpO2 97%.  Medical Problem List and Plan: 1. Functional deficits secondary to R BKA PAD             -patient may shower if RLE covered             -ELOS/Goals: 9-12 days, PT/OT mod I             -Admit to CIR 2.  Antithrombotics: -DVT/anticoagulation:  Pharmaceutical: Eliquis             -antiplatelet therapy: N/A 3. Pain Management:  Dilaudid 3mg   q4h prn for severe pain- took only 3 times yesterday ,  hydrocodone 10/650 mg prn for severe pain --On robaxin 1000 mg TID does not seen to help will use  change to  flexeril 10mg  TID-12/22--On  gabapentin 400 mg TID-->increase to QID for neuropathic symptoms Given GFR 48 will dose 800mg  q 12 h Has not taken hydrocodone q6h,3 times yesterday , discussed she may take another time per day DIscussed with OT, may benefit from TENS unit, has PT next  Add lidoderm patch right post knee 4. Mood/Behavior/Sleep:  LCSW to follow for evaluation and support.              -antipsychotic agents: N/A 5. Neuropsych/cognition: This patient is capable of making decisions on her own behalf. 6. Skin/Wound Care: Routine pressure relief measures.  7. Fluids/Electrolytes/Nutrition: Monitor I/O. Check CMET in am 8.  H/o A flutter/PAF: Monitor HR TID--continue Cardizem and Apixaban             --monitor for symptoms with increase in activity.  9. T1DM: Hgb A1c-9.2 and blood sugars poorly controlled. BS 800 during August hospitalization.              --monitor BS ac/hs/3 am and use SSI for elevated BS. Will change meal coverage to [facility] b'fast/lunch as she cotinues to drop at late afternoons. May drop further with 3.5 hrs of therapy.  Pt was seen by diabetes coordinator.              --Continue Insulin glargline bid--reports compliance at home.  CBG (last 3)  Recent Labs    01/24/23 1621 01/24/23 2040 01/25/23  0553  GLUCAP 120* 116* 159*  Controlled   10. Hyponatremia: Improved from 127 @ admission to 134             --Monitor for now 11. CKD?:  Per chart review SCr-1.56 on 08/24 and  1.33 @ admission.              --Monitor renal status with serial checks. Avoid nephrotoxic medications.  12. Acute on chronic  iron deficiency anemia:  Has been transfused with 3 units PRBCs.   --Add iron due to severe iron deficiency --Continue to monitor H&H for stability.    --hx of menorrhagia/Fibroids-->on depo with good results PTA but has had recurrance w/clots past 3 days.     Latest Ref Rng & Units 01/24/2023    5:22 AM 01/23/2023    4:56 AM 01/22/2023    5:27 AM  CBC  WBC 4.0 - 10.5 K/uL 13.8  13.3  13.3   Hemoglobin 12.0 - 15.0 g/dL 7.1  8.0  7.4   Hematocrit 36.0 - 46.0 % 24.8  27.6  26.0   Platelets 150 - 400 K/uL 257  275  267    Transfuse if <7.0- recheck in am , no dizziness with position changes, recheck in am   13. Leukocytosis: Monitor for fevers and other signs of infection. 14. Obesity. BMI 34 on admission. Dietary counseling 15. Constipation. Miralax, senokot. 16. Hyperkalemia. Monitor labs    Latest Ref Rng & Units 01/24/2023    5:22 AM 01/23/2023    4:56 AM 01/22/2023    5:27 AM  BMP  Glucose 70 - 99 mg/dL 90  161  096   BUN 6 - 20 mg/dL 14  22  28    Creatinine 0.44 - 1.00 mg/dL 0.45  4.09  8.11   Sodium 135 - 145 mmol/L 137  134  133   Potassium 3.5 - 5.1 mmol/L 4.5  4.8  5.5   Chloride 98 - 111 mmol/L 104  100  104   CO2 22 - 32 mmol/L 24  23  21    Calcium 8.9 - 10.3 mg/dL 8.9  8.9  8.7    Recheck in am  17. HTN. Lisinopril and hydrochlorothiazide have been stopped.    Vitals:   01/24/23 2041 01/25/23 0350  BP:  124/78  Pulse:  92  Resp:  17  Temp: 100.2 F (37.9 C) 98.8 F (37.1 C)  SpO2:  97%  Controlled      LOS: 2  days A FACE TO FACE EVALUATION WAS PERFORMED  Erick Colace 01/25/2023, 7:44 AM

## 2023-01-25 NOTE — Progress Notes (Signed)
Occupational Therapy Session Note  Patient Details  Name: Jennifer [name] MRN: [id number] Date of Birth: October 24, 1980  Today's Date: 01/25/2023 OT Individual Time: 730-840 (70 min)     Short Term Goals: Week 1:  OT Short Term Goal 1 (Week 1): STG = LTG due to ELOS  Skilled Therapeutic Interventions/Progress Updates:    1:1 Pt received in the bed and in a lot of pain. Upon further inspection pt with very tight hamstrings - espeically right above popliteal fossa. Pt instructed to get into prone in bed. Pt able to slowly get into this position and tolerate if for ~ 20 min. While in this position applied heat to hamstring area and allowed rest. Discussed her goals and progress.  Also performed light manual therapy to hamstring - but only tolerate very light input due to pain. Pt's report of pain is all above the gauze dressing from residual limb which didn't cover the knee.  Wrapping were not too tight. Transitioned back into sitting in long sitting on the bed with supervision. Pt had received the 2XL shrinker. Applied with her help to her residual limb with instructions to pull up over her knee.  Also trial TENs for the hamstring pain with success reported later in the day during later session - read other note). No negative reactions.  Pt left in bed resting at end of session    Therapy Documentation Precautions:  Precautions Precautions: Fall Restrictions Weight Bearing Restrictions Per Provider Order: Yes RLE Weight Bearing Per Provider Order: Non weight bearing General:   Vital Signs: Therapy Vitals Temp: 99.7 F (37.6 C) Temp Source: Oral Pulse Rate: 93 Resp: 18 BP: 118/63 Patient Position (if appropriate): Lying Oxygen Therapy SpO2: 97 % O2 Device: Room Air Pain: Pt with increased pain in residual limb MD and RN aware. See above for interventions  Other Treatments:     Therapy/Group: Individual Therapy  [facility] 01/25/2023, 3:13 PM

## 2023-01-25 NOTE — Progress Notes (Signed)
Patient refusing shrinker and ace wrap for BKA. Stating it feels too tight and painful. Nurse educated patient on benefits of using either to prevent swelling. Nurse noted patient had leg hanging off of bed. Nurse educated patient on importance of taking care to not injure limb and reduce swelling by elevating leg.

## 2023-01-25 NOTE — Progress Notes (Signed)
Physical Therapy Session Note  Patient Details  Name: Jennifer [name] MRN: [id number] Date of Birth: Apr 02, 1980  Today's Date: 01/25/2023 PT Individual Time: 0900-0930 PT Individual Time Calculation (min): 30 min   Short Term Goals: Week 1:  PT Short Term Goal 1 (Week 1): STG=LTG due to LOS  Skilled Therapeutic Interventions/Progress Updates:    Focused on education in regards to pain management techniques, stretching and use of TENS during this session. Agreeable to therapy session to tolerance. Performed functional squat/stand pivot transfers without device with CGA overall and cues for set up of w/c and parts management. Pt able to return demonstrate after educated. W/c propulsion on unit modified independent for functional mobility training and generalized strengthening and endurance. Performed transfers as described above. Seated on mat focused on core strengthening exercises with weighted medicine ball (2kg) x 10 reps each diagonal and then rotations x 10 reps each direction. 5# straight weight for bicep curls and overhead press x 10 reps each.   Therapy Documentation Precautions:  Precautions Precautions: Fall Restrictions Weight Bearing Restrictions Per Provider Order: Yes RLE Weight Bearing Per Provider Order: Non weight bearing    Pain:  Pt did not rate pain but c/o LLE residual limb pain - several interventions trialed with OT in their session. Premedicated. Also utilized TENS unit for pain management during our session. Pt did not know if TENS really made a difference but was willing to try it.      Therapy/Group: Individual Therapy  Karolee Stamps Darrol Poke, PT, DPT, CBIS  01/25/2023, 12:41 PM

## 2023-01-25 NOTE — Progress Notes (Signed)
Physical Therapy Session Note  Patient Details  Name: Jennifer [name] MRN: [id number] Date of Birth: Jul 06, 1980  Today's Date: 01/25/2023 PT Individual Time: 1015-1040 PT Individual Time Calculation (min): 25 min   Short Term Goals: Week 1:  PT Short Term Goal 1 (Week 1): STG=LTG due to LOS  Skilled Therapeutic Interventions/Progress Updates:    Session focused on initiating gait training with RW and education in regards to d/c planning, prosthetic process and progress towards goals. Pt performed sit > stand with CGA and cues for hand placement. Initially during gait pt just shuffling feet, but then progressed to lifting foot off of floor and cues for efficient technique with scapular depression and positioning of RW. Pt performed about 20' with min to CGA overall including turning. Discussed goals, home set up and access (discussed technique for stairs and will need to trial in future session), and importance of ROM for future prosthetic use. End of session transferred back to bed with supervision and all needs in reach.   Therapy Documentation Precautions:  Precautions Precautions: Fall Restrictions Weight Bearing Restrictions Per Provider Order: Yes RLE Weight Bearing Per Provider Order: Non weight bearing   Pain: Pt reports pain is better managed this session. Not sure if it was TENS vs pain medication vs both.     Therapy/Group: Individual Therapy  Karolee Stamps Darrol Poke, PT, DPT, CBIS  01/25/2023, 12:48 PM

## 2023-01-25 NOTE — Progress Notes (Signed)
Occupational Therapy Session Note  Patient Details  Name: Jennifer [name] MRN: [id number] Date of Birth: 14-Dec-1980  Today's Date: 01/25/2023 OT Individual Time: 1403-1500 OT Individual Time Calculation (min): 57 min    Short Term Goals: Week 1:  OT Short Term Goal 1 (Week 1): STG = LTG due to ELOS  Skilled Therapeutic Interventions/Progress Updates:    1:1 Pt received in the bed and participated in self care retraining at shower level. Pt ambulated to the bathroom with RW with contact guard with cues for getting close enough before going to sit on a surface. Pt able to perform toileting sit to stand and remove clothing with contact guard for standing balance. Transitioned into the shower stall with RW with contact guard. Discussion about using a tub bench to access her shower at home (its a tub shower). Pt able to shower with grab bar with supervision sit to stand. Pt donned hospital gown and underwear with pad with contact guard for standing balance.  Pt reports she is too fatigue to ambulate back out to the bed/ room and asked to transfer into the w/c again with supervision with cues for safety.    Discussion about limb healing and prep for prosthetic training. Introduced the [employer]  book and showed her some good resources. Also pt reported that the work we did this morning assisted with pain level.  Applied TENS again for another 10 min (no reaction and heat on quad). Pt transferred back into bed and left resting in the bed with husband at bed side.  Therapy Documentation Precautions:  Precautions Precautions: Fall Restrictions Weight Bearing Restrictions Per Provider Order: Yes RLE Weight Bearing Per Provider Order: Non weight bearing    Vital Signs: Therapy Vitals Temp: 99.7 F (37.6 C) Temp Source: Oral Pulse Rate: 93 Resp: 18 BP: 118/63 Patient Position (if appropriate): Lying Oxygen Therapy SpO2: 97 % O2 Device: Room Air Pain:  Ongoing pain in right Le  (behind knee and in hamstring) -applied heat to quad and TENS to hamstring/ outside side of popliteal crease- with some relief. No reaction  Therapy/Group: Individual Therapy  Roney Mans [facility] 01/25/2023, 2:55 PM

## 2023-01-26 DIAGNOSIS — S88112S Complete traumatic amputation at level between knee and ankle, left lower leg, sequela: Secondary | ICD-10-CM | POA: Diagnosis not present

## 2023-01-26 DIAGNOSIS — S88112A Complete traumatic amputation at level between knee and ankle, left lower leg, initial encounter: Secondary | ICD-10-CM

## 2023-01-26 LAB — CBC
HCT: 26.8 % — ABNORMAL LOW (ref 36.0–46.0)
Hemoglobin: 7.5 g/dL — ABNORMAL LOW (ref 12.0–15.0)
MCH: 20.1 pg — ABNORMAL LOW (ref 26.0–34.0)
MCHC: 28 g/dL — ABNORMAL LOW (ref 30.0–36.0)
MCV: 71.7 fL — ABNORMAL LOW (ref 80.0–100.0)
Platelets: 300 10*3/uL (ref 150–400)
RBC: 3.74 MIL/uL — ABNORMAL LOW (ref 3.87–5.11)
RDW: 25.8 % — ABNORMAL HIGH (ref 11.5–15.5)
WBC: 14.1 10*3/uL — ABNORMAL HIGH (ref 4.0–10.5)
nRBC: 0 % (ref 0.0–0.2)

## 2023-01-26 LAB — BASIC METABOLIC PANEL
Anion gap: 10 (ref 5–15)
BUN: 18 mg/dL (ref 6–20)
CO2: 23 mmol/L (ref 22–32)
Calcium: 9.2 mg/dL (ref 8.9–10.3)
Chloride: 101 mmol/L (ref 98–111)
Creatinine, Ser: 1.31 mg/dL — ABNORMAL HIGH (ref 0.44–1.00)
GFR, Estimated: 52 mL/min — ABNORMAL LOW (ref >60)
Glucose, Bld: 104 mg/dL — ABNORMAL HIGH (ref 70–99)
Potassium: 5.1 mmol/L (ref 3.5–5.1)
Sodium: 134 mmol/L — ABNORMAL LOW (ref 135–145)

## 2023-01-26 LAB — ANCA PROFILE
Anti-MPO Antibodies: <0.2 U (ref 0.0–0.9)
Anti-PR3 Antibodies: <0.2 U (ref 0.0–0.9)
Atypical P-ANCA titer: <1:20 {titer}
C-ANCA: <1:20 {titer}
P-ANCA: <1:20 {titer}

## 2023-01-26 LAB — GLUCOSE, CAPILLARY
Glucose-Capillary: 110 mg/dL — ABNORMAL HIGH (ref 70–99)
Glucose-Capillary: 77 mg/dL (ref 70–99)
Glucose-Capillary: 162 mg/dL — ABNORMAL HIGH (ref 70–99)
Glucose-Capillary: 113 mg/dL — ABNORMAL HIGH (ref 70–99)

## 2023-01-26 MED ORDER — GABAPENTIN 400 MG PO CAPS
800.0000 mg | ORAL_CAPSULE | Freq: Three times a day (TID) | ORAL | Status: DC
  Administered 2023-01-26 – 2023-01-28 (×6): 800 mg via ORAL
  Filled 2023-01-26 (×6): qty 2

## 2023-01-26 MED ORDER — DOXYCYCLINE HYCLATE 100 MG PO TABS
100.0000 mg | ORAL_TABLET | Freq: Two times a day (BID) | ORAL | Status: DC
  Administered 2023-01-26 – 2023-01-28 (×5): 100 mg via ORAL
  Filled 2023-01-26 (×5): qty 1

## 2023-01-26 MED ORDER — PHENAZOPYRIDINE HCL 100 MG PO TABS
100.0000 mg | ORAL_TABLET | Freq: Three times a day (TID) | ORAL | Status: AC
  Administered 2023-01-27 – 2023-01-28 (×6): 100 mg via ORAL
  Filled 2023-01-26 (×6): qty 1

## 2023-01-26 NOTE — Progress Notes (Signed)
Physical Therapy Session Note  Patient Details  Name: Jennifer [name] MRN: [id number] Date of Birth: [birth year]  Today's Date: 01/26/2023 PT Individual Time: 1000-1055 PT Individual Time Calculation (min): 55 min   Short Term Goals: Week 1:  PT Short Term Goal 1 (Week 1): STG=LTG due to LOS  Skilled Therapeutic Interventions/Progress Updates:    Chart reviewed and pt agreeable to therapy. Pt received seated EOB with 10/10 c/o pain at surgical site. Session focused on self-management of limb guard, transfers, and WC mobility to promote safe home access. Pt initiated session with education on donning limkb guard, with pt able to demonstrate correct technique. Pt then completed SPT to WC using light CGA + RW and again to toilet and a third time back to WC. Pt then completed 176ft WC mobility using supervision. Pt then completed blocked practice of amb of 20-30 ft using CGA + RW fading to close supervision. Pt then returned to room with same sup assist for WC mobility. In room, pt transferred to recliner via SPT using close sup + no AD. Pt then requested toileting and amb 59ft to toilet using CGA + RW. Session education emphasized safe sit to stand technique to avoid accidental weight shift towards RLE. At end of session, pt was left seated on toilet with direct handoff to NT and all needs in reach.     Therapy Documentation Precautions:  Precautions Precautions: Fall Restrictions Weight Bearing Restrictions Per Provider Order: Yes RLE Weight Bearing Per Provider Order: Non weight bearing    Therapy/Group: Individual Therapy  Dionne Milo, PT, DPT 01/26/2023, 12:07 PM

## 2023-01-26 NOTE — Progress Notes (Signed)
PROGRESS NOTE   Subjective/Complaints: No new complaints this morning WBC increased slightly, repeat ordered for tomorrow to trend Pain 10/10 phantom and residual limb  ROS: +pain in residual limb and phantom limb pain  Objective:   No results found.  Recent Labs    01/24/23 0522 01/26/23 0515  WBC 13.8* 14.1*  HGB 7.1* 7.5*  HCT 24.8* 26.8*  PLT 257 300   Recent Labs    01/24/23 0522 01/26/23 0515  NA 137 134*  K 4.5 5.1  CL 104 101  CO2 24 23  GLUCOSE 90 104*  BUN 14 18  CREATININE 1.39* 1.31*  CALCIUM 8.9 9.2    Intake/Output Summary (Last 24 hours) at 01/26/2023 1006 Last data filed at 01/26/2023 0800 Gross per 24 hour  Intake 717 ml  Output --  Net 717 ml        Physical Exam: Vital Signs Blood pressure 116/77, pulse 95, temperature 98.5 F (36.9 C), temperature source Oral, resp. rate 16, height 5\' 7"  (1.702 m), weight 84.5 kg, SpO2 96%.  In therapy in prone position  General: No acute distress, awake and alert Mood and affect are appropriate Ext no thigh swelling or ecchymosis Lungs: Clear to auscultation, breathing unlabored, no rales or wheezes  Skin: +blistering around incision site RIght BKA dressing just changed,pt did not want it unwrapped again this am   Musculoskeletal: Right BKA  No joint swelling   Assessment/Plan: 1. Functional deficits which require 3+ hours per day of interdisciplinary therapy in a comprehensive inpatient rehab setting. Physiatrist is providing close team supervision and 24 hour management of active medical problems listed below. Physiatrist and rehab team continue to assess barriers to discharge/monitor patient progress toward functional and medical goals  Care Tool:  Bathing    Body parts bathed by patient: Right arm, Left arm, Chest, Abdomen, Front perineal area, Buttocks, Right upper leg, Left upper leg, Face   Body parts bathed by helper: Left  lower leg Body parts n/a: Right lower leg   Bathing assist Assist Level: Minimal Assistance - Patient > 75%     Upper Body Dressing/Undressing Upper body dressing   What is the patient wearing?: Pull over shirt    Upper body assist Assist Level: Independent    Lower Body Dressing/Undressing Lower body dressing      What is the patient wearing?: Pants, Underwear/pull up     Lower body assist Assist for lower body dressing: Minimal Assistance - Patient > 75%     Toileting Toileting    Toileting assist Assist for toileting: Contact Guard/Touching assist     Transfers Chair/bed transfer  Transfers assist     Chair/bed transfer assist level: Contact Guard/Touching assist     Locomotion Ambulation   Ambulation assist   Ambulation activity did not occur: Safety/medical concerns (pain)  Assist level: Minimal Assistance - Patient > 75% Assistive device: Walker-rolling Max distance: 20'   Walk 10 feet activity   Assist  Walk 10 feet activity did not occur: Safety/medical concerns (pain)  Assist level: Minimal Assistance - Patient > 75% Assistive device: Walker-rolling   Walk 50 feet activity   Assist Walk 50 feet with 2 turns  activity did not occur: Safety/medical concerns (pain)         Walk 150 feet activity   Assist Walk 150 feet activity did not occur: Safety/medical concerns (pain)         Walk 10 feet on uneven surface  activity   Assist Walk 10 feet on uneven surfaces activity did not occur: Safety/medical concerns (pain)         Wheelchair     Assist Is the patient using a wheelchair?: Yes Type of Wheelchair: Manual    Wheelchair assist level: Independent Max wheelchair distance: 130ft    Wheelchair 50 feet with 2 turns activity    Assist        Assist Level: Independent   Wheelchair 150 feet activity     Assist      Assist Level: Independent   Blood pressure 116/77, pulse 95, temperature 98.5 F  (36.9 C), temperature source Oral, resp. rate 16, height 5\' 7"  (1.702 m), weight 84.5 kg, SpO2 96%.  Medical Problem List and Plan: 1. 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  Antithrombotics: -DVT/anticoagulation:  Pharmaceutical: Eliquis             -antiplatelet therapy: N/A 3. Pain Management:  Dilaudid 3mg   q4h prn for severe pain- took only 3 times yesterday ,  hydrocodone 10/650 mg prn for severe pain --On robaxin 1000 mg TID does not seen to help will use  change to  flexeril 10mg  TID-12/22--increase gabapentin to 800mg  TID Given GFR 48 will dose 800mg  q 12 h Has not taken hydrocodone q6h,3 times yesterday , discussed she may take another time per day DIscussed with OT, may benefit from TENS unit, has PT next  Add lidoderm patch right post knee  4. Mood/Behavior/Sleep:  LCSW to follow for evaluation and support.              -antipsychotic agents: N/A 5. Neuropsych/cognition: This patient is capable of making decisions on her own behalf. 6. Skin/Wound Care: Routine pressure relief measures.  7. Fluids/Electrolytes/Nutrition: Monitor I/O. Check CMET in am 8.  H/o A flutter/PAF: Monitor HR TID--continue Cardizem and Apixaban             --monitor for symptoms with increase in activity.  9. T1DM: Hgb A1c-9.2 and blood sugars poorly controlled. BS 800 during August hospitalization.              --monitor BS ac/hs/3 am and use SSI for elevated BS. Will change meal coverage to [employer] [facility] [employer] b'fast/lunch as she cotinues to drop at late afternoons. May drop further with 3.5 hrs of therapy.  Pt was seen by diabetes coordinator.             --Continue Insulin glargline bid--reports compliance at home.  CBG (last 3)  Recent Labs    01/25/23 1625 01/25/23 2040 01/26/23 0558  GLUCAP 85 98 110*  Controlled   10. Hyponatremia: Improved from 127 @ admission to  134             --Monitor for now 11. CKD?:  Per chart review SCr-1.56 on 08/24 and  1.33 @ admission.              --  Monitor renal status with serial checks. Avoid nephrotoxic medications.  12. Acute on chronic  iron deficiency anemia:  Has been transfused with 3 units PRBCs.   --Add iron due to severe iron deficiency --Continue to monitor H&H for stability.    --hx of menorrhagia/Fibroids-->on depo with good results PTA but has had recurrance w/clots past 3 days.     Latest Ref Rng & Units 01/26/2023    5:15 AM 01/24/2023    5:22 AM 01/23/2023    4:56 AM  CBC  WBC 4.0 - 10.5 K/uL 14.1  13.8  13.3   Hemoglobin 12.0 - 15.0 g/dL 7.5  7.1  8.0   Hematocrit 36.0 - 46.0 % 26.8  24.8  27.6   Platelets 150 - 400 K/uL 300  257  275    Transfuse if <7.0- recheck in am , no dizziness with position changes, recheck in am   13. Leukocytosis: Monitor for fevers and other signs of infection. 14. Obesity. BMI 34 on admission. Dietary counseling 15. Constipation. Miralax, senokot. 16. Hyperkalemia. Monitor labs    Latest Ref Rng & Units 01/26/2023    5:15 AM 01/24/2023    5:22 AM 01/23/2023    4:56 AM  BMP  Glucose 70 - 99 mg/dL 161  90  096   BUN 6 - 20 mg/dL 18  14  22    Creatinine 0.44 - 1.00 mg/dL 0.45  4.09  8.11   Sodium 135 - 145 mmol/L 134  137  134   Potassium 3.5 - 5.1 mmol/L 5.1  4.5  4.8   Chloride 98 - 111 mmol/L 101  104  100   CO2 22 - 32 mmol/L 23  24  23    Calcium 8.9 - 10.3 mg/dL 9.2  8.9  8.9    Recheck in am  17. HTN. Lisinopril and hydrochlorothiazide have been stopped.    Vitals:   01/25/23 1919 01/26/23 0430  BP: (!) 149/85 116/77  Pulse: 96 95  Resp: 18 16  Temp: 99.1 F (37.3 C) 98.5 F (36.9 C)  SpO2: 100% 96%  Controlled   18. Blistering at incision site: [facility]ra scheduled  19. Leukocytosis: Vibra scheduled  LOS: 3 days A FACE TO FACE EVALUATION WAS PERFORMED  Sophiarose Eades P Shelitha Magley 01/26/2023, 10:06 AM

## 2023-01-26 NOTE — Progress Notes (Signed)
Patient complains of 10/10 pain in BKA with no pain relief with medication. Patient is also concerned about swelling. Nurse again educated patient on compression wrap.

## 2023-01-26 NOTE — Progress Notes (Signed)
Occupational Therapy Session Note  Patient Details  Name: Jennifer [name] MRN: [id number] Date of Birth: Jan 10, 1981  Today's Date: 01/26/2023 OT Individual Time: 0805-0900 & 1335-1445 OT Individual Time Calculation (min): 55 min & 70 min   Short Term Goals: Week 1:  OT Short Term Goal 1 (Week 1): STG = LTG due to ELOS  Skilled Therapeutic Interventions/Progress Updates:  Session 1 Skilled OT intervention completed with focus on limb care education, functional transfers, toileting. Pt received upright in bed, agreeable to session. 10/10 pain reported in Rt residual limb; nurse present upon arrival administering mkeds. OT offered rest breaks, repositioning and modified ways of adding compression for pain reduction.  Pt with current guaze wrap below the knee on Rt residual limb with report that limb feels extra swollen this date. OT advised donning of shrinker for edema management and pt agreeable for trial. Doffed gauze wrap, with limb noted to have 2 blisters (Rt lateral above incision, and below at the base of the stump but not open); MD/PA notified and MD present for assessment. Pt applied shea butter cream to dry flaky skin above the incision. OT utilized donning tube to reduce pain during process, with education offered on technique. With max A, pt assisted OT with donning of 2X shrinker. Education provided on shrinker care including hand washing or in washing machine inside pillow case.   Transitioned to EOB with mod I, then supervision stand pivot > w/c using bed rail for UE support. Transported to [facility] over toilet, utilized grab bar for CGA stand pivot > BSC, with CGA for balance for all toileting steps. Pt continent of urinary void only, and changed underwear/pad (on menstrual cycle). CGA stand pivot > w/c, then self-propelled in w/c > sink for hand and face hygiene. CGA stand pivot > EOB using bed rail, with pt requesting to stay sitting EOB. Education provided on importance of elevation of  limb vs being in dependent position. Pt remained sitting EOB, with bed alarm on/activated, and with all needs in reach at end of session.  Session 2 Skilled OT intervention completed with focus on ADL retraining, functional endurance, and mobility within a shower context. Pt received upright in bed, agreeable to session. 9/10 pain reported in Rt residual limb; nurse notified of pain med request. OT offered rest breaks, repositioning and moist heat via shower for pain relief.  Pt completed all sit > stands, stand pivots and room distance ambulatory transfers with CGA while using RW. Pt will difficulty standing from lower heights but reports bed at lowest position is lower than her bed at home. Min cues needed for safety throughout.  Ambulated > toilet with good LLE eccentric control during hopping pattern. Completed stand pivot > toilet, managed all toileting steps for continent void with CGA, then ambulatory transfer > bench using grab bar for balance.  Waterproof cover applied to Rt residual limkb prior to shower. Pt was able to bathe all parts with supervision, at the sit > stand level while using grab bar for balance. Cues needed for doffing clothes prior to showering and for drying LLE off prior to exiting. CGA transfer using grab bar/RW > toilet. Able to donn shirt/deo with set up A. Threaded LB clothing with supervision and donned over hips with CGA using grab bar for balance. Ambulated > EOB. Pt applied shea butter to LLE, then due to edema, OT donned knee high TED, and pt donned grip sock with set up A. Stand pivot to w/c. Education provided on donning/doffing  leg rest technique with supervision needed. Towel with coban provided over support pad to reduce tendency to WB through RLE due to Rt knee tightness.  Transported dependently in w/c > gift shop for time. OT tasked pt with locating 3 items (self-care item, a gift for someone, and a food item) inside the gift shop at w/c level. Pt  self-propelled in w/c about 100 ft total with supervision. Required min A to remember list given at start of task, however no difficulty locating items. Discussed how the activity relates to other IADL tasks such as grocery shopping at the community level.  Trans[place] > [place] for change of scenery. Discussed DC plans and needed DME including RW (can use this to enter bathroom doorway vs w/c), TTB for tub/shower transfer, 18" x16" manual w/c with Rt elevating leg rest (for future prosthesis vs amputee support pad) and potential shower stool for stairs however has not tried yet. CSW notified. Pt enjoyed being out of room.  Transported back to room, stand pivot > EOB, then remained sitting EOB, with bed alarm on/activated, and with all needs in reach at end of session.   Therapy Documentation Precautions:  Precautions Precautions: Fall Restrictions Weight Bearing Restrictions Per Provider Order: Yes RLE Weight Bearing Per Provider Order: Non weight bearing    Therapy/Group: Individual Therapy  Melvyn Novas, MS, OTR/L  01/26/2023, 2:48 PM

## 2023-01-26 NOTE — Progress Notes (Signed)
Patient reported to have frequency urgency and has gone to bathroom multiple times in an hour. Nurse advised to scan bladder which was reported to have 0 cc urine. Symptoms likely due to dysuria from UTI--bacterial v/s candidal with history of DM/recent surgery. Will order UA for work up.

## 2023-01-26 NOTE — Progress Notes (Signed)
Inpatient Rehabilitation  Patient information reviewed and entered into eRehab system by Demarrio Menges Treg Diemer, OTR/L, Rehab Quality Coordinator.   Information including medical coding, functional ability and quality indicators will be reviewed and updated through discharge.   

## 2023-01-26 NOTE — Progress Notes (Signed)
Inpatient Rehabilitation Center Individual Statement of Services  Patient Name:  [name]  Date:  01/26/2023  Welcome to the Inpatient Rehabilitation Center.  Our goal is to provide you with an individualized program based on your diagnosis and situation, designed to meet your specific needs.  With this comprehensive rehabilitation program, you will be expected to participate in at least 3 hours of rehabilitation therapies Monday-Friday, with modified therapy programming on the weekends.  Your rehabilitation program will include the following services:  Physical Therapy (PT), Occupational Therapy (OT), Speech Therapy (ST), 24 hour per day rehabilitation nursing, Therapeutic Recreaction (TR), Neuropsychology, Care Coordinator, Rehabilitation Medicine, Nutrition Services, Pharmacy Services, and Other  Weekly team conferences will be held on Wednesdays to discuss your progress.  Your Inpatient Rehabilitation Care Coordinator will talk with you frequently to get your input and to update you on team discussions.  Team conferences with you and your family in attendance may also be held.  Expected length of stay: 9-12 Days  Overall anticipated outcome:  MOD I  Depending on your progress and recovery, your program may change. Your Inpatient Rehabilitation Care Coordinator will coordinate services and will keep you informed of any changes. Your Inpatient Rehabilitation Care Coordinator's name and contact numbers are listed  below.  The following services may also be recommended but are not provided by the Inpatient Rehabilitation Center:   Home Health Rehabiltiation Services Outpatient Rehabilitation Services    Arrangements will be made to provide these services after discharge if needed.  Arrangements include referral to agencies that provide these services.  Your insurance has been verified to be:   [place] MEDICAID Your primary doctor is:  [facility]   Pertinent  information will be shared with your doctor and your insurance company.  Inpatient Rehabilitation Care Coordinator:  Lavera Guise, [place] [phone] or [phone]  Information discussed with and copy given to patient by: Andria Rhein, 01/26/2023, 11:40 AM

## 2023-01-26 NOTE — Progress Notes (Signed)
Patient ID: Jennifer [name], female   DOB: 1980-11-27, 42 y.o.   MRN: [id number]  Disability information provided to patient.

## 2023-01-26 NOTE — Progress Notes (Signed)
Inpatient Rehabilitation Care Coordinator Assessment and Plan Patient Details  Name: Jennifer [name] MRN: [id number] Date of Birth: 04-Mar-1980  Today's Date: 01/26/2023  Hospital Problems: Principal Problem:   Unilateral complete BKA, left, sequela [facility])  Past Medical History:  Past Medical History:  Diagnosis Date   Abnormal uterine bleeding    Due to fibriods/required transfusions   Atrial fibrillation with RVR (HCC) 05/2022   underwent cardioversion by Dr. Mariah Milling   Closed fracture of coronoid process of right ulna 2011   Diabetes mellitus without complication (HCC)    Headache    Hypertension    Menorrhagia    OSA (obstructive sleep apnea)    Past Surgical History:  Past Surgical History:  Procedure Laterality Date   AMPUTATION Right 01/17/2023   Procedure: AMPUTATION BELOW KNEE;  Surgeon: Iline Oven, MD;  Location: [facility] ORS;  Service: Vascular;  Laterality: Right;   CESAREAN SECTION     x2   LOWER EXTREMITY ANGIOGRAPHY Right 01/15/2023   Procedure: Lower Extremity Angiography;  Surgeon: Annice Needy, MD;  Location: [facility] INVASIVE CV LAB;  Service: Cardiovascular;  Laterality: Right;   none     Social History:  reports that she has been smoking. She has never used smokeless tobacco. She reports that she does not drink alcohol and does not use drugs.  Family / Support Systems Marital Status: Married How Long?: n/a Patient Roles: Spouse, Parent Spouse/Significant Other: Prince Solian Children: n/a Other Supports: Burna Mortimer and Chiffon Anticipated Caregiver: Goals, MOD I and spouse will be primary contact Ability/Limitations of Caregiver: spouse works M-F until 530 P Caregiver Availability: Evenings only Family Dynamics: supportive spouse and children  Social History Preferred language: English Religion: Christian Cultural Background: n/a Education: HS Health Literacy - How often do you need to have someone help you when you read instructions, pamphlets, or other  written material from your doctor or pharmacy?: Never Writes: Yes Legal History/Current Legal Issues: n/a Guardian/Conservator: n/a   Abuse/Neglect Abuse/Neglect Assessment Can Be Completed: Yes Physical Abuse: Denies Verbal Abuse: Denies Sexual Abuse: Denies Exploitation of patient/patient's resources: Denies Self-Neglect: Denies  Patient response to: Social Isolation - How often do you feel lonely or isolated from those around you?: Never  Emotional Status Pt's affect, behavior and adjustment status: n/a Recent Psychosocial Issues: coping Psychiatric History: n/a Substance Abuse History: n/a  Patient / Family Perceptions, Expectations & Goals Pt/Family understanding of illness & functional limitations: yes Premorbid pt/family roles/activities: Independent Anticipated changes in roles/activities/participation: Plans to remain MOD I Pt/family expectations/goals: MOD I  Building surveyor: None Premorbid Home Care/DME Agencies: None Transportation available at discharge: spouse Is the patient able to respond to transportation needs?: Yes In the past 12 months, has lack of transportation kept you from medical appointments or from getting medications?: No In the past 12 months, has lack of transportation kept you from meetings, work, or from getting things needed for daily living?: No Resource referrals recommended: Neuropsychology  Discharge Planning Living Arrangements: Children, Spouse/significant other Support Systems: Children, Spouse/significant other Type of Residence: Private residence (1 level, 5 steps) Insurance Resources: Media planner (specify) Financial Resources: Employment, Garment/textile technologist Screen Referred: No Living Expenses: Rent Does the patient have any problems obtaining your medications?: No Home Management: Independent Patient/Family Preliminary Plans: Plans to continue to manage Care Coordinator Barriers to Discharge:  Lack of/limited family support, Insurance for SNF coverage, Decreased caregiver support Care Coordinator Anticipated Follow Up Needs: HH/OP Expected length of stay: 9-12 Days  Clinical Impression SW  met with patient, introduced self and explained role Patient lives with spouse and daughter (62). Spouse works during the day M-F until about 5:30 PM. 1 level home, 5 steps to enter. Patient concerned that hallways will be to small to navigate [facility] in the home. SW will provided patient with contact information for disability and medical records.  Andria Rhein 01/26/2023, 2:49 PM

## 2023-01-26 NOTE — IPOC Note (Signed)
Overall Plan of Care [facility] Patient Details Name: Jennifer [name] MRN: [id number] DOB: 1980-05-08  Admitting Diagnosis: Unilateral complete BKA, left, sequela [facility])  Hospital Problems: Principal Problem:   Unilateral complete BKA, left, sequela (HCC)     Functional Problem List: Nursing Endurance, Medication Management, Pain, Safety, Skin Integrity  PT Balance, Edema, Endurance, Skin Integrity, Pain, Sensory, Nutrition, Motor  OT Balance, Edema, Endurance, Motor, Pain, Skin Integrity, Sensory  SLP    TR         Basic ADL's: OT Bathing, Dressing, Toileting     Advanced  ADL's: OT Simple Meal Preparation     Transfers: PT Bed Mobility, Bed to Chair, Car, Occupational psychologist, Research scientist (life sciences): PT Ambulation, Psychologist, prison and probation services, Stairs     Additional Impairments: OT None  SLP        TR      Anticipated Outcomes Item Anticipated Outcome  Self Feeding Independent  Swallowing      Basic self-care  Mod I  Toileting  Mod I   Bathroom Transfers Mod I  Bowel/Bladder  Maintain continence  Transfers  Mod I with LRAD  Locomotion  Mod I with LRAD  Communication     Cognition     Pain  <4 with prns  Safety/Judgment  supervision and no falls   Therapy Plan: PT Intensity: Minimum of 1-2 x/day ,45 to 90 minutes PT Frequency: 5 out of 7 days PT Duration Estimated Length of Stay: 7-10 days OT Intensity: Minimum of 1-2 x/day, 45 to 90 minutes OT Frequency: 5 out of 7 days OT Duration/Estimated Length of Stay: 7-10 days     Team Interventions: Nursing Interventions Patient/Family Education, Disease Management/Prevention, Pain Management, Medication Management, Skin Care/Wound Management, Discharge Planning  PT interventions Ambulation/gait training, Discharge planning, Functional mobility training, Psychosocial support, Therapeutic Activities, Balance/vestibular training, Disease management/prevention, Neuromuscular re-education, Skin care/wound  management, Therapeutic Exercise, Wheelchair propulsion/positioning, DME/adaptive equipment instruction, Pain management, Splinting/orthotics, UE/LE Strength taining/ROM, Community reintegration, Equities trader education, Museum/gallery curator, UE/LE Coordination activities, Functional electrical stimulation  OT Interventions Warden/ranger, Discharge planning, Pain management, Self Care/advanced ADL retraining, Therapeutic Activities, UE/LE Coordination activities, Disease mangement/prevention, Functional mobility training, Skin care/wound managment, Patient/family education, Therapeutic Exercise, DME/adaptive equipment instruction, Neuromuscular re-education, Psychosocial support, UE/LE Strength taining/ROM, Wheelchair propulsion/positioning  SLP Interventions    TR Interventions    SW/CM Interventions     Barriers to Discharge MD  Medical stability  Nursing Decreased caregiver support, Home environment access/layout, Wound Care, Lack of/limited family support, Weight bearing restrictions 1 level, 4-5 steps, right rail. Spouse available 24/7.  PT Inaccessible home environment, Decreased caregiver support, Home environment access/layout, Wound Care, Weight bearing restrictions, Other (comments) pain, 4 STE with R handrail, spouse works during the day  OT Curator, Inaccessible home environment, Weight bearing restrictions, Wound Care    SLP      SW       Team Discharge Planning: Destination: PT-Home ,OT- Home , SLP-  Projected Follow-up: PT-Outpatient PT, OT-  Outpatient OT, SLP-  Projected Equipment Needs: PT-To be determined, OT- To be determined, SLP-  Equipment Details: PT-has none, OT-  Patient/family involved in discharge planning: PT- Patient,  OT-Patient, SLP-   MD ELOS: 9-12 days Medical Rehab Prognosis:  Excellent Assessment: The patient has been admitted for CIR therapies with the diagnosis of R BKA. The team will be addressing functional mobility,  strength, stamina, balance, safety, adaptive techniques and equipment, self-care, bowel and bladder mgt, patient and caregiver education.  Goals have been set at modI. Anticipated discharge destination is home.       See Team Conference Notes for weekly updates to the plan of care

## 2023-01-27 ENCOUNTER — Other Ambulatory Visit: Payer: Self-pay

## 2023-01-27 DIAGNOSIS — S88111A Complete traumatic amputation at level between knee and ankle, right lower leg, initial encounter: Principal | ICD-10-CM | POA: Diagnosis present

## 2023-01-27 DIAGNOSIS — S88112S Complete traumatic amputation at level between knee and ankle, left lower leg, sequela: Secondary | ICD-10-CM | POA: Diagnosis not present

## 2023-01-27 LAB — URINALYSIS, W/ REFLEX TO CULTURE (INFECTION SUSPECTED)
Bacteria, UA: NONE SEEN
Bilirubin Urine: NEGATIVE
Glucose, UA: NEGATIVE mg/dL
Ketones, ur: NEGATIVE mg/dL
Nitrite: NEGATIVE
Protein, ur: 30 mg/dL — AB
RBC / HPF: >50 RBC/hpf (ref 0–5)
Specific Gravity, Urine: 1.009 (ref 1.005–1.030)
pH: 5 (ref 5.0–8.0)

## 2023-01-27 LAB — CBC WITH DIFFERENTIAL/PLATELET
Abs Immature Granulocytes: 0.15 10*3/uL — ABNORMAL HIGH (ref 0.00–0.07)
Basophils Absolute: 0.1 10*3/uL (ref 0.0–0.1)
Basophils Relative: 1 %
Eosinophils Absolute: 0.5 10*3/uL (ref 0.0–0.5)
Eosinophils Relative: 4 %
HCT: 25.6 % — ABNORMAL LOW (ref 36.0–46.0)
Hemoglobin: 7 g/dL — ABNORMAL LOW (ref 12.0–15.0)
Immature Granulocytes: 1 %
Lymphocytes Relative: 20 %
Lymphs Abs: 3.1 10*3/uL (ref 0.7–4.0)
MCH: 19.6 pg — ABNORMAL LOW (ref 26.0–34.0)
MCHC: 27.3 g/dL — ABNORMAL LOW (ref 30.0–36.0)
MCV: 71.5 fL — ABNORMAL LOW (ref 80.0–100.0)
Monocytes Absolute: 1 10*3/uL (ref 0.1–1.0)
Monocytes Relative: 7 %
Neutro Abs: 10.6 10*3/uL — ABNORMAL HIGH (ref 1.7–7.7)
Neutrophils Relative %: 67 %
Platelets: 389 10*3/uL (ref 150–400)
RBC: 3.58 MIL/uL — ABNORMAL LOW (ref 3.87–5.11)
RDW: 26 % — ABNORMAL HIGH (ref 11.5–15.5)
Smear Review: NORMAL
WBC: 15.5 10*3/uL — ABNORMAL HIGH (ref 4.0–10.5)
nRBC: 0.1 % (ref 0.0–0.2)

## 2023-01-27 LAB — GLUCOSE, CAPILLARY
Glucose-Capillary: 128 mg/dL — ABNORMAL HIGH (ref 70–99)
Glucose-Capillary: 108 mg/dL — ABNORMAL HIGH (ref 70–99)
Glucose-Capillary: 156 mg/dL — ABNORMAL HIGH (ref 70–99)
Glucose-Capillary: 152 mg/dL — ABNORMAL HIGH (ref 70–99)
Glucose-Capillary: 137 mg/dL — ABNORMAL HIGH (ref 70–99)

## 2023-01-27 MED ORDER — HYDROMORPHONE HCL 2 MG PO TABS
4.0000 mg | ORAL_TABLET | ORAL | Status: DC | PRN
  Administered 2023-01-27 – 2023-01-30 (×14): 4 mg via ORAL
  Filled 2023-01-27 (×16): qty 2

## 2023-01-27 MED ORDER — TRAMADOL HCL 50 MG PO TABS
50.0000 mg | ORAL_TABLET | Freq: Four times a day (QID) | ORAL | Status: DC | PRN
  Administered 2023-01-27 – 2023-02-05 (×12): 50 mg via ORAL
  Filled 2023-01-27 (×13): qty 1

## 2023-01-27 MED ORDER — SORBITOL 70 % SOLN
30.0000 mL | Freq: Once | Status: AC
  Administered 2023-01-27: 30 mL via ORAL
  Filled 2023-01-27: qty 30

## 2023-01-27 MED ORDER — PANTOPRAZOLE SODIUM 40 MG PO TBEC
40.0000 mg | DELAYED_RELEASE_TABLET | Freq: Every day | ORAL | Status: DC
Start: 2023-01-27 — End: 2023-02-05
  Administered 2023-01-27 – 2023-02-05 (×10): 40 mg via ORAL
  Filled 2023-01-27 (×11): qty 1

## 2023-01-27 NOTE — Progress Notes (Signed)
PROGRESS NOTE   Subjective/Complaints: Discussed that WBC still increased, could be 2/2 to UTI but UA appears normal Pain continues to be 10/10, but better than 20/10 as she felt before  ROS: +pain in residual limb and phantom limb pain, +urinary frequency  Objective:   No results found.  Recent Labs    01/26/23 0515 01/27/23 0510  WBC 14.1* 15.5*  HGB 7.5* 7.0*  HCT 26.8* 25.6*  PLT 300 389   Recent Labs    01/26/23 0515  NA 134*  K 5.1  CL 101  CO2 23  GLUCOSE 104*  BUN 18  CREATININE 1.31*  CALCIUM 9.2    Intake/Output Summary (Last 24 hours) at 01/27/2023 0936 Last data filed at 01/27/2023 0729 Gross per 24 hour  Intake 591 ml  Output --  Net 591 ml        Physical Exam: Vital Signs Blood pressure 125/78, pulse 95, temperature 98.1 F (36.7 C), temperature source Oral, resp. rate 18, height 5\' 7"  (1.702 m), weight 84.5 kg, SpO2 97%.  In therapy in prone position  General: No acute distress, awake and alert Mood and affect are appropriate Ext no thigh swelling or ecchymosis Lungs: Clear to auscultation, breathing unlabored, no rales or wheezes  Skin: +blistering around incision site RIght BKA dressing just changed,pt did not want it unwrapped again this am, exam stable 12/24  Musculoskeletal: Right BKA  No joint swelling   Assessment/Plan: 1. Functional deficits which require 3+ hours per day of interdisciplinary therapy in a comprehensive inpatient rehab setting. Physiatrist is providing close team supervision and 24 hour management of active medical problems listed below. Physiatrist and rehab team continue to assess barriers to discharge/monitor patient progress toward functional and medical goals  Care Tool:  Bathing    Body parts bathed by patient: Right arm, Left arm, Chest, Abdomen, Front perineal area, Buttocks, Right upper leg, Left upper leg, Face   Body parts bathed by  helper: Left lower leg Body parts n/a: Right lower leg   Bathing assist Assist Level: Minimal Assistance - Patient > 75%     Upper Body Dressing/Undressing Upper body dressing   What is the patient wearing?: Pull over shirt    Upper body assist Assist Level: Independent    Lower Body Dressing/Undressing Lower body dressing      What is the patient wearing?: Pants, Underwear/pull up     Lower body assist Assist for lower body dressing: Minimal Assistance - Patient > 75%     Toileting Toileting    Toileting assist Assist for toileting: Contact Guard/Touching assist     Transfers Chair/bed transfer  Transfers assist     Chair/bed transfer assist level: Contact Guard/Touching assist     Locomotion Ambulation   Ambulation assist   Ambulation activity did not occur: Safety/medical concerns (pain)  Assist level: Minimal Assistance - Patient > 75% Assistive device: Walker-rolling Max distance: 20'   Walk 10 feet activity   Assist  Walk 10 feet activity did not occur: Safety/medical concerns (pain)  Assist level: Minimal Assistance - Patient > 75% Assistive device: Walker-rolling   Walk 50 feet activity   Assist Walk 50 feet with 2  turns activity did not occur: Safety/medical concerns (pain)         Walk 150 feet activity   Assist Walk 150 feet activity did not occur: Safety/medical concerns (pain)         Walk 10 feet on uneven surface  activity   Assist Walk 10 feet on uneven surfaces activity did not occur: Safety/medical concerns (pain)         Wheelchair     Assist Is the patient using a wheelchair?: Yes Type of Wheelchair: Manual    Wheelchair assist level: Independent Max wheelchair distance: 113ft    Wheelchair 50 feet with 2 turns activity    Assist        Assist Level: Independent   Wheelchair 150 feet activity     Assist      Assist Level: Independent   Blood pressure 125/78, pulse 95,  temperature 98.1 F (36.7 C), temperature source Oral, resp. rate 18, height 5\' 7"  (1.702 m), weight 84.5 kg, SpO2 97%.  Medical Problem List and Plan: 1. 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  Antithrombotics: -DVT/anticoagulation:  Pharmaceutical: Eliquis             -antiplatelet therapy: N/A 3. Pain Management:  increased dilaudid to 4mg  for severe pain as this is only medicine that helps and pain continues to be 10/10 ,  hydrocodone 10/650 mg prn for severe pain --On robaxin 1000 mg TID does not seen to help will use  change to  flexeril 10mg  TID-12/22--increase gabapentin to 800mg  TID Given GFR 48 will dose 800mg  q 12 h Has not taken hydrocodone q6h,3 times yesterday , discussed she may take another time per day DIscussed with OT, may benefit from TENS unit, has PT next  Add lidoderm patch right post knee  4. Mood/Behavior/Sleep:  LCSW to follow for evaluation and support.              -antipsychotic agents: N/A 5. Neuropsych/cognition: This patient is capable of making decisions on her own behalf. 6. Skin/Wound Care: Routine pressure relief measures.  7. Fluids/Electrolytes/Nutrition: Monitor I/O. Check CMET in am 8.  H/o A flutter/PAF: Monitor HR TID--continue Cardizem and Apixaban             --monitor for symptoms with increase in activity.  9. T1DM: Hgb A1c-9.2                         --Continue Insulin glargline bid--reports compliance at home.   D/c ISS, decreased CBG checks to AC/HS CBG (last 3)  Recent Labs    01/26/23 2118 01/27/23 0332 01/27/23 0551  GLUCAP 113* 128* 108*  Controlled   10. Hyponatremia: Improved from 127 @ admission to 134             --Monitor for now 11. CKD?:  Per chart review SCr-1.56 on 08/24 and  1.33 @ admission.              --Monitor renal status with serial checks. Avoid nephrotoxic medications.   12. Acute on chronic  iron deficiency anemia:  Has been transfused with 3 units PRBCs.   --Add iron due to severe  iron deficiency --Continue to monitor H&H for stability.    --hx of menorrhagia/Fibroids-->on depo with good results PTA but has had recurrance w/clots past 3 days.  Repeat CBC tomorrow    Latest Ref Rng & Units 01/27/2023    5:10 AM 01/26/2023    5:15 AM 01/24/2023    5:22 AM  CBC  WBC 4.0 - 10.5 K/uL 15.5  14.1  13.8   Hemoglobin 12.0 - 15.0 g/dL 7.0  7.5  7.1   Hematocrit 36.0 - 46.0 % 25.6  26.8  24.8   Platelets 150 - 400 K/uL 389  300  257    Transfuse if <7.0- recheck in am , no dizziness with position changes, recheck in am    14. Obesity. BMI 34 on admission. Dietary counseling 15. Constipation. Miralax, senokot. 16. Hyperkalemia. Monitor labs    Latest Ref Rng & Units 01/26/2023    5:15 AM 01/24/2023    5:22 AM 01/23/2023    4:56 AM  BMP  Glucose 70 - 99 mg/dL 161  90  096   BUN 6 - 20 mg/dL 18  14  22    Creatinine 0.44 - 1.00 mg/dL 0.45  4.09  8.11   Sodium 135 - 145 mmol/L 134  137  134   Potassium 3.5 - 5.1 mmol/L 5.1  4.5  4.8   Chloride 98 - 111 mmol/L 101  104  100   CO2 22 - 32 mmol/L 23  24  23    Calcium 8.9 - 10.3 mg/dL 9.2  8.9  8.9    Recheck in am   17. HTN. Lisinopril and hydrochlorothiazide have been stopped. BP stable, continue to hold   Vitals:   01/26/23 1932 01/27/23 0335  BP: 122/74 125/78  Pulse: 89 95  Resp: 17 18  Temp: 98.2 F (36.8 C) 98.1 F (36.7 C)  SpO2: 99% 97%  Controlled   18. Blistering at incision site: Vibra scheduled  19. Leukocytosis: Vibra scheduled, repeat CBC ordered tomorrow  LOS: 4 days A FACE TO FACE EVALUATION WAS PERFORMED  Drema Pry Avantika Shere 01/27/2023, 9:36 AM

## 2023-01-27 NOTE — Progress Notes (Signed)
Patient ID: Jennifer [name], female   DOB: 1981-01-09, 42 y.o.   MRN: [id number]  Team Conference Report to Patient/Family  Team Conference discussion was reviewed with the patient and caregiver, including goals, any changes in plan of care and target discharge date.  Patient and caregiver express understanding and are in agreement.  The patient has a target discharge date of 02/03/23.  Sw met with patient to discuss conference updates and d/c date. Patient starting therapy session. Sw will continue to follow up.   Andria Rhein 01/27/2023, 1:27 PM

## 2023-01-27 NOTE — Patient Care Conference (Signed)
Inpatient RehabilitationTeam Conference and Plan of Care Update Date: 01/27/2023   Time: 12:43 PM    Patient Name: Jennifer [name]      Medical Record Number: [id number]  Date of Birth: 1980-04-06 Sex: Female         Room/Bed: 4M02C/4M02C-01 Payor Info: Payor: [place] MEDICAID PREPAID HEALTH PLAN / Plan: [place] MEDICAID HEALTHY BLUE / Product Type: *No Product type* /    Admit Date/Time:  01/23/2023  3:14 PM  Primary Diagnosis:  Unilateral complete BKA, right, initial encounter [facility])  Hospital Problems: Principal Problem:   Unilateral complete BKA, right, initial encounter [facility])    Expected Discharge Date: Expected Discharge Date: 02/03/23  Team Members Present: Social Worker Present: Lavera Guise, BSW Nurse Present: Chana Bode, RN PT Present: Midge Minium, PT OT Present: Candee Furbish, OT SLP Present: Feliberto Gottron, SLP PPS Coordinator present : Fae Pippin, SLP     Current Status/Progress Goal Weekly Team Focus  Bowel/Bladder   continent of b/b   maintain continence   offer toileting q4 hours and PRN    Swallow/Nutrition/ Hydration               ADL's   Set up A UB, Min A LB, CGA/min A toileting   Mod I   Barriers- high pain level, suspected contracture on back of Rt knee, compliance with optimal positioning for Rt residual limb, functional endurance    Mobility   bed mobility supervision, transfers with and without AD min A, WC mobility 156ft supervision   Mod I  barriers: pain management, limb loss education, stair navigation, DME, dynamic standing balance/coordination    Communication                Safety/Cognition/ Behavioral Observations               Pain   patient complains of 9/10 pain even with duladid and Norco in Right BKA. Patient more fatigued tonight only wakes to ask for pain medication.   <5/10 on pain scale   assess pain qshift and PRN offering non medication pain relief when possible.    Skin   Right BKA with blisters    keep incision clean infection free  assess incision qshift and PRN. educating patient on keeping incision clean and performing daily dressing changes      Discharge Planning:  Discharging home with spouse and daughter, 76. Spouse works until 530P. 1 level home, 5 steps.   Team Discussion: Patient post left BKA with phantom pain and urinary frequency, and anemia. MD adjusting neuropathic medications, awaiting urine culture and anticipates a need for transfusion by12/25/24.   Patient on target to meet rehab goals: yes, currently needs set up for upper body care  and min assist for lower body care with supervision for transfers and w/c mobility.  Needs CGA for toileting and shrinker use.  Goals for discharge set for supervision - mod I overall.  *See Care Plan and progress notes for long and short-term goals.   Revisions to Treatment Plan:  Blood transfusion   Teaching Needs: Safety, medications, skin care, transfers, toileting, etc.    Current Barriers to Discharge: Decreased caregiver support and Home enviroment access/layout  Possible Resolutions to Barriers: Family education Ramp for entry to home DME: TTB, RW, W/C, shower stool     Medical Summary Current Status: leukocytosis, blistering along incision site, uncontrolled pain, anemia, kidney injury  Barriers to Discharge: Medical stability;Complicated Wound;Symptomatic Anemia;Infection/IV Antibiotics;Renal Insufficiency/Failure  Barriers to Discharge Comments: leukocytosis, blistering  along incision site, uncontrolled pain, anemia, kidney injury Possible Resolutions to [employer] Focus: repeat CBC tomorrow,started on Vibra, increased dilaudid for severe pain to 4mg , increased gabapentin for neuropathic pain to 800mg  TID, repeat Hgb tomorrow, continue to monitor creatinine   Continued Need for Acute Rehabilitation Level of Care: The patient requires daily medical management by a physician with specialized training in  physical medicine and rehabilitation for the following reasons: Direction of a multidisciplinary physical rehabilitation program to maximize functional independence : Yes Medical management of patient stability for increased activity during participation in an intensive rehabilitation regime.: Yes Analysis of laboratory values and/or radiology reports with any subsequent need for medication adjustment and/or medical intervention. : Yes   I attest that I was present, lead the team conference, and concur with the assessment and plan of the team.   Chana Bode B 01/27/2023, 2:34 PM

## 2023-01-27 NOTE — Progress Notes (Signed)
Occupational Therapy Session Note  Patient Details  Name: Jennifer [name] MRN: [id number] Date of Birth: 1980-09-29  Today's Date: 01/27/2023 OT Individual Time: 1020-1115 & 1420-1500 OT Individual Time Calculation (min): 55 min & 40 min   Short Term Goals: Week 1:  OT Short Term Goal 1 (Week 1): STG = LTG due to ELOS  Skilled Therapeutic Interventions/Progress Updates:  Session 1 Skilled OT intervention completed with focus on functional transfers, DC planning and ADL retraining. Pt received upright in bed, agreeable to session. 10/10 pain reported in Rt residual limb with report of more phantom vs back of knee pain; pre-medicated. OT offered rest breaks, repositioning and moist heat throughout for pain reduction.  Transitioned to EOB with mod I. Completed all sit > stands and stand pivots with CGA/supervision using bed rail/RW or grab bar during session.  Transferred > w/c, dependent transport > bathroom, then transfer > BSC over toilet for toileting needs. Pt continent of urinary void only, with urine sample collected in hat; NT notified. Without OT readiness, pt did transfer herself back to w/c that was unlocked; pt verbalized "I just got ahead of myself." Discussed DC plans and ELOS, as pt is moving well but only ambulating very short distances, and has not tried stair navigation with shower stool method; plan to do this afternoon if able.  Seated at sink, pt was set up A for UB bathing/dressing, but required CGA in stance at sink for bathing and during dressing as pt using Lt hip on counter without UE support during task but excellent balance. Re-education needed for shrinker care and initiation cues to doff current one, wash it at sink and donn new clean one with donning tube method with min A only at the end to prevent pain with shrinker slipping off the tube.   Transfer back to toilet for urinary void. Supervision for all toileting steps including pad and underwear. Transfer back to  w/c, then transfer > EOB. Pt remained seated EOB, with bed alarm on/activated, and with all needs in reach at end of session.  Session 2 Skilled OT intervention completed with focus on family education. Pt received upright in bed, agreeable to session. Unrated pain reported in Rt residual limb; pre-medicated. OT offered rest breaks, repositioning throughout for pain reduction.  OT provided education on the following in prep for DC: -Confirmed bathroom set up, with pt reporting tub/shower. Advised that TTB would be safest considering CLOF, environmental set up and pt's balance/endurance deficits -Advised use of hand held shower head for ease of bathing. Demonstrated method of tucking shower curtain under buttocks when seated on TTB to prevent water spillage in floor. Discussed using lateral leans for peri-washing and use/installation of grab bars for balance -Energy conservation strategies- lukewarm water, proper ventilation via fan or cracked door to reduce steam, minimizing stands in shower especially during hair washing with eyes closed, and planning ahead for shower tasks to be rested/have rest time following in case of fatigue -coverage of residual limb until cleared by MD and staples removed  Pt completed bed mobility mod I, donned limb guard with min A, then completed all sit > stands, and ambulatory transfers with CGA using bed rail/RW/grab bar during session. Transferred > w/c > BSC over toilet > w/c. Toileting with supervision in stance including donning of pad.   Transported dependently in w/c > ADL bathroom. Pt able to return demo ambulatory transfer > TTB, then supervision pivot on tub bench. Similar exit > w/c. Transported > other gym.  OT demonstrated shower stool method for stair navigation and discussed the benefits/cons of this method with option of installing ramp if she wants to go outside unattended etc. Plan to trial with more time next session.  Back in room, pt transferred > EOB.  Pt remained seated EOB, with bed alarm on/activated, and with all needs in reach at end of session.   Therapy Documentation Precautions:  Precautions Precautions: Fall Restrictions Weight Bearing Restrictions Per Provider Order: Yes RLE Weight Bearing Per Provider Order: Non weight bearing    Therapy/Group: Individual Therapy  Melvyn Novas, MS, OTR/L  01/27/2023, 3:03 PM

## 2023-01-27 NOTE — Discharge Instructions (Signed)

## 2023-01-27 NOTE — Progress Notes (Signed)
Physical Therapy Session Note  Patient Details  Name: Jennifer [name] MRN: [id number] Date of Birth: [birth year]  Today's Date: 01/27/2023 PT Individual Time: 4098-1191 + 4782-9562 PT Individual Time Calculation (min): 40 min + 40 min  Short Term Goals: Week 1:  PT Short Term Goal 1 (Week 1): STG=LTG due to LOS  Skilled Therapeutic Interventions/Progress Updates:    Session 1: Chart reviewed and pt agreeable to therapy. Pt received seated EOB with 10/10 c/o pain at surgical. Also of note, RN in room for administration of pain medication. Session focused on self management of limb guard, set up fpr OOB mobility, functional transfers, and amb to promote independent home access. Pt initiated session with donning of limb guard with pt requiring set up assist. Pt then talked through [facility] set up by bed in home, with pt displaying good ability to safely set up WC. Pt then completed 158ft WC propulsion with sup. Pt completed car transfer x2 using CGA fading to sup + no AD. Pt note to require VC for safe WC set up at car door. Pt then returned to room with same assist for [facility] propulsion. Pt then completed 53ft amb x2 using CGA + RW fading to sup + RW. Pt then transferred to recliner from [facility] and required same VC support for safe WC set up before transfer and sup + no AD for SPT. Pt then amb to bed with sup + no AD. In bed, pt compelted blocked practice of isometric quad contraction with VC and LLE demonstration needed for facilitation.  Session education emphasized safe limb placement to avoid excessive swelling and need to practice quad isometric contractions for avoid contracture. At end of session, pt was left seated in bed with alarm engaged, nurse call bell and all needs in reach.  Session 2: Chart reviewed and pt agreeable to therapy. Pt received reclined in bed with 9/10 c/o pain. Session focused on functional transfers and dynamic standing balance to promote safe home access. Pt initiated session with  donning of limb guard with set up A. Pt then completed WC setup for SPT with sup. Pt completed SPT using sup + np AD. Pt then completed 234ft WC propulsion with sup. In therapy gymk pt comkpleted blocked practice of standing reaches outside BOS with CGA + RW. Pt noted to have increased unsteadiness with R UE reaching activitrites. Pt then requested toileting. Pt returned to room with same assist level for WC propulsion. In room, pt amb to toilet and then bed using close sup + RW. Session education emphasized continued quad isometrics. At end of session, pt was left seated EOB with alarm engaged, nurse call bell and all needs in reach.     Therapy Documentation Precautions:  Precautions Precautions: Fall Restrictions Weight Bearing Restrictions Per Provider Order: Yes RLE Weight Bearing Per Provider Order: Non weight bearing General:       Therapy/Group: Individual Therapy  Dionne Milo, PT, DPT 01/27/2023, 9:25 AM

## 2023-01-27 NOTE — Plan of Care (Signed)
  Problem: RH SKIN INTEGRITY Goal: RH STG SKIN FREE OF INFECTION/BREAKDOWN Description: Skin to remain free from breakdown and incision to heal with no signs of infection with mod I assist. Outcome: Progressing Goal: RH STG MAINTAIN SKIN INTEGRITY WITH ASSISTANCE Description: STG Maintain Skin Integrity With mod I Assistance. Outcome: Progressing Goal: RH STG ABLE TO PERFORM INCISION/WOUND CARE W/ASSISTANCE Description: STG Able To Perform Incision/Wound Care With mod I Assistance. Outcome: Progressing

## 2023-01-27 NOTE — Progress Notes (Signed)
Patient ID: Jennifer [name], female   DOB: 11-Jun-1980, 42 y.o.   MRN: [id number]  Family education arranged Saturday 9a-12p.

## 2023-01-28 DIAGNOSIS — S88112S Complete traumatic amputation at level between knee and ankle, left lower leg, sequela: Secondary | ICD-10-CM | POA: Diagnosis not present

## 2023-01-28 LAB — CBC WITH DIFFERENTIAL/PLATELET
Abs Immature Granulocytes: 0.22 10*3/uL — ABNORMAL HIGH (ref 0.00–0.07)
Basophils Absolute: 0.1 10*3/uL (ref 0.0–0.1)
Basophils Relative: 0 %
Eosinophils Absolute: 0.5 10*3/uL (ref 0.0–0.5)
Eosinophils Relative: 4 %
HCT: 24.3 % — ABNORMAL LOW (ref 36.0–46.0)
Hemoglobin: 6.8 g/dL — CL (ref 12.0–15.0)
Immature Granulocytes: 2 %
Lymphocytes Relative: 20 %
Lymphs Abs: 2.7 10*3/uL (ref 0.7–4.0)
MCH: 20 pg — ABNORMAL LOW (ref 26.0–34.0)
MCHC: 28 g/dL — ABNORMAL LOW (ref 30.0–36.0)
MCV: 71.5 fL — ABNORMAL LOW (ref 80.0–100.0)
Monocytes Absolute: 1 10*3/uL (ref 0.1–1.0)
Monocytes Relative: 7 %
Neutro Abs: 9.2 10*3/uL — ABNORMAL HIGH (ref 1.7–7.7)
Neutrophils Relative %: 67 %
Platelets: 427 10*3/uL — ABNORMAL HIGH (ref 150–400)
RBC: 3.4 MIL/uL — ABNORMAL LOW (ref 3.87–5.11)
RDW: 26.2 % — ABNORMAL HIGH (ref 11.5–15.5)
WBC: 13.6 10*3/uL — ABNORMAL HIGH (ref 4.0–10.5)
nRBC: 0 % (ref 0.0–0.2)

## 2023-01-28 LAB — URINE CULTURE: Culture: 20000 — AB

## 2023-01-28 LAB — GLUCOSE, CAPILLARY
Glucose-Capillary: 137 mg/dL — ABNORMAL HIGH (ref 70–99)
Glucose-Capillary: 135 mg/dL — ABNORMAL HIGH (ref 70–99)
Glucose-Capillary: 100 mg/dL — ABNORMAL HIGH (ref 70–99)
Glucose-Capillary: 87 mg/dL (ref 70–99)
Glucose-Capillary: 117 mg/dL — ABNORMAL HIGH (ref 70–99)

## 2023-01-28 LAB — PREPARE RBC (CROSSMATCH)

## 2023-01-28 MED ORDER — INSULIN ASPART 100 UNIT/ML IJ SOLN
11.0000 [IU] | Freq: Two times a day (BID) | INTRAMUSCULAR | Status: DC
  Administered 2023-01-28 – 2023-01-29 (×2): 11 [IU] via SUBCUTANEOUS

## 2023-01-28 MED ORDER — MAGNESIUM GLUCONATE 500 MG PO TABS
250.0000 mg | ORAL_TABLET | Freq: Every day | ORAL | Status: DC
  Administered 2023-01-28 – 2023-02-04 (×8): 250 mg via ORAL
  Filled 2023-01-28 (×8): qty 1

## 2023-01-28 MED ORDER — DIPHENHYDRAMINE HCL 25 MG PO CAPS
25.0000 mg | ORAL_CAPSULE | Freq: Once | ORAL | Status: AC
  Administered 2023-01-28: 25 mg via ORAL
  Filled 2023-01-28: qty 1

## 2023-01-28 MED ORDER — MAGNESIUM HYDROXIDE 400 MG/5ML PO SUSP
15.0000 mL | Freq: Once | ORAL | Status: AC
  Administered 2023-01-28: 15 mL via ORAL
  Filled 2023-01-28: qty 30

## 2023-01-28 MED ORDER — SODIUM CHLORIDE 0.9% IV SOLUTION: Freq: Once | INTRAVENOUS | Status: AC

## 2023-01-28 MED ORDER — SENNA 8.6 MG PO TABS
2.0000 | ORAL_TABLET | Freq: Every day | ORAL | Status: DC
  Administered 2023-01-29 – 2023-02-04 (×7): 17.2 mg via ORAL
  Filled 2023-01-28 (×7): qty 2

## 2023-01-28 MED ORDER — ACETAMINOPHEN 325 MG PO TABS
650.0000 mg | ORAL_TABLET | Freq: Once | ORAL | Status: AC
  Administered 2023-01-28: 650 mg via ORAL
  Filled 2023-01-28: qty 2

## 2023-01-28 MED ORDER — GABAPENTIN 400 MG PO CAPS
800.0000 mg | ORAL_CAPSULE | Freq: Four times a day (QID) | ORAL | Status: DC
  Administered 2023-01-28 – 2023-01-30 (×8): 800 mg via ORAL
  Filled 2023-01-28 (×8): qty 2

## 2023-01-28 MED ORDER — FUROSEMIDE 10 MG/ML IJ SOLN
20.0000 mg | Freq: Once | INTRAMUSCULAR | Status: AC
  Administered 2023-01-28: 20 mg via INTRAVENOUS
  Filled 2023-01-28: qty 2

## 2023-01-28 NOTE — Progress Notes (Addendum)
PROGRESS NOTE   Subjective/Complaints: Hgb 6.8, PRBC ordered and repeat CBC ordered for tomorrow WBC decreased No issues overnight  ROS: +pain in residual limb and phantom limb pain- improved, +urinary frequency  Objective:   No results found.  Recent Labs    01/27/23 0510 01/28/23 0524  WBC 15.5* 13.6*  HGB 7.0* 6.8*  HCT 25.6* 24.3*  PLT 389 427*   Recent Labs    01/26/23 0515  NA 134*  K 5.1  CL 101  CO2 23  GLUCOSE 104*  BUN 18  CREATININE 1.31*  CALCIUM 9.2    Intake/Output Summary (Last 24 hours) at 01/28/2023 0919 Last data filed at 01/27/2023 1807 Gross per 24 hour  Intake 354 ml  Output --  Net 354 ml        Physical Exam: Vital Signs Blood pressure 126/70, pulse 96, temperature 98.2 F (36.8 C), temperature source Oral, resp. rate 18, height 5\' 7"  (1.702 m), weight 84.5 kg, SpO2 95%.  In therapy in prone position  General: No acute distress, awake and alert Mood and affect are appropriate Ext no thigh swelling or ecchymosis Lungs: Clear to auscultation, breathing unlabored, no rales or wheezes  Skin: +blistering around incision site RIght BKA dressing just changed,pt did not want it unwrapped again this am, exam stable 12/25  Musculoskeletal: Right BKA  No joint swelling   Assessment/Plan: 1. Functional deficits which require 3+ hours per day of interdisciplinary therapy in a comprehensive inpatient rehab setting. Physiatrist is providing close team supervision and 24 hour management of active medical problems listed below. Physiatrist and rehab team continue to assess barriers to discharge/monitor patient progress toward functional and medical goals  Care Tool:  Bathing    Body parts bathed by patient: Right arm, Left arm, Chest, Abdomen, Front perineal area, Buttocks, Right upper leg, Left upper leg, Face   Body parts bathed by helper: Left lower leg Body parts n/a: Right  lower leg   Bathing assist Assist Level: Minimal Assistance - Patient > 75%     Upper Body Dressing/Undressing Upper body dressing   What is the patient wearing?: Pull over shirt    Upper body assist Assist Level: Independent    Lower Body Dressing/Undressing Lower body dressing      What is the patient wearing?: Pants, Underwear/pull up     Lower body assist Assist for lower body dressing: Minimal Assistance - Patient > 75%     Toileting Toileting    Toileting assist Assist for toileting: Contact Guard/Touching assist     Transfers Chair/bed transfer  Transfers assist     Chair/bed transfer assist level: Contact Guard/Touching assist     Locomotion Ambulation   Ambulation assist   Ambulation activity did not occur: Safety/medical concerns (pain)  Assist level: Minimal Assistance - Patient > 75% Assistive device: Walker-rolling Max distance: 20'   Walk 10 feet activity   Assist  Walk 10 feet activity did not occur: Safety/medical concerns (pain)  Assist level: Minimal Assistance - Patient > 75% Assistive device: Walker-rolling   Walk 50 feet activity   Assist Walk 50 feet with 2 turns activity did not occur: Safety/medical concerns (pain)  Walk 150 feet activity   Assist Walk 150 feet activity did not occur: Safety/medical concerns (pain)         Walk 10 feet on uneven surface  activity   Assist Walk 10 feet on uneven surfaces activity did not occur: Safety/medical concerns (pain)         Wheelchair     Assist Is the patient using a wheelchair?: Yes Type of Wheelchair: Manual    Wheelchair assist level: Independent Max wheelchair distance: 149ft    Wheelchair 50 feet with 2 turns activity    Assist        Assist Level: Independent   Wheelchair 150 feet activity     Assist      Assist Level: Independent   Blood pressure 126/70, pulse 96, temperature 98.2 F (36.8 C), temperature source  Oral, resp. rate 18, height 5\' 7"  (1.702 m), weight 84.5 kg, SpO2 95%.  Medical Problem List and Plan: 1. 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  Antithrombotics: -DVT/anticoagulation:  Pharmaceutical: Eliquis             -antiplatelet therapy: N/A 3. Pain Management:  increased dilaudid to 4mg  for severe pain as this is only medicine that helps and pain continues to be 10/10 ,  hydrocodone 10/650 mg prn for severe pain --On robaxin 1000 mg TID does not seen to help will use  change to  flexeril 10mg  TID-12/22--increase gabapentin to 800mg  TID Given GFR 48 will dose 800mg  q 12 h Has not taken hydrocodone q6h,3 times yesterday , discussed she may take another time per day DIscussed with OT, may benefit from TENS unit, has PT next  Add lidoderm patch right post knee  4. Mood/Behavior/Sleep:  LCSW to follow for evaluation and support.              -antipsychotic agents: N/A 5. Neuropsych/cognition: This patient is capable of making decisions on her own behalf. 6. Skin/Wound Care: Routine pressure relief measures.  7. Fluids/Electrolytes/Nutrition: Monitor I/O. Check CMET in am 8.  H/o A flutter/PAF: Monitor HR TID--continue Cardizem and Apixaban             --monitor for symptoms with increase in activity.  9. T1DM: Hgb A1c-9.2                         --Continue Insulin glargline bid--reports compliance at home.   D/c ISS, decreased CBG checks to AC/HS CBG (last 3)  Recent Labs    01/27/23 2101 01/28/23 0333 01/28/23 0603  GLUCAP 137* 137* 135*  Controlled   10. Hyponatremia: Improved from 127 @ admission to 134             --Monitor for now 11. CKD?:  Per chart review SCr-1.56 on 08/24 and  1.33 @ admission.              --Monitor renal status with serial checks. Avoid nephrotoxic medications.  12. Acute on chronic  iron deficiency anemia:   Has been transfused with 4 units PRBCs, latest 12/25 -iron d/ced due to constipation --Continue to monitor H&H for stability.    --hx of menorrhagia/Fibroids-->on depo with good results  PTA but has had recurrance w/clots past 3 days.  Repeat CBC tomorrow    Latest Ref Rng & Units 01/28/2023    5:24 AM 01/27/2023    5:10 AM 01/26/2023    5:15 AM  CBC  WBC 4.0 - 10.5 K/uL 13.6  15.5  14.1   Hemoglobin 12.0 - 15.0 g/dL 6.8  C 7.0  7.5   Hematocrit 36.0 - 46.0 % 24.3  25.6  26.8   Platelets 150 - 400 K/uL 427  389  300     C Corrected result   Transfuse if <7.0- recheck in am , no dizziness with position changes, recheck in am    14. Obesity. BMI 34 on admission. Dietary counseling 15. Constipation. Miralax, senokot. Changed senna to 2 tabs HS, milk of magnesia ordered 12/25 16. Hyperkalemia. Monitor labs    Latest Ref Rng & Units 01/26/2023    5:15 AM 01/24/2023    5:22 AM 01/23/2023    4:56 AM  BMP  Glucose 70 - 99 mg/dL 191  90  478   BUN 6 - 20 mg/dL 18  14  22    Creatinine 0.44 - 1.00 mg/dL 2.95  6.21  3.08   Sodium 135 - 145 mmol/L 134  137  134   Potassium 3.5 - 5.1 mmol/L 5.1  4.5  4.8   Chloride 98 - 111 mmol/L 101  104  100   CO2 22 - 32 mmol/L 23  24  23    Calcium 8.9 - 10.3 mg/dL 9.2  8.9  8.9    Recheck in am   17. HTN. Lisinopril and hydrochlorothiazide have been stopped. BP stable, continue to hold, magnesium supplement started HS   Vitals:   01/28/23 0337 01/28/23 0440  BP: 135/74 126/70  Pulse: 100 96  Resp: 18   Temp: 98.1 F (36.7 C) 98.2 F (36.8 C)  SpO2: 92% 95%  Controlled   18. Blistering at incision site: Vibra scheduled  19. Leukocytosis: Vibra scheduled, reviewed and improved  LOS: 5 days A FACE TO FACE EVALUATION WAS PERFORMED  Drema Pry Azalynn Maxim 01/28/2023, 9:19 AM

## 2023-01-28 NOTE — Progress Notes (Signed)
Orthopedic Tech Progress Note Patient Details:  LACOSTA AX [birth year] [id number] 3XL BK Shrinker has been ordered from [facility]  Patient ID: Zack Seal, female   DOB: 1980/03/03, 42 y.o.   MRN: [id number]  Smitty Pluck 01/28/2023, 10:13 AM

## 2023-01-29 DIAGNOSIS — S88112S Complete traumatic amputation at level between knee and ankle, left lower leg, sequela: Secondary | ICD-10-CM | POA: Diagnosis not present

## 2023-01-29 DIAGNOSIS — T8789 Other complications of amputation stump: Secondary | ICD-10-CM

## 2023-01-29 LAB — TYPE AND SCREEN
ABO/RH(D): B POS
Antibody Screen: NEGATIVE
Unit division: 0
Unit division: 0

## 2023-01-29 LAB — GLUCOSE, CAPILLARY
Glucose-Capillary: 113 mg/dL — ABNORMAL HIGH (ref 70–99)
Glucose-Capillary: 81 mg/dL (ref 70–99)
Glucose-Capillary: 68 mg/dL — ABNORMAL LOW (ref 70–99)
Glucose-Capillary: 74 mg/dL (ref 70–99)
Glucose-Capillary: 117 mg/dL — ABNORMAL HIGH (ref 70–99)
Glucose-Capillary: 106 mg/dL — ABNORMAL HIGH (ref 70–99)

## 2023-01-29 LAB — CBC WITH DIFFERENTIAL/PLATELET
Abs Immature Granulocytes: 0.3 10*3/uL — ABNORMAL HIGH (ref 0.00–0.07)
Basophils Absolute: 0.1 10*3/uL (ref 0.0–0.1)
Basophils Relative: 1 %
Eosinophils Absolute: 0.5 10*3/uL (ref 0.0–0.5)
Eosinophils Relative: 4 %
HCT: 29.5 % — ABNORMAL LOW (ref 36.0–46.0)
Hemoglobin: 8.7 g/dL — ABNORMAL LOW (ref 12.0–15.0)
Immature Granulocytes: 2 %
Lymphocytes Relative: 22 %
Lymphs Abs: 2.8 10*3/uL (ref 0.7–4.0)
MCH: 21.6 pg — ABNORMAL LOW (ref 26.0–34.0)
MCHC: 29.5 g/dL — ABNORMAL LOW (ref 30.0–36.0)
MCV: 73.4 fL — ABNORMAL LOW (ref 80.0–100.0)
Monocytes Absolute: 1 10*3/uL (ref 0.1–1.0)
Monocytes Relative: 8 %
Neutro Abs: 8.5 10*3/uL — ABNORMAL HIGH (ref 1.7–7.7)
Neutrophils Relative %: 63 %
Platelets: 566 10*3/uL — ABNORMAL HIGH (ref 150–400)
RBC: 4.02 MIL/uL (ref 3.87–5.11)
RDW: 23.7 % — ABNORMAL HIGH (ref 11.5–15.5)
WBC: 13.1 10*3/uL — ABNORMAL HIGH (ref 4.0–10.5)
nRBC: 0.2 % (ref 0.0–0.2)

## 2023-01-29 LAB — BASIC METABOLIC PANEL
Anion gap: 10 (ref 5–15)
BUN: 23 mg/dL — ABNORMAL HIGH (ref 6–20)
CO2: 25 mmol/L (ref 22–32)
Calcium: 9.1 mg/dL (ref 8.9–10.3)
Chloride: 96 mmol/L — ABNORMAL LOW (ref 98–111)
Creatinine, Ser: 1.84 mg/dL — ABNORMAL HIGH (ref 0.44–1.00)
GFR, Estimated: 34 mL/min — ABNORMAL LOW (ref >60)
Glucose, Bld: 99 mg/dL (ref 70–99)
Potassium: 5.2 mmol/L — ABNORMAL HIGH (ref 3.5–5.1)
Sodium: 131 mmol/L — ABNORMAL LOW (ref 135–145)

## 2023-01-29 LAB — BPAM RBC
Blood Product Expiration Date: 202501112359
Blood Product Expiration Date: 202501112359
ISSUE DATE / TIME: 202412251009
ISSUE DATE / TIME: 202412251317
Unit Type and Rh: 7300
Unit Type and Rh: 7300

## 2023-01-29 MED ORDER — BACLOFEN 5 MG HALF TABLET
5.0000 mg | ORAL_TABLET | Freq: Three times a day (TID) | ORAL | Status: DC | PRN
  Administered 2023-01-29 – 2023-01-30 (×2): 5 mg via ORAL
  Filled 2023-01-29 (×2): qty 1

## 2023-01-29 MED ORDER — MORPHINE SULFATE ER 15 MG PO TBCR
15.0000 mg | EXTENDED_RELEASE_TABLET | Freq: Two times a day (BID) | ORAL | Status: DC
  Administered 2023-01-29 – 2023-02-02 (×9): 15 mg via ORAL
  Filled 2023-01-29 (×9): qty 1

## 2023-01-29 MED ORDER — INSULIN ASPART 100 UNIT/ML IJ SOLN
10.0000 [IU] | Freq: Two times a day (BID) | INTRAMUSCULAR | Status: DC
  Administered 2023-01-29 – 2023-01-30 (×2): 10 [IU] via SUBCUTANEOUS

## 2023-01-29 MED ORDER — HEPARIN (PORCINE) 25000 UT/250ML-% IV SOLN
1900.0000 [IU]/h | INTRAVENOUS | Status: AC
  Administered 2023-01-29: 1250 [IU]/h via INTRAVENOUS
  Administered 2023-01-30: 1500 [IU]/h via INTRAVENOUS
  Administered 2023-01-31: 1700 [IU]/h via INTRAVENOUS
  Administered 2023-01-31 – 2023-02-04 (×8): 1900 [IU]/h via INTRAVENOUS
  Filled 2023-01-29 (×12): qty 250

## 2023-01-29 NOTE — Progress Notes (Signed)
Physical Therapy Session Note  Patient Details  Name: Jennifer [name] MRN: [id number] Date of Birth: March 26, 1980  Today's Date: 01/29/2023 PT Individual Time: 0730-0825 and 1300-1340 PT Individual Time Calculation (min): 55 min and 40 min  Short Term Goals: Week 1:  PT Short Term Goal 1 (Week 1): STG=LTG due to LOS  Skilled Therapeutic Interventions/Progress Updates:   Treatment Session 1 Received pt semi-reclined in bed, pt agreeable to PT treatment, and reported pain 20/10 in R residual limb - RN notified and present to administer pain medication. Session with emphasis on functional mobility/transfers, generalized strengthening and endurance, stair navigation, and gait training. Pt transferred semi-reclined<>sitting R EOB with HOB elevated and mod I. Pt transferred bed<>WC via stand<>pivot without AD and CGA and sat in WC at sink and brushed teeth/washed face with set up assist.   Noted no limb guard in room - secure chatted MD to request size medium while discussing DME for discharge - pt will need RW and 18x16 manual WC with R elevating legrest. Pt performed WC mobility 1110ft x 2 trials using BUE and mod I to/from main therapy gym with emphasis on UE strength. Pt reports having 4 STE with R handrail - demonstrated technique for stair navigation using shower chair and pt transferred WC<>shower chair via stand<>pivot with RW and CGA and ascended 1 6in step and descended 2 6in steps using R handrail and shower chair with mod A with therapist moving shower chair. Pt limited by fear of falling and politely declined continuing. Discussed getting ramp for home entry and pt reports her and her husband have already discussed this, but pt agreeable to continuing practicing stair navigation with shower chair while here.   Stood with RW and CGA and ambulated 23ft with RW and CGA fading to close supervision, pt experienced 1 posterior LOB requiring mod A to control descent into WC. Pt shaken up from LOB  walking and from fear from stair navigation and declined any further standing/walking this morning. Provided pt with information on [employer], returned to room, and transferred WC<>bed via stand<>pivot using bedrail and CGA with cues for hand placement. Concluded session with pt sitting EOB, needs within reach, and bed alarm on. CSW present at bedside to discuss DME.    Treatment Session 2 Received pt sitting upright in bed, pt agreeable to PT treatment, and reported pain 10/10 in R residual limb (premedicated). Session with emphasis on functional mobility/transfers, generalized strengthening and endurance, and mirror therapy. Pt performed bed mobility mod I using bed features and donned/doffed limb guard with set up assist (unable to tolerate black strap around knee). Pt transferred bed<>WC stand<>pivot without AD and CGA and transported to/from room in [facility] dependently for time management purposes.   Stood with RW and CGA and ambulated 56ft with RW and CGA to mat. Worked on Ship broker therapy - instructed pt to "maintain eye contact with mirror" and to "try to convince yourself that the mirror image is your R leg". Pt worked on the following activities/exercises with max cues to maintain eye contact in mirror during entire duration of treatment:  -ankle circles x20 clockwise and counterclockwise -LAQ 2x10 keeping mirror in visual range -sensory stimulation on LLE using washcloth -touching toes to mirror 2x10 -sensory simulation rubbing heat pack over LLE Pt reported decrease in pain from 10/10 to 8/10 afterwards. Pt transferred mat<>WC via stand<>pivot with CGA and returned to room. Pt reported urge to void and transferred WC<>toilet with bedside commode over top via stand<>pivot using  grab bar with CGA. Pt required assist to hold dress while doffing underwear. Pt left seated on commode left in care of NT for toileting.   Therapy Documentation Precautions:  Precautions Precautions:  Fall Restrictions Weight Bearing Restrictions Per Provider Order: Yes RLE Weight Bearing Per Provider Order: Non weight bearing  Therapy/Group: Individual Therapy Marlana Salvage Zaunegger Blima Rich PT, DPT 01/29/2023, 6:55 AM

## 2023-01-29 NOTE — Progress Notes (Signed)
Patient ID: Jennifer [name], female   DOB: [birth year], 42 y.o.   MRN: [id number]  SW met with pt in room to discuss DME: TTB, RW, w/c, and shower stool. Pt aware therapies deferred until prosthesis. Pt asked questions in relation to disability. SW explained process to initiate.   SW ordered DME with [employer] via parachute.   Cecile Sheerer, MSW, LCSW Office: [phone] Cell: [phone] Fax: [phone]

## 2023-01-29 NOTE — Progress Notes (Signed)
Hypoglycemic Event  CBG: 68  Treatment: 4 oz juice/soda  Symptoms: None  Follow-up CBG: Time:1726 CBG Result:74  Possible Reasons for Event: Unknown  Comments/MD notified:Pamela love, PA     Elgie Congo

## 2023-01-29 NOTE — Progress Notes (Signed)
Patient continues to have pain in stump with ischemic changes. She has had areas of blistering and developed new blistered area on flap overnight. Dr. Driscilla Grammes office closed today post holiday. Will reach out to vascular in house to see if they have any recommendations.    Taken on 12/23                                                Taken 12/26

## 2023-01-29 NOTE — Progress Notes (Addendum)
Physical Therapy Session Note  Patient Details  Name: Jennifer [name] MRN: [id number] Date of Birth: 08/22/1980  Today's Date: 01/29/2023 PT Individual Time: 4098-1191 PT Individual Time Calculation (min): 47 min   Short Term Goals: Week 1:  PT Short Term Goal 1 (Week 1): STG=LTG due to LOS  Skilled Therapeutic Interventions/Progress Updates:   When PT arrived to tx, pt was in bed. Agreeable to PT tx. Shrinker noted to be on.  Reports pain to be 9/10 pain as phantom limb pain. Nursing aware and had given pt pain medication 15 min prior to tx per patient. Pt reports phantom limb pain that has been persisting extending up to inguinal crease.   Therex: W/C mobility forward and backward Ues only with and without PT resistance x 162 ft x 2 bouts each direction. Quad isometrics performed in bed and in w/c x 10 ea location with PT discussion of importance of positioning of R LE due to risk of contracture. Stand pivot transfer with and without RW with PT CGA w/c to bed to w/c  and pt ability to perform safely. Pt reports R quad spasming mid quad. PT applied light pressure to mid Quad while patient performed R SAQ x 20; R SLR x 20 with concentration on knee extension.   Pt continued to report R phantom leg pain and R leg sensation of dryness distal BKA above staples. PT asked if anyone had looked at bottom of R stump for dry patch; pt reports no. PT removed shrinker and noticed R red circular area and small clear drainage below one of staples. PT notified Nursing who came and looked at leg. Nursing noted, as with PT, to have small circular red area on front of distal stump but that reports of drainage are clear. Pt was asked to pick up stump with dark area noted on back of leg along with blister. Nursing to notify MD. Shrinker left off at this time.   Pt left supine in bed. Continued complaints of phantom limb pain with nursing aware. All needs within reach. Bed alarm on.      Therapy  Documentation Precautions:  Precautions Precautions: Fall Restrictions Weight Bearing Restrictions Per Provider Order: Yes RLE Weight Bearing Per Provider Order: Non weight bearing  Therapy/Group: Individual Therapy  Luna Fuse 01/29/2023, 7:29 AM

## 2023-01-29 NOTE — Progress Notes (Signed)
Occupational Therapy Session Note  Patient Details  Name: Jennifer [name] MRN: [id number] Date of Birth: 1980-10-24  Today's Date: 01/29/2023 OT Individual Time: 1121-1200 OT Individual Time Calculation (min): 39 min    Short Term Goals: Week 1:  OT Short Term Goal 1 (Week 1): STG = LTG due to ELOS  Skilled Therapeutic Interventions/Progress Updates:    Pt received laying down, grimacing in pain from R quad cramp and phantom pain into R "foot"- 9/10. Muscle was tight to the touch. Assisted pt into sidelying to provide gentle stretch to alleviate cramp. This was unsuccessful in pain relief but RN provided pain medication.Worked on R knee extension with gentle facilitation from OT. She came to EOB with close (S). Mirror was obtained and pt was guided through mirror therapy for phantom pain relief. She required min cueing for attention to task, reporting being sleepy. D/t large blister on residual limb, did not feel comfortable donning limb guard or shrinker so pt returned to supine in bed. She completed BLE hip flexion exercises with cueing for proper technique as well as core stabilization for generalized strengthening for carryover to ADL transfers. 3x10 repetitions. She was left supine with all needs met, bed alarm set.   Therapy Documentation Precautions:  Precautions Precautions: Fall Restrictions Weight Bearing Restrictions Per Provider Order: Yes RLE Weight Bearing Per Provider Order: Non weight bearing   Therapy/Group: Individual Therapy  Crissie Reese 01/29/2023, 6:55 AM

## 2023-01-29 NOTE — Plan of Care (Signed)
  Problem: Consults Goal: RH LIMB LOSS PATIENT EDUCATION Description: Description: See Patient Education module for eduction specifics. Outcome: Progressing Goal: Skin Care Protocol Initiated - if Braden Score 18 or less Description: If consults are not indicated, leave blank or document N/A Outcome: Progressing Goal: Diabetes Guidelines if Diabetic/Glucose > 140 Description: If diabetic or lab glucose is > 140 mg/dl - Initiate Diabetes/Hyperglycemia Guidelines & Document Interventions  Outcome: Progressing   Problem: RH SKIN INTEGRITY Goal: RH STG SKIN FREE OF INFECTION/BREAKDOWN Description: Skin to remain free from breakdown and incision to heal with no signs of infection with mod I assist. Outcome: Progressing Goal: RH STG MAINTAIN SKIN INTEGRITY WITH ASSISTANCE Description: STG Maintain Skin Integrity With mod I Assistance. Outcome: Progressing Goal: RH STG ABLE TO PERFORM INCISION/WOUND CARE W/ASSISTANCE Description: STG Able To Perform Incision/Wound Care With mod I Assistance. Outcome: Progressing   Problem: RH SAFETY Goal: RH STG ADHERE TO SAFETY PRECAUTIONS W/ASSISTANCE/DEVICE Description: STG Adhere to Safety Precautions With mod I Assistance/Device. Outcome: Progressing Goal: RH STG DECREASED RISK OF FALL WITH ASSISTANCE Description: STG Decreased Risk of Fall With mod I Assistance. Outcome: Progressing   Problem: RH PAIN MANAGEMENT Goal: RH STG PAIN MANAGED AT OR BELOW PT'S PAIN GOAL Description: < 4 on a 0-10 pain scale Outcome: Progressing   Problem: RH KNOWLEDGE DEFICIT LIMB LOSS Goal: RH STG INCREASE KNOWLEDGE OF SELF CARE AFTER LIMB LOSS Description: Patient will demonstrate knowledge of residual limb care, diabetes management, safety awareness with educational handouts and materials provided by staff during rehabilitation admission. Outcome: Progressing   Problem: Education: Goal: Ability to describe self-care measures that may prevent or decrease  complications (Diabetes Survival Skills Education) will improve Outcome: Progressing Goal: Individualized Educational Video(s) Outcome: Progressing   Problem: Coping: Goal: Ability to adjust to condition or change in health will improve Outcome: Progressing   Problem: Fluid Volume: Goal: Ability to maintain a balanced intake and output will improve Outcome: Progressing   Problem: Health Behavior/Discharge Planning: Goal: Ability to identify and utilize available resources and services will improve Outcome: Progressing Goal: Ability to manage health-related needs will improve Outcome: Progressing   Problem: Metabolic: Goal: Ability to maintain appropriate glucose levels will improve Outcome: Progressing   Problem: Nutritional: Goal: Maintenance of adequate nutrition will improve Outcome: Progressing Goal: Progress toward achieving an optimal weight will improve Outcome: Progressing   Problem: Skin Integrity: Goal: Risk for impaired skin integrity will decrease Outcome: Progressing   Problem: Tissue Perfusion: Goal: Adequacy of tissue perfusion will improve Outcome: Progressing

## 2023-01-29 NOTE — Consult Note (Addendum)
[facility] NOTE   MRN : [id number]  Reason for Consult: right BKA with ischemic skin changes Referring Physician: Charisse March NP  History of Present Illness: This is a 42 y.o. female who presents to [facility] emergency department with complaints of worsening right foot pain. She has a past medical history of type 2 diabetes, CAD, and heavy tobacco abuse who presents complaining of of right lower leg pain.  Dr. Wyn Quaker was consulted no pedal pulses and no doppler signals in the right foot.  Right foot was cold to touch.    01/15/23 angiogram showed Right Lower Extremity:  This demonstrated minimal atherosclerosis but there was a thrombotic/embolic near occlusive stenosis in the mid popliteal artery.  The distal popliteal artery normalized in both the anterior tibial and posterior tibial arteries were initially patent but both occluded in the mid to distal segments and this was clearly thrombotic in nature.  Essentially no flow was seen in the foot.  On 01/17/23 Dr. Wallace Cullens performed right BKA.    She was admitted to [facility] 01/23/23 for continued rehab due to functional deficits.  The right BKA appeared healthy and healing well at admission.    She is on Eliquis for Afib and on angiogram she was thought to have had an embolic event.  She denies history of claudication, rest pain or non healing wounds prior to this event.       Current Facility-Administered Medications  Medication Dose Route Frequency Provider Last Rate Last Admin   acetaminophen (TYLENOL) tablet 325-650 mg  325-650 mg Oral Q4H PRN Jacquelynn Cree, PA-C   325 mg at 01/29/23 0746   alum & mag hydroxide-simeth (MAALOX/MYLANTA) 200-200-20 MG/5ML suspension 30 mL  30 mL Oral Q4H PRN Love, Pamela S, PA-C       apixaban (ELIQUIS) tablet 5 mg  5 mg Oral BID Love, Pamela S, PA-C   5 mg at 01/29/23 4098   baclofen (LIORESAL) tablet 5 mg  5 mg Oral TID PRN Jacquelynn Cree, PA-C   5 mg at 01/29/23 1129    bisacodyl (DULCOLAX) suppository 10 mg  10 mg Rectal Daily PRN Love, Pamela S, PA-C       cyclobenzaprine (FLEXERIL) tablet 10 mg  10 mg Oral TID Erick Colace, MD   10 mg at 01/29/23 0746   diltiazem (CARDIZEM CD) 24 hr capsule 180 mg  180 mg Oral Daily Jacquelynn Cree, PA-C   180 mg at 01/29/23 0746   diphenhydrAMINE (BENADRYL) capsule 25 mg  25 mg Oral Q6H PRN Love, Pamela S, PA-C       gabapentin (NEURONTIN) capsule 800 mg  800 mg Oral QID Raulkar, Drema Pry, MD   800 mg at 01/29/23 1129   guaiFENesin-dextromethorphan (ROBITUSSIN DM) 100-10 MG/5ML syrup 5-10 mL  5-10 mL Oral Q6H PRN Love, Pamela S, PA-C       HYDROmorphone (DILAUDID) tablet 4 mg  4 mg Oral Q4H PRN Raulkar, Drema Pry, MD   4 mg at 01/29/23 1257   insulin aspart (novoLOG) injection 10 Units  10 Units Subcutaneous BID Horton Chin, MD   10 Units at 01/29/23 1257   insulin glargine-yfgn (SEMGLEE) injection 40 Units  40 Units Subcutaneous BID Jacquelynn Cree, PA-C   40 Units at 01/29/23 0747   iron polysaccharides (NIFEREX) capsule 150 mg  150 mg Oral BID AC LoveEvlyn Kanner, PA-C   150 mg at 01/29/23 1129   lidocaine (LIDODERM)  5 % 1 patch  1 patch Transdermal Q24H Erick Colace, MD   1 patch at 01/27/23 0848   magnesium gluconate (MAGONATE) tablet 250 mg  250 mg Oral QHS Horton Chin, MD   250 mg at 01/28/23 2131   morphine (MS CONTIN) 12 hr tablet 15 mg  15 mg Oral Q12H Raulkar, Drema Pry, MD   15 mg at 01/29/23 0917   pantoprazole (PROTONIX) EC tablet 40 mg  40 mg Oral Daily Jacquelynn Cree, PA-C   40 mg at 01/29/23 0747   polyethylene glycol (MIRALAX / GLYCOLAX) packet 17 g  17 g Oral Daily Jacquelynn Cree, PA-C   17 g at 01/29/23 1610   prochlorperazine (COMPAZINE) tablet 5-10 mg  5-10 mg Oral Q6H PRN Delle Reining S, PA-C       Or   prochlorperazine (COMPAZINE) suppository 12.5 mg  12.5 mg Rectal Q6H PRN Love, Pamela S, PA-C       Or   prochlorperazine (COMPAZINE) injection 5-10 mg  5-10 mg Intravenous  Q6H PRN Love, Pamela S, PA-C       senna (SENOKOT) tablet 17.2 mg  2 tablet Oral QHS Raulkar, Drema Pry, MD       sodium phosphate (FLEET) enema 1 enema  1 enema Rectal Once PRN Love, Pamela S, PA-C       traMADol Janean Sark) tablet 50 mg  50 mg Oral Q6H PRN Jacquelynn Cree, PA-C   50 mg at 01/28/23 1534   traZODone (DESYREL) tablet 100 mg  100 mg Oral QHS Love, Pamela S, PA-C   100 mg at 01/28/23 2131   traZODone (DESYREL) tablet 25-50 mg  25-50 mg Oral QHS PRN Jacquelynn Cree, PA-C        Pt meds include: Statin :No Betablocker: No ASA: No Other anticoagulants/antiplatelets: Eliquis  Past Medical History:  Diagnosis Date   Abnormal uterine bleeding    Due to fibriods/required transfusions   Atrial fibrillation with RVR (HCC) 05/2022   underwent cardioversion by Dr. Mariah Milling   Closed fracture of coronoid process of right ulna 2011   Diabetes mellitus without complication (HCC)    Headache    Hypertension    Menorrhagia    OSA (obstructive sleep apnea)     Past Surgical History:  Procedure Laterality Date   AMPUTATION Right 01/17/2023   Procedure: AMPUTATION BELOW KNEE;  Surgeon: Iline Oven, MD;  Location: [facility] ORS;  Service: Vascular;  Laterality: Right;   CESAREAN SECTION     x2   LOWER EXTREMITY ANGIOGRAPHY Right 01/15/2023   Procedure: Lower Extremity Angiography;  Surgeon: Annice Needy, MD;  Location: [facility] INVASIVE CV LAB;  Service: Cardiovascular;  Laterality: Right;   none      Social History Social History   Tobacco Use   Smoking status: Every Day    Types: Cigarettes   Smokeless tobacco: Never  Substance Use Topics   Alcohol use: No   Drug use: No    Family History Family History  Adopted: Yes  Problem Relation Age of Onset   Diabetes Other    Hypertension Other     Allergies  Allergen Reactions   Demerol [Meperidine] Hives, Itching and Nausea And Vomiting   Meloxicam Nausea And Vomiting   Morphine Other (See Comments)    Other Reaction: tolerates  hydromorphone   Oxycodone Hives, Itching and Nausea And Vomiting   Toradol [Ketorolac Tromethamine] Hives, Itching and Nausea And Vomiting     REVIEW OF SYSTEMS  General: [ ]  Weight loss, [ ]  Fever, [ ]  chills Neurologic: [ ]  Dizziness, [ ]  Blackouts, [ ]  Seizure [ ]  Stroke, [ ]  "Mini stroke", [ ]  Slurred speech, [ ]  Temporary blindness; [ ]  weakness in arms or legs, [ ]  Hoarseness [ ]  Dysphagia Cardiac: [ ]  Chest pain/pressure, [ ]  Shortness of breath at rest [ ]  Shortness of breath with exertion, [ ]  Atrial fibrillation or irregular heartbeat  Vascular: [ ]  Pain in legs with walking, [x ] Pain in legs at rest, [ ]  Pain in legs at night,  [ ]  Non-healing ulcer, [ ]  Blood clot in vein/DVT,   Pulmonary: [ ]  Home oxygen, [ ]  Productive cough, [ ]  Coughing up blood, [ ]  Asthma,  [ ]  Wheezing [ ]  COPD Musculoskeletal:  [ ]  Arthritis, [ ]  Low back pain, [ ]  Joint pain Hematologic: [ ]  Easy Bruising, [ ]  Anemia; [ ]  Hepatitis Gastrointestinal: [ ]  Blood in stool, [ ]  Gastroesophageal Reflux/heartburn, Urinary: [ ]  chronic Kidney disease, [ ]  on HD - [ ]  MWF or [ ]  TTHS, [ ]  Burning with urination, [ ]  Difficulty urinating Skin: [ ]  Rashes, [ ]  Wounds Psychological: [ ]  Anxiety, [ ]  Depression  Physical Examination Vitals:   01/28/23 1615 01/28/23 1958 01/29/23 0451 01/29/23 1028  BP: 119/67 120/74 111/68 112/64  Pulse: 90 92 88 96  Resp: 18 18 17 16   Temp: 99 F (37.2 C) 98.9 F (37.2 C) 98.3 F (36.8 C) 98.5 F (36.9 C)  TempSrc: Oral Oral    SpO2: 97% 96% 92% 95%  Weight:      Height:       Body mass index is 29.18 kg/m.  General:  WDWN in NAD  HENT: WNL Eyes: Pupils equal Pulmonary: normal non-labored breathing , without Rales, rhonchi,  wheezing Cardiac: RRR, without  Murmurs, rubs or gallops; No carotid bruits Abdomen: soft, NT, no masses        Vascular Exam/Pulses:right BKA cool to touch distal stump is cool to touch.  There appears to be an old blister  posterior that has deflated.  .     Musculoskeletal: no muscle wasting or atrophy; no edema  Neurologic: A&O X 3; Appropriate Affect ;  SENSATION: normal; MOTOR FUNCTION: 5/5 Symmetric Speech is fluent/normal   Significant Diagnostic Studies: CBC Lab Results  Component Value Date   WBC 13.1 (H) 01/29/2023   HGB 8.7 (L) 01/29/2023   HCT 29.5 (L) 01/29/2023   MCV 73.4 (L) 01/29/2023   PLT 566 (H) 01/29/2023    BMET    Component Value Date/Time   NA 131 (L) 01/29/2023 0541   NA 135 (L) 06/01/2013 1805   K 5.2 (H) 01/29/2023 0541   K 3.6 06/01/2013 1805   CL 96 (L) 01/29/2023 0541   CL 103 06/01/2013 1805   CO2 25 01/29/2023 0541   CO2 25 06/01/2013 1805   GLUCOSE 99 01/29/2023 0541   GLUCOSE 179 (H) 06/01/2013 1805   BUN 23 (H) 01/29/2023 0541   BUN 11 06/01/2013 1805   CREATININE 1.84 (H) 01/29/2023 0541   CREATININE 0.79 06/01/2013 1805   CALCIUM 9.1 01/29/2023 0541   CALCIUM 9.1 06/01/2013 1805   GFRNONAA 34 (L) 01/29/2023 0541   GFRNONAA >60 06/01/2013 1805   GFRAA >60 10/08/2016 1820   GFRAA >60 06/01/2013 1805   Estimated Creatinine Clearance: 43.6 mL/min (A) (by C-G formula based on SCr of 1.84 mg/dL (H)).  COAG Lab Results  Component Value  Date   INR 1.0 01/15/2023   INR 1.0 05/26/2022   INR 1.3 (H) 05/23/2022       ASSESSMENT/PLAN:  Right LE embolic event without revascularization options and had right BKA.  She was doing well and the amputation appeared to be healing until 01/27/23.  Ischemic BKA with pain.   I will d/c Eliquis and start Heparin for Afib and embolic event.  She will need revision of the BKA verse AKA pending Dr. Estanislado Spire assessment.     Mosetta Pigeon 01/29/2023 1:31 PM   I agree with the above.  I have seen and evaluated the patient.  Briefly, this is a 42 year old female who was recently at [facility] for a ischemic right leg secondary to thromboembolic disease from atrial fibrillation.  She underwent percutaneous  arterial intervention however developed an ischemic leg requiring below-knee amputation.  She was transferred to inpatient rehab.  She is complaining of significant pain in her right below-knee amputation stump which has what looks like an old blister and ischemic changes to the bottom of her stump.  It is not completely clear if this is ischemia or just significant hematoma however she denies having any falls.  I suspect it is ischemia however she does not have an incision in the incision intact with staples.  I have recommended that we discontinue her Eliquis and start her on heparin in case she needs surgical revision.  It is unclear at this time if she needs surgical revision whether or not I could salvage a below-knee amputation.  More than likely she would need an above-knee amputation.  We will continue to monitor her for the next several days to see if her stump declares itself.  Durene Cal

## 2023-01-29 NOTE — Progress Notes (Signed)
Redness noted above the incision that is tender to touch, Purple area noted above incision line going down towards incision. Large blister noted below  to stump.  Notified Rinaldo Cloud love, [place]. PA made face to face. Vascular made face to face visit

## 2023-01-29 NOTE — Progress Notes (Signed)
PHARMACY - ANTICOAGULATION CONSULT NOTE  Pharmacy Consult for Eliquis >> IV heparin Indication:  limb ischemia, arterial occlusion, hx AFib  Allergies  Allergen Reactions   Demerol [Meperidine] Hives, Itching and Nausea And Vomiting   Meloxicam Nausea And Vomiting   Morphine Other (See Comments)    Other Reaction: tolerates hydromorphone   Oxycodone Hives, Itching and Nausea And Vomiting   Toradol [Ketorolac Tromethamine] Hives, Itching and Nausea And Vomiting    Patient Measurements: Height: 5\' 7"  (170.2 cm) Weight: 84.5 kg (186 lb 4.6 oz) IBW/kg (Calculated) : 61.6 Heparin Dosing Weight: 79.3 kg  Vital Signs: Temp: 98.5 F (36.9 C) (12/26 1028) BP: 112/64 (12/26 1028) Pulse Rate: 96 (12/26 1028)  Labs: Recent Labs    01/27/23 0510 01/28/23 0524 01/29/23 0541  HGB 7.0* 6.8* 8.7*  HCT 25.6* 24.3* 29.5*  PLT 389 427* 566*  CREATININE  --   --  1.84*    Estimated Creatinine Clearance: 43.6 mL/min (A) (by C-G formula based on SCr of 1.84 mg/dL (H)).   Medical History: Past Medical History:  Diagnosis Date   Abnormal uterine bleeding    Due to fibriods/required transfusions   Atrial fibrillation with RVR (HCC) 05/2022   underwent cardioversion by Dr. Mariah Milling   Closed fracture of coronoid process of right ulna 2011   Diabetes mellitus without complication (HCC)    Headache    Hypertension    Menorrhagia    OSA (obstructive sleep apnea)     Medications:  Scheduled:   cyclobenzaprine  10 mg Oral TID   diltiazem  180 mg Oral Daily   gabapentin  800 mg Oral QID   insulin aspart  10 Units Subcutaneous BID   insulin glargine-yfgn  40 Units Subcutaneous BID   iron polysaccharides  150 mg Oral BID AC   lidocaine  1 patch Transdermal Q24H   magnesium gluconate  250 mg Oral QHS   morphine  15 mg Oral Q12H   pantoprazole  40 mg Oral Daily   polyethylene glycol  17 g Oral Daily   senna  2 tablet Oral QHS   traZODone  100 mg Oral QHS    Assessment: 42 yo female  admitted with acute R lower limb ischemia and underwent lower extremity angiogram stent to popliteal artery, and thrombectomy on 12/12 with subsequent R BKA on 12/14. Patient was started on Eliquis 5mg  BID post-operatively for hx Afib. New ischemic skin changes noted on 12/26 - pharmacy consulted to switch Eliquis back to IV heparin in preparation for R BKA revision surgery.   Last dose of Eliquis given 12/26 @0747 . Hgb 8.7, platelets elevated.   Goal of Therapy:  Heparin level 0.3-0.7 units/ml aPTT 66-102 seconds Monitor platelets by anticoagulation protocol: Yes   Plan:  No bolus given recent Eliquis admin Start heparin drip 12 hours after last Eliquis dose Begin heparin infusion at 1250 units/hr at 8pm Check aPTT and heparin level 6 hours after drip started Daily aPTT, heparin level, CBC F/u VVS plans  Rexford Maus, PharmD, BCPS 01/29/2023 2:17 PM

## 2023-01-29 NOTE — Progress Notes (Signed)
PROGRESS NOTE   Subjective/Complaints: No new complaints this morning Continues to have severe pain. Asks if morphine can be added- long acting added Had BM yesterday  ROS: +pain in residual limb and phantom limb pain- improved, +urinary frequency- improved  Objective:   No results found.  Recent Labs    01/28/23 0524 01/29/23 0541  WBC 13.6* 13.1*  HGB 6.8* 8.7*  HCT 24.3* 29.5*  PLT 427* 566*   Recent Labs    01/29/23 0541  NA 131*  K 5.2*  CL 96*  CO2 25  GLUCOSE 99  BUN 23*  CREATININE 1.84*  CALCIUM 9.1    Intake/Output Summary (Last 24 hours) at 01/29/2023 0959 Last data filed at 01/29/2023 0700 Gross per 24 hour  Intake 1184 ml  Output --  Net 1184 ml        Physical Exam: Vital Signs Blood pressure 111/68, pulse 88, temperature 98.3 F (36.8 C), resp. rate 17, height 5\' 7"  (1.702 m), weight 84.5 kg, SpO2 92%.  In therapy in prone position  General: No acute distress, awake and alert Mood and affect are appropriate Ext no thigh swelling or ecchymosis Lungs: Clear to auscultation, breathing unlabored, no rales or wheezes  Skin: +blistering around incision site RIght BKA dressing just changed,pt did not want it unwrapped again this am, exam stable 12/26  Musculoskeletal: Right BKA  No joint swelling   Assessment/Plan: 1. Functional deficits which require 3+ hours per day of interdisciplinary therapy in a comprehensive inpatient rehab setting. Physiatrist is providing close team supervision and 24 hour management of active medical problems listed below. Physiatrist and rehab team continue to assess barriers to discharge/monitor patient progress toward functional and medical goals  Care Tool:  Bathing    Body parts bathed by patient: Right arm, Left arm, Chest, Abdomen, Front perineal area, Buttocks, Right upper leg, Left upper leg, Face   Body parts bathed by helper: Left lower  leg Body parts n/a: Right lower leg   Bathing assist Assist Level: Minimal Assistance - Patient > 75%     Upper Body Dressing/Undressing Upper body dressing   What is the patient wearing?: Pull over shirt    Upper body assist Assist Level: Independent    Lower Body Dressing/Undressing Lower body dressing      What is the patient wearing?: Pants, Underwear/pull up     Lower body assist Assist for lower body dressing: Minimal Assistance - Patient > 75%     Toileting Toileting    Toileting assist Assist for toileting: Contact Guard/Touching assist     Transfers Chair/bed transfer  Transfers assist     Chair/bed transfer assist level: Contact Guard/Touching assist     Locomotion Ambulation   Ambulation assist   Ambulation activity did not occur: Safety/medical concerns (pain)  Assist level: Contact Guard/Touching assist Assistive device: Walker-rolling Max distance: 20'   Walk 10 feet activity   Assist  Walk 10 feet activity did not occur: Safety/medical concerns (pain)  Assist level: Contact Guard/Touching assist Assistive device: Walker-rolling   Walk 50 feet activity   Assist Walk 50 feet with 2 turns activity did not occur: Safety/medical concerns (pain)  Walk 150 feet activity   Assist Walk 150 feet activity did not occur: Safety/medical concerns (pain)         Walk 10 feet on uneven surface  activity   Assist Walk 10 feet on uneven surfaces activity did not occur: Safety/medical concerns (pain)         Wheelchair     Assist Is the patient using a wheelchair?: Yes Type of Wheelchair: Manual    Wheelchair assist level: Independent Max wheelchair distance: 13ft    Wheelchair 50 feet with 2 turns activity    Assist        Assist Level: Independent   Wheelchair 150 feet activity     Assist      Assist Level: Independent   Blood pressure 111/68, pulse 88, temperature 98.3 F (36.8 C), resp.  rate 17, height 5\' 7"  (1.702 m), weight 84.5 kg, SpO2 92%.  Medical Problem List and Plan: 1. 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  Antithrombotics: -DVT/anticoagulation:  Pharmaceutical: Eliquis             -antiplatelet therapy: N/A 3. Pain Management:  increased dilaudid to 4mg  for severe pain as this is only medicine that helps and pain continues to be 10/10, hydrocodone d/ced since not helping. Long acting morphine added --On robaxin 1000 mg TID does not seen to help will use  change to  flexeril 10mg  TID-12/22--increase gabapentin to 800mg  TID Given GFR 48 will dose 800mg  q 12 h Has not taken hydrocodone q6h,3 times yesterday , discussed she may take another time per day DIscussed with OT, may benefit from TENS unit, has PT next  Add lidoderm patch right post knee  4. Mood/Behavior/Sleep:  LCSW to follow for evaluation and support.              -antipsychotic agents: N/A 5. Neuropsych/cognition: This patient is capable of making decisions on her own behalf. 6. Skin/Wound Care: Routine pressure relief measures.  7. Fluids/Electrolytes/Nutrition: Monitor I/O. Check CMET in am 8.  H/o A flutter/PAF: Monitor HR TID--continue Cardizem and Apixaban             --monitor for symptoms with increase in activity.  9. T1DM: Hgb A1c-9.2                         --Continue Insulin glargline bid--reports compliance at home.   D/c ISS, decreased CBG checks to AC/HS  Decrease novolog to 10U BID CBG (last 3)  Recent Labs    01/28/23 1616 01/28/23 2055 01/29/23 0627  GLUCAP 87 117* 113*  Controlled   10. Hyponatremia: Improved from 127 @ admission to 134             --Monitor for now 11. CKD?:  Per chart review SCr-1.56 on 08/24 and  1.33 @ admission.              --Monitor renal status with serial checks. Avoid nephrotoxic  medications.  12. Acute on chronic  iron deficiency anemia:  Has been transfused with 4 units PRBCs, latest 12/25 -iron d/ced due to constipation --Continue to monitor H&H for stability.    --hx  of menorrhagia/Fibroids-->on depo with good results PTA but has had recurrance w/clots past 3 days.  Hgb reviewed and improved    Latest Ref Rng & Units 01/29/2023    5:41 AM 01/28/2023    5:24 AM 01/27/2023    5:10 AM  CBC  WBC 4.0 - 10.5 K/uL 13.1  13.6  15.5   Hemoglobin 12.0 - 15.0 g/dL 8.7  6.8  C 7.0   Hematocrit 36.0 - 46.0 % 29.5  24.3  25.6   Platelets 150 - 400 K/uL 566  427  389     C Corrected result   Transfuse if <7.0- recheck in am , no dizziness with position changes, recheck in am    14. Obesity. BMI 34 on admission. Dietary counseling 15. Constipation. Miralax, senokot. Changed senna to 2 tabs HS, milk of magnesia ordered 12/25 16. Hyperkalemia. Monitor labs    Latest Ref Rng & Units 01/29/2023    5:41 AM 01/26/2023    5:15 AM 01/24/2023    5:22 AM  BMP  Glucose 70 - 99 mg/dL 99  638  90   BUN 6 - 20 mg/dL 23  18  14    Creatinine 0.44 - 1.00 mg/dL 7.56  4.33  2.95   Sodium 135 - 145 mmol/L 131  134  137   Potassium 3.5 - 5.1 mmol/L 5.2  5.1  4.5   Chloride 98 - 111 mmol/L 96  101  104   CO2 22 - 32 mmol/L 25  23  24    Calcium 8.9 - 10.3 mg/dL 9.1  9.2  8.9    Recheck in am   17. HTN. Lisinopril and hydrochlorothiazide have been stopped. BP stable, continue to hold, magnesium supplement started HS   Vitals:   01/28/23 1958 01/29/23 0451  BP: 120/74 111/68  Pulse: 92 88  Resp: 18 17  Temp: 98.9 F (37.2 C) 98.3 F (36.8 C)  SpO2: 96% 92%  Controlled   18. Blistering at incision site: Vibra scheduled  19. Leukocytosis: Vibra scheduled, reviewed and improved  LOS: 6 days A FACE TO FACE EVALUATION WAS PERFORMED  Clint Bolder P Elleanor Guyett 01/29/2023, 9:59 AM

## 2023-01-30 DIAGNOSIS — S88112S Complete traumatic amputation at level between knee and ankle, left lower leg, sequela: Secondary | ICD-10-CM | POA: Diagnosis not present

## 2023-01-30 DIAGNOSIS — T8789 Other complications of amputation stump: Secondary | ICD-10-CM | POA: Diagnosis not present

## 2023-01-30 LAB — CBC
HCT: 29.6 % — ABNORMAL LOW (ref 36.0–46.0)
Hemoglobin: 8.7 g/dL — ABNORMAL LOW (ref 12.0–15.0)
MCH: 21.8 pg — ABNORMAL LOW (ref 26.0–34.0)
MCHC: 29.4 g/dL — ABNORMAL LOW (ref 30.0–36.0)
MCV: 74 fL — ABNORMAL LOW (ref 80.0–100.0)
Platelets: 624 10*3/uL — ABNORMAL HIGH (ref 150–400)
RBC: 4 MIL/uL (ref 3.87–5.11)
RDW: 24.2 % — ABNORMAL HIGH (ref 11.5–15.5)
WBC: 11.9 10*3/uL — ABNORMAL HIGH (ref 4.0–10.5)
nRBC: 0 % (ref 0.0–0.2)

## 2023-01-30 LAB — APTT
aPTT: 47 s — ABNORMAL HIGH (ref 24–36)
aPTT: 50 s — ABNORMAL HIGH (ref 24–36)

## 2023-01-30 LAB — HEPARIN LEVEL (UNFRACTIONATED): Heparin Unfractionated: >1.1 [IU]/mL — ABNORMAL HIGH (ref 0.30–0.70)

## 2023-01-30 LAB — BASIC METABOLIC PANEL
Anion gap: 14 (ref 5–15)
BUN: 25 mg/dL — ABNORMAL HIGH (ref 6–20)
CO2: 22 mmol/L (ref 22–32)
Calcium: 9.6 mg/dL (ref 8.9–10.3)
Chloride: 96 mmol/L — ABNORMAL LOW (ref 98–111)
Creatinine, Ser: 1.74 mg/dL — ABNORMAL HIGH (ref 0.44–1.00)
GFR, Estimated: 37 mL/min — ABNORMAL LOW (ref >60)
Glucose, Bld: 53 mg/dL — ABNORMAL LOW (ref 70–99)
Potassium: 5.5 mmol/L — ABNORMAL HIGH (ref 3.5–5.1)
Sodium: 132 mmol/L — ABNORMAL LOW (ref 135–145)

## 2023-01-30 LAB — GLUCOSE, CAPILLARY
Glucose-Capillary: 87 mg/dL (ref 70–99)
Glucose-Capillary: 121 mg/dL — ABNORMAL HIGH (ref 70–99)
Glucose-Capillary: 86 mg/dL (ref 70–99)
Glucose-Capillary: 106 mg/dL — ABNORMAL HIGH (ref 70–99)
Glucose-Capillary: 141 mg/dL — ABNORMAL HIGH (ref 70–99)

## 2023-01-30 MED ORDER — HYDROMORPHONE HCL 2 MG PO TABS
2.0000 mg | ORAL_TABLET | ORAL | Status: DC | PRN
  Administered 2023-01-30 – 2023-02-05 (×25): 2 mg via ORAL
  Filled 2023-01-30 (×26): qty 1

## 2023-01-30 MED ORDER — GABAPENTIN 400 MG PO CAPS
400.0000 mg | ORAL_CAPSULE | Freq: Four times a day (QID) | ORAL | Status: DC
Start: 2023-01-30 — End: 2023-02-05
  Administered 2023-01-30 – 2023-02-05 (×24): 400 mg via ORAL
  Filled 2023-01-30 (×24): qty 1

## 2023-01-30 MED ORDER — SODIUM ZIRCONIUM CYCLOSILICATE 5 G PO PACK
5.0000 g | PACK | Freq: Once | ORAL | Status: AC
  Administered 2023-01-30: 5 g via ORAL
  Filled 2023-01-30: qty 1

## 2023-01-30 MED ORDER — INSULIN ASPART 100 UNIT/ML IJ SOLN
9.0000 [IU] | Freq: Two times a day (BID) | INTRAMUSCULAR | Status: DC
  Administered 2023-01-30 – 2023-02-02 (×4): 9 [IU] via SUBCUTANEOUS

## 2023-01-30 MED ORDER — HYDROCODONE-ACETAMINOPHEN 5-325 MG PO TABS
1.0000 | ORAL_TABLET | Freq: Three times a day (TID) | ORAL | Status: AC
  Administered 2023-01-30 – 2023-02-02 (×9): 1 via ORAL
  Filled 2023-01-30 (×9): qty 1

## 2023-01-30 NOTE — Progress Notes (Signed)
Occupational Therapy Session Note  Patient Details  Name: Jennifer [name] MRN: [id number] Date of Birth: 09/13/1980  Today's Date: 01/30/2023 OT Individual Time: 1350-1450 OT Individual Time Calculation (min): 60 min    Short Term Goals: Week 1:  OT Short Term Goal 1 (Week 1): STG = LTG due to ELOS  Skilled Therapeutic Interventions/Progress Updates:  Skilled OT intervention completed with focus on emotional support, wound healing/medical POC education, functional transfers, ADL retraining. Pt received seated EOB. 10/10 pain reported in Rt residual limb; pre-medicated. OT offered distraction, rest breaks and repositioning throughout for pain reduction.  Pt had series of questions about this therapist's perspective on her wound healing, her prognosis for keeping her leg and what would happen if chose not to have the revision surgery. OT suggested doffing of shrinker for inspection and discussion on areas of concern and also appropriate healing areas. Total A needed. Pt used self-inspection mirror and was able to locate without cues the dark, blistering region on base of her stump. Advised pt to follow up with MD for additional clarification, but we discussed ischemic changes, the darkened areas, and also compared those areas to surrounding healthy tissue regions. Of note- this therapist last saw the limb with only blisters present, and current status of wound looks a lot worse this date; medical team already aware. We discussed also, what happens with a revision surgery (BKA > AKA in most cases) and alternatively medically what could happen if she chose not to pursue. Pt expressed feelings of wanting to know her rights with medical decision making and we discussed palliative consult for determining medical POC to increase autonomy; MD notified.   With mod A, pt able to redonn 4X (up a size to reduce pain vs 3X) shrinker via donning tube. Pt noted to be slower with processing and slightly more  confused with sequencing/safety, but was able to complete sit > stands and stand pivots with CGA using bed rail/grab bar from EOB > w/c > BSC > w/c > EOB with cues for technique. Able to complete toileting steps including underwear/pad change, for continent urinary void and BM with min A. Washed UB supervision, dressed UB with min A for managing IV line with nursing present for threading, then min A for donning new pants at sit > stand level.   Pt remained upright in bed, with bed alarm on/activated, and with all needs in reach at end of session.   Therapy Documentation Precautions:  Precautions Precautions: Fall Restrictions Weight Bearing Restrictions Per Provider Order: Yes RLE Weight Bearing Per Provider Order: Non weight bearing    Therapy/Group: Individual Therapy  Melvyn Novas, MS, OTR/L  01/30/2023, 3:22 PM

## 2023-01-30 NOTE — Progress Notes (Signed)
Occupational Therapy Session Note  Patient Details  Name: Jennifer [name] MRN: [id number] Date of Birth: Dec 23, 1980  Today's Date: 01/30/2023 OT Individual Time: 1130-1200 OT Individual Time Calculation (min): 30 min    Short Term Goals: Week 1:  OT Short Term Goal 1 (Week 1): STG = LTG due to ELOS     Skilled Therapeutic Interventions/Progress Updates:    Pt received in bed sound asleep. She did waken with arm rub and was willing to do some therapy.  She wanted to wait to last session of day to do self care.  Agreeable to engaging in UE strengthening exercises.  Worked on upper back and tricep strength to help facilitate mobility skills with RW.  Pt sat to EOB.  Used 3 lb dowel bar and theraband for variations of exercises.  Pt having great difficulty keeping her eyes open and even following the directions.  She would be given an instruction and then do an opposite action for an exercise.  This seems to be only associated with the extreme lethargy.  She did perk up and became alert when talking about her son and wanted to show me pictures of him on her phone.   Pt's lunch was about to arrive.  She stated she was alert enough to continue sitting EOB.  Told if she felt herself nodding off again to lay down.  Tray table set up in front of pt.  Alarm on for bed and call light in reach.    Therapy Documentation Precautions:  Precautions Precautions: Fall Restrictions Weight Bearing Restrictions Per Provider Order: Yes RLE Weight Bearing Per Provider Order: Non weight bearing  Vital Signs: Therapy Vitals Temp: 99.2 F (37.3 C) Pulse Rate: 82 Resp: (!) 22 BP: 110/78 Patient Position (if appropriate): Lying Oxygen Therapy SpO2: 94 % O2 Device: Room Air Pain: Pain Assessment Pain Scale: 0-10 Pain Score: 10-Worst pain ever Pain Type: Surgical pain;Acute pain Pain Location: Knee Pain Orientation: Right Pain Descriptors / Indicators: Aching;Discomfort Pain Frequency: Constant Pain  Onset: On-going Patients Stated Pain Goal: 4 Pain Intervention(s): Medication (See eMAR) ADL: ADL Eating: Independent Where Assessed-Eating: Edge of bed Grooming: Independent Where Assessed-Grooming: Sitting at sink Upper Body Bathing: Independent Where Assessed-Upper Body Bathing: Sitting at sink Lower Body Bathing: Minimal assistance Where Assessed-Lower Body Bathing: Sitting at sink, Standing at sink Upper Body Dressing: Independent Where Assessed-Upper Body Dressing: Sitting at sink Lower Body Dressing: Minimal assistance Where Assessed-Lower Body Dressing: Sitting at sink, Standing at sink Toileting: Minimal assistance Where Assessed-Toileting: Teacher, adult education: Curator Method: Surveyor, minerals: Raised toilet seat, Grab bars Tub/Shower Transfer: Unable to assess Tub/Shower Transfer Method: Unable to assess [employer] Transfer: Unable to assess [employer] Method: Unable to assess   Therapy/Group: Individual Therapy  Pranavi Aure 01/30/2023, 9:12 AM

## 2023-01-30 NOTE — Progress Notes (Signed)
Patient ID: Jennifer [name], female   DOB: 10-21-1980, 42 y.o.   MRN: [id number]  SW received updates from [employer] reporting that insurance will only cover a TTB or shower chair. Informed to order TTB and pt will need to pay for shower chair.   Cecile Sheerer, MSW, LCSW Office: [phone] Cell: [phone] Fax: [phone]

## 2023-01-30 NOTE — Progress Notes (Addendum)
  Progress Note    01/30/2023 7:42 AM Hospital Day 7  Subjective:  resting in bed.  Says her  pain currently is about 9/10.  Wants to be able to salvage her stump.  Tm 99.2  VSS  Vitals:   01/30/23 0432 01/30/23 0615  BP: 99/72 110/78  Pulse: 85 82  Resp: 20 (!) 22  Temp: 99 F (37.2 C) 99.2 F (37.3 C)  SpO2: 99% 94%    Physical Exam: General:  resting in no acute distress Lungs:  non labored Extremities:  ischemic changes right BKA as pictured below      CBC    Component Value Date/Time   WBC 13.1 (H) 01/29/2023 0541   RBC 4.02 01/29/2023 0541   HGB 8.7 (L) 01/29/2023 0541   HGB 12.0 06/01/2013 1805   HCT 29.5 (L) 01/29/2023 0541   HCT 39.4 06/01/2013 1805   PLT 566 (H) 01/29/2023 0541   PLT 325 06/01/2013 1805   MCV 73.4 (L) 01/29/2023 0541   MCV 71 (L) 06/01/2013 1805   MCH 21.6 (L) 01/29/2023 0541   MCHC 29.5 (L) 01/29/2023 0541   RDW 23.7 (H) 01/29/2023 0541   RDW 18.1 (H) 06/01/2013 1805   LYMPHSABS 2.8 01/29/2023 0541   MONOABS 1.0 01/29/2023 0541   EOSABS 0.5 01/29/2023 0541   BASOSABS 0.1 01/29/2023 0541    BMET    Component Value Date/Time   NA 131 (L) 01/29/2023 0541   NA 135 (L) 06/01/2013 1805   K 5.2 (H) 01/29/2023 0541   K 3.6 06/01/2013 1805   CL 96 (L) 01/29/2023 0541   CL 103 06/01/2013 1805   CO2 25 01/29/2023 0541   CO2 25 06/01/2013 1805   GLUCOSE 99 01/29/2023 0541   GLUCOSE 179 (H) 06/01/2013 1805   BUN 23 (H) 01/29/2023 0541   BUN 11 06/01/2013 1805   CREATININE 1.84 (H) 01/29/2023 0541   CREATININE 0.79 06/01/2013 1805   CALCIUM 9.1 01/29/2023 0541   CALCIUM 9.1 06/01/2013 1805   GFRNONAA 34 (L) 01/29/2023 0541   GFRNONAA >60 06/01/2013 1805   GFRAA >60 10/08/2016 1820   GFRAA >60 06/01/2013 1805    INR    Component Value Date/Time   INR 1.0 01/15/2023 0925     Intake/Output Summary (Last 24 hours) at 01/30/2023 0742 Last data filed at 01/30/2023 0655 Gross per 24 hour  Intake 600.53 ml  Output --   Net 600.53 ml     Assessment/Plan:  42 y.o. female who is s/p right BKA on 01/17/2023 by Dr. Wallace Cullens at [facility] now with ischemic changes  Hospital Day 7  -pt continues to have pain right BKA.  She was transitioned from Eliquis to heparin yesterday afternoon.  Discussed with pt that it will need some time to demarcate and determine what can be done.  She expressed understanding.     Doreatha Massed, PA-C [facility] [phone] 01/30/2023 7:42 AM   I agree with the above.  The plan is to continue to monitor her below-knee amputation.  Her Eliquis has been stopped and she is on IV heparin.  More than likely she will require a more proximal amputation, however we will give her the weekend to let this demarcate and declare itself.  Durene Cal

## 2023-01-30 NOTE — Progress Notes (Signed)
Orthopedic Tech Progress Note Patient Details:  Jennifer [name] [birth year] [id number] Called in order to [employer] for 5XL shrinkers Patient ID: Jennifer [name], female   DOB: [birth year], 42 y.o.   MRN: [id number]  [name] 01/30/2023, 10:01 AM

## 2023-01-30 NOTE — Progress Notes (Signed)
Throughout the evening hours continued to report pain and discomfort with right BKA. Repositioned several times with staff assist. Positioned in the reclining chair as she reports intermittently letting her right leg suspend to the side of the bed has aided in providing some relief with the pain. Given PRN medication for pain and sleep. Toileted several times and ADLS attended to throughout the evening. Given snacks and drinks to maintain patient's glucose levels within therapeutic range. CBG was obtained in the early morning hours due to patient appearing lethargic, skin was warm & dry to the touch, and diaphoretic. Also, reporting dryness with her mouth. CBG obtained and within therapeutic levels at this time.

## 2023-01-30 NOTE — Progress Notes (Addendum)
PHARMACY - ANTICOAGULATION CONSULT NOTE  Pharmacy Consult for Eliquis >> IV heparin Indication:  limb ischemia, arterial occlusion, hx AFib  Allergies  Allergen Reactions   Demerol [Meperidine] Hives, Itching and Nausea And Vomiting   Meloxicam Nausea And Vomiting   Morphine Other (See Comments)    Other Reaction: tolerates hydromorphone   Oxycodone Hives, Itching and Nausea And Vomiting   Toradol [Ketorolac Tromethamine] Hives, Itching and Nausea And Vomiting    Patient Measurements: Height: 5\' 7"  (170.2 cm) Weight: 84.5 kg (186 lb 4.6 oz) IBW/kg (Calculated) : 61.6 Heparin Dosing Weight: 79.3 kg  Vital Signs: Temp: 99.2 F (37.3 C) (12/27 0615) BP: 110/78 (12/27 0615) Pulse Rate: 82 (12/27 0615)  Labs: Recent Labs    01/28/23 0524 01/29/23 0541 01/30/23 0733  HGB 6.8* 8.7* 8.7*  HCT 24.3* 29.5* 29.6*  PLT 427* 566* 624*  CREATININE  --  1.84*  --     Estimated Creatinine Clearance: 43.6 mL/min (A) (by C-G formula based on SCr of 1.84 mg/dL (H)).   Medical History: Past Medical History:  Diagnosis Date   Abnormal uterine bleeding    Due to fibriods/required transfusions   Atrial fibrillation with RVR (HCC) 05/2022   underwent cardioversion by Dr. Mariah Milling   Closed fracture of coronoid process of right ulna 2011   Diabetes mellitus without complication (HCC)    Headache    Hypertension    Menorrhagia    OSA (obstructive sleep apnea)     Medications:  Scheduled:   cyclobenzaprine  10 mg Oral TID   diltiazem  180 mg Oral Daily   gabapentin  800 mg Oral QID   insulin aspart  9 Units Subcutaneous BID WC   insulin glargine-yfgn  40 Units Subcutaneous BID   iron polysaccharides  150 mg Oral BID AC   lidocaine  1 patch Transdermal Q24H   magnesium gluconate  250 mg Oral QHS   morphine  15 mg Oral Q12H   pantoprazole  40 mg Oral Daily   polyethylene glycol  17 g Oral Daily   senna  2 tablet Oral QHS   traZODone  100 mg Oral QHS    Assessment: 42 yo  female admitted with acute R lower limb ischemia and underwent lower extremity angiogram stent to popliteal artery, and thrombectomy on 12/12 with subsequent R BKA on 12/14. Patient was started on Eliquis 5mg  BID post-operatively for hx Afib. New ischemic skin changes noted on 12/26 - pharmacy consulted to switch Eliquis back to IV heparin in preparation for R BKA revision surgery vs AKA.   Heparin level >1.1 which is falsely elevated due to recent Eliquis admin. aPTT subtherapeutic at 47 seconds. Hgb 8.7 - stable, platelets elevated. No issues w/ heparin infusing or bleeding noted.  Goal of Therapy:  Heparin level 0.3-0.7 units/ml aPTT 66-102 seconds Monitor platelets by anticoagulation protocol: Yes   Plan:  Increase heparin infusion to 1500 units/hr Check aPTT in 6 hours Daily aPTT, heparin level, CBC F/u VVS plans and ability to transition back to Eliquis  Rexford Maus, PharmD, BCPS 01/30/2023 9:13 AM

## 2023-01-30 NOTE — Progress Notes (Signed)
PHARMACY - ANTICOAGULATION CONSULT NOTE  Pharmacy Consult for Eliquis >> IV heparin Indication:  limb ischemia, arterial occlusion, hx AFib  Allergies  Allergen Reactions   Demerol [Meperidine] Hives, Itching and Nausea And Vomiting   Meloxicam Nausea And Vomiting   Morphine Other (See Comments)    Other Reaction: tolerates hydromorphone   Oxycodone Hives, Itching and Nausea And Vomiting   Toradol [Ketorolac Tromethamine] Hives, Itching and Nausea And Vomiting    Patient Measurements: Height: 5\' 7"  (170.2 cm) Weight: 84.5 kg (186 lb 4.6 oz) IBW/kg (Calculated) : 61.6 Heparin Dosing Weight: 79.3 kg  Vital Signs: Temp: 98.8 F (37.1 C) (12/27 1336) BP: 115/71 (12/27 1336) Pulse Rate: 92 (12/27 1336)  Labs: Recent Labs    01/28/23 0524 01/29/23 0541 01/30/23 0733 01/30/23 1239 01/30/23 1614  HGB 6.8* 8.7* 8.7*  --   --   HCT 24.3* 29.5* 29.6*  --   --   PLT 427* 566* 624*  --   --   APTT  --   --  47*  --  50*  HEPARINUNFRC  --   --  >1.10*  --   --   CREATININE  --  1.84*  --  1.74*  --     Estimated Creatinine Clearance: 46.1 mL/min (A) (by C-G formula based on SCr of 1.74 mg/dL (H)).   Medical History: Past Medical History:  Diagnosis Date   Abnormal uterine bleeding    Due to fibriods/required transfusions   Atrial fibrillation with RVR (HCC) 05/2022   underwent cardioversion by Dr. Mariah Milling   Closed fracture of coronoid process of right ulna 2011   Diabetes mellitus without complication (HCC)    Headache    Hypertension    Menorrhagia    OSA (obstructive sleep apnea)     Medications:  Scheduled:   cyclobenzaprine  10 mg Oral TID   diltiazem  180 mg Oral Daily   gabapentin  400 mg Oral QID   insulin aspart  9 Units Subcutaneous BID WC   insulin glargine-yfgn  40 Units Subcutaneous BID   iron polysaccharides  150 mg Oral BID AC   lidocaine  1 patch Transdermal Q24H   magnesium gluconate  250 mg Oral QHS   morphine  15 mg Oral Q12H   pantoprazole   40 mg Oral Daily   polyethylene glycol  17 g Oral Daily   senna  2 tablet Oral QHS   sodium zirconium cyclosilicate  5 g Oral Once   traZODone  100 mg Oral QHS    Assessment: 42 yo female admitted with acute R lower limb ischemia and underwent lower extremity angiogram stent to popliteal artery, and thrombectomy on 12/12 with subsequent R BKA on 12/14. Patient was started on Eliquis 5mg  BID post-operatively for hx Afib. New ischemic skin changes noted on 12/26 - pharmacy consulted to switch Eliquis back to IV heparin in preparation for R BKA revision surgery vs AKA.   Heparin level >1.1 which is falsely elevated due to recent Eliquis admin. aPTT subtherapeutic at 47 seconds. Hgb 8.7 - stable, platelets elevated. No issues w/ heparin infusing or bleeding noted.  PM update: aPTT 50 (subtherapeutic) - minimal increase after increasing heparin rate to 1500 units/hr. Confirmed with RN that heparin drip is running continuously at correct rate without pauses/interruptions/infiltrations. No bleeding reported.  Goal of Therapy:  Heparin level 0.3-0.7 units/ml aPTT 66-102 seconds Monitor platelets by anticoagulation protocol: Yes   Plan:  Increase heparin infusion to 1700 units/hr Check aPTT in  8 hours Daily aPTT, heparin level, CBC F/u VVS plans and ability to transition back to Eliquis  Loralee Pacas, PharmD, BCPS 01/30/2023 5:01 PM  Please check AMION for all [facility] Pharmacy phone numbers After 10:00 PM, call Main Pharmacy [phone]

## 2023-01-30 NOTE — Progress Notes (Signed)
Physical Therapy Session Note  Patient Details  Name: Jennifer [name] MRN: [id number] Date of Birth: 1981/01/20  Today's Date: 01/30/2023 PT Individual Time: [phone] and [phone] PT Individual Time Calculation (min): 53 min  and 71 min Today's Date: 01/30/2023 PT Missed Time: 7 Minutes Missed Time Reason: Other (Comment) (breakfast)  Short Term Goals: Week 1:  PT Short Term Goal 1 (Week 1): STG=LTG due to LOS  Skilled Therapeutic Interventions/Progress Updates:   Treatment Session 1 Received pt sitting EOB with RN at bedside. Pt agreeable to PT treatment and reported pain 10/10 in R residual limb - RN alerted and present to administer pain medication. Session with emphasis on functional mobility/transfers, pain management, generalized strengthening and endurance, and ROM and contracture prevention. Pt donned shrinker with supervision using shrinker donning tube and transferred into WC via stand<>pivot without AD and close supervision.   Pt performed WC mobility 120ft x 2 trials using BUE and mod I to/from main therapy gym with emphasis on UE strength. Pt politely declined practicing stair navigation due to pain and reported feeling "disgusted" with thought of possibly having to have revision with R AKA - provided emotional support and encouragement. Pt transferred on/off mat via stand<>pivot without AD and CGA/close supervision. Transitioned into prone with supervision and performed the following exercises with emphasis on contracture prevention, hip flexor stretching: -prone hamstring curls with 2.5lb ankle weight on LLE 2x15 and unweighted on R residual limb 2x10; visible shaking noted in R gastroc and provided cues for deep breathing and mental imagery -prone hip extension 2x12 on LLE and 2x10 on R residual limb -sidelying R hip abduction 2x10 -sidelying R hip extension 2x10 Pt transferred L sidelying<>sitting EOM with supervision and returned to [facility]. Returned to room and requested to  return to bed. Stand<>pivot WC<>bed with bedrail and supervision (cues to place WC closer to bed for safety). Breakfast tray arrived and pt requested time to eat. Pt left sitting EOB, needs within reach, and bed alarm on. 7 minutes missed of skilled physical therapy due to eating breakfast.   Treatment Session 2 Received pt sitting EOB, pt agreeable to PT treatment, and reported pain 10+/10 in R residual limb and feeling "drowsy" - pt falling in and out of sleep throughout session and required more safety cues than normal with mobility - treatment team made aware. Session with emphasis on functional mobility/transfers, pain management, generalized strengthening and endurance, dynamic standing balance/coordination, and gait training. Donned 3XL shrinker with min A (assist to place shrinker on donning tube and to remove tube while pt pulled shrinker over residual limb).   Pt transferred into WC via stand<>pivot without AD and close supervision. Donned limb guard with min A but pt unable to tolerate wearing black strap over knee to promote extension. Pt performed WC mobility 68ft using BUE and mod I to ortho gym. Pt performed simulated car transfer with min A using RW with cues for RW safety and to back up completely to [facility] prior to sitting. Transported to dayroom, stood with RW and CGA, and ambulated 73ft with RW and CGA/min A to Nustep. Pt performed seated BUE and LLE strengthening on Nustep at workload 4 for 8 minutes for a total of 193 steps with emphasis on cardiovascular endurance. Pt falling in/out of sleep on Nustep and placed R residual limb on footplate after therapist emphasized where to place it prior to starting exercise - demonstrating significantly decreased awareness this morning.   Performed AMPnoPro with score 14/39 indicative of K1-K2 classification  level. Again pt falling in/out of sleep during assessment. Transported back to room in [facility] dependently and pt reported urge to toilet. Transferred  WC<>toilet with bedside commode over top using grab bars with CGA. Pt required assist to hold dress but able to manage underwear without assist. Pt handed off to RN due to time restrictions.   Therapy Documentation Precautions:  Precautions Precautions: Fall Restrictions Weight Bearing Restrictions Per Provider Order: Yes RLE Weight Bearing Per Provider Order: Non weight bearing  Therapy/Group: Individual Therapy Marlana Salvage Zaunegger Blima Rich PT, DPT 01/30/2023, 6:49 AM

## 2023-01-30 NOTE — Progress Notes (Addendum)
PROGRESS NOTE   Subjective/Complaints: Extremely drowsy this morning and she thinks this is in regards to pain medication- dialudid decreased to 2mg  and gabapentin to 400mg   ROS: +pain in residual limb and phantom limb pain- improved, +urinary frequency- improved, +drowsiness 2/2 medication  Objective:   No results found.  Recent Labs    01/29/23 0541 01/30/23 0733  WBC 13.1* 11.9*  HGB 8.7* 8.7*  HCT 29.5* 29.6*  PLT 566* 624*   Recent Labs    01/29/23 0541  NA 131*  K 5.2*  CL 96*  CO2 25  GLUCOSE 99  BUN 23*  CREATININE 1.84*  CALCIUM 9.1    Intake/Output Summary (Last 24 hours) at 01/30/2023 1039 Last data filed at 01/30/2023 1000 Gross per 24 hour  Intake 837.53 ml  Output --  Net 837.53 ml        Physical Exam: Vital Signs Blood pressure 110/78, pulse 82, temperature 99.2 F (37.3 C), resp. rate (!) 22, height 5\' 7"  (1.702 m), weight 84.5 kg, SpO2 94%.  In therapy in prone position  General: No acute distress, awake and alert Mood and affect are appropriate Ext no thigh swelling or ecchymosis Lungs: Clear to auscultation, breathing unlabored, no rales or wheezes  Skin: +blistering around incision site, +ischemic changes   Assessment/Plan: 1. Functional deficits which require 3+ hours per day of interdisciplinary therapy in a comprehensive inpatient rehab setting. Physiatrist is providing close team supervision and 24 hour management of active medical problems listed below. Physiatrist and rehab team continue to assess barriers to discharge/monitor patient progress toward functional and medical goals  Care Tool:  Bathing    Body parts bathed by patient: Right arm, Left arm, Chest, Abdomen, Front perineal area, Buttocks, Right upper leg, Left upper leg, Face   Body parts bathed by helper: Left lower leg Body parts n/a: Right lower leg   Bathing assist Assist Level: Minimal Assistance -  Patient > 75%     Upper Body Dressing/Undressing Upper body dressing   What is the patient wearing?: Pull over shirt    Upper body assist Assist Level: Independent    Lower Body Dressing/Undressing Lower body dressing      What is the patient wearing?: Pants, Underwear/pull up     Lower body assist Assist for lower body dressing: Minimal Assistance - Patient > 75%     Toileting Toileting    Toileting assist Assist for toileting: Contact Guard/Touching assist     Transfers Chair/bed transfer  Transfers assist     Chair/bed transfer assist level: Supervision/Verbal cueing     Locomotion Ambulation   Ambulation assist   Ambulation activity did not occur: Safety/medical concerns (pain)  Assist level: Contact Guard/Touching assist Assistive device: Walker-rolling Max distance: 20'   Walk 10 feet activity   Assist  Walk 10 feet activity did not occur: Safety/medical concerns (pain)  Assist level: Contact Guard/Touching assist Assistive device: Walker-rolling   Walk 50 feet activity   Assist Walk 50 feet with 2 turns activity did not occur: Safety/medical concerns (pain)         Walk 150 feet activity   Assist Walk 150 feet activity did not occur: Safety/medical  concerns (pain)         Walk 10 feet on uneven surface  activity   Assist Walk 10 feet on uneven surfaces activity did not occur: Safety/medical concerns (pain)         Wheelchair     Assist Is the patient using a wheelchair?: Yes Type of Wheelchair: Manual    Wheelchair assist level: Independent Max wheelchair distance: 145ft    Wheelchair 50 feet with 2 turns activity    Assist        Assist Level: Independent   Wheelchair 150 feet activity     Assist      Assist Level: Independent   Blood pressure 110/78, pulse 82, temperature 99.2 F (37.3 C), resp. rate (!) 22, height 5\' 7"  (1.702 m), weight 84.5 kg, SpO2 94%.  Medical Problem List and  Plan: 1. 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  Antithrombotics: switched from eliquis to heparin             -antiplatelet therapy: N/A 3. Pain Management: decrease dilaudid to 2mg  and gabapentin to 400mg  given drowsiness. Long acting morphine added --On robaxin 1000 mg TID does not seen to help will use  change to  flexeril 10mg  TID-12/22--increase gabapentin to 800mg  TID Given GFR 48 will dose 800mg  q 12 h Has not taken hydrocodone q6h,3 times yesterday , discussed she may take another time per day DIscussed with OT, may benefit from TENS unit, has PT next  Add lidoderm patch right post knee  4. Mood/Behavior/Sleep:  LCSW to follow for evaluation and support.              -antipsychotic agents: N/A 5. Neuropsych/cognition: This patient is capable of making decisions on her own behalf. 6. Skin/Wound Care: Routine pressure relief measures.  7. Fluids/Electrolytes/Nutrition: Monitor I/O. Check CMET in am 8.  H/o A flutter/PAF: Monitor HR TID--continue Cardizem and Apixaban             --monitor for symptoms with increase in activity.  9. T1DM: Hgb A1c-9.2                         --Continue Insulin glargline bid--reports compliance at home.   D/c ISS, decreased CBG checks to AC/HS  Decrease novolog to 10U BID CBG (last 3)  Recent Labs    01/29/23 2030 01/30/23 0230 01/30/23 0629  GLUCAP 106* 87 121*  Controlled   10. Hyponatremia: Improved from 127 @ admission to 134             --Monitor for now 11. CKD?:  Per chart review SCr-1.56 on 08/24 and  1.33 @ admission.              --Monitor renal status with serial checks. Avoid nephrotoxic medications. Encouraging oral hydration.   12. Acute on chronic  iron deficiency anemia:  Has been transfused with 4 units PRBCs, latest 12/25 -iron d/ced due to constipation --Continue to  monitor H&H for stability.    --hx of menorrhagia/Fibroids-->on depo with good results PTA but has had recurrance w/clots past 3 days.  Hgb reviewed and improved    Latest Ref  Rng & Units 01/30/2023    7:33 AM 01/29/2023    5:41 AM 01/28/2023    5:24 AM  CBC  WBC 4.0 - 10.5 K/uL 11.9  13.1  13.6   Hemoglobin 12.0 - 15.0 g/dL 8.7  8.7  6.8  C  Hematocrit 36.0 - 46.0 % 29.6  29.5  24.3   Platelets 150 - 400 K/uL 624  566  427     C Corrected result   Transfuse if <7.0- recheck in am , no dizziness with position changes, recheck in am    14. Obesity. BMI 34 on admission. Dietary counseling 15. Constipation. Miralax, senokot. Changed senna to 2 tabs HS, milk of magnesia ordered 12/25 16. Hyperkalemia. Monitor labs    Latest Ref Rng & Units 01/29/2023    5:41 AM 01/26/2023    5:15 AM 01/24/2023    5:22 AM  BMP  Glucose 70 - 99 mg/dL 99  161  90   BUN 6 - 20 mg/dL 23  18  14    Creatinine 0.44 - 1.00 mg/dL 0.96  0.45  4.09   Sodium 135 - 145 mmol/L 131  134  137   Potassium 3.5 - 5.1 mmol/L 5.2  5.1  4.5   Chloride 98 - 111 mmol/L 96  101  104   CO2 22 - 32 mmol/L 25  23  24    Calcium 8.9 - 10.3 mg/dL 9.1  9.2  8.9    Recheck in am   17. HTN. Lisinopril and hydrochlorothiazide have been stopped. BP stable, continue to hold, magnesium supplement started HS   Vitals:   01/30/23 0432 01/30/23 0615  BP: 99/72 110/78  Pulse: 85 82  Resp: 20 (!) 22  Temp: 99 F (37.2 C) 99.2 F (37.3 C)  SpO2: 99% 94%  Controlled   18. Blistering at incision site: Vibra scheduled  19. Leukocytosis: Vibra scheduled, reviewed and improved  20. 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 Active smoker: denies desire to smoke, nicotine patch d/ced  22. Hyperkalemia: Lokelma ordered 12/27, repeat BMP ordered for tomorrow.  LOS: 7 days A FACE TO FACE EVALUATION WAS PERFORMED  Drema Pry Kaydence Menard 01/30/2023, 10:39 AM

## 2023-01-31 DIAGNOSIS — Z9189 Other specified personal risk factors, not elsewhere classified: Secondary | ICD-10-CM | POA: Diagnosis not present

## 2023-01-31 DIAGNOSIS — E871 Hypo-osmolality and hyponatremia: Secondary | ICD-10-CM

## 2023-01-31 DIAGNOSIS — I10 Essential (primary) hypertension: Secondary | ICD-10-CM

## 2023-01-31 DIAGNOSIS — G546 Phantom limb syndrome with pain: Secondary | ICD-10-CM | POA: Diagnosis not present

## 2023-01-31 DIAGNOSIS — S88111A Complete traumatic amputation at level between knee and ankle, right lower leg, initial encounter: Secondary | ICD-10-CM | POA: Diagnosis not present

## 2023-01-31 LAB — BASIC METABOLIC PANEL
Anion gap: 10 (ref 5–15)
BUN: 23 mg/dL — ABNORMAL HIGH (ref 6–20)
CO2: 23 mmol/L (ref 22–32)
Calcium: 9.1 mg/dL (ref 8.9–10.3)
Chloride: 99 mmol/L (ref 98–111)
Creatinine, Ser: 1.78 mg/dL — ABNORMAL HIGH (ref 0.44–1.00)
GFR, Estimated: 36 mL/min — ABNORMAL LOW (ref >60)
Glucose, Bld: 128 mg/dL — ABNORMAL HIGH (ref 70–99)
Potassium: 4.8 mmol/L (ref 3.5–5.1)
Sodium: 132 mmol/L — ABNORMAL LOW (ref 135–145)

## 2023-01-31 LAB — CBC
HCT: 27.5 % — ABNORMAL LOW (ref 36.0–46.0)
Hemoglobin: 8.3 g/dL — ABNORMAL LOW (ref 12.0–15.0)
MCH: 22.3 pg — ABNORMAL LOW (ref 26.0–34.0)
MCHC: 30.2 g/dL (ref 30.0–36.0)
MCV: 73.9 fL — ABNORMAL LOW (ref 80.0–100.0)
Platelets: 661 10*3/uL — ABNORMAL HIGH (ref 150–400)
RBC: 3.72 MIL/uL — ABNORMAL LOW (ref 3.87–5.11)
RDW: 24 % — ABNORMAL HIGH (ref 11.5–15.5)
WBC: 12.3 10*3/uL — ABNORMAL HIGH (ref 4.0–10.5)
nRBC: 0 % (ref 0.0–0.2)

## 2023-01-31 LAB — GLUCOSE, CAPILLARY
Glucose-Capillary: 184 mg/dL — ABNORMAL HIGH (ref 70–99)
Glucose-Capillary: 141 mg/dL — ABNORMAL HIGH (ref 70–99)
Glucose-Capillary: 124 mg/dL — ABNORMAL HIGH (ref 70–99)
Glucose-Capillary: 131 mg/dL — ABNORMAL HIGH (ref 70–99)

## 2023-01-31 LAB — APTT
aPTT: 66 s — ABNORMAL HIGH (ref 24–36)
aPTT: 68 s — ABNORMAL HIGH (ref 24–36)

## 2023-01-31 LAB — HEPARIN LEVEL (UNFRACTIONATED): Heparin Unfractionated: >1.1 [IU]/mL — ABNORMAL HIGH (ref 0.30–0.70)

## 2023-01-31 NOTE — Progress Notes (Signed)
PROGRESS NOTE   Subjective/Complaints:  Pt slept poorly d/t pain last night, upset with how much swelling there is but she's not wearing her shrinker-- was told not to? Unclear by whom. Vascular saw her yesterday, awaiting demarcation of wound to see what can be done for salvage of the stump.  Pain overall manageable but she does have a lot of pressure. Trying to prop it up.  LBM yesterday per pt. Urinating fine. Denies any other complaints or concerns.   ROS: +pain in residual limb and phantom limb pain- improved, +urinary frequency- improved, +drowsiness 2/2 medication  Objective:   No results found.  Recent Labs    01/30/23 0733 01/31/23 0542  WBC 11.9* 12.3*  HGB 8.7* 8.3*  HCT 29.6* 27.5*  PLT 624* 661*   Recent Labs    01/30/23 1239 01/31/23 0546  NA 132* 132*  K 5.5* 4.8  CL 96* 99  CO2 22 23  GLUCOSE 53* 128*  BUN 25* 23*  CREATININE 1.74* 1.78*  CALCIUM 9.6 9.1    Intake/Output Summary (Last 24 hours) at 01/31/2023 0956 Last data filed at 01/30/2023 1800 Gross per 24 hour  Intake 717 ml  Output --  Net 717 ml        Physical Exam: Vital Signs Blood pressure 138/82, pulse 87, temperature 98.7 F (37.1 C), temperature source Oral, resp. rate 18, height 5\' 7"  (1.702 m), weight 84.5 kg, SpO2 96%.   General: No acute distress, awake and alert, sitting at EOB with legs dangling over edge.  Mood and affect are appropriate, a little irritable.  CV: RRR no m/r/g appreciated Pulm: CTAB no w/r/r appreciated Abd: soft, NTND, +BS throughout Extremities: R BKA with swelling, bruising. No edema to LLE Skin: +blistering around incision site, +ischemic changes   Assessment/Plan: 1. Functional deficits which require 3+ hours per day of interdisciplinary therapy in a comprehensive inpatient rehab setting. Physiatrist is providing close team supervision and 24 hour management of active medical problems  listed below. Physiatrist and rehab team continue to assess barriers to discharge/monitor patient progress toward functional and medical goals  Care Tool:  Bathing    Body parts bathed by patient: Right arm, Left arm, Chest, Abdomen, Front perineal area, Buttocks, Right upper leg, Left upper leg, Face   Body parts bathed by helper: Left lower leg Body parts n/a: Right lower leg   Bathing assist Assist Level: Minimal Assistance - Patient > 75%     Upper Body Dressing/Undressing Upper body dressing   What is the patient wearing?: Pull over shirt    Upper body assist Assist Level: Independent    Lower Body Dressing/Undressing Lower body dressing      What is the patient wearing?: Pants, Underwear/pull up     Lower body assist Assist for lower body dressing: Minimal Assistance - Patient > 75%     Toileting Toileting    Toileting assist Assist for toileting: Contact Guard/Touching assist     Transfers Chair/bed transfer  Transfers assist     Chair/bed transfer assist level: Supervision/Verbal cueing     Locomotion Ambulation   Ambulation assist   Ambulation activity did not occur: Safety/medical concerns (pain)  Assist  level: Contact Guard/Touching assist Assistive device: Walker-rolling Max distance: 20'   Walk 10 feet activity   Assist  Walk 10 feet activity did not occur: Safety/medical concerns (pain)  Assist level: Contact Guard/Touching assist Assistive device: Walker-rolling   Walk 50 feet activity   Assist Walk 50 feet with 2 turns activity did not occur: Safety/medical concerns (pain)         Walk 150 feet activity   Assist Walk 150 feet activity did not occur: Safety/medical concerns (pain)         Walk 10 feet on uneven surface  activity   Assist Walk 10 feet on uneven surfaces activity did not occur: Safety/medical concerns (pain)         Wheelchair     Assist Is the patient using a wheelchair?: Yes Type of  Wheelchair: Manual    Wheelchair assist level: Independent Max wheelchair distance: 159ft    Wheelchair 50 feet with 2 turns activity    Assist        Assist Level: Independent   Wheelchair 150 feet activity     Assist      Assist Level: Independent   Blood pressure 138/82, pulse 87, temperature 98.7 F (37.1 C), temperature source Oral, resp. rate 18, height 5\' 7"  (1.702 m), weight 84.5 kg, SpO2 96%.  Medical Problem List and Plan: 1. 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  Antithrombotics: switched from eliquis to heparin             -antiplatelet therapy: N/A 3. Pain Management: decrease dilaudid to 2mg  and gabapentin to 400mg  given drowsiness. Long acting morphine added --On robaxin 1000 mg TID does not seen to help will use  change to  flexeril 10mg  TID-12/22--increase gabapentin to 800mg  TID Given GFR 48 will dose 800mg  q 12 h Has not taken hydrocodone q6h,3 times yesterday , discussed she may take another time per day DIscussed with OT, may benefit from TENS unit, has PT next  Add lidoderm patch right post knee -01/31/23 pain manageable overall, cont regimen, but advised pt to elevate the stump to help with swelling; unclear if she should be wearing her shrinker?  4. Mood/Behavior/Sleep:  LCSW to follow for evaluation and support.              -antipsychotic agents: N/A 5. Neuropsych/cognition: This patient is capable of making decisions on her own behalf. 6. Skin/Wound Care: Routine pressure relief measures.  7. Fluids/Electrolytes/Nutrition: Monitor I/O. Monitor labs  -01/31/23 BMP fairly stable, hyponatremia as below, Cr stable 1.78 8.  H/o A flutter/PAF: Monitor HR TID--continue Cardizem and Apixaban             --monitor for symptoms with increase in activity.  9. T1DM: Hgb A1c-9.2                          --Continue Insulin glargline bid--reports compliance at home.   D/c ISS, decreased CBG checks to AC/HS  Decrease novolog to 10U BID  Controlled   -01/31/23 CBGs looking good, monitor CBG (last 3)  Recent Labs    01/30/23 1617 01/30/23 2111 01/31/23 0981  GLUCAP 106* 141* 184*    10. Hyponatremia: Improved from 127 @ admission to 134             --Monitor for now  -01/31/23 up to 132, stable, cont monitoring.  11. CKD?:  Per chart review SCr-1.56 on 08/24 and  1.33 @ admission.              --Monitor renal status with serial checks. Avoid nephrotoxic medications. Encouraging oral hydration.   -01/31/23 Cr 1.78 but stable from yesterday, monitor 12. Acute on chronic  iron deficiency anemia:  Has been transfused with 4 units PRBCs, latest 12/25 -iron d/ced due to constipation --Continue to monitor H&H for stability.    --hx of menorrhagia/Fibroids-->on depo with good results PTA but has had recurrance w/clots past 3 days.  Hgb reviewed and improved  Transfuse if <7.0- recheck in am , no dizziness with position changes, recheck in am   -01/31/23 Hgb 8.3 today, stable, monitor    Latest Ref Rng & Units 01/31/2023    5:42 AM 01/30/2023    7:33 AM 01/29/2023    5:41 AM  CBC  WBC 4.0 - 10.5 K/uL 12.3  11.9  13.1   Hemoglobin 12.0 - 15.0 g/dL 8.3  8.7  8.7   Hematocrit 36.0 - 46.0 % 27.5  29.6  29.5   Platelets 150 - 400 K/uL 661  624  566     14. Obesity. BMI 34 on admission. Dietary counseling 15. Constipation. Miralax, senokot. Changed senna to 2 tabs HS, milk of magnesia ordered 12/25  -01/31/23 LBM yesterday per pt, not documented; monitor for now 16. Hyperkalemia. Monitor labs, lokelma ordered 12/27, repeat labs in AM  -01/31/23 K 4.8 today, monitor with routine labs    Latest Ref Rng & Units 01/31/2023    5:46 AM 01/30/2023   12:39 PM 01/29/2023    5:41 AM  BMP  Glucose 70 - 99 mg/dL 469  53  99   BUN 6 - 20 mg/dL 23  25  23    Creatinine  0.44 - 1.00 mg/dL 6.29  5.28  4.13   Sodium 135 - 145 mmol/L 132  132  131   Potassium 3.5 - 5.1 mmol/L 4.8  5.5  5.2   Chloride 98 - 111 mmol/L 99  96  96   CO2 22 - 32 mmol/L 23  22  25    Calcium 8.9 - 10.3 mg/dL 9.1  9.6  9.1      17. HTN. Lisinopril and hydrochlorothiazide have been stopped. BP stable, continue to hold, magnesium supplement started HS  -01/31/23 controlled   Vitals:   01/28/23 1958 01/29/23 0451 01/29/23 1028 01/29/23 1420  BP: 120/74 111/68 112/64 (!) 115/91   01/29/23 1850 01/29/23 2123 01/30/23 0226 01/30/23 0432  BP: 113/70 125/82 117/77 99/72   01/30/23 0615 01/30/23 1336 01/30/23 1934 01/31/23 0452  BP: 110/78 115/71 120/75 138/82     18. Blistering at incision site: Vibra scheduled, d/c'd 12/25  19. Leukocytosis: Vibra scheduled, reviewed and improved, d/c'd 12/25  20. 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 Active smoker: denies desire to smoke, nicotine patch d/ced  I spent >79mins performing patient care related activities, including face to face time, documentation time, discussion of meds/pain/swelling with patient and nursing staff, and overall coordination of care.    LOS: 8 days A FACE TO FACE EVALUATION WAS PERFORMED  [address] 01/31/2023, 9:56 AM

## 2023-01-31 NOTE — Progress Notes (Signed)
Occupational Therapy Session Note  Patient Details  Name: Jennifer [name] MRN: [id number] Date of Birth: July 15, 1980  Today's Date: 01/31/2023 OT Individual Time: 0901-1000 1st Session; 1445-1455 2nd Session  OT Individual Time Calculation (min): 59 min, 70 min    Short Term Goals: Week 1:  OT Short Term Goal 1 (Week 1): STG = LTG due to ELOS  Skilled Therapeutic Interventions/Progress Updates:  Session 1:  Focus of am session on Family Education with husband Jennifer [name]. Pt requesting toileting initially therefore OT began education on safe mobility from bed height to mirror king bed at home. Use of gait belt emphasized throughout session to allow for increased support for caregiver. Husband active participant and hands on for transfer and ADL set up as pt relatively able to perform all self care seated with set up approaching mod I. Transfers to commode over toilet with assist to manage IV pole with S. Completed peri and buttocks hygiene mod I and LB dressing toilet level with mod I. S for standing for pants management and transfers. OT able to observe husband don shrinker using shrinker tube indep. Pt able to apply with mod I. Educated on need for skin inspection and use of mirror with + teach back. Sink side seated UB bathing, dressing and oral care with mod I. 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 OT demonstrated 3 frame mirror for bed use and brief carryover with plan for later session focus. Pt's husband able to teach back safety techniques from TTB demonstrated during previous session. Reinforced pain management strategies and provided support as pt continues with questions and fears re: need for another surgery/amputation with note appreciated from MD visit to allow for the weekend to determine plan and pressure in residual limb. OT provided support and strategies for pain management and positioning. Pt requested to remain up in  w/c as PT session next with all needs and safety measures in reach.   Pain: 8/10 R residual limb resting bed level with pillow support and wound exposed to air, meds already given for pain and mirror therapy video shown to pt and husband Jennifer [name]   Session 2:  Pt asleep upon OT arrival. Noted r residual limb and LE in external rotation with increased pressure right on blistered position of limb on pillow. OT trained pt on rolling toward L side and placed pillow under buttocks and back and then R residual limb could have reduced pressure with + results while OT retrieved mirror for pain management. Pt then participated in 30 min of mirror therapy with + report of some relief especially pressure to distal R residual limb. Pt then trained in breathing techniques while completing red med resistance tband for scap, sh and elbow 2 sets of 10 reps. Left pt bed level at end of session as per pt request. Bed exit set, needs and nurse call button in reach.   Pain: 8/10 pain in R residual limb with 30 min mirror therapy conducted with rolling to L side with decreased R outer blistered region pressure reduced and more air exposure with 7/10 reported at end of session   Therapy Documentation Precautions:  Precautions Precautions: Fall Restrictions Weight Bearing Restrictions Per Provider Order: Yes RLE Weight Bearing Per Provider Order: Non weight bearing   Therapy/Group: Individual Therapy  Vicenta Dunning 01/31/2023, 7:41 AM

## 2023-01-31 NOTE — Progress Notes (Signed)
Patient's pain difficult to control overnight.  She was able to sleep a little between cares but usually rated pain around 10 and non verbal indicators were constantly present when awake.  Heparin gtt continues.  Limb is dark/ruddy/leaking serosanguinous fluid at staple site and is extremely painful.

## 2023-01-31 NOTE — Progress Notes (Signed)
Physical Therapy Session Note  Patient Details  Name: Jennifer [name] MRN: [id number] Date of Birth: [birth year]  Today's Date: 01/31/2023 PT Individual Time: 1000-1055 PT Individual Time Calculation (min): 55 min   Short Term Goals: Week 1:  PT Short Term Goal 1 (Week 1): STG=LTG due to LOS  Skilled Therapeutic Interventions/Progress Updates:    Chart reviewed and pt agreeable to therapy. Pt received seated in WC with c/o pain in RLE at surgical site that was not quantified. Also of note, pt found with RLE in dependent position. Session focused on family education and amb to promote safe home access and maintenance of mobility during healing phase. Family reported that previous session focused on stair navigation, and family nor pt had questions about stair navigation. Pt initiated session with donning of limb guard with sup. Pt then completed 17ft WC propulsion with S. In therapy gym family educated on safe guarding during car transfers. Pt completed blocked practice of car transfer at approximate height of pt's vehicle seat. Pt able to complete with light CGA fading to S. Family able to demonstrate safe positioning and guarding for pt with S assist level. Pt then amb 59ft using CGA + RW and completed 139ft WC propulsion to return to room with S. In room, PT provided education on importance of elevation to reduce swelling and prevent R knee contracture. Pt then transferred to bed with sup. At end of session, pt was left seated EOB per pt preference with alarm engaged, nurse call bell and all needs in reach.     Therapy Documentation Precautions:  Precautions Precautions: Fall Restrictions Weight Bearing Restrictions Per Provider Order: Yes RLE Weight Bearing Per Provider Order: Non weight bearing General:      Therapy/Group: Individual Therapy  Dionne Milo, PT, DPT 01/31/2023, 12:08 PM

## 2023-01-31 NOTE — Progress Notes (Signed)
PHARMACY - ANTICOAGULATION CONSULT NOTE  Pharmacy Consult for Eliquis >> IV heparin Indication:  limb ischemia, arterial occlusion, hx AFib  Allergies  Allergen Reactions   Demerol [Meperidine] Hives, Itching and Nausea And Vomiting   Meloxicam Nausea And Vomiting   Morphine Other (See Comments)    Other Reaction: tolerates hydromorphone   Oxycodone Hives, Itching and Nausea And Vomiting   Toradol [Ketorolac Tromethamine] Hives, Itching and Nausea And Vomiting    Patient Measurements: Height: 5\' 7"  (170.2 cm) Weight: 84.5 kg (186 lb 4.6 oz) IBW/kg (Calculated) : 61.6 Heparin Dosing Weight: 79.3 kg  Vital Signs: Temp: 98.7 F (37.1 C) (12/28 0452) Temp Source: Oral (12/28 0452) BP: 138/82 (12/28 0452) Pulse Rate: 87 (12/28 0452)  Labs: Recent Labs    01/29/23 0541 01/30/23 0733 01/30/23 1239 01/30/23 1614 01/31/23 0542 01/31/23 0546  HGB 8.7* 8.7*  --   --  8.3*  --   HCT 29.5* 29.6*  --   --  27.5*  --   PLT 566* 624*  --   --  661*  --   APTT  --  47*  --  50* 66*  --   HEPARINUNFRC  --  >1.10*  --   --  >1.10*  --   CREATININE 1.84*  --  1.74*  --   --  1.78*    Estimated Creatinine Clearance: 45.1 mL/min (A) (by C-G formula based on SCr of 1.78 mg/dL (H)).  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 Patient was started on Eliquis 5mg  BID post-operatively for hx Afib. New ischemic skin changes noted on 12/26 - pharmacy consulted to switch Eliquis back to IV heparin in preparation for R BKA revision surgery vs AKA.   Heparin level >1.1 which is falsely elevated due to recent Eliquis admin. aPTT is therapeutic at 66 sec which is low end of goal range - will increase rate to keep aPTT above low end goal. Hx menorrhagia and fibroids on Depo-Provera and is having vaginal bleeding. No other bleeding noted per RN, Hgb 8.3 and  low stable, s/p 2 units RBC on 12/25, platelets are elevated.  Goal of Therapy:  Heparin level 0.3-0.7 units/ml aPTT 66-102 seconds Monitor platelets by anticoagulation protocol: Yes   Plan:  Increase heparin infusion to 1800 units/hr Check aPTT in 8 hours Daily aPTT, heparin level, CBC F/u VVS plans and ability to transition back to Eliquis  Loralee Pacas, PharmD, BCPS 01/31/2023 7:21 AM  Please check AMION for all [facility] Pharmacy phone numbers After 10:00 PM, call Main Pharmacy [phone]

## 2023-01-31 NOTE — Progress Notes (Addendum)
PHARMACY - ANTICOAGULATION CONSULT NOTE  Pharmacy Consult for Eliquis >> IV heparin Indication:  limb ischemia, arterial occlusion, hx AFib  Allergies  Allergen Reactions   Demerol [Meperidine] Hives, Itching and Nausea And Vomiting   Meloxicam Nausea And Vomiting   Morphine Other (See Comments)    Other Reaction: tolerates hydromorphone   Oxycodone Hives, Itching and Nausea And Vomiting   Toradol [Ketorolac Tromethamine] Hives, Itching and Nausea And Vomiting    Patient Measurements: Height: 5\' 7"  (170.2 cm) Weight: 84.5 kg (186 lb 4.6 oz) IBW/kg (Calculated) : 61.6 Heparin Dosing Weight: 79.3 kg  Vital Signs:    Labs: Recent Labs    01/29/23 0541 01/29/23 0541 01/30/23 0733 01/30/23 1239 01/30/23 1614 01/31/23 0542 01/31/23 0546 01/31/23 1640  HGB 8.7*  --  8.7*  --   --  8.3*  --   --   HCT 29.5*  --  29.6*  --   --  27.5*  --   --   PLT 566*  --  624*  --   --  661*  --   --   APTT  --    < > 47*  --  50* 66*  --  68*  HEPARINUNFRC  --   --  >1.10*  --   --  >1.10*  --   --   CREATININE 1.84*  --   --  1.74*  --   --  1.78*  --    < > = values in this interval not displayed.    Estimated Creatinine Clearance: 45.1 mL/min (A) (by C-G formula based on SCr of 1.78 mg/dL (H)).  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 Patient was started on Eliquis 5mg  BID post-operatively for hx Afib. New ischemic skin changes noted on 12/26 - pharmacy consulted to switch Eliquis back to IV heparin in preparation for R BKA revision surgery vs AKA.   APTT 68  is at low end of therapeutic on 1800 units/hr.  No issues with infusion or bleeding per RN.  Goal of Therapy:  Heparin level 0.3-0.7 units/ml aPTT 66-102 seconds Monitor platelets by anticoagulation protocol: Yes   Plan:  Increase heparin infusion to 1900 units/hr F/u aPTT  until correlates with heparin level  Daily aPTT, heparin level, CBC F/u VVS plans and ability to transition back to Eliquis  Alphia Moh, PharmD, BCPS, BCCP Clinical Pharmacist  Please check AMION for all [facility] Pharmacy phone numbers After 10:00 PM, call Main Pharmacy [phone]

## 2023-01-31 NOTE — Progress Notes (Addendum)
Patient is concerned for R lower ext surgery site.  There is a dry round purple area anteriorly, that looks like it wants to slough off.  The stump is soft, purple, and oozing serous fluid "a little" at the staples.  Pt states "it's very painful".  Will continue to monitor. MD aware as in progress note from yesterday.

## 2023-01-31 NOTE — Plan of Care (Signed)
  Problem: Consults Goal: RH LIMB LOSS PATIENT EDUCATION Description: Description: See Patient Education module for eduction specifics. Outcome: Progressing Goal: Skin Care Protocol Initiated - if Braden Score 18 or less Description: If consults are not indicated, leave blank or document N/A Outcome: Progressing Goal: Diabetes Guidelines if Diabetic/Glucose > 140 Description: If diabetic or lab glucose is > 140 mg/dl - Initiate Diabetes/Hyperglycemia Guidelines & Document Interventions  Outcome: Progressing   Problem: RH SKIN INTEGRITY Goal: RH STG SKIN FREE OF INFECTION/BREAKDOWN Description: Skin to remain free from breakdown and incision to heal with no signs of infection with mod I assist. Outcome: Progressing Goal: RH STG MAINTAIN SKIN INTEGRITY WITH ASSISTANCE Description: STG Maintain Skin Integrity With mod I Assistance. Outcome: Progressing Goal: RH STG ABLE TO PERFORM INCISION/WOUND CARE W/ASSISTANCE Description: STG Able To Perform Incision/Wound Care With mod I Assistance. Outcome: Progressing   Problem: RH SAFETY Goal: RH STG ADHERE TO SAFETY PRECAUTIONS W/ASSISTANCE/DEVICE Description: STG Adhere to Safety Precautions With mod I Assistance/Device. Outcome: Progressing Goal: RH STG DECREASED RISK OF FALL WITH ASSISTANCE Description: STG Decreased Risk of Fall With mod I Assistance. Outcome: Progressing   Problem: RH PAIN MANAGEMENT Goal: RH STG PAIN MANAGED AT OR BELOW PT'S PAIN GOAL Description: < 4 on a 0-10 pain scale Outcome: Progressing   Problem: RH KNOWLEDGE DEFICIT LIMB LOSS Goal: RH STG INCREASE KNOWLEDGE OF SELF CARE AFTER LIMB LOSS Description: Patient will demonstrate knowledge of residual limb care, diabetes management, safety awareness with educational handouts and materials provided by staff during rehabilitation admission. Outcome: Progressing   Problem: Education: Goal: Ability to describe self-care measures that may prevent or decrease  complications (Diabetes Survival Skills Education) will improve Outcome: Progressing Goal: Individualized Educational Video(s) Outcome: Progressing   Problem: Coping: Goal: Ability to adjust to condition or change in health will improve Outcome: Progressing   Problem: Fluid Volume: Goal: Ability to maintain a balanced intake and output will improve Outcome: Progressing   Problem: Health Behavior/Discharge Planning: Goal: Ability to identify and utilize available resources and services will improve Outcome: Progressing Goal: Ability to manage health-related needs will improve Outcome: Progressing   Problem: Metabolic: Goal: Ability to maintain appropriate glucose levels will improve Outcome: Progressing   Problem: Nutritional: Goal: Maintenance of adequate nutrition will improve Outcome: Progressing Goal: Progress toward achieving an optimal weight will improve Outcome: Progressing   Problem: Skin Integrity: Goal: Risk for impaired skin integrity will decrease Outcome: Progressing   Problem: Tissue Perfusion: Goal: Adequacy of tissue perfusion will improve Outcome: Progressing

## 2023-02-01 DIAGNOSIS — I10 Essential (primary) hypertension: Secondary | ICD-10-CM | POA: Diagnosis not present

## 2023-02-01 DIAGNOSIS — S88111A Complete traumatic amputation at level between knee and ankle, right lower leg, initial encounter: Secondary | ICD-10-CM | POA: Diagnosis not present

## 2023-02-01 DIAGNOSIS — D649 Anemia, unspecified: Secondary | ICD-10-CM | POA: Diagnosis not present

## 2023-02-01 DIAGNOSIS — T8789 Other complications of amputation stump: Secondary | ICD-10-CM | POA: Diagnosis not present

## 2023-02-01 LAB — CBC
HCT: 28.7 % — ABNORMAL LOW (ref 36.0–46.0)
Hemoglobin: 8.4 g/dL — ABNORMAL LOW (ref 12.0–15.0)
MCH: 21.7 pg — ABNORMAL LOW (ref 26.0–34.0)
MCHC: 29.3 g/dL — ABNORMAL LOW (ref 30.0–36.0)
MCV: 74.2 fL — ABNORMAL LOW (ref 80.0–100.0)
Platelets: 697 10*3/uL — ABNORMAL HIGH (ref 150–400)
RBC: 3.87 MIL/uL (ref 3.87–5.11)
RDW: 24.3 % — ABNORMAL HIGH (ref 11.5–15.5)
WBC: 11.1 10*3/uL — ABNORMAL HIGH (ref 4.0–10.5)
nRBC: 0 % (ref 0.0–0.2)

## 2023-02-01 LAB — APTT: aPTT: 73 s — ABNORMAL HIGH (ref 24–36)

## 2023-02-01 LAB — GLUCOSE, CAPILLARY
Glucose-Capillary: 125 mg/dL — ABNORMAL HIGH (ref 70–99)
Glucose-Capillary: 146 mg/dL — ABNORMAL HIGH (ref 70–99)
Glucose-Capillary: 128 mg/dL — ABNORMAL HIGH (ref 70–99)
Glucose-Capillary: 133 mg/dL — ABNORMAL HIGH (ref 70–99)

## 2023-02-01 LAB — HEPARIN LEVEL (UNFRACTIONATED): Heparin Unfractionated: >1.1 [IU]/mL — ABNORMAL HIGH (ref 0.30–0.70)

## 2023-02-01 NOTE — Progress Notes (Signed)
Patient is concerned about BKA healing and necrotic tissue and drainage. Patient again at 10/10 pain. Patient will speak with Dr tomorrow concerning discharge date and if it's appropriate.

## 2023-02-01 NOTE — Plan of Care (Signed)
°  Problem: Consults Goal: RH LIMB LOSS PATIENT EDUCATION Description: Description: See Patient Education module for eduction specifics. Outcome: Progressing Goal: Skin Care Protocol Initiated - if Braden Score 18 or less Description: If consults are not indicated, leave blank or document N/A Outcome: Progressing Goal: Diabetes Guidelines if Diabetic/Glucose > 140 Description: If diabetic or lab glucose is > 140 mg/dl - Initiate Diabetes/Hyperglycemia Guidelines & Document Interventions  Outcome: Progressing   Problem: RH SKIN INTEGRITY Goal: RH STG SKIN FREE OF INFECTION/BREAKDOWN Description: Skin to remain free from breakdown and incision to heal with no signs of infection with mod I assist. Outcome: Progressing Goal: RH STG MAINTAIN SKIN INTEGRITY WITH ASSISTANCE Description: STG Maintain Skin Integrity With mod I Assistance. Outcome: Progressing Goal: RH STG ABLE TO PERFORM INCISION/WOUND CARE W/ASSISTANCE Description: STG Able To Perform Incision/Wound Care With mod I Assistance. Outcome: Progressing   Problem: RH SAFETY Goal: RH STG ADHERE TO SAFETY PRECAUTIONS W/ASSISTANCE/DEVICE Description: STG Adhere to Safety Precautions With mod I Assistance/Device. Outcome: Progressing Goal: RH STG DECREASED RISK OF FALL WITH ASSISTANCE Description: STG Decreased Risk of Fall With mod I Assistance. Outcome: Progressing   Problem: RH KNOWLEDGE DEFICIT LIMB LOSS Goal: RH STG INCREASE KNOWLEDGE OF SELF CARE AFTER LIMB LOSS Description: Patient will demonstrate knowledge of residual limb care, diabetes management, safety awareness with educational handouts and materials provided by staff during rehabilitation admission. Outcome: Progressing   Problem: Education: Goal: Ability to describe self-care measures that may prevent or decrease complications (Diabetes Survival Skills Education) will improve Outcome: Progressing Goal: Individualized Educational Video(s) Outcome: Progressing    Problem: Coping: Goal: Ability to adjust to condition or change in health will improve Outcome: Progressing   Problem: Fluid Volume: Goal: Ability to maintain a balanced intake and output will improve Outcome: Progressing   Problem: Health Behavior/Discharge Planning: Goal: Ability to identify and utilize available resources and services will improve Outcome: Progressing Goal: Ability to manage health-related needs will improve Outcome: Progressing   Problem: Metabolic: Goal: Ability to maintain appropriate glucose levels will improve Outcome: Progressing   Problem: Nutritional: Goal: Maintenance of adequate nutrition will improve Outcome: Progressing Goal: Progress toward achieving an optimal weight will improve Outcome: Progressing   Problem: Skin Integrity: Goal: Risk for impaired skin integrity will decrease Outcome: Progressing   Problem: Tissue Perfusion: Goal: Adequacy of tissue perfusion will improve Outcome: Progressing

## 2023-02-01 NOTE — Progress Notes (Signed)
PHARMACY - ANTICOAGULATION CONSULT NOTE  Pharmacy Consult for Eliquis >> IV heparin Indication:  limb ischemia, arterial occlusion, hx AFib  Allergies  Allergen Reactions   Demerol [Meperidine] Hives, Itching and Nausea And Vomiting   Meloxicam Nausea And Vomiting   Morphine Other (See Comments)    Other Reaction: tolerates hydromorphone   Oxycodone Hives, Itching and Nausea And Vomiting   Toradol [Ketorolac Tromethamine] Hives, Itching and Nausea And Vomiting    Patient Measurements: Height: 5\' 7"  (170.2 cm) Weight: 84.5 kg (186 lb 4.6 oz) IBW/kg (Calculated) : 61.6 Heparin Dosing Weight: 79.3 kg  Vital Signs: Temp: 98.5 F (36.9 C) (12/28 1935) BP: 120/71 (12/28 1935) Pulse Rate: 93 (12/28 1935)  Labs: Recent Labs    01/30/23 0733 01/30/23 1239 01/30/23 1614 01/31/23 0542 01/31/23 0546 01/31/23 1640 02/01/23 0511  HGB 8.7*  --   --  8.3*  --   --  8.4*  HCT 29.6*  --   --  27.5*  --   --  28.7*  PLT 624*  --   --  661*  --   --  697*  APTT 47*  --    < > 66*  --  68* 73*  HEPARINUNFRC >1.10*  --   --  >1.10*  --   --  >1.10*  CREATININE  --  1.74*  --   --  1.78*  --   --    < > = values in this interval not displayed.    Estimated Creatinine Clearance: 45.1 mL/min (A) (by C-G formula based on SCr of 1.78 mg/dL (H)).  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 Patient was started on Eliquis 5mg  BID post-operatively for hx Afib. New ischemic skin changes noted on 12/26 - pharmacy consulted to switch Eliquis back to IV heparin in preparation for R BKA revision surgery vs AKA.   Heparin level >1.1 which is falsely elevated due to recent Eliquis admin. aPTT is therapeutic at 73 sec. Hx menorrhagia and fibroids on Depo-Provera and is having vaginal bleeding. No other bleeding noted, Hgb low stable 8s, s/p 2 units RBC on  12/25, platelets are elevated.  Goal of Therapy:  Heparin level 0.3-0.7 units/ml aPTT 66-102 seconds Monitor platelets by anticoagulation protocol: Yes   Plan:  Continue heparin infusion at 1900 units/hr Daily aPTT, heparin level, CBC F/u VVS plans and ability to transition back to Eliquis  Thank you for involving pharmacy in this patient's care.  Loura Back, PharmD, BCPS Clinical Pharmacist Clinical phone for 02/01/2023 is [phone] 02/01/2023 7:12 AM

## 2023-02-01 NOTE — Progress Notes (Signed)
PROGRESS NOTE   Subjective/Complaints:  Pt doing better today, slept better, pain fairly manageable today.  LBM yesterday. Urinating fine. Denies any other complaints or concerns. Of note, has hx of fibroids and menorrhagia, vaginal bleeding is slowing down a lot, and hasn't had more clots since yesterday.   ROS: +pain in residual limb and phantom limb pain- improved, +urinary frequency- improved, +drowsiness 2/2 medication  Objective:   No results found.  Recent Labs    01/31/23 0542 02/01/23 0511  WBC 12.3* 11.1*  HGB 8.3* 8.4*  HCT 27.5* 28.7*  PLT 661* 697*   Recent Labs    01/30/23 1239 01/31/23 0546  NA 132* 132*  K 5.5* 4.8  CL 96* 99  CO2 22 23  GLUCOSE 53* 128*  BUN 25* 23*  CREATININE 1.74* 1.78*  CALCIUM 9.6 9.1    Intake/Output Summary (Last 24 hours) at 02/01/2023 1048 Last data filed at 02/01/2023 0700 Gross per 24 hour  Intake 759.79 ml  Output --  Net 759.79 ml        Physical Exam: Vital Signs Blood pressure 120/71, pulse 93, temperature 98.5 F (36.9 C), resp. rate 18, height 5\' 7"  (1.702 m), weight 84.5 kg, SpO2 94%.   General: No acute distress, awake and alert, laying in bed.  Mood and affect are appropriate, a little irritable.  CV: RRR no m/r/g appreciated Pulm: CTAB no w/r/r appreciated Abd: soft, NTND, +BS throughout Extremities: R BKA with swelling, bruising. No edema to LLE Skin: +blistering around incision site, +ischemic changes   Assessment/Plan: 1. Functional deficits which require 3+ hours per day of interdisciplinary therapy in a comprehensive inpatient rehab setting. Physiatrist is providing close team supervision and 24 hour management of active medical problems listed below. Physiatrist and rehab team continue to assess barriers to discharge/monitor patient progress toward functional and medical goals  Care Tool:  Bathing    Body parts bathed by patient:  Right arm, Left arm, Chest, Abdomen, Front perineal area, Buttocks, Right upper leg, Left upper leg, Face   Body parts bathed by helper: Left lower leg Body parts n/a: Right lower leg   Bathing assist Assist Level: Minimal Assistance - Patient > 75%     Upper Body Dressing/Undressing Upper body dressing   What is the patient wearing?: Pull over shirt    Upper body assist Assist Level: Independent    Lower Body Dressing/Undressing Lower body dressing      What is the patient wearing?: Pants, Underwear/pull up     Lower body assist Assist for lower body dressing: Minimal Assistance - Patient > 75%     Toileting Toileting    Toileting assist Assist for toileting: Contact Guard/Touching assist     Transfers Chair/bed transfer  Transfers assist     Chair/bed transfer assist level: Supervision/Verbal cueing     Locomotion Ambulation   Ambulation assist   Ambulation activity did not occur: Safety/medical concerns (pain)  Assist level: Contact Guard/Touching assist Assistive device: Walker-rolling Max distance: 20'   Walk 10 feet activity   Assist  Walk 10 feet activity did not occur: Safety/medical concerns (pain)  Assist level: Contact Guard/Touching assist Assistive device: Walker-rolling  Walk 50 feet activity   Assist Walk 50 feet with 2 turns activity did not occur: Safety/medical concerns (pain)         Walk 150 feet activity   Assist Walk 150 feet activity did not occur: Safety/medical concerns (pain)         Walk 10 feet on uneven surface  activity   Assist Walk 10 feet on uneven surfaces activity did not occur: Safety/medical concerns (pain)         Wheelchair     Assist Is the patient using a wheelchair?: Yes Type of Wheelchair: Manual    Wheelchair assist level: Independent Max wheelchair distance: 142ft    Wheelchair 50 feet with 2 turns activity    Assist        Assist Level: Independent    Wheelchair 150 feet activity     Assist      Assist Level: Independent   Blood pressure 120/71, pulse 93, temperature 98.5 F (36.9 C), resp. rate 18, height 5\' 7"  (1.702 m), weight 84.5 kg, SpO2 94%.  Medical Problem List and Plan: 1. 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  Antithrombotics: switched from eliquis to heparin             -antiplatelet therapy: N/A 3. Pain Management: decrease dilaudid to 2mg  and gabapentin to 400mg  given drowsiness. Long acting morphine added --On robaxin 1000 mg TID does not seen to help will use  change to  flexeril 10mg  TID-12/22--increase gabapentin to 800mg  TID Given GFR 48 will dose 800mg  q 12 h Has not taken hydrocodone q6h,3 times yesterday , discussed she may take another time per day DIscussed with OT, may benefit from TENS unit, has PT next  Add lidoderm patch right post knee -01/31/23 pain manageable overall, cont regimen, but advised pt to elevate the stump to help with swelling; unclear if she should be wearing her shrinker?  4. Mood/Behavior/Sleep:  LCSW to follow for evaluation and support.              -antipsychotic agents: N/A 5. Neuropsych/cognition: This patient is capable of making decisions on her own behalf. 6. Skin/Wound Care: Routine pressure relief measures.  7. Fluids/Electrolytes/Nutrition: Monitor I/O. Monitor labs  -01/31/23 BMP fairly stable, hyponatremia as below, Cr stable 1.78 8.  H/o A flutter/PAF: Monitor HR TID--continue Cardizem and Apixaban             --monitor for symptoms with increase in activity.  9. T1DM: Hgb A1c-9.2                         --Continue Insulin glargline bid--reports compliance at home.   D/c ISS, decreased CBG checks to AC/HS  Decrease novolog to 10U BID  Controlled   -12/28-29/24 CBGs looking good,  monitor CBG (last 3)  Recent Labs    01/31/23 1632 01/31/23 2042 02/01/23 0628  GLUCAP 124* 131* 125*    10. Hyponatremia: Improved from 127 @ admission to 134             --Monitor for now  -01/31/23 up to 132, stable, cont monitoring.  11. CKD?:  Per  chart review SCr-1.56 on 08/24 and  1.33 @ admission.              --Monitor renal status with serial checks. Avoid nephrotoxic medications. Encouraging oral hydration.   -01/31/23 Cr 1.78 but stable from yesterday, monitor 12. Acute on chronic  iron deficiency anemia:  Has been transfused with 4 units PRBCs, latest 12/25 -iron d/ced due to constipation --Continue to monitor H&H for stability.    --hx of menorrhagia/Fibroids-->on depo with good results PTA but has had recurrance w/clots past 3 days.  Hgb reviewed and improved  Transfuse if <7.0- recheck in am , no dizziness with position changes, recheck in am   -01/31/23 Hgb 8.3 today, stable, monitor -02/01/23 Hgb 8.4 today, doing great; vaginal bleeding subsiding too    Latest Ref Rng & Units 02/01/2023    5:11 AM 01/31/2023    5:42 AM 01/30/2023    7:33 AM  CBC  WBC 4.0 - 10.5 K/uL 11.1  12.3  11.9   Hemoglobin 12.0 - 15.0 g/dL 8.4  8.3  8.7   Hematocrit 36.0 - 46.0 % 28.7  27.5  29.6   Platelets 150 - 400 K/uL 697  661  624     14. Obesity. BMI 34 on admission. Dietary counseling 15. Constipation. Miralax, senokot. Changed senna to 2 tabs HS, milk of magnesia ordered 12/25  -02/01/23 LBM yesterday; cont regimen 16. Hyperkalemia. Monitor labs, lokelma ordered 12/27, repeat labs in AM  -01/31/23 K 4.8 today, monitor with routine labs    Latest Ref Rng & Units 01/31/2023    5:46 AM 01/30/2023   12:39 PM 01/29/2023    5:41 AM  BMP  Glucose 70 - 99 mg/dL 629  53  99   BUN 6 - 20 mg/dL 23  25  23    Creatinine 0.44 - 1.00 mg/dL 5.28  4.13  2.44   Sodium 135 - 145 mmol/L 132  132  131   Potassium 3.5 - 5.1 mmol/L 4.8  5.5  5.2   Chloride 98 - 111 mmol/L 99  96  96    CO2 22 - 32 mmol/L 23  22  25    Calcium 8.9 - 10.3 mg/dL 9.1  9.6  9.1      17. HTN. Lisinopril and hydrochlorothiazide have been stopped. BP stable, continue to hold, magnesium supplement started HS  -12/28-29/24 controlled   Vitals:   01/29/23 0451 01/29/23 1028 01/29/23 1420 01/29/23 1850  BP: 111/68 112/64 (!) 115/91 113/70   01/29/23 2123 01/30/23 0226 01/30/23 0432 01/30/23 0615  BP: 125/82 117/77 99/72 110/78   01/30/23 1336 01/30/23 1934 01/31/23 0452 01/31/23 1935  BP: 115/71 120/75 138/82 120/71     18. Blistering at incision site: Vibra scheduled, d/c'd 12/25  19. Leukocytosis: Vibra scheduled, reviewed and improved, d/c'd 12/25  20. 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 Active smoker: denies desire to smoke, nicotine patch d/ced  22. Vaginal bleeding: hx of fibroids and menorrhagia, seems to be slowing as of 02/01/23; monitor H&H with heparin use.      LOS: 9 days A FACE TO FACE EVALUATION WAS PERFORMED  [address] 02/01/2023, 10:48 AM

## 2023-02-01 NOTE — Progress Notes (Addendum)
°  Progress Note    02/01/2023 8:59 AM * No surgery found *  Subjective:  sitting up on side of bed. Says pain remains 9-10/10 just from phantom pains. Says her incisional pain has improved   Vitals:   01/31/23 0452 01/31/23 1935  BP: 138/82 120/71  Pulse: 87 93  Resp:  18  Temp: 98.7 F (37.1 C) 98.5 F (36.9 C)  SpO2: 96% 94%   Physical Exam: Cardiac:  regular Lungs:  non labored Incisions:  right BKA as shown below. Staple line intact. Ischemic changes to the posterior flap and lateral anterior flap, dried bulla on posterior lateral flap. Posterior flap is very cool to the touch    Neurologic: alert and oriented  CBC    Component Value Date/Time   WBC 11.1 (H) 02/01/2023 0511   RBC 3.87 02/01/2023 0511   HGB 8.4 (L) 02/01/2023 0511   HGB 12.0 06/01/2013 1805   HCT 28.7 (L) 02/01/2023 0511   HCT 39.4 06/01/2013 1805   PLT 697 (H) 02/01/2023 0511   PLT 325 06/01/2013 1805   MCV 74.2 (L) 02/01/2023 0511   MCV 71 (L) 06/01/2013 1805   MCH 21.7 (L) 02/01/2023 0511   MCHC 29.3 (L) 02/01/2023 0511   RDW 24.3 (H) 02/01/2023 0511   RDW 18.1 (H) 06/01/2013 1805   LYMPHSABS 2.8 01/29/2023 0541   MONOABS 1.0 01/29/2023 0541   EOSABS 0.5 01/29/2023 0541   BASOSABS 0.1 01/29/2023 0541    BMET    Component Value Date/Time   NA 132 (L) 01/31/2023 0546   NA 135 (L) 06/01/2013 1805   K 4.8 01/31/2023 0546   K 3.6 06/01/2013 1805   CL 99 01/31/2023 0546   CL 103 06/01/2013 1805   CO2 23 01/31/2023 0546   CO2 25 06/01/2013 1805   GLUCOSE 128 (H) 01/31/2023 0546   GLUCOSE 179 (H) 06/01/2013 1805   BUN 23 (H) 01/31/2023 0546   BUN 11 06/01/2013 1805   CREATININE 1.78 (H) 01/31/2023 0546   CREATININE 0.79 06/01/2013 1805   CALCIUM 9.1 01/31/2023 0546   CALCIUM 9.1 06/01/2013 1805   GFRNONAA 36 (L) 01/31/2023 0546   GFRNONAA >60 06/01/2013 1805   GFRAA >60 10/08/2016 1820   GFRAA >60 06/01/2013 1805    INR    Component Value Date/Time   INR 1.0 01/15/2023  0925     Intake/Output Summary (Last 24 hours) at 02/01/2023 0859 Last data filed at 02/01/2023 0700 Gross per 24 hour  Intake 879.79 ml  Output --  Net 879.79 ml     Assessment/Plan:  42 y.o. female s/p right BKA on 12/14 now with ischemic changes to stump  Stump overall stable over past couple days Remains cool to touch She does not have a lot of space to revise her BKA She likely will require more proximal amputation but will continue to allow to demarcate as at her age would be ideal to try to salvage her BKA to give her best opportunity for ambulating with prosthesis  On heparin gtt Will continue to follow   Graceann Congress, PA-C [facility] [phone] 02/01/2023 8:59 AM  VASCULAR STAFF ADDENDUM: I have independently interviewed and examined the patient. I agree with the above.   Rande Brunt. Lenell Antu, MD [facility] Phone Number: [phone] 02/01/2023 12:00 PM

## 2023-02-02 DIAGNOSIS — D649 Anemia, unspecified: Secondary | ICD-10-CM | POA: Diagnosis not present

## 2023-02-02 DIAGNOSIS — M79604 Pain in right leg: Secondary | ICD-10-CM | POA: Diagnosis not present

## 2023-02-02 DIAGNOSIS — N1832 Chronic kidney disease, stage 3b: Secondary | ICD-10-CM

## 2023-02-02 DIAGNOSIS — S88112S Complete traumatic amputation at level between knee and ankle, left lower leg, sequela: Secondary | ICD-10-CM

## 2023-02-02 DIAGNOSIS — Z7189 Other specified counseling: Secondary | ICD-10-CM

## 2023-02-02 DIAGNOSIS — S88111A Complete traumatic amputation at level between knee and ankle, right lower leg, initial encounter: Secondary | ICD-10-CM | POA: Diagnosis not present

## 2023-02-02 DIAGNOSIS — Z515 Encounter for palliative care: Secondary | ICD-10-CM

## 2023-02-02 DIAGNOSIS — I10 Essential (primary) hypertension: Secondary | ICD-10-CM | POA: Diagnosis not present

## 2023-02-02 LAB — GLUCOSE, CAPILLARY
Glucose-Capillary: 106 mg/dL — ABNORMAL HIGH (ref 70–99)
Glucose-Capillary: 59 mg/dL — ABNORMAL LOW (ref 70–99)
Glucose-Capillary: 73 mg/dL (ref 70–99)
Glucose-Capillary: 126 mg/dL — ABNORMAL HIGH (ref 70–99)
Glucose-Capillary: 104 mg/dL — ABNORMAL HIGH (ref 70–99)

## 2023-02-02 LAB — BASIC METABOLIC PANEL
Anion gap: 12 (ref 5–15)
BUN: 23 mg/dL — ABNORMAL HIGH (ref 6–20)
CO2: 22 mmol/L (ref 22–32)
Calcium: 9.5 mg/dL (ref 8.9–10.3)
Chloride: 100 mmol/L (ref 98–111)
Creatinine, Ser: 1.74 mg/dL — ABNORMAL HIGH (ref 0.44–1.00)
GFR, Estimated: 37 mL/min — ABNORMAL LOW (ref >60)
Glucose, Bld: 99 mg/dL (ref 70–99)
Potassium: 5 mmol/L (ref 3.5–5.1)
Sodium: 134 mmol/L — ABNORMAL LOW (ref 135–145)

## 2023-02-02 LAB — CBC
HCT: 28.3 % — ABNORMAL LOW (ref 36.0–46.0)
Hemoglobin: 8.4 g/dL — ABNORMAL LOW (ref 12.0–15.0)
MCH: 21.9 pg — ABNORMAL LOW (ref 26.0–34.0)
MCHC: 29.7 g/dL — ABNORMAL LOW (ref 30.0–36.0)
MCV: 73.7 fL — ABNORMAL LOW (ref 80.0–100.0)
Platelets: 712 10*3/uL — ABNORMAL HIGH (ref 150–400)
RBC: 3.84 MIL/uL — ABNORMAL LOW (ref 3.87–5.11)
RDW: 24.2 % — ABNORMAL HIGH (ref 11.5–15.5)
WBC: 11.3 10*3/uL — ABNORMAL HIGH (ref 4.0–10.5)
nRBC: 0 % (ref 0.0–0.2)

## 2023-02-02 LAB — APTT: aPTT: 69 s — ABNORMAL HIGH (ref 24–36)

## 2023-02-02 LAB — HEPARIN LEVEL (UNFRACTIONATED): Heparin Unfractionated: >1.1 [IU]/mL — ABNORMAL HIGH (ref 0.30–0.70)

## 2023-02-02 MED ORDER — ACETAMINOPHEN 500 MG PO TABS
1000.0000 mg | ORAL_TABLET | Freq: Four times a day (QID) | ORAL | Status: DC
  Administered 2023-02-02 – 2023-02-05 (×11): 1000 mg via ORAL
  Filled 2023-02-02 (×11): qty 2

## 2023-02-02 MED ORDER — MORPHINE SULFATE ER 15 MG PO TBCR
30.0000 mg | EXTENDED_RELEASE_TABLET | Freq: Two times a day (BID) | ORAL | Status: DC
  Administered 2023-02-02 – 2023-02-04 (×4): 30 mg via ORAL
  Filled 2023-02-02 (×4): qty 2

## 2023-02-02 MED ORDER — INSULIN ASPART 100 UNIT/ML IJ SOLN
4.0000 [IU] | Freq: Two times a day (BID) | INTRAMUSCULAR | Status: DC
  Administered 2023-02-03 – 2023-02-04 (×3): 4 [IU] via SUBCUTANEOUS

## 2023-02-02 NOTE — Progress Notes (Signed)
Physical Therapy Session Note  Patient Details  Name: Jennifer [name] MRN: [id number] Date of Birth: 1980/12/02  Today's Date: 02/02/2023 PT Individual Time: 1300-1340 PT Individual Time Calculation (min): 40 min   Short Term Goals: Week 1:  PT Short Term Goal 1 (Week 1): STG=LTG due to LOS  Skilled Therapeutic Interventions/Progress Updates:   Received pt sitting EOB with RN reconnecting IV - pt reports relief with RLE in dependent position but understands importance of keeping limb elevated. Pt agreeable to PT treatment and reported pain 10/10 in R residual limb (premedicated). Session with emphasis on functional mobility, pain management, residual limb wrapping, and generalized strengthening and endurance. Noted ace wrap falling off and pt agreed to allow therapist to re-wrap, but loosely. Transitioned into supine mod I and unwrapped residual limb. Noted new pink blister on posterior aspect of limb with continued drainage from old blister - RN notified and charge RN notified to take pictures. Attempted to don 4XL shrinker, however still wet from being washed and 3XL too tight. Therefore applied abdominal pad, gauze, and ace wraps (caution to avoid putting too much tension along distal aspect to avoid further irritating pt) - applied in sitting per pt request, although would have preferred pt in supine to wrap. Pt then performed the following exercises with emphasis on LE strength/ROM: -supine hip abduction x10 bilaterally -supine SLR 2x10 bilaterally -supine hip adduction pillow squeezes 2x10 with 3 second isometric hold Pt reported urge to void and transferred supine<>sitting EOB mod I and transferred into WC via squat<>pivot with CGA. Pt left in care of NT due to time restrictions.   Therapy Documentation Precautions:  Precautions Precautions: Fall Restrictions Weight Bearing Restrictions Per Provider Order: Yes RLE Weight Bearing Per Provider Order: Non weight bearing  Therapy/Group:  Individual Therapy Marlana Salvage Zaunegger Blima Rich PT, DPT 02/02/2023, 6:50 AM

## 2023-02-02 NOTE — Progress Notes (Signed)
PHARMACY - ANTICOAGULATION CONSULT NOTE  Pharmacy Consult for Eliquis >> IV heparin Indication:  limb ischemia, arterial occlusion, hx AFib  Allergies  Allergen Reactions   Demerol [Meperidine] Hives, Itching and Nausea And Vomiting   Meloxicam Nausea And Vomiting   Morphine Other (See Comments)    Other Reaction: tolerates hydromorphone   Oxycodone Hives, Itching and Nausea And Vomiting   Toradol [Ketorolac Tromethamine] Hives, Itching and Nausea And Vomiting    Patient Measurements: Height: 5\' 7"  (170.2 cm) Weight: 84.5 kg (186 lb 4.6 oz) IBW/kg (Calculated) : 61.6 Heparin Dosing Weight: 79.3 kg  Vital Signs: Temp: 98.7 F (37.1 C) (12/30 0302) BP: 120/70 (12/30 0302) Pulse Rate: 86 (12/30 0302)  Labs: Recent Labs    01/30/23 1239 01/30/23 1614 01/31/23 0542 01/31/23 0546 01/31/23 1640 02/01/23 0511 02/02/23 0514  HGB  --    < > 8.3*  --   --  8.4* 8.4*  HCT  --   --  27.5*  --   --  28.7* 28.3*  PLT  --   --  661*  --   --  697* 712*  APTT  --    < > 66*  --  68* 73* 69*  HEPARINUNFRC  --   --  >1.10*  --   --  >1.10* >1.10*  CREATININE 1.74*  --   --  1.78*  --   --  1.74*   < > = values in this interval not displayed.    Estimated Creatinine Clearance: 46.1 mL/min (A) (by C-G formula based on SCr of 1.74 mg/dL (H)).  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 Patient was started on Eliquis 5mg  BID post-operatively for hx Afib. New ischemic skin changes noted on 12/26 - pharmacy consulted to switch Eliquis back to IV heparin in preparation for R BKA revision surgery vs AKA.   12/30: Heparin level >1.1 which  continues to be falsely elevated due to recent Eliquis admin. aPTT is therapeutic  x2 at 69 sec. Hx menorrhagia and fibroids on Depo-Provera and is having vaginal bleeding, although this appears to have subsided  overnight. Some drainage from incision site noted. No other bleeding noted, Hgb low stable 8s, s/p 2 units RBC on 12/25, platelets are elevated.  Goal of Therapy:  Heparin level 0.3-0.7 units/ml aPTT 66-102 seconds Monitor platelets by anticoagulation protocol: Yes   Plan:  Continue heparin infusion at 1900 units/hr Daily aPTT, heparin level, CBC F/u VVS plans and ability to transition back to Eliquis  Thank you for involving pharmacy in this patient's care.  Jani Gravel, PharmD Clinical Pharmacist  02/02/2023 7:49 AM

## 2023-02-02 NOTE — Progress Notes (Addendum)
PROGRESS NOTE   Subjective/Complaints:  Pt reports she is anxious about possibility about possibility of a different surgery. Reports poor sleep last night due to pain.  ROS: +pain in residual limb and phantom limb pain-continued,  +urinary frequency- improved + Anxious this morning  Objective:   No results found.  Recent Labs    02/01/23 0511 02/02/23 0514  WBC 11.1* 11.3*  HGB 8.4* 8.4*  HCT 28.7* 28.3*  PLT 697* 712*   Recent Labs    01/31/23 0546 02/02/23 0514  NA 132* 134*  K 4.8 5.0  CL 99 100  CO2 23 22  GLUCOSE 128* 99  BUN 23* 23*  CREATININE 1.78* 1.74*  CALCIUM 9.1 9.5    Intake/Output Summary (Last 24 hours) at 02/02/2023 1024 Last data filed at 02/02/2023 0800 Gross per 24 hour  Intake 597 ml  Output --  Net 597 ml        Physical Exam: Vital Signs Blood pressure 120/70, pulse 86, temperature 98.7 F (37.1 C), resp. rate 18, height 5\' 7"  (1.702 m), weight 84.5 kg, SpO2 100%.   General: No acute distress, awake and alert, laying in bed.  Mood and affect are appropriate CV: RRR no m/r/g appreciated Pulm: CTAB no w/r/r appreciated Abd: soft, NTND, +BS throughout Extremities: R BKA with swelling, bruising. No edema to LLE.  Wearing stump sock Skin: +blistering around incision site, +ischemic changes   Assessment/Plan: 1. Functional deficits which require 3+ hours per day of interdisciplinary therapy in a comprehensive inpatient rehab setting. Physiatrist is providing close team supervision and 24 hour management of active medical problems listed below. Physiatrist and rehab team continue to assess barriers to discharge/monitor patient progress toward functional and medical goals  Care Tool:  Bathing    Body parts bathed by patient: Right arm, Left arm, Chest, Abdomen, Front perineal area, Buttocks, Right upper leg, Left upper leg, Face   Body parts bathed by helper: Left lower  leg Body parts n/a: Right lower leg   Bathing assist Assist Level: Minimal Assistance - Patient > 75%     Upper Body Dressing/Undressing Upper body dressing   What is the patient wearing?: Pull over shirt    Upper body assist Assist Level: Independent    Lower Body Dressing/Undressing Lower body dressing      What is the patient wearing?: Pants, Underwear/pull up     Lower body assist Assist for lower body dressing: Minimal Assistance - Patient > 75%     Toileting Toileting    Toileting assist Assist for toileting: Contact Guard/Touching assist     Transfers Chair/bed transfer  Transfers assist     Chair/bed transfer assist level: Supervision/Verbal cueing     Locomotion Ambulation   Ambulation assist   Ambulation activity did not occur: Safety/medical concerns (pain)  Assist level: Contact Guard/Touching assist Assistive device: Walker-rolling Max distance: 20'   Walk 10 feet activity   Assist  Walk 10 feet activity did not occur: Safety/medical concerns (pain)  Assist level: Contact Guard/Touching assist Assistive device: Walker-rolling   Walk 50 feet activity   Assist Walk 50 feet with 2 turns activity did not occur: Safety/medical concerns (pain)  Walk 150 feet activity   Assist Walk 150 feet activity did not occur: Safety/medical concerns (pain)         Walk 10 feet on uneven surface  activity   Assist Walk 10 feet on uneven surfaces activity did not occur: Safety/medical concerns (pain)         Wheelchair     Assist Is the patient using a wheelchair?: Yes Type of Wheelchair: Manual    Wheelchair assist level: Independent Max wheelchair distance: 113ft    Wheelchair 50 feet with 2 turns activity    Assist        Assist Level: Independent   Wheelchair 150 feet activity     Assist      Assist Level: Independent   Blood pressure 120/70, pulse 86, temperature 98.7 F (37.1 C), resp. rate  18, height 5\' 7"  (1.702 m), weight 84.5 kg, SpO2 100%.  Medical Problem List and Plan: 1. Functional deficits secondary to R BKA PAD             -patient may shower if RLE covered             -ELOS/Goals: 9-12 days, PT/OT mod I, goal 02/03/23             Grounds pass ordered  Limb guard ordered  -Vascular surgery considering more proximal amputation, MD to evaluate later today 2.  Antithrombotics: switched from eliquis to heparin             -antiplatelet therapy: N/A 3. Pain Management: decrease dilaudid to 2mg  and gabapentin to 400mg  given drowsiness. Long acting morphine added --On robaxin 1000 mg TID does not seen to help will use  change to  flexeril 10mg  TID-12/22--increase gabapentin to 800mg  TID Given GFR 48 will dose 800mg  q 12 h Has not taken hydrocodone q6h,3 times yesterday , discussed she may take another time per day DIscussed with OT, may benefit from TENS unit, has PT next  Add lidoderm patch right post knee -01/31/23 pain manageable overall, cont regimen, but advised pt to elevate the stump to help with swelling; unclear if she should be wearing her shrinker? -12/30 discussed with pharmacy.  Will increase MS Contin to 30 mg, stop scheduled hydrocodone  4. Mood/Behavior/Sleep:  LCSW to follow for evaluation and support.              -antipsychotic agents: N/A 5. Neuropsych/cognition: This patient is capable of making decisions on her own behalf. 6. Skin/Wound Care: Routine pressure relief measures.  7. Fluids/Electrolytes/Nutrition: Monitor I/O. Monitor labs  -01/31/23 BMP fairly stable, hyponatremia as below, Cr stable 1.78 8.  H/o A flutter/PAF: Monitor HR TID--continue Cardizem and Apixaban             --monitor for symptoms with increase in activity.  9. T1DM: Hgb A1c-9.2                         --Continue Insulin glargline bid--reports compliance at home.   D/c ISS, decreased CBG checks to AC/HS  Decrease novolog to 10U BID  Controlled   -12/30 CBGs  controlled CBG (last 3)  Recent Labs    02/01/23 1620 02/01/23 2035 02/02/23 0620  GLUCAP 128* 133* 106*    10. Hyponatremia: Improved from 127 @ admission to 134             --Monitor for now  -01/31/23 up to 132, stable, cont monitoring.  11. CKD3a?:  Per chart review SCr-1.56  on 08/24 and  1.33 @ admission.              --Monitor renal status with serial checks. Avoid nephrotoxic medications. Encouraging oral hydration.   -01/31/23 Cr 1.78 but stable from yesterday, monitor -12/30 creatinine 1.74, monitor 12. Acute on chronic  iron deficiency anemia:  Has been transfused with 4 units PRBCs, latest 12/25 -iron d/ced due to constipation --Continue to monitor H&H for stability.    --hx of menorrhagia/Fibroids-->on depo with good results PTA but has had recurrance w/clots past 3 days.  Hgb reviewed and improved  Transfuse if <7.0- recheck in am , no dizziness with position changes, recheck in am   -01/31/23 Hgb 8.3 today, stable, monitor -02/01/23 Hgb 8.4 today, doing great; vaginal bleeding subsiding too -12/30 hemoglobin stable at 8.4    Latest Ref Rng & Units 02/02/2023    5:14 AM 02/01/2023    5:11 AM 01/31/2023    5:42 AM  CBC  WBC 4.0 - 10.5 K/uL 11.3  11.1  12.3   Hemoglobin 12.0 - 15.0 g/dL 8.4  8.4  8.3   Hematocrit 36.0 - 46.0 % 28.3  28.7  27.5   Platelets 150 - 400 K/uL 712  697  661     14. Obesity. BMI 34 on admission. Dietary counseling 15. Constipation. Miralax, senokot. Changed senna to 2 tabs HS, milk of magnesia ordered 12/25  -02/01/23 LBM yesterday; cont regimen 16. Hyperkalemia. Monitor labs, lokelma ordered 12/27, repeat labs in AM  -01/31/23 K 4.8 today, monitor with routine labs    Latest Ref Rng & Units 02/02/2023    5:14 AM 01/31/2023    5:46 AM 01/30/2023   12:39 PM  BMP  Glucose 70 - 99 mg/dL 99  119  53   BUN 6 - 20 mg/dL 23  23  25    Creatinine 0.44 - 1.00 mg/dL 1.47  8.29  5.62   Sodium 135 - 145 mmol/L 134  132  132   Potassium 3.5  - 5.1 mmol/L 5.0  4.8  5.5   Chloride 98 - 111 mmol/L 100  99  96   CO2 22 - 32 mmol/L 22  23  22    Calcium 8.9 - 10.3 mg/dL 9.5  9.1  9.6      17. HTN. Lisinopril and hydrochlorothiazide have been stopped. BP stable, continue to hold, magnesium supplement started HS  -12/28-30/24 controlled   Vitals:   01/29/23 1850 01/29/23 2123 01/30/23 0226 01/30/23 0432  BP: 113/70 125/82 117/77 99/72   01/30/23 0615 01/30/23 1336 01/30/23 1934 01/31/23 0452  BP: 110/78 115/71 120/75 138/82   01/31/23 1935 02/01/23 1315 02/01/23 2001 02/02/23 0302  BP: 120/71 110/66 116/67 120/70     18. Blistering at incision site: Vibra scheduled, d/c'd 12/25  19. Leukocytosis: Vibra scheduled, reviewed and improved, d/c'd 12/25  20. 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 Active smoker: denies desire to smoke, nicotine patch d/ced  22. Vaginal bleeding: hx of fibroids and menorrhagia, seems to be slowing as of 02/01/23; monitor H&H with heparin use.      LOS: 10 days A FACE TO FACE EVALUATION WAS PERFORMED  Fanny Dance 02/02/2023, 10:24 AM

## 2023-02-02 NOTE — Progress Notes (Signed)
Had pharmacy review meds for any contributing factors for elevated K+. Heparin was started on 12/26 and can raise K+ a little but she tends to be boderline high likely due to CKD. Recheck BMET in am and if trend up again may need low K diet. BS low ac lunch. SSI was d/c last week by attending. Will decrease meal coverage from 9 unit to 5 units bid as tends to drop with increase in activity.

## 2023-02-02 NOTE — Significant Event (Signed)
Hypoglycemic Event  CBG: 59  Treatment: 4 oz juice/soda  Symptoms: None  Follow-up CBG: Time:1220 CBG Result:73  Possible Reasons for Event: Unknown  Comments/MD notified: Pam Love, PA-C on unit, notified of results. New orders received.    Marlyne Beards, Suzi Roots

## 2023-02-02 NOTE — Progress Notes (Signed)
Occupational Therapy Session Note  Patient Details  Name: Jennifer [name] MRN: [id number] Date of Birth: [birth year]  Today's Date: 02/02/2023 OT Individual Time: 0920-1000 & 1350-1430 OT Individual Time Calculation (min): 40 min & 40 min   Short Term Goals: Week 1:  OT Short Term Goal 1 (Week 1): STG = LTG due to ELOS  Skilled Therapeutic Interventions/Progress Updates:  Session 1 Skilled OT intervention completed with focus on limb care, pain management, functional transfers. Pt received seated EOB, agreeable to session. 10/10 pain reported in Rt residual limb; not due for meds per nursing but did receive at end of session. OT offered rest breaks, repositioning and manual massage for pain reduction.  Bed noted to have drainage on pillow cases from Rt residual limb wound. Pt agreeable to doff shrinker and did so with supervision. Pt utilized skin inspection mirror to observe areas of pain, with continued peeling blister noted, and base of stump cool to the touch with ischemic changes. Discussed with pt about extension in rehab due to change in medical status and continuous heparin needed right now and pt receptive. To promote autonomy, OT gave pt choice of donning new shrinker or ACE wrap, with pt preferring ACE. OT applied 2 non adherent dressing to open wound areas, then wrapped ACE in figure 8 position for equal pressure. Applied the wrap loosely to assist with pain management.  Completed all sit > stands and stand pivot transfers with CGA using bed rail or grab bar during session with cues for safety and management of IV pole etc. Transferred EOB > w/c > BSC over toilet > w/c > EOB during session.   Pt continent of urinary void. CGA for all toileting steps. Washed hands with supervision seated at sink. When back in bed, OT encouraged lying on Lt side with pillows placed in between legs for off loading of Rt residual limb and pain management. Pt remained in Lt side lying, with bed alarm  on/activated, and with all needs in reach at end of session.  Session 2 Skilled OT intervention completed with focus on BUE exercises and POC education. Pt received seated in w/c, agreeable to session. 10/10 pain reported in Rt residual limb; pre-medicated. OT offered rest breaks, repositioning throughout for pain reduction.  Pt declined self-care needs. Transported dependently in w/c <> north tower for time conservation.  Enabled pt to look outside to boost morale during exercises. Pt with questions again (demonstrating poor recall) of what would happen if she didn't choose to have a revision on her BKA. However with re-education, and discussion on potentially life at risk, pt with good awareness stating "well I would choose to have limb revised if it meant I could live, I have kids."  Seated in w/c, pt completed the following BUE exercises to promote BUE strength/endurance needed for independence with functional transfers and BADLs: (With red theraband) 15 reps Horizontal abduction Self-anchored bicep flexion each arm Therapist anchored scapular retraction each arm Shoulder external rotation Shoulder diagonal pulls  Back in room pt remained seated in w/c, with chair alarm on/activated, and with all needs in reach at end of session.   Therapy Documentation Precautions:  Precautions Precautions: Fall Restrictions Weight Bearing Restrictions Per Provider Order: Yes RLE Weight Bearing Per Provider Order: Non weight bearing    Therapy/Group: Individual Therapy  Melvyn Novas, MS, OTR/L  02/02/2023, 3:33 PM

## 2023-02-02 NOTE — Progress Notes (Signed)
Patient ID: Jennifer [name], female   DOB: 1980/04/05, 42 y.o.   MRN: [id number]  Per medical team, waiting for vascular to determine if pt is appropriate for discharge, AND if they will decide to do revision R BKA due to pain complaints. SW waiting on follow-up.  Cecile Sheerer, MSW, LCSW Office: [phone] Cell: [phone] Fax: [phone]

## 2023-02-02 NOTE — Progress Notes (Signed)
  Progress Note    02/02/2023 7:36 AM Hospital Day 10  Subjective:  sleeping  afebrile  Vitals:   02/01/23 2001 02/02/23 0302  BP: 116/67 120/70  Pulse: 86 86  Resp: 17 18  Temp: 98.3 F (36.8 C) 98.7 F (37.1 C)  SpO2: 100% 100%    Physical Exam: General:  no distress Lungs:  non labored Extremities:  stump sock in place right BKA  CBC    Component Value Date/Time   WBC 11.3 (H) 02/02/2023 0514   RBC 3.84 (L) 02/02/2023 0514   HGB 8.4 (L) 02/02/2023 0514   HGB 12.0 06/01/2013 1805   HCT 28.3 (L) 02/02/2023 0514   HCT 39.4 06/01/2013 1805   PLT 712 (H) 02/02/2023 0514   PLT 325 06/01/2013 1805   MCV 73.7 (L) 02/02/2023 0514   MCV 71 (L) 06/01/2013 1805   MCH 21.9 (L) 02/02/2023 0514   MCHC 29.7 (L) 02/02/2023 0514   RDW 24.2 (H) 02/02/2023 0514   RDW 18.1 (H) 06/01/2013 1805   LYMPHSABS 2.8 01/29/2023 0541   MONOABS 1.0 01/29/2023 0541   EOSABS 0.5 01/29/2023 0541   BASOSABS 0.1 01/29/2023 0541    BMET    Component Value Date/Time   NA 134 (L) 02/02/2023 0514   NA 135 (L) 06/01/2013 1805   K 5.0 02/02/2023 0514   K 3.6 06/01/2013 1805   CL 100 02/02/2023 0514   CL 103 06/01/2013 1805   CO2 22 02/02/2023 0514   CO2 25 06/01/2013 1805   GLUCOSE 99 02/02/2023 0514   GLUCOSE 179 (H) 06/01/2013 1805   BUN 23 (H) 02/02/2023 0514   BUN 11 06/01/2013 1805   CREATININE 1.74 (H) 02/02/2023 0514   CREATININE 0.79 06/01/2013 1805   CALCIUM 9.5 02/02/2023 0514   CALCIUM 9.1 06/01/2013 1805   GFRNONAA 37 (L) 02/02/2023 0514   GFRNONAA >60 06/01/2013 1805   GFRAA >60 10/08/2016 1820   GFRAA >60 06/01/2013 1805    INR    Component Value Date/Time   INR 1.0 01/15/2023 0925     Intake/Output Summary (Last 24 hours) at 02/02/2023 0736 Last data filed at 02/01/2023 1800 Gross per 24 hour  Intake 477 ml  Output --  Net 477 ml     Assessment/Plan:  41 y.o. 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 Wallace Cullens at [facility] now with ischemic changes   Hospital Day  10  -pt sleeping and given she has been having pain, I did not wake her.   -pt seen yesterday and overall stable.  Most likely will require more proximal amputation but will allow to demarcate to see if the right BKA can be salvaged -continue heparin gtt -MD to evaluate later today   Doreatha Massed, PA-C [facility] [phone] 02/02/2023 7:36 AM

## 2023-02-03 DIAGNOSIS — Z794 Long term (current) use of insulin: Secondary | ICD-10-CM

## 2023-02-03 DIAGNOSIS — T8789 Other complications of amputation stump: Secondary | ICD-10-CM | POA: Diagnosis not present

## 2023-02-03 DIAGNOSIS — D649 Anemia, unspecified: Secondary | ICD-10-CM | POA: Diagnosis not present

## 2023-02-03 DIAGNOSIS — E11649 Type 2 diabetes mellitus with hypoglycemia without coma: Secondary | ICD-10-CM

## 2023-02-03 DIAGNOSIS — M79604 Pain in right leg: Secondary | ICD-10-CM | POA: Diagnosis not present

## 2023-02-03 DIAGNOSIS — I10 Essential (primary) hypertension: Secondary | ICD-10-CM | POA: Diagnosis not present

## 2023-02-03 DIAGNOSIS — S88111A Complete traumatic amputation at level between knee and ankle, right lower leg, initial encounter: Secondary | ICD-10-CM | POA: Diagnosis not present

## 2023-02-03 LAB — CBC
HCT: 28.9 % — ABNORMAL LOW (ref 36.0–46.0)
Hemoglobin: 8.4 g/dL — ABNORMAL LOW (ref 12.0–15.0)
MCH: 21.6 pg — ABNORMAL LOW (ref 26.0–34.0)
MCHC: 29.1 g/dL — ABNORMAL LOW (ref 30.0–36.0)
MCV: 74.5 fL — ABNORMAL LOW (ref 80.0–100.0)
Platelets: 721 10*3/uL — ABNORMAL HIGH (ref 150–400)
RBC: 3.88 MIL/uL (ref 3.87–5.11)
RDW: 24.5 % — ABNORMAL HIGH (ref 11.5–15.5)
WBC: 9.2 10*3/uL (ref 4.0–10.5)
nRBC: 0 % (ref 0.0–0.2)

## 2023-02-03 LAB — BASIC METABOLIC PANEL
Anion gap: 13 (ref 5–15)
BUN: 25 mg/dL — ABNORMAL HIGH (ref 6–20)
CO2: 20 mmol/L — ABNORMAL LOW (ref 22–32)
Calcium: 9.4 mg/dL (ref 8.9–10.3)
Chloride: 101 mmol/L (ref 98–111)
Creatinine, Ser: 1.75 mg/dL — ABNORMAL HIGH (ref 0.44–1.00)
GFR, Estimated: 36 mL/min — ABNORMAL LOW (ref >60)
Glucose, Bld: 96 mg/dL (ref 70–99)
Potassium: 5 mmol/L (ref 3.5–5.1)
Sodium: 134 mmol/L — ABNORMAL LOW (ref 135–145)

## 2023-02-03 LAB — GLUCOSE, CAPILLARY
Glucose-Capillary: 102 mg/dL — ABNORMAL HIGH (ref 70–99)
Glucose-Capillary: 87 mg/dL (ref 70–99)
Glucose-Capillary: 86 mg/dL (ref 70–99)
Glucose-Capillary: 87 mg/dL (ref 70–99)

## 2023-02-03 LAB — APTT: aPTT: 47 s — ABNORMAL HIGH (ref 24–36)

## 2023-02-03 LAB — HEPARIN LEVEL (UNFRACTIONATED): Heparin Unfractionated: 1.09 [IU]/mL — ABNORMAL HIGH (ref 0.30–0.70)

## 2023-02-03 MED ORDER — INSULIN GLARGINE-YFGN 100 UNIT/ML ~~LOC~~ SOLN
36.0000 [IU] | Freq: Two times a day (BID) | SUBCUTANEOUS | Status: DC
  Administered 2023-02-03 – 2023-02-04 (×2): 36 [IU] via SUBCUTANEOUS
  Filled 2023-02-03 (×3): qty 0.36

## 2023-02-03 MED ORDER — SODIUM CHLORIDE 0.9 % IV SOLN: INTRAVENOUS | Status: AC

## 2023-02-03 NOTE — Progress Notes (Signed)
 PROGRESS NOTE   Subjective/Complaints:  Patient anxious about possible surgery.  Later in the morning she reported she was wanting to have surgery to her residual limb.  She continues to have pain in her residual limb, medications are helping.  ROS: +pain in residual limb and phantom limb pain-continued,  +urinary frequency- improved + Anxious this morning Denies chest pain, shortness of breath, abdominal pain  Objective:   No results found.  Recent Labs    02/02/23 0514 02/03/23 0511  WBC 11.3* 9.2  HGB 8.4* 8.4*  HCT 28.3* 28.9*  PLT 712* 721*   Recent Labs    02/02/23 0514 02/03/23 0511  NA 134* 134*  K 5.0 5.0  CL 100 101  CO2 22 20*  GLUCOSE 99 96  BUN 23* 25*  CREATININE 1.74* 1.75*  CALCIUM  9.5 9.4    Intake/Output Summary (Last 24 hours) at 02/03/2023 1257 Last data filed at 02/03/2023 0706 Gross per 24 hour  Intake 1577.69 ml  Output --  Net 1577.69 ml        Physical Exam: Vital Signs Blood pressure 127/76, pulse 87, temperature 99.2 F (37.3 C), resp. rate 16, height 5' 7 (1.702 m), weight 84.5 kg, SpO2 100%.   General: No acute distress, awake and alert, laying in bed.  Mood and affect are appropriate CV: RRR no m/r/g appreciated Pulm: CTAB no w/r/r appreciated Abd: soft, NTND, +BS throughout Extremities: R BKA with swelling, bruising. Wearing stump sock Distal residual limb cool to touch  Skin: +ischemic changes Reviewed images from vascular note  Assessment/Plan: 1. Functional deficits which require 3+ hours per day of interdisciplinary therapy in a comprehensive inpatient rehab setting. Physiatrist is providing close team supervision and 24 hour management of active medical problems listed below. Physiatrist and rehab team continue to assess barriers to discharge/monitor patient progress toward functional and medical goals  Care Tool:  Bathing    Body parts bathed by  patient: Right arm, Left arm, Chest, Abdomen, Front perineal area, Buttocks, Right upper leg, Left upper leg, Face   Body parts bathed by helper: Left lower leg Body parts n/a: Right lower leg   Bathing assist Assist Level: Minimal Assistance - Patient > 75%     Upper Body Dressing/Undressing Upper body dressing   What is the patient wearing?: Pull over shirt    Upper body assist Assist Level: Independent    Lower Body Dressing/Undressing Lower body dressing      What is the patient wearing?: Pants, Underwear/pull up     Lower body assist Assist for lower body dressing: Minimal Assistance - Patient > 75%     Toileting Toileting    Toileting assist Assist for toileting: Contact Guard/Touching assist     Transfers Chair/bed transfer  Transfers assist     Chair/bed transfer assist level: Supervision/Verbal cueing     Locomotion Ambulation   Ambulation assist   Ambulation activity did not occur: Safety/medical concerns (pain)  Assist level: Contact Guard/Touching assist Assistive device: Walker-rolling Max distance: 20'   Walk 10 feet activity   Assist  Walk 10 feet activity did not occur: Safety/medical concerns (pain)  Assist level: Contact Guard/Touching assist Assistive device: Walker-rolling  Walk 50 feet activity   Assist Walk 50 feet with 2 turns activity did not occur: Safety/medical concerns (pain)         Walk 150 feet activity   Assist Walk 150 feet activity did not occur: Safety/medical concerns (pain)         Walk 10 feet on uneven surface  activity   Assist Walk 10 feet on uneven surfaces activity did not occur: Safety/medical concerns (pain)         Wheelchair     Assist Is the patient using a wheelchair?: Yes Type of Wheelchair: Manual    Wheelchair assist level: Independent Max wheelchair distance: 158ft    Wheelchair 50 feet with 2 turns activity    Assist        Assist Level: Independent    Wheelchair 150 feet activity     Assist      Assist Level: Independent   Blood pressure 127/76, pulse 87, temperature 99.2 F (37.3 C), resp. rate 16, height 5' 7 (1.702 m), weight 84.5 kg, SpO2 100%.  Medical Problem List and Plan: 1. Functional deficits secondary to R BKA PAD             -patient may shower if RLE covered             -ELOS/Goals: 9-12 days, PT/OT mod I, goal 02/03/23             Grounds pass ordered  Limb guard ordered  -Vascular surgery considering more proximal amputation, vascular PA was contacted, Dr. Magda and they will follow-up 2.  Antithrombotics: switched from eliquis  to heparin              -antiplatelet therapy: N/A 3. Pain Management: decrease dilaudid  to 2mg  and gabapentin  to 400mg  given drowsiness. Long acting morphine  added --On robaxin  1000 mg TID does not seen to help will use  change to  flexeril  10mg  TID-12/22--increase gabapentin  to 800mg  TID Given GFR 48 will dose 800mg  q 12 h Has not taken hydrocodone  q6h,3 times yesterday , discussed she may take another time per day DIscussed with OT, may benefit from TENS unit, has PT next  Add lidoderm  patch right post knee -01/31/23 pain manageable overall, cont regimen, but advised pt to elevate the stump to help with swelling; unclear if she should be wearing her shrinker? -12/30 discussed with pharmacy.  Will increase MS Contin  to 30 mg, stop scheduled hydrocodone   4. Mood/Behavior/Sleep:  LCSW to follow for evaluation and support.              -antipsychotic agents: N/A 5. Neuropsych/cognition: This patient is capable of making decisions on her own behalf. 6. Skin/Wound Care: Routine pressure relief measures.  7. Fluids/Electrolytes/Nutrition: Monitor I/O. Monitor labs  -01/31/23 BMP fairly stable, hyponatremia as below, Cr stable 1.78 8.  H/o A flutter/PAF: Monitor HR TID--continue Cardizem  and Apixaban              --monitor for symptoms with increase in activity.  9. T1DM: Hgb A1c-9.2                          --Continue Insulin  glargline bid--reports compliance at home.   D/c ISS, decreased CBG checks to AC/HS  Decrease novolog  to 10U BID  Controlled   -12/31 Hypoglycemia yesterday, meal coverage  decreased to 4 units, decrease lantus  to 36u BID CBG (last 3)  Recent Labs    02/02/23 2046 02/03/23 0558 02/03/23 1115  GLUCAP 104*  102* 87    10. Hyponatremia: Improved from 127 @ admission to 134             --Monitor for now  -01/31/23 up to 132, stable, cont monitoring.  11. CKD3a?:  Per chart review SCr-1.56 on 08/24 and  1.33 @ admission.              --Monitor renal status with serial checks. Avoid nephrotoxic medications. Encouraging oral hydration.   -01/31/23 Cr 1.78 but stable from yesterday, monitor -12/31 creatinine 1.75, will check PVR x2, Cr  stable from last few readings but a little higher then prior, will start IVF for 1 day 12. 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    --hx of menorrhagia/Fibroids-->on depo with good results PTA but has had recurrance w/clots past 3 days.  Hgb reviewed and improved  Transfuse if <7.0- recheck in am , no dizziness with position changes, recheck in am   -01/31/23 Hgb 8.3 today, stable, monitor -02/01/23 Hgb 8.4 today, doing great; vaginal bleeding subsiding too -12/31 hemoglobin stable at 8.4    Latest Ref Rng & Units 02/03/2023    5:11 AM 02/02/2023    5:14 AM 02/01/2023    5:11 AM  CBC  WBC 4.0 - 10.5 K/uL 9.2  11.3  11.1   Hemoglobin 12.0 - 15.0 g/dL 8.4  8.4  8.4   Hematocrit 36.0 - 46.0 % 28.9  28.3  28.7   Platelets 150 - 400 K/uL 721  712  697     14. Obesity. BMI 34 on admission. Dietary counseling 15. Constipation. Miralax , senokot. Changed senna to 2 tabs HS, milk of magnesia ordered 12/25  -02/02/23 LBM yesterday; cont regimen   16. Hyperkalemia. Monitor labs, lokelma   ordered 12/27, repeat labs in AM  -01/31/23 K 4.8 today, monitor with routine labs  -12/31 stable at 5.0, consider low K diet if increases further    Latest Ref Rng & Units 02/03/2023    5:11 AM 02/02/2023    5:14 AM 01/31/2023    5:46 AM  BMP  Glucose 70 - 99 mg/dL 96  99  871   BUN 6 - 20 mg/dL 25  23  23    Creatinine 0.44 - 1.00 mg/dL 8.24  8.25  8.21   Sodium 135 - 145 mmol/L 134  134  132   Potassium 3.5 - 5.1 mmol/L 5.0  5.0  4.8   Chloride 98 - 111 mmol/L 101  100  99   CO2 22 - 32 mmol/L 20  22  23    Calcium  8.9 - 10.3 mg/dL 9.4  9.5  9.1      17. HTN. Lisinopril  and hydrochlorothiazide  have been stopped. BP stable, continue to hold, magnesium  supplement started HS  -12/28-31/24 controlled   Vitals:   01/30/23 0432 01/30/23 0615 01/30/23 1336 01/30/23 1934  BP: 99/72 110/78 115/71 120/75   01/31/23 0452 01/31/23 1935 02/01/23 1315 02/01/23 2001  BP: 138/82 120/71 110/66 116/67   02/02/23 0302 02/02/23 1434 02/02/23 1926 02/03/23 0409  BP: 120/70 129/75 114/85 127/76     18. Blistering at incision site: Vibra  scheduled, d/c'd 12/25  19. Leukocytosis: Vibra  scheduled, reviewed and improved, d/c'd 12/25  20. 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 Active smoker: denies desire to smoke, nicotine  patch d/ced  22. Vaginal bleeding: hx of fibroids and menorrhagia,  seems to be slowing as of 02/01/23; monitor H&H with heparin  use.      LOS: 11 days A FACE TO FACE EVALUATION WAS PERFORMED  Murray Collier 02/03/2023, 12:57 PM

## 2023-02-03 NOTE — Progress Notes (Addendum)
  Progress Note    02/03/2023 7:33 AM Hospital Day 11  Subjective:  sitting on side of the bed.  At this moment, pain is ok.  She has questions about plan as she really wants to go home.   Tm 99.5 now 99.2   Vitals:   02/02/23 1926 02/03/23 0409  BP: 114/85 127/76  Pulse: 94 87  Resp: 17 16  Temp: 98.7 F (37.1 C) 99.2 F (37.3 C)  SpO2: 100% 100%    Physical Exam: General:  sitting on side of bed in no distress Lungs:  non labored Extremities:  right BKA with posterior flap cool to touch  02/03/2023      02/02/2023   CBC    Component Value Date/Time   WBC 9.2 02/03/2023 0511   RBC 3.88 02/03/2023 0511   HGB 8.4 (L) 02/03/2023 0511   HGB 12.0 06/01/2013 1805   HCT 28.9 (L) 02/03/2023 0511   HCT 39.4 06/01/2013 1805   PLT 721 (H) 02/03/2023 0511   PLT 325 06/01/2013 1805   MCV 74.5 (L) 02/03/2023 0511   MCV 71 (L) 06/01/2013 1805   MCH 21.6 (L) 02/03/2023 0511   MCHC 29.1 (L) 02/03/2023 0511   RDW 24.5 (H) 02/03/2023 0511   RDW 18.1 (H) 06/01/2013 1805   LYMPHSABS 2.8 01/29/2023 0541   MONOABS 1.0 01/29/2023 0541   EOSABS 0.5 01/29/2023 0541   BASOSABS 0.1 01/29/2023 0541    BMET    Component Value Date/Time   NA 134 (L) 02/03/2023 0511   NA 135 (L) 06/01/2013 1805   K 5.0 02/03/2023 0511   K 3.6 06/01/2013 1805   CL 101 02/03/2023 0511   CL 103 06/01/2013 1805   CO2 20 (L) 02/03/2023 0511   CO2 25 06/01/2013 1805   GLUCOSE 96 02/03/2023 0511   GLUCOSE 179 (H) 06/01/2013 1805   BUN 25 (H) 02/03/2023 0511   BUN 11 06/01/2013 1805   CREATININE 1.75 (H) 02/03/2023 0511   CREATININE 0.79 06/01/2013 1805   CALCIUM  9.4 02/03/2023 0511   CALCIUM  9.1 06/01/2013 1805   GFRNONAA 36 (L) 02/03/2023 0511   GFRNONAA >60 06/01/2013 1805   GFRAA >60 10/08/2016 1820   GFRAA >60 06/01/2013 1805    INR    Component Value Date/Time   INR 1.0 01/15/2023 0925     Intake/Output Summary (Last 24 hours) at 02/03/2023 0733 Last data filed at  02/03/2023 0706 Gross per 24 hour  Intake 1697.69 ml  Output --  Net 1697.69 ml     Assessment/Plan:  42 y.o. female s/p  right BKA on 01/17/2023 by Dr. Elnor at [facility] now with ischemic changes   Hospital Day 11  -stump demarcating and posterior flap is cool to touch.  Discussed with pt that she is at high risk of more proximal amputation but giving every opportunity to improve.   -she would like to discuss with MD plan as she is looking to go home at some point.   -continue heparin  for afib until decision made about surgery.    Lucie Apt, PA-C [facility] [phone] 02/03/2023 7:33 AM  VASCULAR STAFF ADDENDUM: I have independently interviewed and examined the patient. I agree with the above.  Plan right above-knee amputation Thursday, 02/04/2022.  Debby SAILOR. Magda, MD [facility] Phone Number: [phone] 02/03/2023 2:00 PM

## 2023-02-03 NOTE — Progress Notes (Signed)
 Patient ID: AYDEN APODACA, female   DOB: [birth year], 42 y.o.   MRN: [id number]  Per medical team, pt will be here through Thursday as waiting on surgery to be scheduled.   Graeme Jude, MSW, LCSW Office: [phone] Cell: [phone] Fax: [phone]

## 2023-02-03 NOTE — Discharge Summary (Signed)
 Physician Discharge Summary  Patient ID: Jennifer [name] MRN: [id number] DOB/AGE: [birth year] 42 y.o.  Admit date: 01/23/2023 Discharge date: 02/05/2023  Discharge Diagnoses:  Principal Problem:   Unilateral complete BKA, right, initial encounter Jennifer [name]) Active Problems:   Essential hypertension   Acute on chronic blood loss anemia   Acute kidney injury superimposed on chronic kidney disease (HCC)   Diabetic peripheral neuropathy (HCC)   Poorly controlled type 2 diabetes mellitus with peripheral neuropathy (HCC)   Iron  deficiency anemia   Limb ischemia   Serum potassium elevated   Discharged Condition: stable  Significant Diagnostic Studies: DG Chest Port 1 View Result Date: 01/23/2023 CLINICAL DATA:  Vasculitis. EXAM: PORTABLE CHEST 1 VIEW COMPARISON:  05/26/2022 FINDINGS: The lungs are clear without focal pneumonia, edema, pneumothorax or pleural effusion. The cardiopericardial silhouette is within normal limits for size. No acute bony abnormality. IMPRESSION: No active disease. Electronically Signed   By: Jennifer [name] M.D.   On: 01/23/2023 14:55     Labs:  Basic Metabolic Panel: Recent Labs  Lab 01/30/23 1239 01/31/23 0546 02/02/23 0514 02/03/23 0511 02/05/23 0638  NA 132* 132* 134* 134* 133*  K 5.5* 4.8 5.0 5.0 5.0  CL 96* 99 100 101 102  CO2 22 23 22  20* 20*  GLUCOSE 53* 128* 99 96 111*  BUN 25* 23* 23* 25* 21*  CREATININE 1.74* 1.78* 1.74* 1.75* 1.56*  CALCIUM  9.6 9.1 9.5 9.4 9.4    CBC: Recent Labs  Lab 02/02/23 0514 02/03/23 0511 02/04/23 0541  WBC 11.3* 9.2 9.9  HGB 8.4* 8.4* 8.2*  HCT 28.3* 28.9* 27.9*  MCV 73.7* 74.5* 74.2*  PLT 712* 721* 755*    CBG: Recent Labs  Lab 02/04/23 1222 02/04/23 1634 02/04/23 2139 02/05/23 0621 02/05/23 1118  GLUCAP 90 106* 128* 112* 97    Brief HPI:   Jennifer [name] is a 42 y.o. female with history of T1DM, HTN, A-flutter, ongoing tobacco use who was admitted to [name] on 01/15/2023 with 2-day history of RLE  pain with coldness, numbness and difficulty walking.  She was found to have ischemic RLE with lack of DP/PT on Dopplers.  She also noted to be anemic with hemoglobin of 7.5 and blood sugars 602 at admission.  She was started on IV heparin  and taken to the OR for mechanical thrombectomy of right popliteal artery and right anterior tibial artery with stent placement by Jennifer [name].  She continued to have severe rest pain of right foot and underwent right BKA on 12/14 by Jennifer [name].  Postop pain has continued to be an issue.  Gabapentin  added for phantom pain and residual limb.  She had recurrent drop in H&H requiring additional unit PRBC on 12/14.  Therapy was working with patient was limited by pain and weakness.  CIR was recommended due to functional decline.   Hospital Course: Jennifer [name] was admitted to rehab 01/23/2023 for inpatient therapies to consist of PT and OT at least three hours five days a week. Past admission physiatrist, therapy team and rehab RN have worked together to provide customized collaborative inpatient rehab. During patient's stay in rehab weekly team conferences were held to monitor patient's progress, set goals and discuss barriers to discharge. At admission, patient required min assist with basic ADL tasks and with mobility.   Blood pressures were monitored on TID basis and has been stable.  Follow-up CBC shows H&H to be stable in 8 range.  Thrombocytosis continues and is stable.  Diabetes has been  monitored with ac/hs CBG checks and SSI was use prn for tighter BS control.  Insulin  glargine was started to 31 mg twice daily meal coverage was discontinued due to issues with recurrent hypoglycemia recently.  Pain control was an issue but was reasonably managed with use of Dilaudid  prn.  Flexeril  was scheduled at 10 mg 3 times daily in addition to baclofen  as needed.  Gabapentin  was increased to 400 mg 4 times daily to help with neuropathic symptoms.  She has had 2-3+ edema in her residual  Ace wrap and/or shrinker.  She has had blistering due to amount of edema.  On 12/16 she was noted to have a flap as well as large ruptured blister with reports of increasing pain.   Jennifer [name] was consulted for input and felt that she would likely require revision. Eliquis  was discontinued and she was started on IV heparin . Her stump was monitored as patient wanted to salvage stump if able. She continued to have severe pain with ongoing ischemic changes and was agreeable for surgery. She was taken to OR for R-AKA on 02/05/23.    Medications at discharge:  acetaminophen   1,000 mg Oral QID   cyclobenzaprine   10 mg Oral TID   diltiazem   180 mg Oral Daily   gabapentin   400 mg Oral QID   insulin  aspart  0-9 Units Subcutaneous TID WC   insulin  glargine-yfgn  31 Units Subcutaneous BID   iron  polysaccharides  150 mg Oral BID AC   lidocaine   1 patch Transdermal Q24H   magnesium  gluconate  250 mg Oral QHS   morphine   45 mg Oral Q12H   pantoprazole   40 mg Oral Daily   polyethylene glycol  17 g Oral Daily   senna  2 tablet Oral QHS   traZODone   100 mg Oral QHS     Diet: Carb modified  Special Instructions: Monitor BS ac/hs and use SSI for elevated BS  Disposition: Acute hospital    Signed: Sharlet GORMAN [name] 02/05/2023, 11:49 AM

## 2023-02-03 NOTE — Progress Notes (Signed)
 Physical Therapy Weekly Progress Note  Patient Details  Name: Jennifer [name] MRN: [id number] Date of Birth: [birth year]  Beginning of progress report period: January 24, 2023 End of progress report period: February 03, 2023  Patient has met 3 of 10 long term goals. Pt's LOS recently extended due to poor wound healing associated with ischemic changes in R residual limb. Pt is currently able to perform bed mobility mod I using bed features, stand<>pivot transfers with/without RW and CGA/close supervision, WC propulsion 152ft using BUE and mod I, ambulate ~68ft with RW and CGA, and navigate 2 6in steps with 1 handrail and mod A using shower chair technique. Pt continues to be limited by high pain levels and ischemic changes in residual limb. Currently awaiting decision from vascular to determine if pt will undergo R BKA revision.   Patient continues to demonstrate the following deficits muscle weakness and muscle joint tightness, decreased cardiorespiratoy endurance, and decreased standing balance, decreased postural control, decreased balance strategies, and difficulty maintaining precautions and therefore will continue to benefit from skilled PT intervention to increase functional independence with mobility.  Patient progressing toward long term goals..  Continue plan of care.  PT Short Term Goals Week 1:  PT Short Term Goal 1 (Week 1): STG=LTG due to LOS Week 2:  PT Short Term Goal 1 (Week 2): STG=LTG due to extended LOS from poor wound healing  Skilled Therapeutic Interventions/Progress Updates:  Ambulation/gait training;Discharge planning;Functional mobility training;Psychosocial support;Therapeutic Activities;Balance/vestibular training;Disease management/prevention;Neuromuscular re-education;Skin care/wound management;Therapeutic Exercise;Wheelchair propulsion/positioning;DME/adaptive equipment instruction;Pain management;Splinting/orthotics;UE/LE Strength taining/ROM;Community  reintegration;Patient/family education;Stair training;UE/LE Coordination activities;Functional electrical stimulation   Therapy Documentation Precautions:  Precautions Precautions: Fall Restrictions Weight Bearing Restrictions Per Provider Order: Yes RLE Weight Bearing Per Provider Order: Non weight bearing  Therapy/Group: Individual Therapy [name] Therisa Stains PT, DPT 02/03/2023, 7:04 AM

## 2023-02-03 NOTE — Plan of Care (Signed)
 Patient with dressing off this AM, refused new dressing.  Problem: Consults Goal: RH LIMB LOSS PATIENT EDUCATION Description: Description: See Patient Education module for eduction specifics. Outcome: Progressing Goal: Skin Care Protocol Initiated - if Braden Score 18 or less Description: If consults are not indicated, leave blank or document N/A Outcome: Progressing Goal: Diabetes Guidelines if Diabetic/Glucose > 140 Description: If diabetic or lab glucose is > 140 mg/dl - Initiate Diabetes/Hyperglycemia Guidelines & Document Interventions  Outcome: Progressing   Problem: RH SKIN INTEGRITY Goal: RH STG SKIN FREE OF INFECTION/BREAKDOWN Description: Skin to remain free from breakdown and incision to heal with no signs of infection with mod I assist. Outcome: Progressing Goal: RH STG MAINTAIN SKIN INTEGRITY WITH ASSISTANCE Description: STG Maintain Skin Integrity With mod I Assistance. Outcome: Progressing Goal: RH STG ABLE TO PERFORM INCISION/WOUND CARE W/ASSISTANCE Description: STG Able To Perform Incision/Wound Care With mod I Assistance. Outcome: Progressing   Problem: RH SAFETY Goal: RH STG ADHERE TO SAFETY PRECAUTIONS W/ASSISTANCE/DEVICE Description: STG Adhere to Safety Precautions With mod I Assistance/Device. Outcome: Progressing Goal: RH STG DECREASED RISK OF FALL WITH ASSISTANCE Description: STG Decreased Risk of Fall With mod I Assistance. Outcome: Progressing   Problem: RH PAIN MANAGEMENT Goal: RH STG PAIN MANAGED AT OR BELOW PT'S PAIN GOAL Description: < 4 on a 0-10 pain scale Outcome: Progressing   Problem: RH KNOWLEDGE DEFICIT LIMB LOSS Goal: RH STG INCREASE KNOWLEDGE OF SELF CARE AFTER LIMB LOSS Description: Patient will demonstrate knowledge of residual limb care, diabetes management, safety awareness with educational handouts and materials provided by staff during rehabilitation admission. Outcome: Progressing   Problem: Education: Goal: Ability to describe  self-care measures that may prevent or decrease complications (Diabetes Survival Skills Education) will improve Outcome: Progressing Goal: Individualized Educational Video(s) Outcome: Progressing   Problem: Coping: Goal: Ability to adjust to condition or change in health will improve Outcome: Progressing   Problem: Fluid Volume: Goal: Ability to maintain a balanced intake and output will improve Outcome: Progressing   Problem: Health Behavior/Discharge Planning: Goal: Ability to identify and utilize available resources and services will improve Outcome: Progressing Goal: Ability to manage health-related needs will improve Outcome: Progressing   Problem: Metabolic: Goal: Ability to maintain appropriate glucose levels will improve Outcome: Progressing   Problem: Nutritional: Goal: Maintenance of adequate nutrition will improve Outcome: Progressing Goal: Progress toward achieving an optimal weight will improve Outcome: Progressing   Problem: Skin Integrity: Goal: Risk for impaired skin integrity will decrease Outcome: Progressing   Problem: Tissue Perfusion: Goal: Adequacy of tissue perfusion will improve Outcome: Progressing

## 2023-02-03 NOTE — Progress Notes (Signed)
 Physical Therapy Session Note  Patient Details  Name: Jennifer [name] MRN: [id number] Date of Birth: 1980-12-12  Today's Date: 02/03/2023 PT Individual Time: 9154-9046 PT Individual Time Calculation (min): 68 min   Short Term Goals: Week 1:  PT Short Term Goal 1 (Week 1): STG=LTG due to LOS  Skilled Therapeutic Interventions/Progress Updates:   Received pt sitting in WC, pt agreeable to PT treatment, and continues to report high pain levels in R residual limb (premedicated). Removed ace wrap from prior PT as pt reporting it was too tight. Session with emphasis on pain management, functional mobility/transfers, generalized strengthening and endurance, dynamic standing balance/coordination, and ambulation.   Increased time spent providing emotional support/encouragement regarding pt's current medical state. Pt frustrated with uncertainty regarding what is to be done with residual limb and reported speaking with vascular this morning. Per RN, vascular planning on sending pt to OR on Thursday (due to holiday) with hopes to salvage BKA, if not plan for AKA - explained this to pt. Donned 4XL shrinker with min A using shrinker donning tube.   Pt reports her husband/kids just moved into new home and pt's husband got ramp installed and pt does not have any steps to worry about. Pt performed WC mobility 135ft using BUE and mod I to main therapy gym with emphasis on BUE strength. Stood with RW and CGA (cues for hand placement) and ambulated 48ft with RW and CGA to mat. Worked on ship broker therapy for pain management - instructed pt to maintain eye contact with mirror and to try to convince yourself that the mirror image is your R leg. Pt worked on the following activities/exercises with max cues to maintain eye contact in mirror during entire duration of treatment - pt drowsy and lethargic falling in/out of sleep:  -ankle circles x20 clockwise and counterclockwise -LAQ 2x10 keeping mirror in visual  range -hip flexion 2x10 -desensitization  -rubbing/massage with L hand for 1 minute  -rubbing/massage with washcloth for 1 minute  -touching toes to mirror 2x12 PA present briefly during session to confirm that pt is willing to proceed with surgery. Pt reported no pain relief with mirror therapy today but internally distracted and feeling mental block. Pt transferred mat<>WC via stand<>pivot without AD and supervision and requested to return to room. Transported back to room in [facility] dependently and pt requested to remain sitting up. Concluded session with pt sitting in WC, needs within reach, and seatbelt alarm on. Provided pt with ice pack for pain relief and water/snacks (cleared by RN).   Therapy Documentation Precautions:  Precautions Precautions: Fall Restrictions Weight Bearing Restrictions Per Provider Order: Yes RLE Weight Bearing Per Provider Order: Non weight bearing  Therapy/Group: Individual Therapy [name] Therisa Stains PT, DPT 02/03/2023, 6:52 AM

## 2023-02-03 NOTE — Progress Notes (Signed)
Physical Therapy Session Note  Patient Details  Name: Jennifer [name] MRN: [id number] Date of Birth: November 12, 1980  Today's Date: 02/02/2023 PT Individual Time:  1030-1130  PT Individual Time Calculation (min):  60 min  Short Term Goals: Week 1:  PT Short Term Goal 1 (Week 1): STG=LTG due to LOS  Skilled Therapeutic Interventions/Progress Updates:  Patient seated on EOB on entrance to room. RLE in gravity dependent positioning. Patient alert and agreeable to PT session.   Patient with no pain complaint at start of session. But does relate pain increase with movement/ touch to residual limb.   Wrap of LE is loose and ineffective. Two short stretch wraps used to secure abdominal pad to posterior end of residual limb for protection and dressing to blister/ wound. <20% stretch applied as pt relates that when tight, pain increases.   Pt able to perform lateral scoot, sit<>stand and pivot on LLE to reach w/c. Requires supervision to scoot and CGA/ MinA  to complete transfer.   Pt propelled wheelchair >100 ft to reach hallway outside of main gym with one brief rest break for arm fatigue. Pt then participates in propel for more than 70 feet per bout while performing tight turns through slalom course with cones placed 5 ft apart. Education provided re: pivot point of w/c and timing of turns in order to clear all cones. Pt with difficulty initially but after further education and visual cue for cone position, pt able to complete with less vc and less touch to cones to complete course. Cones then placed 4 feet together. Provided vc/ tc initially but then pt able to complete with touch to only 3 cones throughout. Pt then able to demo ability to propel toward and then back into 98foot wide space and lock brakes.   At end of session, pt relates pain from tightness of wrap and loosely re-applied with no stretch for improved comfort but related to pt that will need to be re-done throughout day d/t movement.    Patient seated upright in w/c at end of session with brakes locked, belt alarm set, and all needs within reach.   Therapy Documentation Precautions:  Precautions Precautions: Fall Restrictions Weight Bearing Restrictions Per Provider Order: Yes RLE Weight Bearing Per Provider Order: Non weight bearing  Pain: No pain related until light palpation to posterior calf.  Therapy/Group: Individual Therapy  Loel Dubonnet PT, DPT, CSRS 02/02/2023, 5:12 PM

## 2023-02-03 NOTE — Progress Notes (Addendum)
 Physical Therapy Session Note  Patient Details  Name: Jennifer [name] MRN: [id number] Date of Birth: January 13, 1981  Today's Date: 02/03/2023 PT Individual Time: 9199-9157 + 1300-1355 min PT Individual Time Calculation (min): 42 min  + 55 min (missed 20 minutes in PM session due to PT scheduling error - treated for 60 minute treatment instead of scheduled 75 minute treatment)  Short Term Goals: Week 2:  PT Short Term Goal 1 (Week 2): STG=LTG due to extended LOS from poor wound healing  Skilled Therapeutic Interventions/Progress Updates:   1st session: Pt sitting EOB on arrival. Reports 10/10 residual limb pain. Reports she received pain Rx before PT arrival. Pt discusses her frustration with recovery process, phantom pains, and concerned if she will need AKA revision or not. Discussed with her CIR options for peer support program and patient expressed interest - let primary team know and reached out to QC to help with coordinating this.   Pt completed stand pivot transfer with CGA and no AD into wheelchair. Transported for time to ortho rehab gym and assisted to mat table in similar manner. Sit>supine completed independently. Spent time in supine position with rolled towels under the end of residual limb to work on stretching R knee into extension. Measured with goniometer and patient lacking 35 degrees of terminal knee extension. Educated her on purpose of stretching and importance for eventual prothesis. Noted some wound seepage through her residual limb sock - removed to assess skin integrity. Noted some steady drippage from wound site - RN made aware. Provided ABD pad and ace wrapped residual limb with figure-8 wrapping to promote wound healing and shaping of her residual limb.  Returned patient to her room and she was left sitting upright with all her needs met.   2nd session: Pt sleeping in her wheelchair on arrival - agreeable to PT. Continues to report 10/10 R residual limb pain. Rest  breaks provided for pain management.   Transported in w/c to main rehab gym. Completed squat pivot transfer with supervision from w/c to mat table. Assisted into supine position without assist on mat table, pain limiting movement. Provided bolster under knees to limit the stretch. Completed mat table there-ex: -1x15 SAQ with bolster -1x15 glut sets -1x12 quad set with bolster -1x15 active SLR -1x15 hip abd/add -1x12 hip ext in L sidelying -1x12 knee to chest in L sidelying -1x15 hip abd in L sidelying -1x15 seated hip marches -1x20 seated hip adductor isometrics *rest breaks provided b/w sets for pain > fatigue.   Pt instructed in wheelchair propulsion - propelled herself 160ft with supervision assist from main gym to her room. Using BUE to propel but veering L, limited B shoulder extension for stroke efficiency.   Concluded session seated upright in wheelchair, left with all needs met. 4   Therapy Documentation Precautions:  Precautions Precautions: Fall Restrictions Weight Bearing Restrictions Per Provider Order: Yes RLE Weight Bearing Per Provider Order: Non weight bearing General:     Therapy/Group: Individual Therapy  Sherlean SHAUNNA Perks 02/03/2023, 7:58 AM

## 2023-02-03 NOTE — Patient Care Conference (Addendum)
 Inpatient RehabilitationTeam Conference and Plan of Care Update Date: 02/03/2023   Time: 12:54 PM    Patient Name: Jennifer [name]      Medical Record Number: [id number]  Date of Birth: [birth year] Sex: Female         Room/Bed: 4M02C/4M02C-01 Payor Info: Payor: [place] MEDICAID PREPAID HEALTH PLAN / Plan: [place] MEDICAID HEALTHY BLUE / Product Type: *No Product type* /    Admit Date/Time:  01/23/2023  3:14 PM  Primary Diagnosis:  Unilateral complete BKA, right, initial encounter [facility])  Hospital Problems: Principal Problem:   Unilateral complete BKA, right, initial encounter (HCC) Active Problems:   Essential hypertension   Acute on chronic blood loss anemia   Acute kidney injury superimposed on chronic kidney disease (HCC)   Diabetic peripheral neuropathy (HCC)   Poorly controlled type 2 diabetes mellitus with peripheral neuropathy (HCC)   Iron  deficiency anemia   Limb ischemia    Expected Discharge Date: Expected Discharge Date:  (OR pending)  Team Members Present: Physician leading conference: Dr. Murray Collier Social Worker Present: Graeme Jude, LCSWA Nurse Present: Barnie Ronde, RN PT Present: Therisa Stains, PT OT Present: Lorrayne Fritter, OT PPS Coordinator present : Eleanor Colon, SLP     Current Status/Progress Goal Weekly Team Focus  Bowel/Bladder   Patient continenet of B&B. Last BM 12/30.   Patient will remain continent of B&B.   Timed toileting to encourage contnenece of B&B.    Swallow/Nutrition/ Hydration               ADL's   Was functioning closer to goal level however due to change in meds/status with Rt residual limb, pt now requiring supervision for UB, min A for LB and CGA/min A for toileting.   Mod I   Barriers- poor wound healing, cues needed for safety/sequencing as result of new pain meds etc, functional endurance, dynamic standing balance    Mobility   bed mobility mod I, transfers with/without AD CGA/close supervision, gait 15-44ft with RW  and CGA, WC mobility 17ft mod I,   Mod I  barriers: pain management, possible revision, limb loss education, stair navigation, DME, dynamic standing balance/coordination,    Communication                Safety/Cognition/ Behavioral Observations               Pain   Patient requires frequent PRNs to relieve pain from BKA site/phantom pain.   Patient pain level to maintian below 5.   Provide patient with PRNs and other alternative to manage pain.    Skin   Maintian BKA site to prevent infection.   Prevent infection.  Keep incision CDI to precent breakdown and infection.      Discharge Planning:    Home with spouse; 1 level 4 ste right rail.  Team Discussion: Patient post right BKA with limb ischemia/poor incision healing on Heparin  drip awaiting demarcation and surgery for revision of limb.  Patient on target to meet rehab goals: yes, currently needs mod I with and without a RW for mobility, able to ambuate 15-20' with CGA and needs CGA for toileting.    *See Care Plan and progress notes for long and short-term goals.   Revisions to Treatment Plan:  Donning tube for shrinker application   Teaching Needs: Safety, medications, diet modifications, skin care, transfers, toileting, etc.   Current Barriers to Discharge: Home enviroment access/layout  Possible Resolutions to Barriers: Family education Ramp for entry to home DME:  TTB, Wheelchair, RW OP follow up services     Medical Summary Current Status: PAD, s/p BKA Right, Pain, T1DM, anemia, constipation, hyperkalemia, htn,  Barriers to Discharge: Medical stability;Uncontrolled Pain  Barriers to Discharge Comments: PAD, s/p BKA Right, Pain, T1DM, anemia, constipation, hyperkalemia, htn, Possible Resolutions to [employer] Focus: potential additional sugery on R residual limb, monitor CBC and BMP, monitor BP   Continued Need for Acute Rehabilitation Level of Care: The patient requires daily medical  management by a physician with specialized training in physical medicine and rehabilitation for the following reasons: Direction of a multidisciplinary physical rehabilitation program to maximize functional independence : Yes Medical management of patient stability for increased activity during participation in an intensive rehabilitation regime.: Yes Analysis of laboratory values and/or radiology reports with any subsequent need for medication adjustment and/or medical intervention. : Yes   I attest that I was present, lead the team conference, and concur with the assessment and plan of the team.   Fredericka Sober B 02/03/2023, 4:55 PM

## 2023-02-03 NOTE — Progress Notes (Signed)
 Occupational Therapy Weekly Progress Note  Patient Details  Name: Jennifer [name] MRN: [id number] Date of Birth: [birth year]  Beginning of progress report period: January 24, 2023 End of progress report period: February 03, 2023  Pt was functionally progressing towards meeting her LTGs with original plan to DC 12/31 at mod I level, however in a matter of days has had a change in medical status with poor wound healing on the Rt residual limb. Pt has been hyper-medicated, had increased lethargy, and lower activity tolerance while medical team observes with current intervention if her limb can be salvaged. Pt has participated to her fullest ability despite high pain levels and fatigue, however has not met any LTGs. DC date extended and is currently pending if a revision surgery is warranted. Pt currently at a CGA/min A level for BADLs and functional transfers but requires cueing for safety.  Patient continues to demonstrate the following deficits: muscle weakness, decreased cardiorespiratoy endurance, decreased coordination, decreased awareness and decreased safety awareness, and decreased standing balance and difficulty maintaining precautions and therefore will continue to benefit from skilled OT intervention to enhance overall performance with BADL and Reduce care partner burden.  Patient progressing toward long term goals..  Continue plan of care.  OT Short Term Goals Week 1:  OT Short Term Goal 1 (Week 1): STG = LTG due to ELOS Week 2:  OT Short Term Goal 1 (Week 2): Pt will complete donning of shrinker with supervision OT Short Term Goal 2 (Week 2): Pt will initiate switching out shrinker/limb dressing 75% of the time without cueing OT Short Term Goal 3 (Week 2): Pt will self-propel 100 ft in w/c on unlevel surfaces with supervision to simulate public environment    Jennifer FORBES Fritter, MS, OTR/L  02/03/2023, 11:42 AM

## 2023-02-03 NOTE — Progress Notes (Signed)
 This chaplain responded to PMT NP-Eric consult for Pt. spiritual care and support. The chaplain is present with the Pt. during her therapy break. The chaplain understands the Pt. pain is persistent today but the Pt. prefers to talk about the exploratory procedure scheduled for Thursday.   The chaplain understands the Pt. appreciates strong communication about her choices from the medical team. The Pt. described being told about the upcoming procedure. The chaplain listened as the Pt. rubbed her limb and described  her life as a lot of living left to do. The Pt. asks how much life is left in this limb? The chaplain is unable to answer the question and affirms the Pt. original request for positive communication.  The Pt. adds I don't want anyone speaking for me.  The chaplain paused and invited the Pt. to think about what if someone needed to make a decision for you, will they know what is important to you?  The Pt. accepted the chaplain's invitation for prayer and F/U spiritual care.  Chaplain Leeroy Hummer [phone]

## 2023-02-03 NOTE — Progress Notes (Signed)
 PHARMACY - ANTICOAGULATION CONSULT NOTE  Pharmacy Consult for Eliquis  >> IV heparin  Indication:  limb ischemia, arterial occlusion, hx AFib  Allergies  Allergen Reactions   Demerol  [Meperidine ] Hives, Itching and Nausea And Vomiting   Meloxicam Nausea And Vomiting   Morphine  Other (See Comments)    Other Reaction: tolerates hydromorphone    Oxycodone  Hives, Itching and Nausea And Vomiting   Toradol  [Ketorolac  Tromethamine ] Hives, Itching and Nausea And Vomiting    Patient Measurements: Height: 5' 7 (170.2 cm) Weight: 84.5 kg (186 lb 4.6 oz) IBW/kg (Calculated) : 61.6 Heparin  Dosing Weight: 79.3 kg  Vital Signs: Temp: 99.2 F (37.3 C) (12/31 0409) BP: 127/76 (12/31 0409) Pulse Rate: 87 (12/31 0409)  Labs: Recent Labs    02/01/23 0511 02/02/23 0514 02/03/23 0511  HGB 8.4* 8.4* 8.4*  HCT 28.7* 28.3* 28.9*  PLT 697* 712* 721*  APTT 73* 69* 47*  HEPARINUNFRC >1.10* >1.10* 1.09*  CREATININE  --  1.74* 1.75*    Estimated Creatinine Clearance: 45.9 mL/min (A) (by C-G formula based on SCr of 1.75 mg/dL (H)).  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 Patient was started on Eliquis  5mg  BID post-operatively for hx Afib. New ischemic skin changes noted on 12/26 - pharmacy consulted to switch Eliquis  back to IV heparin  in preparation for R BKA revision surgery vs AKA.   12/31: Heparin  level 1.09, which continues to be falsely elevated due to recent Eliquis  admin. aPTT now subtherapeutic at 47 seconds after multiple days therapeutic on 1900 units/hour. No signs of bleeding noted. Per RN, the patient reportedly lost her IV early AM and heparin  was paused for ~ 45 minutes. Level was drawn approximately 1 hour after heparin  was resumed. As patient has been therapeutic for multiple days at 1900 units/hour, will continue at current rate and  recheck in AM.  Hgb stable at 8.4, PLT remain elevated in the 700s.   Goal of Therapy:  Heparin  level 0.3-0.7 units/ml aPTT 66-102 seconds Monitor platelets by anticoagulation protocol: Yes   Plan:  Continue heparin  infusion at 1900 units/hour Daily aPTT, heparin  level, CBC F/u VVS plans and ability to transition back to Eliquis   Thank you for involving pharmacy in this patient's care.  Massie Fila, PharmD Clinical Pharmacist  02/03/2023 7:58 AM

## 2023-02-04 DIAGNOSIS — E1142 Type 2 diabetes mellitus with diabetic polyneuropathy: Secondary | ICD-10-CM

## 2023-02-04 DIAGNOSIS — D649 Anemia, unspecified: Secondary | ICD-10-CM | POA: Diagnosis not present

## 2023-02-04 DIAGNOSIS — N179 Acute kidney failure, unspecified: Secondary | ICD-10-CM

## 2023-02-04 DIAGNOSIS — I10 Essential (primary) hypertension: Secondary | ICD-10-CM | POA: Diagnosis not present

## 2023-02-04 DIAGNOSIS — N189 Chronic kidney disease, unspecified: Secondary | ICD-10-CM

## 2023-02-04 DIAGNOSIS — T8789 Other complications of amputation stump: Secondary | ICD-10-CM | POA: Diagnosis not present

## 2023-02-04 DIAGNOSIS — Z7189 Other specified counseling: Secondary | ICD-10-CM

## 2023-02-04 DIAGNOSIS — Z515 Encounter for palliative care: Secondary | ICD-10-CM

## 2023-02-04 DIAGNOSIS — S88111A Complete traumatic amputation at level between knee and ankle, right lower leg, initial encounter: Secondary | ICD-10-CM | POA: Diagnosis not present

## 2023-02-04 DIAGNOSIS — I998 Other disorder of circulatory system: Secondary | ICD-10-CM

## 2023-02-04 DIAGNOSIS — M79604 Pain in right leg: Secondary | ICD-10-CM | POA: Diagnosis not present

## 2023-02-04 LAB — CRYOGLOBULIN

## 2023-02-04 LAB — GLUCOSE, CAPILLARY
Glucose-Capillary: 123 mg/dL — ABNORMAL HIGH (ref 70–99)
Glucose-Capillary: 59 mg/dL — ABNORMAL LOW (ref 70–99)
Glucose-Capillary: 41 mg/dL — CL (ref 70–99)
Glucose-Capillary: 49 mg/dL — ABNORMAL LOW (ref 70–99)
Glucose-Capillary: 90 mg/dL (ref 70–99)
Glucose-Capillary: 106 mg/dL — ABNORMAL HIGH (ref 70–99)
Glucose-Capillary: 128 mg/dL — ABNORMAL HIGH (ref 70–99)

## 2023-02-04 LAB — CBC
HCT: 27.9 % — ABNORMAL LOW (ref 36.0–46.0)
Hemoglobin: 8.2 g/dL — ABNORMAL LOW (ref 12.0–15.0)
MCH: 21.8 pg — ABNORMAL LOW (ref 26.0–34.0)
MCHC: 29.4 g/dL — ABNORMAL LOW (ref 30.0–36.0)
MCV: 74.2 fL — ABNORMAL LOW (ref 80.0–100.0)
Platelets: 755 10*3/uL — ABNORMAL HIGH (ref 150–400)
RBC: 3.76 MIL/uL — ABNORMAL LOW (ref 3.87–5.11)
RDW: 23.9 % — ABNORMAL HIGH (ref 11.5–15.5)
WBC: 9.9 10*3/uL (ref 4.0–10.5)
nRBC: 0 % (ref 0.0–0.2)

## 2023-02-04 LAB — APTT: aPTT: 67 s — ABNORMAL HIGH (ref 24–36)

## 2023-02-04 LAB — HEPARIN LEVEL (UNFRACTIONATED): Heparin Unfractionated: 1.04 [IU]/mL — ABNORMAL HIGH (ref 0.30–0.70)

## 2023-02-04 MED ORDER — INSULIN GLARGINE-YFGN 100 UNIT/ML ~~LOC~~ SOLN
32.0000 [IU] | Freq: Two times a day (BID) | SUBCUTANEOUS | Status: DC
  Filled 2023-02-04: qty 0.32

## 2023-02-04 MED ORDER — INSULIN ASPART 100 UNIT/ML IJ SOLN
0.0000 [IU] | Freq: Three times a day (TID) | INTRAMUSCULAR | Status: DC
Start: 2023-02-04 — End: 2023-02-05

## 2023-02-04 MED ORDER — MORPHINE SULFATE ER 15 MG PO TBCR
15.0000 mg | EXTENDED_RELEASE_TABLET | Freq: Once | ORAL | Status: AC
  Administered 2023-02-04: 15 mg via ORAL
  Filled 2023-02-04: qty 1

## 2023-02-04 MED ORDER — INSULIN GLARGINE-YFGN 100 UNIT/ML ~~LOC~~ SOLN
31.0000 [IU] | Freq: Two times a day (BID) | SUBCUTANEOUS | Status: DC
  Administered 2023-02-04 – 2023-02-05 (×2): 31 [IU] via SUBCUTANEOUS
  Filled 2023-02-04 (×3): qty 0.31

## 2023-02-04 MED ORDER — MORPHINE SULFATE ER 15 MG PO TBCR
45.0000 mg | EXTENDED_RELEASE_TABLET | Freq: Two times a day (BID) | ORAL | Status: DC
  Administered 2023-02-04 – 2023-02-05 (×2): 45 mg via ORAL
  Filled 2023-02-04 (×2): qty 3

## 2023-02-04 NOTE — Plan of Care (Signed)
  Problem: RH SKIN INTEGRITY Goal: RH STG SKIN FREE OF INFECTION/BREAKDOWN Description: Skin to remain free from breakdown and incision to heal with no signs of infection with mod I assist. Outcome: Not Progressing; pt scheduled for AKA in the morning   Problem: RH PAIN MANAGEMENT Goal: RH STG PAIN MANAGED AT OR BELOW PT'S PAIN GOAL Description: < 4 on a 0-10 pain scale Outcome: Not Progressing ; pain medication was modified

## 2023-02-04 NOTE — Progress Notes (Signed)
 Hypoglycemic Event  CBG: 41  Treatment: 8 oz juice/soda  Symptoms:  cold  Follow-up CBG: Time:1222 CBG Result:90  Possible Reasons for Event: Inadequate meal intake  Comments/MD notified: MD notified Dr. Benjie Karvonen

## 2023-02-04 NOTE — Progress Notes (Signed)
 PHARMACY - ANTICOAGULATION CONSULT NOTE  Pharmacy Consult for Eliquis  >> IV heparin  Indication:  limb ischemia, arterial occlusion, hx AFib  Allergies  Allergen Reactions   Demerol  [Meperidine ] Hives, Itching and Nausea And Vomiting   Meloxicam Nausea And Vomiting   Morphine  Other (See Comments)    Other Reaction: tolerates hydromorphone    Oxycodone  Hives, Itching and Nausea And Vomiting   Toradol  [Ketorolac  Tromethamine ] Hives, Itching and Nausea And Vomiting    Patient Measurements: Height: 5' 7 (170.2 cm) Weight: 84.5 kg (186 lb 4.6 oz) IBW/kg (Calculated) : 61.6 Heparin  Dosing Weight: 79.3 kg  Vital Signs: Temp: 99 F (37.2 C) (01/01 0450) BP: 112/79 (01/01 0450) Pulse Rate: 88 (01/01 0450)  Labs: Recent Labs    02/02/23 0514 02/03/23 0511 02/04/23 0541  HGB 8.4* 8.4* 8.2*  HCT 28.3* 28.9* 27.9*  PLT 712* 721* 755*  APTT 69* 47* 67*  HEPARINUNFRC >1.10* 1.09* 1.04*  CREATININE 1.74* 1.75*  --     Estimated Creatinine Clearance: 45.9 mL/min (A) (by C-G formula based on SCr of 1.75 mg/dL (H)).  Assessment: 43 yo female admitted with acute R lower limb ischemia and underwent lower extremity angiogram stent to popliteal artery, and thrombectomy on 12/12 with subsequent R BKA on 12/14. Patient was started on Eliquis  5mg  BID post-operatively for hx Afib. New ischemic skin changes noted on 12/26 - pharmacy consulted to switch Eliquis  back to IV heparin  in preparation for R BKA revision surgery vs AKA.   12/31: Heparin  level 1.04, which continues to be falsely elevated due to recent Eliquis  admin. aPTT therapeutic at 67 on heparin  at 1900 units/hour. No signs of bleeding or issues with heparin  infusion noted.  Hgb stable at 8.2, PLT remain elevated in the 700s.   Goal of Therapy:  Heparin  level 0.3-0.7 units/ml aPTT 66-102 seconds Monitor platelets by anticoagulation protocol: Yes   Plan:  Continue heparin  infusion at 1900 units/hour Daily aPTT, heparin  level,  CBC F/u VVS plans and ability to transition back to Eliquis   Thank you for involving pharmacy in this patient's care.  Massie Fila, PharmD Clinical Pharmacist  02/04/2023 7:34 AM

## 2023-02-04 NOTE — Progress Notes (Signed)
  Progress Note    02/04/2023 10:06 AM * No surgery found *  Subjective: No new complaints  Vitals:   02/03/23 1935 02/04/23 0450  BP: 118/85 112/79  Pulse: 91 88  Resp: 17 17  Temp: 98 F (36.7 C) 99 F (37.2 C)  SpO2: 99% 99%    Physical Exam: Stable ischemic changes right below-knee amputation stump  CBC    Component Value Date/Time   WBC 9.9 02/04/2023 0541   RBC 3.76 (L) 02/04/2023 0541   HGB 8.2 (L) 02/04/2023 0541   HGB 12.0 06/01/2013 1805   HCT 27.9 (L) 02/04/2023 0541   HCT 39.4 06/01/2013 1805   PLT 755 (H) 02/04/2023 0541   PLT 325 06/01/2013 1805   MCV 74.2 (L) 02/04/2023 0541   MCV 71 (L) 06/01/2013 1805   MCH 21.8 (L) 02/04/2023 0541   MCHC 29.4 (L) 02/04/2023 0541   RDW 23.9 (H) 02/04/2023 0541   RDW 18.1 (H) 06/01/2013 1805   LYMPHSABS 2.8 01/29/2023 0541   MONOABS 1.0 01/29/2023 0541   EOSABS 0.5 01/29/2023 0541   BASOSABS 0.1 01/29/2023 0541    BMET    Component Value Date/Time   NA 134 (L) 02/03/2023 0511   NA 135 (L) 06/01/2013 1805   K 5.0 02/03/2023 0511   K 3.6 06/01/2013 1805   CL 101 02/03/2023 0511   CL 103 06/01/2013 1805   CO2 20 (L) 02/03/2023 0511   CO2 25 06/01/2013 1805   GLUCOSE 96 02/03/2023 0511   GLUCOSE 179 (H) 06/01/2013 1805   BUN 25 (H) 02/03/2023 0511   BUN 11 06/01/2013 1805   CREATININE 1.75 (H) 02/03/2023 0511   CREATININE 0.79 06/01/2013 1805   CALCIUM  9.4 02/03/2023 0511   CALCIUM  9.1 06/01/2013 1805   GFRNONAA 36 (L) 02/03/2023 0511   GFRNONAA >60 06/01/2013 1805   GFRAA >60 10/08/2016 1820   GFRAA >60 06/01/2013 1805    INR    Component Value Date/Time   INR 1.0 01/15/2023 0925     Intake/Output Summary (Last 24 hours) at 02/04/2023 1006 Last data filed at 02/04/2023 0713 Gross per 24 hour  Intake 1372.92 ml  Output --  Net 1372.92 ml     Assessment:  43 y.o. female is s/p below-knee amputation which is not healing  Plan: OR tomorrow for right above-knee amputation, n.p.o. past  midnight.   Selenia Mihok C. Sheree, MD [facility] Office: [phone] Pager: [phone]  02/04/2023 10:06 AM

## 2023-02-04 NOTE — Progress Notes (Signed)
 Daily Progress Note   Patient Name: Jennifer [name]       Date: 02/04/2023 DOB: 1980/11/22  Age: 43 y.o. MRN#: [id number] Attending Physician: Lorilee Sven SQUIBB, MD Primary Care Physician: [facility] Admit Date: 01/23/2023 Length of Stay: 12 days  Reason for Consultation/Follow-up: Establishing goals of care  HPI/Patient Profile:  43 y.o. female  with past medical history of T1DM, HTN, A flutter, ongoing tobacco use who was admitted to [facility] on 01/23/2023 with functional deficits requiring rehab secondary to right BKA due to PAD pain management, skin/wound care, and others.    Palliative medicine was consulted for GOC conversations.  Subjective:   Subjective: Chart Reviewed. Updates received. Patient Assessed. Created space and opportunity for patient  and family to explore thoughts and feelings regarding current medical situation.  Today's Discussion: Today saw the patient at bedside, OT was present and excused herself for conversation.  I reviewed her visit with the chaplain yesterday which she stated was very helpful and she enjoyed thoroughly.  I shared that I would leave a note for Jennifer [name] (chaplain) to request she return next week when she is back in the office.  I also shared that at any time if the patient would like another chaplain visit she asked the nurse and they could page the chaplain on-call.  We spent time talking about plans moving forward.  She states that they are going to do a further amputation tomorrow.  She seems to be a little upset about this.  However, she states that she is in so much pain currently with the vascular disease to the stump that I cannot keep living like this.  We discussed that further amputation would also pose further limits.  However, these limits would likely be less and if she continued with her pain.  She does not like being asleep and on pain medication all the time.  We talked about the choice she has being a mom.  I  shared that while further amputation may or may not limit her ability for prosthesis and possibly limit her ability to function in various roles, although we discussed that there would be other ways that she continue to thrive in these roles.  I did share that this point her goals are clear.  Palliative medicine is functioning to provide ongoing support.  I shared that the chaplain team is available to continue to support.  She does seem to express some concern about being in the hospital for so long thus far, possibly needing to be in the hospital yet longer.  I shared that palliative medicine would likely back off, we are available for reconsult should further needs arise.  I provided emotional and general support through therapeutic listening, empathy, sharing of stories, therapeutic touch, and other techniques. I answered all questions and addressed all concerns to the best of my ability.  Review of Systems  Constitutional:  Positive for fatigue.  Respiratory:  Negative for chest tightness and shortness of breath.   Gastrointestinal:  Negative for abdominal pain, nausea and vomiting.  Musculoskeletal:        Admits phantom limb pain    Objective:   Vital Signs:  BP 112/79 (BP Location: Left Arm)   Pulse 88   Temp 99 F (37.2 C)   Resp 17   Ht 5' 7 (1.702 m)   Wt 84.5 kg   SpO2 99%   BMI 29.18 kg/m   Physical Exam  Palliative Assessment/Data: 50%  Existing Vynca/ACP Documentation: None  Assessment & Plan:   Impression: Present on Admission:  Unilateral complete BKA, right, initial encounter (HCC)  Poorly controlled type 2 diabetes mellitus with peripheral neuropathy (HCC)  Limb ischemia  Iron  deficiency anemia  Essential hypertension  Diabetic peripheral neuropathy (HCC)  Acute on chronic blood loss anemia  Acute kidney injury superimposed on chronic kidney disease (HCC)  43 year old female with acute presentation and chronic comorbidities as described above.  The patient just recently underwent a right BKA, stump is cool to the touch and demarcating. She is at risk for required more proximal amputation. She does not want to have this, but would excepted if needed. Her goals are clear and desires full code and full scope of care. This is not surprising given her young age and functional, enjoyable life at baseline.  Right now plan is for further amputation tomorrow. Overall prognosis guarded.   SUMMARY OF RECOMMENDATIONS   Full code Full scope of care At this time accepting of all interventions to prolong life Goals are clear Palliative medicine will back off at this time Please notify us  of any significant clinical change or new palliative needs  Symptom Management:  Per primary team PMT is available to assist as needed  Code Status: Full code  Prognosis: Unable to determine  Discharge Planning: To Be Determined  Discussed with: Patient, medical team, nursing team  Thank you for allowing us  to participate in the care of SAIDAH KEMPTON PMT will continue to support holistically.  Time Total: 45 min  Detailed review of medical records (labs, imaging, vital signs), medically appropriate exam, discussed with treatment team, counseling and education to patient, family, & staff, documenting clinical information, medication management, coordination of care  Camellia Kays, NP Palliative Medicine Team  Team Phone # [phone] (Nights/Weekends)  10/02/2020, 8:17 AM

## 2023-02-04 NOTE — Progress Notes (Signed)
 Patient ID: Jennifer [name], female   DOB: 04-07-1980, 43 y.o.   MRN: [id number]  Per pt and PT, vascular confirms surgery tomorrow at 1:30pm for AKA. Unsure on if pt will return. Medical team will confirm.   Graeme Jude, MSW, LCSW Office: [phone] Cell: [phone] Fax: [phone]

## 2023-02-04 NOTE — Progress Notes (Signed)
 Physical Therapy Session Note  Patient Details  Name: Jennifer [name] MRN: [id number] Date of Birth: Nov 02, 1980  Today's Date: 02/04/2023 PT Individual Time: 9154-9046 and [id number] PT Individual Time Calculation (min): 68 min  and 24 min Today's Date: 02/04/2023 PT Missed Time: 7 Minutes and 51 minutes Missed Time Reason: Pain and pain/fatigue  Short Term Goals: Week 1:  PT Short Term Goal 1 (Week 1): STG=LTG due to LOS Week 2:  PT Short Term Goal 1 (Week 2): STG=LTG due to extended LOS from poor wound healing  Skilled Therapeutic Interventions/Progress Updates:   Treatment Session 1 Received pt sitting in WC with RN and NT at bedside. Pt agreeable to PT treatment and reported pain 10+/10 in R residual limb (premedicated). Session with emphasis on pain management, limb care, functional mobility/transfers, generalized strengthening and endurance, and WC mobility. Provided pt with the following handouts: shrinker care guidelines, rehab timeline, limb protectors, contracture prevention, donning shrinker with shrinker donning tube, and additional information on amputee support group. Went through sensation, MMT, and pain interference questionnaire.   Removed shrinker and noted increased swelling, mild redness, tightness, and 2nd blister on posterior aspect of leg that was white/pink the other day now red/purple. Pt also with new/worsening blister above incision and warmth along entire limb (except distal end of residual limb which is ice cold) - charge RN notified and present to take pictures of limb. Vascular MD arrvied and reported plan for R AKA tomorrow at 1:30pm - treatment team made aware and MD arrived for morning rounds. Noted increased swelling in LLE - knee high teds applied for edema management. CSW then arrived briefly and updated on plan - unsure if pt will return to rehab after surgery tomorrow. Pt performed WC mobility 362ft using BUE and mod I to dayroom with emphasis on UE strength. Pt  performed seated LLE strengthening on Kinetron at 15 cm/sec for 2 minutes and 45 seconds with emphasis on glute/quad strength - limited by increased pain. Transported back to room in [facility] dependently and concluded session with pt sitting in WC, needs within reach, and provided pt with ice pack for pain relief. RN attending to care. 7 minutes missed of skilled physical therapy due to pain.   Treatment Session 2 Received pt long sitting in bed with R residual limb hanging off EOB, asleep. Upon wakening pt reported receiving morphine  and when therapist asked if pt was in any pain, pt stated I really don't know, I'm just really tired. Pt politely requesting to skip therapy due to difficulty maintaining arousal and therapist did not push pt as she finally appeared somewhat comfortable. Provided pt with HEP with BKA and AKA exercises and educated on frequency/duration/technique for the following exercises: -sidelying hip and knee AROM 2x10 -sidelying hip abduction 2x10 -supine hip abduction 2x10 -supine hip flexion 2x10 -single leg bridge 2x10 -supine hamstring stretch 3x30 second hold -quad sets 2x10 -prone hip extension 2x10 -prone knee flexion 2x10 -sidelying hip adduction 2x10 -supine SAQ 2x0 -supine glute squeezes x20 -half sit ups 2x10 Concluded session with pt semi-reclined in bed with all needs within reach. 51 minutes missed of skilled physical therapy due to pain/fatigue.   Therapy Documentation Precautions:  Precautions Precautions: Fall Restrictions Weight Bearing Restrictions Per Provider Order: Yes RLE Weight Bearing Per Provider Order: Non weight bearing  Therapy/Group: Individual Therapy [name] Therisa Stains PT, DPT 02/04/2023, 6:48 AM

## 2023-02-04 NOTE — Plan of Care (Signed)
  Problem: Consults Goal: RH LIMB LOSS PATIENT EDUCATION Description: Description: See Patient Education module for eduction specifics. Outcome: Progressing Goal: Skin Care Protocol Initiated - if Braden Score 18 or less Description: If consults are not indicated, leave blank or document N/A Outcome: Progressing Goal: Diabetes Guidelines if Diabetic/Glucose > 140 Description: If diabetic or lab glucose is > 140 mg/dl - Initiate Diabetes/Hyperglycemia Guidelines & Document Interventions  Outcome: Progressing   Problem: RH SKIN INTEGRITY Goal: RH STG SKIN FREE OF INFECTION/BREAKDOWN Description: Skin to remain free from breakdown and incision to heal with no signs of infection with mod I assist. Outcome: Progressing Goal: RH STG MAINTAIN SKIN INTEGRITY WITH ASSISTANCE Description: STG Maintain Skin Integrity With mod I Assistance. Outcome: Progressing Goal: RH STG ABLE TO PERFORM INCISION/WOUND CARE W/ASSISTANCE Description: STG Able To Perform Incision/Wound Care With mod I Assistance. Outcome: Progressing   Problem: RH SAFETY Goal: RH STG ADHERE TO SAFETY PRECAUTIONS W/ASSISTANCE/DEVICE Description: STG Adhere to Safety Precautions With mod I Assistance/Device. Outcome: Progressing Goal: RH STG DECREASED RISK OF FALL WITH ASSISTANCE Description: STG Decreased Risk of Fall With mod I Assistance. Outcome: Progressing   Problem: RH PAIN MANAGEMENT Goal: RH STG PAIN MANAGED AT OR BELOW PT'S PAIN GOAL Description: < 4 on a 0-10 pain scale Outcome: Progressing   Problem: RH KNOWLEDGE DEFICIT LIMB LOSS Goal: RH STG INCREASE KNOWLEDGE OF SELF CARE AFTER LIMB LOSS Description: Patient will demonstrate knowledge of residual limb care, diabetes management, safety awareness with educational handouts and materials provided by staff during rehabilitation admission. Outcome: Progressing   Problem: Education: Goal: Ability to describe self-care measures that may prevent or decrease  complications (Diabetes Survival Skills Education) will improve Outcome: Progressing Goal: Individualized Educational Video(s) Outcome: Progressing   Problem: Coping: Goal: Ability to adjust to condition or change in health will improve Outcome: Progressing   Problem: Fluid Volume: Goal: Ability to maintain a balanced intake and output will improve Outcome: Progressing   Problem: Health Behavior/Discharge Planning: Goal: Ability to identify and utilize available resources and services will improve Outcome: Progressing Goal: Ability to manage health-related needs will improve Outcome: Progressing   Problem: Metabolic: Goal: Ability to maintain appropriate glucose levels will improve Outcome: Progressing   Problem: Nutritional: Goal: Maintenance of adequate nutrition will improve Outcome: Progressing Goal: Progress toward achieving an optimal weight will improve Outcome: Progressing   Problem: Skin Integrity: Goal: Risk for impaired skin integrity will decrease Outcome: Progressing   Problem: Tissue Perfusion: Goal: Adequacy of tissue perfusion will improve Outcome: Progressing

## 2023-02-04 NOTE — Progress Notes (Addendum)
 PROGRESS NOTE   Subjective/Complaints:  AKA scheduled for tomorrow. She is having severe pain in her residual limb.   ROS: +pain in residual limb and phantom limb pain-continued,  +urinary frequency- improved + Anxious this morning Denies chest pain, shortness of breath, abdominal pain  Objective:   No results found.  Recent Labs    02/03/23 0511 02/04/23 0541  WBC 9.2 9.9  HGB 8.4* 8.2*  HCT 28.9* 27.9*  PLT 721* 755*   Recent Labs    02/02/23 0514 02/03/23 0511  NA 134* 134*  K 5.0 5.0  CL 100 101  CO2 22 20*  GLUCOSE 99 96  BUN 23* 25*  CREATININE 1.74* 1.75*  CALCIUM  9.5 9.4    Intake/Output Summary (Last 24 hours) at 02/04/2023 0916 Last data filed at 02/04/2023 9286 Gross per 24 hour  Intake 1372.92 ml  Output --  Net 1372.92 ml        Physical Exam: Vital Signs Blood pressure 112/79, pulse 88, temperature 99 F (37.2 C), resp. rate 17, height 5' 7 (1.702 m), weight 84.5 kg, SpO2 99%.   General: No acute distress, awake and alert, going to gym with therapy . Appears uncomfortable Mood and affect are appropriate CV: RRR no m/r/g appreciated Pulm: CTAB no w/r/r appreciated Abd: soft, NTND, +BS throughout Extremities: R BKA with swelling, bruising. Wearing stump sock Distal residual limb cool to touch  Skin: +ischemic changes   Assessment/Plan: 1. Functional deficits which require 3+ hours per day of interdisciplinary therapy in a comprehensive inpatient rehab setting. Physiatrist is providing close team supervision and 24 hour management of active medical problems listed below. Physiatrist and rehab team continue to assess barriers to discharge/monitor patient progress toward functional and medical goals  Care Tool:  Bathing    Body parts bathed by patient: Right arm, Left arm, Chest, Abdomen, Front perineal area, Buttocks, Right upper leg, Left upper leg, Face   Body parts bathed by  helper: Left lower leg Body parts n/a: Right lower leg   Bathing assist Assist Level: Minimal Assistance - Patient > 75%     Upper Body Dressing/Undressing Upper body dressing   What is the patient wearing?: Pull over shirt    Upper body assist Assist Level: Independent    Lower Body Dressing/Undressing Lower body dressing      What is the patient wearing?: Pants, Underwear/pull up     Lower body assist Assist for lower body dressing: Minimal Assistance - Patient > 75%     Toileting Toileting    Toileting assist Assist for toileting: Contact Guard/Touching assist     Transfers Chair/bed transfer  Transfers assist     Chair/bed transfer assist level: Supervision/Verbal cueing     Locomotion Ambulation   Ambulation assist   Ambulation activity did not occur: Safety/medical concerns (pain)  Assist level: Contact Guard/Touching assist Assistive device: Walker-rolling Max distance: 20'   Walk 10 feet activity   Assist  Walk 10 feet activity did not occur: Safety/medical concerns (pain)  Assist level: Contact Guard/Touching assist Assistive device: Walker-rolling   Walk 50 feet activity   Assist Walk 50 feet with 2 turns activity did not occur: Safety/medical concerns (  pain)         Walk 150 feet activity   Assist Walk 150 feet activity did not occur: Safety/medical concerns (pain)         Walk 10 feet on uneven surface  activity   Assist Walk 10 feet on uneven surfaces activity did not occur: Safety/medical concerns (pain)         Wheelchair     Assist Is the patient using a wheelchair?: Yes Type of Wheelchair: Manual    Wheelchair assist level: Independent Max wheelchair distance: 142ft    Wheelchair 50 feet with 2 turns activity    Assist        Assist Level: Independent   Wheelchair 150 feet activity     Assist      Assist Level: Independent   Blood pressure 112/79, pulse 88, temperature 99 F  (37.2 C), resp. rate 17, height 5' 7 (1.702 m), weight 84.5 kg, SpO2 99%.  Medical Problem List and Plan: 1. Functional deficits secondary to R BKA PAD             -patient may shower if RLE covered             -ELOS/Goals: 9-12 days, PT/OT mod I, goal 02/03/23             Grounds pass ordered  Limb guard ordered  -Vascular surgery planning for AKA tomorrow 2.  Antithrombotics: switched from eliquis  to heparin              -antiplatelet therapy: N/A 3. Pain Management: decrease dilaudid  to 2mg  and gabapentin  to 400mg  given drowsiness. Long acting morphine  added --On robaxin  1000 mg TID does not seen to help will use  change to  flexeril  10mg  TID-12/22--increase gabapentin  to 800mg  TID Given GFR 48 will dose 800mg  q 12 h Has not taken hydrocodone  q6h,3 times yesterday , discussed she may take another time per day DIscussed with OT, may benefit from TENS unit, has PT next  Add lidoderm  patch right post knee -01/31/23 pain manageable overall, cont regimen, but advised pt to elevate the stump to help with swelling; unclear if she should be wearing her shrinker? -12/30 discussed with pharmacy.  Will increase MS Contin  to 30 mg, stop scheduled hydrocodone  -1/1 Increase MS contin  to 45mg  Q12h  4. Mood/Behavior/Sleep:  LCSW to follow for evaluation and support.              -antipsychotic agents: N/A 5. Neuropsych/cognition: This patient is capable of making decisions on her own behalf. 6. Skin/Wound Care: Routine pressure relief measures.  7. Fluids/Electrolytes/Nutrition: Monitor I/O. Monitor labs  -01/31/23 BMP fairly stable, hyponatremia as below, Cr stable 1.78  Recheck tomorrow labs 8.  H/o A flutter/PAF: Monitor HR TID--continue Cardizem  and Apixaban              --monitor for symptoms with increase in activity.  9. T1DM: Hgb A1c-9.2                         --Continue Insulin  glargline bid--reports compliance at home.   D/c ISS, decreased CBG checks to AC/HS  Decrease novolog  to 10U  BID  Controlled   -12/31 Hypoglycemia yesterday, meal coverage  decreased to 4 units, decrease lantus  to 36u BID  1/1 decrease lantus  to 31u BID  1/1 addendum, hypoglycemia, stop mealtime insulin , start correctional CBG (last 3)  Recent Labs    02/03/23 1614 02/03/23 2015 02/04/23 0610  GLUCAP 86 87 123*  10. Hyponatremia: Improved from 127 @ admission to 134             --Monitor for now  -01/31/23 up to 132, stable, cont monitoring.  11. CKD3a?:  Per chart review SCr-1.56 on 08/24 and  1.33 @ admission.              --Monitor renal status with serial checks. Avoid nephrotoxic medications. Encouraging oral hydration.   -01/31/23 Cr 1.78 but stable from yesterday, monitor -12/31 creatinine 1.75, will check PVR x2, Cr  stable from last few readings but a little higher then prior, will start IVF for 1 day, recheck tomorrow 12. 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    --hx of menorrhagia/Fibroids-->on depo with good results PTA but has had recurrance w/clots past 3 days.  Hgb reviewed and improved  Transfuse if <7.0- recheck in am , no dizziness with position changes, recheck in am   -01/31/23 Hgb 8.3 today, stable, monitor -02/01/23 Hgb 8.4 today, doing great; vaginal bleeding subsiding too -1/1 Stable HGB 8.2    Latest Ref Rng & Units 02/04/2023    5:41 AM 02/03/2023    5:11 AM 02/02/2023    5:14 AM  CBC  WBC 4.0 - 10.5 K/uL 9.9  9.2  11.3   Hemoglobin 12.0 - 15.0 g/dL 8.2  8.4  8.4   Hematocrit 36.0 - 46.0 % 27.9  28.9  28.3   Platelets 150 - 400 K/uL 755  721  712     14. Obesity. BMI 34 on admission. Dietary counseling 15. Constipation. Miralax , senokot. Changed senna to 2 tabs HS, milk of magnesia ordered 12/25  -02/02/23 LBM yesterday; cont regimen   16. Hyperkalemia. Monitor labs, lokelma  ordered 12/27, repeat labs in AM  -01/31/23 K  4.8 today, monitor with routine labs  -12/31 stable at 5.0, consider low K diet if increases further    Latest Ref Rng & Units 02/03/2023    5:11 AM 02/02/2023    5:14 AM 01/31/2023    5:46 AM  BMP  Glucose 70 - 99 mg/dL 96  99  871   BUN 6 - 20 mg/dL 25  23  23    Creatinine 0.44 - 1.00 mg/dL 8.24  8.25  8.21   Sodium 135 - 145 mmol/L 134  134  132   Potassium 3.5 - 5.1 mmol/L 5.0  5.0  4.8   Chloride 98 - 111 mmol/L 101  100  99   CO2 22 - 32 mmol/L 20  22  23    Calcium  8.9 - 10.3 mg/dL 9.4  9.5  9.1      17. HTN. Lisinopril  and hydrochlorothiazide  have been stopped. BP stable, continue to hold, magnesium  supplement started HS  -1/1 BP controlled, continue to monitor    Vitals:   01/30/23 1934 01/31/23 0452 01/31/23 1935 02/01/23 1315  BP: 120/75 138/82 120/71 110/66   02/01/23 2001 02/02/23 0302 02/02/23 1434 02/02/23 1926  BP: 116/67 120/70 129/75 114/85   02/03/23 0409 02/03/23 1405 02/03/23 1935 02/04/23 0450  BP: 127/76 122/78 118/85 112/79     18. Blistering at incision site: Vibra  scheduled, d/c'd 12/25  19. Leukocytosis: Vibra  scheduled, reviewed and improved, d/c'd 12/25  20. 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 Active smoker: denies desire to smoke, nicotine  patch d/ced  22. Vaginal bleeding: hx of fibroids and  menorrhagia, seems to be slowing as of 02/01/23; monitor H&H with heparin  use.      LOS: 12 days A FACE TO FACE EVALUATION WAS PERFORMED  Murray Collier 02/04/2023, 9:16 AM

## 2023-02-04 NOTE — Progress Notes (Signed)
 Occupational Therapy Session Note  Patient Details  Name: Jennifer [name] MRN: [id number] Date of Birth: 1980-08-12  Today's Date: 02/04/2023 OT Individual Time: 1130-1155 OT Individual Time Calculation (min): 25 min  and Today's Date: 02/04/2023 OT Missed Time: 35 Minutes Missed Time Reason: Pain;Other (comment) (palliative consult, low blood sugar)   Short Term Goals: Week 2:  OT Short Term Goal 1 (Week 2): Pt will complete donning of shrinker with supervision OT Short Term Goal 2 (Week 2): Pt will initiate switching out shrinker/limb dressing 75% of the time without cueing OT Short Term Goal 3 (Week 2): Pt will self-propel 100 ft in w/c on unlevel surfaces with supervision to simulate public environment  Skilled Therapeutic Interventions/Progress Updates:  (Attempt 1) Pt received seated EOB, verbalizing agreement to session however in extreme pain in residual limb; pre-medicated. Discussed with pt options for therapy then palliative arrived for consult. Deferred session til later time to allow pt the opportunity to establish her personal medical goals. Pt remained EOB with NP present for discussion. Pt missed 35 mins of OT intervention secondary to palliative consult, pain, low CBG; OT will make up missed time as able.   (Attempt 2) Skilled OT intervention completed with focus on blood sugar intervention, pain management and repositioning. Pt received seated EOB with limb uncovered, agreeable to OT intervention. Continued excruciating pain reported in Rt residual limb; pre-medicated. OT offered rest breaks and repositioning throughout for pain reduction.   Per NT, pt with low blood sugar of 59 and pt currently eating snack, however upon recheck with further drop to 49. Per nursing, OT retrieved orange juice and administered to pt, however despite that pt with continued drop therefore OT retrieved more orange juice per nursing instruction and administered.   While nursing consulted with MD,  OT offered the following for pain management: -repositioning with min A for Rt limb > supine with Rt limb initially supported under knee to off load base of limb that by report feels like it's going to explode however pt with increase in pain therefore only position of comfort achieved was hanging over EOB -retrieved warm blankets and place around pt with heating pad applied to back for central heat -completed gentle massage referred pain intervention to the Rt quad to distract from base of stump pain  Pt remained upright in bed, with bed alarm on/activated, and with all needs in reach at end of session.   Therapy Documentation Precautions:  Precautions Precautions: Fall Restrictions Weight Bearing Restrictions Per Provider Order: Yes RLE Weight Bearing Per Provider Order: Non weight bearing    Therapy/Group: Individual Therapy  Lorrayne FORBES Fritter, MS, OTR/L  02/04/2023, 12:21 PM

## 2023-02-04 NOTE — Progress Notes (Signed)
 Hypoglycemic Event  CBG: 59  Treatment: 4 oz juice/soda  Symptoms: None  Follow-up CBG: Time:1145 CBG Result:41  Possible Reasons for Event: Inadequate meal intake  Comments/MD notified: Dr. Benjie Karvonen notified

## 2023-02-04 NOTE — Progress Notes (Signed)
 Physical Therapy Discharge Note  Patient Details  Name: Jennifer [name] MRN: [id number] Date of Birth: [birth year] Today's Date: 02/04/2023  Physical Therapy Discharge Note  This patient was unable to complete the inpatient rehab program due to ischemic changes in R residual limb; therefore did not meet their long term goals. Pt left the program at a CGA assist level for their functional mobility/ transfers. This patient is being discharged from PT services at this time.  Pt's perception of pain in the last five days was unable to answer at this time.   See CareTool for functional status details  If the patient is able to return to inpatient rehabilitation within 3 midnights, this may be considered an interrupted stay and therapy services will resume as ordered. Modification and reinstatement of their goals will be made upon completion of therapy service reevaluations.     [name] Therisa Stains PT, DPT 02/04/2023, 2:43 PM

## 2023-02-05 ENCOUNTER — Encounter (HOSPITAL_COMMUNITY): Payer: Self-pay | Admitting: Vascular Surgery

## 2023-02-05 ENCOUNTER — Encounter (HOSPITAL_COMMUNITY)
Admission: EM | Disposition: A | Discharge status: Rehabilitation Facility | Payer: Self-pay | Source: Other Acute Inpatient Hospital | Attending: Internal Medicine

## 2023-02-05 ENCOUNTER — Other Ambulatory Visit: Payer: Self-pay

## 2023-02-05 ENCOUNTER — Inpatient Hospital Stay (HOSPITAL_COMMUNITY): Payer: Medicaid Other | Admitting: Certified Registered Nurse Anesthetist

## 2023-02-05 ENCOUNTER — Inpatient Hospital Stay (HOSPITAL_COMMUNITY)
Admission: EM | Admit: 2023-02-05 | Discharge: 2023-02-12 | DRG: 476 | Disposition: A | Discharge status: Rehabilitation Facility | Payer: Medicaid Other | Source: Other Acute Inpatient Hospital | Attending: Internal Medicine | Admitting: Internal Medicine

## 2023-02-05 DIAGNOSIS — F17210 Nicotine dependence, cigarettes, uncomplicated: Secondary | ICD-10-CM

## 2023-02-05 DIAGNOSIS — E1042 Type 1 diabetes mellitus with diabetic polyneuropathy: Secondary | ICD-10-CM | POA: Diagnosis present

## 2023-02-05 DIAGNOSIS — I10 Essential (primary) hypertension: Secondary | ICD-10-CM | POA: Diagnosis not present

## 2023-02-05 DIAGNOSIS — Z6829 Body mass index (BMI) 29.0-29.9, adult: Secondary | ICD-10-CM

## 2023-02-05 DIAGNOSIS — Z885 Allergy status to narcotic agent status: Secondary | ICD-10-CM

## 2023-02-05 DIAGNOSIS — N921 Excessive and frequent menstruation with irregular cycle: Secondary | ICD-10-CM | POA: Diagnosis not present

## 2023-02-05 DIAGNOSIS — F17200 Nicotine dependence, unspecified, uncomplicated: Secondary | ICD-10-CM | POA: Diagnosis present

## 2023-02-05 DIAGNOSIS — Y835 Amputation of limb(s) as the cause of abnormal reaction of the patient, or of later complication, without mention of misadventure at the time of the procedure: Secondary | ICD-10-CM | POA: Diagnosis present

## 2023-02-05 DIAGNOSIS — G4730 Sleep apnea, unspecified: Secondary | ICD-10-CM

## 2023-02-05 DIAGNOSIS — E669 Obesity, unspecified: Secondary | ICD-10-CM | POA: Diagnosis present

## 2023-02-05 DIAGNOSIS — E875 Hyperkalemia: Secondary | ICD-10-CM | POA: Insufficient documentation

## 2023-02-05 DIAGNOSIS — Z8249 Family history of ischemic heart disease and other diseases of the circulatory system: Secondary | ICD-10-CM

## 2023-02-05 DIAGNOSIS — E1142 Type 2 diabetes mellitus with diabetic polyneuropathy: Secondary | ICD-10-CM | POA: Diagnosis present

## 2023-02-05 DIAGNOSIS — I998 Other disorder of circulatory system: Secondary | ICD-10-CM | POA: Diagnosis not present

## 2023-02-05 DIAGNOSIS — E119 Type 2 diabetes mellitus without complications: Secondary | ICD-10-CM

## 2023-02-05 DIAGNOSIS — Z794 Long term (current) use of insulin: Secondary | ICD-10-CM

## 2023-02-05 DIAGNOSIS — T8789 Other complications of amputation stump: Principal | ICD-10-CM | POA: Diagnosis present

## 2023-02-05 DIAGNOSIS — I480 Paroxysmal atrial fibrillation: Secondary | ICD-10-CM | POA: Diagnosis present

## 2023-02-05 DIAGNOSIS — E1051 Type 1 diabetes mellitus with diabetic peripheral angiopathy without gangrene: Secondary | ICD-10-CM | POA: Diagnosis present

## 2023-02-05 DIAGNOSIS — N920 Excessive and frequent menstruation with regular cycle: Secondary | ICD-10-CM | POA: Diagnosis present

## 2023-02-05 DIAGNOSIS — D259 Leiomyoma of uterus, unspecified: Secondary | ICD-10-CM | POA: Diagnosis present

## 2023-02-05 DIAGNOSIS — G4733 Obstructive sleep apnea (adult) (pediatric): Secondary | ICD-10-CM | POA: Diagnosis present

## 2023-02-05 DIAGNOSIS — Z79899 Other long term (current) drug therapy: Secondary | ICD-10-CM

## 2023-02-05 DIAGNOSIS — I4891 Unspecified atrial fibrillation: Secondary | ICD-10-CM | POA: Diagnosis present

## 2023-02-05 DIAGNOSIS — E785 Hyperlipidemia, unspecified: Secondary | ICD-10-CM | POA: Diagnosis present

## 2023-02-05 DIAGNOSIS — D649 Anemia, unspecified: Secondary | ICD-10-CM | POA: Diagnosis present

## 2023-02-05 DIAGNOSIS — Z833 Family history of diabetes mellitus: Secondary | ICD-10-CM

## 2023-02-05 DIAGNOSIS — S81001A Unspecified open wound, right knee, initial encounter: Secondary | ICD-10-CM

## 2023-02-05 DIAGNOSIS — G546 Phantom limb syndrome with pain: Secondary | ICD-10-CM | POA: Diagnosis not present

## 2023-02-05 DIAGNOSIS — I70221 Atherosclerosis of native arteries of extremities with rest pain, right leg: Secondary | ICD-10-CM | POA: Diagnosis present

## 2023-02-05 DIAGNOSIS — Z886 Allergy status to analgesic agent status: Secondary | ICD-10-CM

## 2023-02-05 HISTORY — PX: AMPUTATION: SHX166

## 2023-02-05 HISTORY — DX: Hyperkalemia: E87.5

## 2023-02-05 LAB — BASIC METABOLIC PANEL
Anion gap: 11 (ref 5–15)
BUN: 21 mg/dL — ABNORMAL HIGH (ref 6–20)
CO2: 20 mmol/L — ABNORMAL LOW (ref 22–32)
Calcium: 9.4 mg/dL (ref 8.9–10.3)
Chloride: 102 mmol/L (ref 98–111)
Creatinine, Ser: 1.56 mg/dL — ABNORMAL HIGH (ref 0.44–1.00)
GFR, Estimated: 42 mL/min — ABNORMAL LOW (ref >60)
Glucose, Bld: 111 mg/dL — ABNORMAL HIGH (ref 70–99)
Potassium: 5 mmol/L (ref 3.5–5.1)
Sodium: 133 mmol/L — ABNORMAL LOW (ref 135–145)

## 2023-02-05 LAB — CBC
HCT: 23.8 % — ABNORMAL LOW (ref 36.0–46.0)
HCT: 24 % — ABNORMAL LOW (ref 36.0–46.0)
Hemoglobin: 7.1 g/dL — ABNORMAL LOW (ref 12.0–15.0)
Hemoglobin: 7.1 g/dL — ABNORMAL LOW (ref 12.0–15.0)
MCH: 22 pg — ABNORMAL LOW (ref 26.0–34.0)
MCH: 21.8 pg — ABNORMAL LOW (ref 26.0–34.0)
MCHC: 29.8 g/dL — ABNORMAL LOW (ref 30.0–36.0)
MCHC: 29.6 g/dL — ABNORMAL LOW (ref 30.0–36.0)
MCV: 73.9 fL — ABNORMAL LOW (ref 80.0–100.0)
MCV: 73.8 fL — ABNORMAL LOW (ref 80.0–100.0)
Platelets: 656 10*3/uL — ABNORMAL HIGH (ref 150–400)
Platelets: 657 10*3/uL — ABNORMAL HIGH (ref 150–400)
RBC: 3.22 MIL/uL — ABNORMAL LOW (ref 3.87–5.11)
RBC: 3.25 MIL/uL — ABNORMAL LOW (ref 3.87–5.11)
RDW: 23.7 % — ABNORMAL HIGH (ref 11.5–15.5)
RDW: 23.6 % — ABNORMAL HIGH (ref 11.5–15.5)
WBC: 13.9 10*3/uL — ABNORMAL HIGH (ref 4.0–10.5)
WBC: 14.2 10*3/uL — ABNORMAL HIGH (ref 4.0–10.5)
nRBC: 0 % (ref 0.0–0.2)
nRBC: 0 % (ref 0.0–0.2)

## 2023-02-05 LAB — GLUCOSE, CAPILLARY
Glucose-Capillary: 112 mg/dL — ABNORMAL HIGH (ref 70–99)
Glucose-Capillary: 97 mg/dL (ref 70–99)
Glucose-Capillary: 109 mg/dL — ABNORMAL HIGH (ref 70–99)
Glucose-Capillary: 183 mg/dL — ABNORMAL HIGH (ref 70–99)

## 2023-02-05 LAB — APTT: aPTT: 42 s — ABNORMAL HIGH (ref 24–36)

## 2023-02-05 LAB — HEPARIN LEVEL (UNFRACTIONATED): Heparin Unfractionated: 0.65 [IU]/mL (ref 0.30–0.70)

## 2023-02-05 SURGERY — AMPUTATION, ABOVE KNEE
Anesthesia: General | Site: Knee | Laterality: Right

## 2023-02-05 MED ORDER — HYDROMORPHONE HCL 2 MG PO TABS
2.0000 mg | ORAL_TABLET | ORAL | Status: DC | PRN
  Administered 2023-02-05 – 2023-02-08 (×11): 2 mg via ORAL
  Filled 2023-02-05 (×11): qty 1

## 2023-02-05 MED ORDER — MAGNESIUM GLUCONATE 500 MG PO TABS
250.0000 mg | ORAL_TABLET | Freq: Every day | ORAL | Status: DC
  Administered 2023-02-05 – 2023-02-11 (×7): 250 mg via ORAL
  Filled 2023-02-05 (×8): qty 1

## 2023-02-05 MED ORDER — INSULIN GLARGINE-YFGN 100 UNIT/ML ~~LOC~~ SOLN: 31.0000 [IU] | Freq: Two times a day (BID) | SUBCUTANEOUS | Status: DC

## 2023-02-05 MED ORDER — HYDROMORPHONE HCL 1 MG/ML IJ SOLN: 0.5000 mg | INTRAMUSCULAR | Status: DC | PRN

## 2023-02-05 MED ORDER — MAGNESIUM GLUCONATE 500 MG PO TABS: 250.0000 mg | ORAL_TABLET | Freq: Every day | ORAL | Status: DC

## 2023-02-05 MED ORDER — DILTIAZEM HCL ER COATED BEADS 180 MG PO CP24: 180.0000 mg | ORAL_CAPSULE | Freq: Every day | ORAL | Status: DC

## 2023-02-05 MED ORDER — ACETAMINOPHEN 10 MG/ML IV SOLN
INTRAVENOUS | Status: AC
  Filled 2023-02-05: qty 100

## 2023-02-05 MED ORDER — TRAZODONE HCL 100 MG PO TABS
100.0000 mg | ORAL_TABLET | Freq: Every day | ORAL | Status: DC
  Administered 2023-02-05 – 2023-02-11 (×7): 100 mg via ORAL
  Filled 2023-02-05 (×8): qty 1

## 2023-02-05 MED ORDER — HYDROMORPHONE HCL 1 MG/ML IJ SOLN
0.2500 mg | INTRAMUSCULAR | Status: DC | PRN
Start: 2023-02-05 — End: 2023-02-05

## 2023-02-05 MED ORDER — POLYETHYLENE GLYCOL 3350 17 G PO PACK: 17.0000 g | PACK | Freq: Every day | ORAL | Status: DC

## 2023-02-05 MED ORDER — CYCLOBENZAPRINE HCL 10 MG PO TABS
10.0000 mg | ORAL_TABLET | Freq: Three times a day (TID) | ORAL | Status: DC | PRN
  Administered 2023-02-05 – 2023-02-11 (×6): 10 mg via ORAL
  Filled 2023-02-05 (×7): qty 1

## 2023-02-05 MED ORDER — GABAPENTIN 400 MG PO CAPS: 400.0000 mg | ORAL_CAPSULE | Freq: Four times a day (QID) | ORAL | Status: DC

## 2023-02-05 MED ORDER — SUGAMMADEX SODIUM 200 MG/2ML IV SOLN
INTRAVENOUS | Status: DC | PRN
  Administered 2023-02-05: 200 mg via INTRAVENOUS

## 2023-02-05 MED ORDER — 0.9 % SODIUM CHLORIDE (POUR BTL) OPTIME
TOPICAL | Status: DC | PRN
  Administered 2023-02-05: 1000 mL

## 2023-02-05 MED ORDER — DROPERIDOL 2.5 MG/ML IJ SOLN: 0.6250 mg | Freq: Once | INTRAMUSCULAR | Status: DC | PRN

## 2023-02-05 MED ORDER — ONDANSETRON HCL 4 MG/2ML IJ SOLN
INTRAMUSCULAR | Status: AC
  Filled 2023-02-05: qty 2

## 2023-02-05 MED ORDER — TRAMADOL HCL 50 MG PO TABS: 50.0000 mg | ORAL_TABLET | Freq: Four times a day (QID) | ORAL | Status: DC | PRN

## 2023-02-05 MED ORDER — SENNA 8.6 MG PO TABS: 2.0000 | ORAL_TABLET | Freq: Every day | ORAL | Status: DC

## 2023-02-05 MED ORDER — HYDROMORPHONE HCL 1 MG/ML IJ SOLN
INTRAMUSCULAR | Status: AC
  Administered 2023-02-05: 0.5 mg via INTRAVENOUS
  Filled 2023-02-05: qty 1

## 2023-02-05 MED ORDER — LIDOCAINE 5 % EX PTCH: 1.0000 | MEDICATED_PATCH | CUTANEOUS | Status: DC

## 2023-02-05 MED ORDER — DEXAMETHASONE SODIUM PHOSPHATE 10 MG/ML IJ SOLN
INTRAMUSCULAR | Status: AC
  Filled 2023-02-05: qty 1

## 2023-02-05 MED ORDER — HYDROMORPHONE HCL 1 MG/ML IJ SOLN
INTRAMUSCULAR | Status: DC | PRN
  Administered 2023-02-05: .5 mg via INTRAVENOUS

## 2023-02-05 MED ORDER — DEXAMETHASONE SODIUM PHOSPHATE 10 MG/ML IJ SOLN
INTRAMUSCULAR | Status: DC | PRN
  Administered 2023-02-05: 5 mg via INTRAVENOUS

## 2023-02-05 MED ORDER — FENTANYL CITRATE (PF) 100 MCG/2ML IJ SOLN
INTRAMUSCULAR | Status: AC
  Filled 2023-02-05: qty 2

## 2023-02-05 MED ORDER — ROCURONIUM BROMIDE 10 MG/ML (PF) SYRINGE
PREFILLED_SYRINGE | INTRAVENOUS | Status: DC | PRN
  Administered 2023-02-05: 50 mg via INTRAVENOUS

## 2023-02-05 MED ORDER — GABAPENTIN 400 MG PO CAPS
400.0000 mg | ORAL_CAPSULE | Freq: Four times a day (QID) | ORAL | Status: DC
  Administered 2023-02-05 – 2023-02-12 (×28): 400 mg via ORAL
  Filled 2023-02-05 (×28): qty 1

## 2023-02-05 MED ORDER — CEFAZOLIN SODIUM 1 G IJ SOLR
INTRAMUSCULAR | Status: AC
  Filled 2023-02-05: qty 20

## 2023-02-05 MED ORDER — POLYETHYLENE GLYCOL 3350 17 G PO PACK
17.0000 g | PACK | Freq: Every day | ORAL | Status: DC
  Administered 2023-02-06 – 2023-02-10 (×5): 17 g via ORAL
  Filled 2023-02-05 (×7): qty 1

## 2023-02-05 MED ORDER — ACETAMINOPHEN 325 MG PO TABS
325.0000 mg | ORAL_TABLET | Freq: Once | ORAL | Status: DC | PRN
Start: 2023-02-05 — End: 2023-02-05

## 2023-02-05 MED ORDER — ACETAMINOPHEN 500 MG PO TABS: 1000.0000 mg | ORAL_TABLET | Freq: Four times a day (QID) | ORAL | Status: DC

## 2023-02-05 MED ORDER — HYDROMORPHONE HCL 1 MG/ML IJ SOLN
0.2500 mg | INTRAMUSCULAR | Status: DC | PRN
  Administered 2023-02-05: 0.5 mg via INTRAVENOUS

## 2023-02-05 MED ORDER — CYCLOBENZAPRINE HCL 10 MG PO TABS: 10.0000 mg | ORAL_TABLET | Freq: Three times a day (TID) | ORAL | Status: DC

## 2023-02-05 MED ORDER — ACETAMINOPHEN 160 MG/5ML PO SOLN
325.0000 mg | Freq: Once | ORAL | Status: DC | PRN
Start: 2023-02-05 — End: 2023-02-05

## 2023-02-05 MED ORDER — BACLOFEN 5 MG PO TABS: 5.0000 mg | ORAL_TABLET | Freq: Three times a day (TID) | ORAL | Status: DC | PRN

## 2023-02-05 MED ORDER — SENNA 8.6 MG PO TABS
2.0000 | ORAL_TABLET | Freq: Every day | ORAL | Status: DC
Start: 2023-02-05 — End: 2023-02-12
  Administered 2023-02-05 – 2023-02-10 (×6): 17.2 mg via ORAL
  Filled 2023-02-05 (×7): qty 2

## 2023-02-05 MED ORDER — PROPOFOL 10 MG/ML IV BOLUS
INTRAVENOUS | Status: AC
  Filled 2023-02-05: qty 20

## 2023-02-05 MED ORDER — MORPHINE SULFATE ER 15 MG PO TBCR
30.0000 mg | EXTENDED_RELEASE_TABLET | Freq: Two times a day (BID) | ORAL | Status: DC
  Administered 2023-02-05 – 2023-02-12 (×14): 30 mg via ORAL
  Filled 2023-02-05 (×14): qty 2

## 2023-02-05 MED ORDER — MEPERIDINE HCL 25 MG/ML IJ SOLN: 6.2500 mg | INTRAMUSCULAR | Status: DC | PRN

## 2023-02-05 MED ORDER — TRAZODONE HCL 100 MG PO TABS: 100.0000 mg | ORAL_TABLET | Freq: Every day | ORAL | Status: DC

## 2023-02-05 MED ORDER — MIDAZOLAM HCL 2 MG/2ML IJ SOLN
INTRAMUSCULAR | Status: AC
  Filled 2023-02-05: qty 2

## 2023-02-05 MED ORDER — LACTATED RINGERS IV SOLN: INTRAVENOUS | Status: DC

## 2023-02-05 MED ORDER — LIDOCAINE HCL (CARDIAC) PF 100 MG/5ML IV SOSY
PREFILLED_SYRINGE | INTRAVENOUS | Status: DC | PRN
  Administered 2023-02-05: 60 mg via INTRATRACHEAL

## 2023-02-05 MED ORDER — FENTANYL CITRATE (PF) 250 MCG/5ML IJ SOLN
INTRAMUSCULAR | Status: DC | PRN
  Administered 2023-02-05 (×2): 100 ug via INTRAVENOUS
  Administered 2023-02-05: 50 ug via INTRAVENOUS

## 2023-02-05 MED ORDER — FENTANYL CITRATE (PF) 250 MCG/5ML IJ SOLN
INTRAMUSCULAR | Status: AC
  Filled 2023-02-05: qty 5

## 2023-02-05 MED ORDER — SODIUM CHLORIDE 0.9% FLUSH
3.0000 mL | Freq: Two times a day (BID) | INTRAVENOUS | Status: DC
  Administered 2023-02-05 – 2023-02-12 (×14): 3 mL via INTRAVENOUS

## 2023-02-05 MED ORDER — ACETAMINOPHEN 10 MG/ML IV SOLN
1000.0000 mg | Freq: Once | INTRAVENOUS | Status: DC | PRN
  Administered 2023-02-05: 1000 mg via INTRAVENOUS

## 2023-02-05 MED ORDER — PANTOPRAZOLE SODIUM 40 MG PO TBEC
40.0000 mg | DELAYED_RELEASE_TABLET | Freq: Every day | ORAL | Status: DC
Start: 2023-02-06 — End: 2023-02-12
  Administered 2023-02-06 – 2023-02-12 (×7): 40 mg via ORAL
  Filled 2023-02-05 (×7): qty 1

## 2023-02-05 MED ORDER — CHLORHEXIDINE GLUCONATE 0.12 % MT SOLN
15.0000 mL | Freq: Once | OROMUCOSAL | Status: AC
  Administered 2023-02-05: 15 mL via OROMUCOSAL
  Filled 2023-02-05: qty 15

## 2023-02-05 MED ORDER — ORAL CARE MOUTH RINSE: 15.0000 mL | Freq: Once | OROMUCOSAL | Status: AC

## 2023-02-05 MED ORDER — MORPHINE SULFATE ER 15 MG PO TBCR: 45.0000 mg | EXTENDED_RELEASE_TABLET | Freq: Two times a day (BID) | ORAL | Status: DC

## 2023-02-05 MED ORDER — INSULIN GLARGINE-YFGN 100 UNIT/ML ~~LOC~~ SOLN
20.0000 [IU] | Freq: Two times a day (BID) | SUBCUTANEOUS | Status: DC
  Administered 2023-02-05 – 2023-02-12 (×14): 20 [IU] via SUBCUTANEOUS
  Filled 2023-02-05 (×15): qty 0.2

## 2023-02-05 MED ORDER — POLYSACCHARIDE IRON COMPLEX 150 MG PO CAPS
150.0000 mg | ORAL_CAPSULE | Freq: Two times a day (BID) | ORAL | Status: DC
  Administered 2023-02-05 – 2023-02-12 (×14): 150 mg via ORAL
  Filled 2023-02-05 (×15): qty 1

## 2023-02-05 MED ORDER — PROPOFOL 10 MG/ML IV BOLUS
INTRAVENOUS | Status: DC | PRN
  Administered 2023-02-05: 110 mg via INTRAVENOUS

## 2023-02-05 MED ORDER — MIDAZOLAM HCL 2 MG/2ML IJ SOLN
INTRAMUSCULAR | Status: DC | PRN
  Administered 2023-02-05: 2 mg via INTRAVENOUS

## 2023-02-05 MED ORDER — LABETALOL HCL 5 MG/ML IV SOLN
INTRAVENOUS | Status: AC
  Filled 2023-02-05: qty 4

## 2023-02-05 MED ORDER — HYDROMORPHONE HCL 1 MG/ML IJ SOLN
0.5000 mg | INTRAMUSCULAR | Status: DC | PRN
  Administered 2023-02-05 – 2023-02-06 (×2): 0.5 mg via INTRAVENOUS
  Filled 2023-02-05 (×4): qty 0.5

## 2023-02-05 MED ORDER — LABETALOL HCL 5 MG/ML IV SOLN
INTRAVENOUS | Status: DC | PRN
  Administered 2023-02-05 (×2): 5 mg via INTRAVENOUS

## 2023-02-05 MED ORDER — OXYCODONE-ACETAMINOPHEN 5-325 MG PO TABS: 1.0000 | ORAL_TABLET | ORAL | Status: DC | PRN

## 2023-02-05 MED ORDER — HYDROMORPHONE HCL 1 MG/ML IJ SOLN
INTRAMUSCULAR | Status: AC
  Filled 2023-02-05: qty 0.5

## 2023-02-05 MED ORDER — POLYSACCHARIDE IRON COMPLEX 150 MG PO CAPS: 150.0000 mg | ORAL_CAPSULE | Freq: Two times a day (BID) | ORAL | Status: DC

## 2023-02-05 MED ORDER — PANTOPRAZOLE SODIUM 40 MG PO TBEC: 40.0000 mg | DELAYED_RELEASE_TABLET | Freq: Every day | ORAL | Status: DC

## 2023-02-05 MED ORDER — DILTIAZEM HCL ER COATED BEADS 180 MG PO CP24
180.0000 mg | ORAL_CAPSULE | Freq: Every day | ORAL | Status: DC
  Administered 2023-02-06 – 2023-02-12 (×7): 180 mg via ORAL
  Filled 2023-02-05 (×7): qty 1

## 2023-02-05 MED ORDER — ACETAMINOPHEN 650 MG RE SUPP: 650.0000 mg | Freq: Four times a day (QID) | RECTAL | Status: DC | PRN

## 2023-02-05 MED ORDER — INSULIN ASPART 100 UNIT/ML IJ SOLN: 0.0000 [IU] | Freq: Three times a day (TID) | INTRAMUSCULAR | Status: DC

## 2023-02-05 MED ORDER — ONDANSETRON HCL 4 MG/2ML IJ SOLN
INTRAMUSCULAR | Status: DC | PRN
  Administered 2023-02-05: 4 mg via INTRAVENOUS

## 2023-02-05 MED ORDER — ACETAMINOPHEN 325 MG PO TABS
650.0000 mg | ORAL_TABLET | Freq: Four times a day (QID) | ORAL | Status: DC | PRN
  Administered 2023-02-12: 650 mg via ORAL
  Filled 2023-02-05: qty 2

## 2023-02-05 MED ORDER — TRAMADOL HCL 50 MG PO TABS
50.0000 mg | ORAL_TABLET | Freq: Four times a day (QID) | ORAL | Status: DC | PRN
  Administered 2023-02-06 – 2023-02-09 (×5): 50 mg via ORAL
  Filled 2023-02-05 (×5): qty 1

## 2023-02-05 MED ORDER — HYDROMORPHONE HCL 2 MG PO TABS: 2.0000 mg | ORAL_TABLET | ORAL | Status: DC | PRN

## 2023-02-05 SURGICAL SUPPLY — 56 items
BAG COUNTER SPONGE SURGICOUNT (BAG) ×2 IMPLANT
BANDAGE ESMARK 6X9 LF (GAUZE/BANDAGES/DRESSINGS) IMPLANT
BLADE SAGITTAL 25.0X1.19X90 (BLADE) ×2 IMPLANT
BLADE SAW THK.89X75X18XSGTL (BLADE) IMPLANT
BLADE SURG 21 STRL SS (BLADE) ×2 IMPLANT
BNDG COHESIVE 6X5 TAN ST LF (GAUZE/BANDAGES/DRESSINGS) ×2 IMPLANT
BNDG ELASTIC 4X5.8 VLCR STR LF (GAUZE/BANDAGES/DRESSINGS) ×2 IMPLANT
BNDG ELASTIC 6X5.8 VLCR STR LF (GAUZE/BANDAGES/DRESSINGS) ×2 IMPLANT
BNDG ESMARK 6X9 LF (GAUZE/BANDAGES/DRESSINGS)
BNDG GAUZE DERMACEA FLUFF 4 (GAUZE/BANDAGES/DRESSINGS) ×2 IMPLANT
BUR DISC 0.8X25 (BURR) IMPLANT
CANISTER SUCT 3000ML PPV (MISCELLANEOUS) ×2 IMPLANT
CANISTER WOUND CARE 500ML ATS (WOUND CARE) ×2 IMPLANT
CANISTER WOUNDNEG PRESSURE 500 (CANNISTER) IMPLANT
CHLORAPREP W/TINT 26 (MISCELLANEOUS) ×2 IMPLANT
COVER SURGICAL LIGHT HANDLE (MISCELLANEOUS) ×2 IMPLANT
CUFF TRNQT CYL 24X4X16.5-23 (TOURNIQUET CUFF) IMPLANT
CUFF TRNQT CYL 34X4.125X (TOURNIQUET CUFF) IMPLANT
DRAIN CHANNEL 19F RND (DRAIN) IMPLANT
DRAPE DERMATAC (DRAPES) IMPLANT
DRAPE HALF SHEET 40X57 (DRAPES) ×2 IMPLANT
DRAPE INCISE IOBAN 66X45 STRL (DRAPES) IMPLANT
DRAPE SURG ORHT 6 SPLT 77X108 (DRAPES) ×4 IMPLANT
DRESSING PREVENA PLUS CUSTOM (GAUZE/BANDAGES/DRESSINGS) IMPLANT
DRSG PREVENA PLUS CUSTOM (GAUZE/BANDAGES/DRESSINGS) ×1
ELECT CAUTERY BLADE 6.4 (BLADE) ×2 IMPLANT
ELECT REM PT RETURN 9FT ADLT (ELECTROSURGICAL) ×1
ELECTRODE REM PT RTRN 9FT ADLT (ELECTROSURGICAL) ×2 IMPLANT
EVACUATOR SILICONE 100CC (DRAIN) IMPLANT
GAUZE SPONGE 4X4 12PLY STRL (GAUZE/BANDAGES/DRESSINGS) ×2 IMPLANT
GAUZE XEROFORM 5X9 LF (GAUZE/BANDAGES/DRESSINGS) IMPLANT
GLOVE BIO SURGEON STRL SZ8 (GLOVE) ×2 IMPLANT
GOWN STRL REUS W/ TWL LRG LVL3 (GOWN DISPOSABLE) ×4 IMPLANT
GOWN STRL REUS W/ TWL XL LVL3 (GOWN DISPOSABLE) ×2 IMPLANT
KIT BASIN OR (CUSTOM PROCEDURE TRAY) ×2 IMPLANT
KIT PREVENA INCISION MGT20CM45 (CANNISTER) IMPLANT
KIT TURNOVER KIT B (KITS) ×2 IMPLANT
NS IRRIG 1000ML POUR BTL (IV SOLUTION) ×2 IMPLANT
PACK GENERAL/GYN (CUSTOM PROCEDURE TRAY) ×2 IMPLANT
PAD ARMBOARD 7.5X6 YLW CONV (MISCELLANEOUS) ×4 IMPLANT
PENCIL SMOKE EVACUATOR (MISCELLANEOUS) ×2 IMPLANT
PREVENA RESTOR ARTHOFORM 46X30 (CANNISTER) IMPLANT
RASP HELIOCORDIAL MED (MISCELLANEOUS) IMPLANT
STAPLER SKIN 35 REG (STAPLE) ×2 IMPLANT
STAPLER VISISTAT 35W (STAPLE) ×2 IMPLANT
STOCKINETTE IMPERVIOUS LG (DRAPES) ×2 IMPLANT
SUT ETHILON 2 0 PSLX (SUTURE) ×4 IMPLANT
SUT ETHILON 3 0 PS 1 (SUTURE) IMPLANT
SUT SILK 0 TIES 10X30 (SUTURE) ×2 IMPLANT
SUT SILK 2 0 SH CR/8 (SUTURE) ×2 IMPLANT
SUT VIC AB 2-0 CT1 18 (SUTURE) ×6 IMPLANT
SUT VIC AB 2-0 CT1 TAPERPNT 27 (SUTURE) IMPLANT
TAPE UMBILICAL 1/8X30 (MISCELLANEOUS) IMPLANT
TOWEL GREEN STERILE (TOWEL DISPOSABLE) ×4 IMPLANT
UNDERPAD 30X36 HEAVY ABSORB (UNDERPADS AND DIAPERS) ×2 IMPLANT
WATER STERILE IRR 1000ML POUR (IV SOLUTION) ×2 IMPLANT

## 2023-02-05 NOTE — Progress Notes (Signed)
 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    High Risk Drug Classes Is patient taking? Indication by Medication  Antipsychotic No   Anticoagulant No   Antibiotic No    Opioid Yes Morphine  CR, Dilaudid  , tramadol - pain  Antiplatelet No    Hypoglycemics/insulin  Yes Insulin  (70/30, aspart, and glargine-yfgn)- DM  Vasoactive Medication Yes Diltiazem  - Afib/Aflutter  Chemotherapy No    Other Yes Acetaminophen -pain  Baclofen , cyclobenzaprine - muscle spasms Depo-Provera inj- hx of fibroids and menorrhagia/ birth control Gabapentin , lidocaine  patch- pain Magnesium  gluconate- supplement/BP control Nicotine  - smoke cessation Niferex-iron  supplement  Miralax , senokot - constipation Pantoprazole - GERD Trazodone  - sleep        Type of Medication Issue Identified Description of Issue Recommendation(s)  Drug Interaction(s) (clinically significant)        Duplicate Therapy        Allergy        No Medication Administration End Date        Incorrect Dose        Additional Drug Therapy Needed        Significant med changes from prior encounter (inform family/care partners about these prior to discharge).  Apixaban  (new) for PAF was transitioned to IV heparin  infusion due to need for surgery.  Heparin  drip was held 02/05/23 AM prior to scheduled surgery (right AKA) .   Resume IV heparin  or apixaban  when appropriate post op.   Other    PTA meds: patient was not taking atorvastatin , diltiazem , hydralazine , lisinopril , metformin   thus these were removed from PTA meds list on 01/28/23.  Note: Diltiazem  was resumed on 01/21/23 during previous [facility] acute admission and continued on CIR admission.       Clinically significant medication issues were identified that warrant  physician communication and completion of prescribed/recommended actions by midnight of the next day:  No   Name of provider notified for urgent issues identified:    Provider Method of Notification:       Pharmacist comments:  Pharmacist will follow up post op for anticoagulation plan (IV heparin  vs. Apixaban ) after right AKA.   Note that Depo-Provera injection (every 3 months) is not a formulary medication as [facility].      Time spent performing this drug regimen review (minutes):   30   Levorn Gaskins, [place] Clinical Pharmacist 02/05/2023 10:57 AM

## 2023-02-05 NOTE — Progress Notes (Signed)
 Inpatient Rehabilitation Care Coordinator Discharge Note   Patient Details  Name: Jennifer [name] MRN: [id number] Date of Birth: May 20, 1980   Discharge location: D/c to acute for OR- R BKA  Length of Stay: 12 days  Discharge activity level: CGA Asst  Home/community participation: Limited  Patient response un:Yzjouy Literacy - How often do you need to have someone help you when you read instructions, pamphlets, or other written material from your doctor or pharmacy?: Never  Patient response un:Dnrpjo Isolation - How often do you feel lonely or isolated from those around you?: Patient unable to respond  Services provided included: PT, RD, MD, OT, RN, CM, Pharmacy, Neuropsych, SW, TR  Financial Services:  Field Seismologist Utilized: [employer] [place] Medicaid [employer]  Choices offered to/list presented to: N/A  Follow-up services arranged:  DME, Other (Comment) (therapies deferred until prosthesis)      DME : [employer] for TTB, w/c with R ELR, shower stool, and RW- will need to call [employer] for copay for DME    Patient response to transportation need: Is the patient able to respond to transportation needs?: Yes In the past 12 months, has lack of transportation kept you from medical appointments or from getting medications?: No In the past 12 months, has lack of transportation kept you from meetings, work, or from getting things needed for daily living?: No   Patient/Family verbalized understanding of follow-up arrangements:  Yes  Individual responsible for coordination of the follow-up plan: contact pt [phone]  Confirmed correct DME delivered: Graeme DELENA Jude 02/05/2023    Comments (or additional information):  Summary of Stay    Date/Time Discharge Planning CSW  02/04/23 0834 Discharging home with spouse and daughter, 78. Spouse works until 530P. 1 level home, 5 steps. [employer] for DME- w/c with R amuptee ped; TTB,  shower stool, and RW.  Therapies deferred until prosthesis. AAC  01/26/23 1556 Discharging home with spouse and daughter, 68. Spouse works until 530P. 1 level home, 5 steps. CJB       Neelam Tiggs A Jude

## 2023-02-05 NOTE — Progress Notes (Addendum)
 PROGRESS NOTE   Subjective/Complaints:  Sitting at bedside.  She has AKA scheduled later today.  She continues to have increased pain and swelling in her residual limb.  ROS: +pain in residual limb and phantom limb pain-continued,  +urinary frequency- improved Denies chest pain, shortness of breath, abdominal pain, nausea, vomiting  Objective:   No results found.  Recent Labs    02/03/23 0511 02/04/23 0541  WBC 9.2 9.9  HGB 8.4* 8.2*  HCT 28.9* 27.9*  PLT 721* 755*   Recent Labs    02/03/23 0511 02/05/23 0638  NA 134* 133*  K 5.0 5.0  CL 101 102  CO2 20* 20*  GLUCOSE 96 111*  BUN 25* 21*  CREATININE 1.75* 1.56*  CALCIUM  9.4 9.4    Intake/Output Summary (Last 24 hours) at 02/05/2023 1347 Last data filed at 02/04/2023 1750 Gross per 24 hour  Intake 477 ml  Output --  Net 477 ml         Physical Exam: Vital Signs Blood pressure 123/75, pulse 84, temperature 98.6 F (37 C), resp. rate 17, height 5' 7 (1.702 m), weight 84.5 kg, SpO2 100%.   General: No acute distress, awake and alert, going to gym with therapy . Appears less uncomfortable today Mood and affect , cooperative but flat CV: RRR no m/r/g appreciated Pulm: CTAB no w/r/r appreciated Abd: soft, NTND, +BS throughout Extremities: R BKA with swelling, bruising.  Distal residual limb cool to touch  Skin: +ischemic changes   Assessment/Plan: 1. Functional deficits which require 3+ hours per day of interdisciplinary therapy in a comprehensive inpatient rehab setting. Physiatrist is providing close team supervision and 24 hour management of active medical problems listed below. Physiatrist and rehab team continue to assess barriers to discharge/monitor patient progress toward functional and medical goals  Care Tool:  Bathing    Body parts bathed by patient: Right arm, Left arm, Chest, Abdomen, Front perineal area, Buttocks, Right upper leg,  Left upper leg, Face   Body parts bathed by helper: Left lower leg Body parts n/a: Right lower leg   Bathing assist Assist Level: Minimal Assistance - Patient > 75%     Upper Body Dressing/Undressing Upper body dressing   What is the patient wearing?: Pull over shirt    Upper body assist Assist Level: Independent    Lower Body Dressing/Undressing Lower body dressing      What is the patient wearing?: Pants, Underwear/pull up     Lower body assist Assist for lower body dressing: Minimal Assistance - Patient > 75%     Toileting Toileting    Toileting assist Assist for toileting: Contact Guard/Touching assist     Transfers Chair/bed transfer  Transfers assist     Chair/bed transfer assist level: Supervision/Verbal cueing     Locomotion Ambulation   Ambulation assist   Ambulation activity did not occur: Safety/medical concerns (pain)  Assist level: Contact Guard/Touching assist Assistive device: Walker-rolling Max distance: 20'   Walk 10 feet activity   Assist  Walk 10 feet activity did not occur: Safety/medical concerns (pain)  Assist level: Contact Guard/Touching assist Assistive device: Walker-rolling   Walk 50 feet activity   Assist Walk 50  feet with 2 turns activity did not occur: Safety/medical concerns (pain)         Walk 150 feet activity   Assist Walk 150 feet activity did not occur: Safety/medical concerns (pain)         Walk 10 feet on uneven surface  activity   Assist Walk 10 feet on uneven surfaces activity did not occur: Safety/medical concerns (pain)         Wheelchair     Assist Is the patient using a wheelchair?: Yes Type of Wheelchair: Manual    Wheelchair assist level: Independent Max wheelchair distance: 363ft    Wheelchair 50 feet with 2 turns activity    Assist        Assist Level: Independent   Wheelchair 150 feet activity     Assist      Assist Level: Independent   Blood  pressure 123/75, pulse 84, temperature 98.6 F (37 C), resp. rate 17, height 5' 7 (1.702 m), weight 84.5 kg, SpO2 100%.  Medical Problem List and Plan: 1. Functional deficits secondary to R BKA PAD             -patient may shower if RLE covered             -ELOS/Goals: 9-12 days, PT/OT mod I, goal 02/03/23             Grounds pass ordered  Limb guard ordered  -AKA scheduled for today 2.  Antithrombotics: switched from eliquis  to heparin              -antiplatelet therapy: N/A 3. Pain Management: decrease dilaudid  to 2mg  and gabapentin  to 400mg  given drowsiness. Long acting morphine  added --On robaxin  1000 mg TID does not seen to help will use  change to  flexeril  10mg  TID-12/22--increase gabapentin  to 800mg  TID Given GFR 48 will dose 800mg  q 12 h Has not taken hydrocodone  q6h,3 times yesterday , discussed she may take another time per day DIscussed with OT, may benefit from TENS unit, has PT next  Add lidoderm  patch right post knee -01/31/23 pain manageable overall, cont regimen, but advised pt to elevate the stump to help with swelling; unclear if she should be wearing her shrinker? -12/30 discussed with pharmacy.  Will increase MS Contin  to 30 mg, stop scheduled hydrocodone  -1/1 Increase MS contin  to 45mg  Q12h -1/2 continue current regimen, hopefully pain will improve overall after her surgery  4. Mood/Behavior/Sleep:  LCSW to follow for evaluation and support.              -antipsychotic agents: N/A 5. Neuropsych/cognition: This patient is capable of making decisions on her own behalf. 6. Skin/Wound Care: Routine pressure relief measures.  7. Fluids/Electrolytes/Nutrition: Monitor I/O. Monitor labs  -01/31/23 BMP fairly stable, hyponatremia as below, Cr stable 1.78  1/2 creatinine improved to 1.56 8.  H/o A flutter/PAF: Monitor HR TID--continue Cardizem  and Apixaban              --monitor for symptoms with increase in activity.   -1/2 Discussed with vascular yesterday, heparin  held  starting 6am for surgery 9. T1DM: Hgb A1c-9.2                         --Continue Insulin  glargline bid--reports compliance at home.   D/c ISS, decreased CBG checks to AC/HS  Decrease novolog  to 10U BID  Controlled   -12/31 Hypoglycemia yesterday, meal coverage  decreased to 4 units, decrease lantus  to 36u BID  1/1  decrease lantus  to 31u BID  1/1 addendum, hypoglycemia, stop mealtime insulin , start correctional  1/2 monitor response to medication change CBG (last 3)  Recent Labs    02/04/23 2139 02/05/23 0621 02/05/23 1118  GLUCAP 128* 112* 97    10. Hyponatremia: Improved from 127 @ admission to 134             --Monitor for now  -01/31/23 up to 132, stable, cont monitoring  1/2 sodium overall stable at 133 today 11. RXI6j?:  Per chart review SCr-1.56 on 08/24 and  1.33 @ admission.              --Monitor renal status with serial checks. Avoid nephrotoxic medications. Encouraging oral hydration.   -01/31/23 Cr 1.78 but stable from yesterday, monitor -12/31 creatinine 1.75, will check PVR x2, Cr  stable from last few readings but a little higher then prior, will start IVF for 1 day,  -1/2 creatinine a little improved to 1.56 12. 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    --hx of menorrhagia/Fibroids-->on depo with good results PTA but has had recurrance w/clots past 3 days.  Hgb reviewed and improved  Transfuse if <7.0- recheck in am , no dizziness with position changes, recheck in am   -01/31/23 Hgb 8.3 today, stable, monitor -02/01/23 Hgb 8.4 today, doing great; vaginal bleeding subsiding too -1/1 Stable HGB 8.2    Latest Ref Rng & Units 02/04/2023    5:41 AM 02/03/2023    5:11 AM 02/02/2023    5:14 AM  CBC  WBC 4.0 - 10.5 K/uL 9.9  9.2  11.3   Hemoglobin 12.0 - 15.0 g/dL 8.2  8.4  8.4   Hematocrit 36.0 - 46.0 % 27.9  28.9  28.3   Platelets  150 - 400 K/uL 755  721  712     14. Obesity. BMI 34 on admission. Dietary counseling 15. Constipation. Miralax , senokot. Changed senna to 2 tabs HS, milk of magnesia ordered 12/25  -02/02/23 LBM yesterday; cont regimen   16. Hyperkalemia. Monitor labs, lokelma  ordered 12/27, repeat labs in AM  -01/31/23 K 4.8 today, monitor with routine labs  -12/31 stable at 5.0, consider low K diet if increases further  1/2 remains overall stable at 5.0    Latest Ref Rng & Units 02/05/2023    6:38 AM 02/03/2023    5:11 AM 02/02/2023    5:14 AM  BMP  Glucose 70 - 99 mg/dL 888  96  99   BUN 6 - 20 mg/dL 21  25  23    Creatinine 0.44 - 1.00 mg/dL 8.43  8.24  8.25   Sodium 135 - 145 mmol/L 133  134  134   Potassium 3.5 - 5.1 mmol/L 5.0  5.0  5.0   Chloride 98 - 111 mmol/L 102  101  100   CO2 22 - 32 mmol/L 20  20  22    Calcium  8.9 - 10.3 mg/dL 9.4  9.4  9.5      17. HTN. Lisinopril  and hydrochlorothiazide  have been stopped. BP stable, continue to hold, magnesium  supplement started HS  -1/2 BP controlled, continue to monitor    Vitals:   02/01/23 1315 02/01/23 2001 02/02/23 0302 02/02/23 1434  BP: 110/66 116/67 120/70 129/75   02/02/23 1926 02/03/23 0409 02/03/23 1405 02/03/23 1935  BP: 114/85 127/76 122/78 118/85   02/04/23 0450  02/04/23 1331 02/04/23 2000 02/05/23 0516  BP: 112/79 123/73 119/76 123/75     18. Blistering at incision site: Vibra  scheduled, d/c'd 12/25  19. Leukocytosis: Vibra  scheduled, reviewed and improved, d/c'd 12/25  20. 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 Active smoker: denies desire to smoke, nicotine  patch d/ced  22. Vaginal bleeding: hx of fibroids and menorrhagia, seems to be slowing as of 02/01/23; monitor H&H with heparin  use.      LOS: 13 days A FACE TO FACE EVALUATION WAS PERFORMED  Murray Collier 02/05/2023, 1:47 PM

## 2023-02-05 NOTE — Anesthesia Preprocedure Evaluation (Addendum)
 Anesthesia Evaluation  Patient identified by MRN, date of birth, ID band Patient awake    Reviewed: Allergy & Precautions, NPO status , Patient's Chart, lab work & pertinent test results  Airway Mallampati: III  TM Distance: >3 FB Neck ROM: Full    Dental  (+) Teeth Intact, Dental Advisory Given   Pulmonary sleep apnea , Current Smoker and Patient abstained from smoking.   breath sounds clear to auscultation       Cardiovascular hypertension, + Peripheral Vascular Disease  + dysrhythmias Atrial Fibrillation  Rhythm:Regular Rate:Normal  Echo  1. Left ventricular ejection fraction, by estimation, is 65 to 70%. The left ventricle has normal function. The left ventricle has no regional wall motion abnormalities. There is mild left ventricular hypertrophy. Left ventricular diastolic parameters are consistent with Grade I diastolic dysfunction (impaired relaxation).   2. Right ventricular systolic function is normal. The right ventricular size is normal. There is normal pulmonary artery systolic pressure.   3. The mitral valve is normal in structure. No evidence of mitral valve regurgitation.   4. The aortic valve is tricuspid. Aortic valve regurgitation is not visualized.   5. The inferior vena cava is normal in size with greater than 50% respiratory variability, suggesting right atrial pressure of 3 mmHg.      Neuro/Psych  Headaches  Neuromuscular disease  negative psych ROS   GI/Hepatic negative GI ROS, Neg liver ROS,,,  Endo/Other  diabetes, Type 2, Insulin  Dependent, Oral Hypoglycemic Agents    Renal/GU Renal disease     Musculoskeletal negative musculoskeletal ROS (+)    Abdominal   Peds  Hematology  (+) Blood dyscrasia, anemia   Anesthesia Other Findings   Reproductive/Obstetrics                              Anesthesia Physical Anesthesia Plan  ASA: 3  Anesthesia Plan: General    Post-op Pain Management: Ofirmev  IV (intra-op)*   Induction: Intravenous  PONV Risk Score and Plan: 3 and Ondansetron , Dexamethasone  and Midazolam   Airway Management Planned: Oral ETT  Additional Equipment: None  Intra-op Plan:   Post-operative Plan: Extubation in OR  Informed Consent: I have reviewed the patients History and Physical, chart, labs and discussed the procedure including the risks, benefits and alternatives for the proposed anesthesia with the patient or authorized representative who has indicated his/her understanding and acceptance.     Dental advisory given  Plan Discussed with: CRNA  Anesthesia Plan Comments:        Anesthesia Quick Evaluation

## 2023-02-05 NOTE — Plan of Care (Signed)
 Discharge to acute for OR; non healing incision post BKA.

## 2023-02-05 NOTE — H&P (Signed)
  Progress Note    02/05/2023 12:11 PM Hospital Day 13  Subjective:  continues to have increasing pain and swelling  Afebrile   Vitals:   02/05/23 1209  BP: 137/83  Pulse: 93  Resp: 18  Temp: 99.2 F (37.3 C)  SpO2: 96%    Physical Exam: General:  sleeping, wakes easily Lungs:  non labored Extremities:  ischemic right BKA  CBC    Component Value Date/Time   WBC 9.9 02/04/2023 0541   RBC 3.76 (L) 02/04/2023 0541   HGB 8.2 (L) 02/04/2023 0541   HGB 12.0 06/01/2013 1805   HCT 27.9 (L) 02/04/2023 0541   HCT 39.4 06/01/2013 1805   PLT 755 (H) 02/04/2023 0541   PLT 325 06/01/2013 1805   MCV 74.2 (L) 02/04/2023 0541   MCV 71 (L) 06/01/2013 1805   MCH 21.8 (L) 02/04/2023 0541   MCHC 29.4 (L) 02/04/2023 0541   RDW 23.9 (H) 02/04/2023 0541   RDW 18.1 (H) 06/01/2013 1805   LYMPHSABS 2.8 01/29/2023 0541   MONOABS 1.0 01/29/2023 0541   EOSABS 0.5 01/29/2023 0541   BASOSABS 0.1 01/29/2023 0541    BMET    Component Value Date/Time   NA 133 (L) 02/05/2023 0638   NA 135 (L) 06/01/2013 1805   K 5.0 02/05/2023 0638   K 3.6 06/01/2013 1805   CL 102 02/05/2023 0638   CL 103 06/01/2013 1805   CO2 20 (L) 02/05/2023 0638   CO2 25 06/01/2013 1805   GLUCOSE 111 (H) 02/05/2023 0638   GLUCOSE 179 (H) 06/01/2013 1805   BUN 21 (H) 02/05/2023 0638   BUN 11 06/01/2013 1805   CREATININE 1.56 (H) 02/05/2023 0638   CREATININE 0.79 06/01/2013 1805   CALCIUM  9.4 02/05/2023 0638   CALCIUM  9.1 06/01/2013 1805   GFRNONAA 42 (L) 02/05/2023 0638   GFRNONAA >60 06/01/2013 1805   GFRAA >60 10/08/2016 1820   GFRAA >60 06/01/2013 1805    INR    Component Value Date/Time   INR 1.0 01/15/2023 0925    No intake or output data in the 24 hours ending 02/05/23 1211    Assessment/Plan:  43 y.o. 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 Elnor at [facility] now with ischemic changes   Hospital Day 13  -plan for OR today for right AKA.  -continue npo.    Lucie Apt, PA-C [facility] [phone] 02/05/2023 12:11 PM   Debby SAILOR. Magda, MD [facility] Phone Number: [phone] 02/05/2023 12:11 PM

## 2023-02-05 NOTE — Op Note (Signed)
 DATE OF SERVICE: 02/05/2023  PATIENT:  Jennifer [name]  43 y.o. female  PRE-OPERATIVE DIAGNOSIS:  non-healing right below knee amputation  POST-OPERATIVE DIAGNOSIS:  Same  PROCEDURE:   Right above knee amputation  SURGEON:  Surgeons and Role:    * Magda Debby SAILOR, MD - Primary  ASSISTANT: Deland Collet, PA-C  An experienced assistant was required given the complexity of this procedure and the standard of surgical care. My assistant helped with exposure through counter tension, suctioning, ligation and retraction to better visualize the surgical field.  My assistant expedited sewing during the case by following my sutures. Wherever I use the term we in the report, my assistant actively helped me with that portion of the procedure.  ANESTHESIA:   general  EBL:  BLOOD ADMINISTERED:none  DRAINS: none   LOCAL MEDICATIONS USED:  NONE  SPECIMEN:  none  COUNTS: confirmed correct.  TOURNIQUET:  none  PATIENT DISPOSITION:  PACU - hemodynamically stable.   Delay start of Pharmacological VTE agent (>24hrs) due to surgical blood loss or risk of bleeding: no  INDICATION FOR PROCEDURE: Jennifer [name] is a 43 y.o. female with non-healing left below knee amputation. After careful discussion of risks, benefits, and alternatives the patient was offered left above knee amputation. The patient understood and wished to proceed.  OPERATIVE FINDINGS: healthy amputation margin.  DESCRIPTION OF PROCEDURE: After identification of the patient in the pre-operative holding area, the patient was transferred to the operating room. The patient was positioned supine on the operating room table. Anesthesia was induced. The right leg was prepped and draped in standard fashion. A surgical pause was performed confirming correct patient, procedure, and operative location.  A generous right thigh fishmouth incision was planned on the right lower extremity with a skin marker. A sterile tourniquet was  applied to the proximal thigh. An esmarch tourniquet was used to exsanguinate the leg. The pneumatic tourniquet was inflated. An incision was made as planned. The incision was carried down with a #21 scalpel through the subcutaneous tissue, and through the posterior and anterior compartments of the thigh until the femur was encountered. The periosteum about the femur was elevated as high as possible. The femur was transected with a powered saw. The vascular bundle was found in its typical position, and was suture-ligated proximally using a 2-0 silk suture.  The sciatic nerve was identified in typical position, retracted, transected at the retracted margin, and ligated with a 2-0 silk suture.  The tourniquet was released. Hemostasis was achieved in the surgical bed. The wound was copiously irrigated. A myodesis was performed with 2-0 Vicryl. The wound was closed in layers using 2-0 Vicryl, 2-0 nylon, surgical stapler.  A clean bandage was applied.  Upon completion of the case instrument and sharps counts were confirmed correct. The patient was transferred to the PACU in good condition. I was present for all portions of the procedure.  Debby SAILOR. Magda, MD [facility] Phone Number: [phone] 02/05/2023 2:07 PM

## 2023-02-05 NOTE — H&P (Signed)
 History and Physical   PRETTY WELTMAN FMW:[id number] DOB: [birth year] DOA: 02/05/2023  PCP: [facility]   Patient coming from: Inpatient rehab  Chief Complaint: Need for AKA  HPI: Jennifer [name] is a 43 y.o. 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  Patient initially seen at [facility] from 12/12 until 12/20 with right lower extremity ischemia underwent right lower extremity popliteal stenting and right BKA.  While there was noted to have A-fib and was started on anticoagulation with close monitoring in the setting of history of menorrhagia.  Also noted to have labile diabetes.  Ultimately discharged to CIR on 12/20 where she remained until today 1/2.  While there she had continued leg pain and had issues with the operative site including worsening ischemic changes.  Vascular surgery followed and AKA was ultimately performed 1/2.  Patient still drowsy after surgery and unable to fully participate in review of systems.  ED Course: Vital signs stable today.  Lab workup today included BMP with sodium 133, bicarb 20, BUN 21, creatinine 1.56 near baseline of 1.3.  CBC was done yesterday with hemoglobin stable at 8.2 and platelets elevated 755.  Surgical pathology pending.  Reportedly did have a possible vasculitis previously.  Patient has received IV fluids and pain medication thus far perioperatively.  Review of Systems: Patient still drowsy after surgery and unable to fully participate in review of systems.  Past Medical History:  Diagnosis Date   Abnormal uterine bleeding    Due to fibriods/required transfusions   Acute blood loss anemia 10/13/2021   Acute kidney injury superimposed on chronic kidney disease (HCC) 10/13/2021   Acute lower limb ischemia 01/15/2023   Atrial fibrillation with  RVR (HCC) 05/2022   underwent cardioversion by Dr. Perla   Closed fracture of coronoid process of right ulna 2011   Diabetes mellitus without complication (HCC)    Diabetic ketoacidosis associated with type 1 diabetes mellitus (HCC) 10/06/2015   Headache    Hyperglycemia without ketosis 10/21/2015   Hypertension    Hypertensive urgency 10/13/2021   Menorrhagia    OSA (obstructive sleep apnea)    Serum potassium elevated 02/05/2023    Past Surgical History:  Procedure Laterality Date   AMPUTATION Right 01/17/2023   Procedure: AMPUTATION BELOW KNEE;  Surgeon: Elnor Norleen CROME, MD;  Location: [facility] ORS;  Service: Vascular;  Laterality: Right;   CESAREAN SECTION     x2   LOWER EXTREMITY ANGIOGRAPHY Right 01/15/2023   Procedure: Lower Extremity Angiography;  Surgeon: Marea Selinda RAMAN, MD;  Location: [facility] INVASIVE CV LAB;  Service: Cardiovascular;  Laterality: Right;   none      Social History  reports that she has been smoking. She has never used smokeless tobacco. She reports that she does not drink alcohol and does not use drugs.  Allergies  Allergen Reactions   Demerol  [Meperidine ] Hives, Itching and Nausea And Vomiting   Meloxicam Nausea And Vomiting   Oxycodone  Hives, Itching and Nausea And Vomiting   Toradol  [Ketorolac  Tromethamine ] Hives, Itching and Nausea And Vomiting    Family History  Adopted: Yes  Problem Relation Age of Onset   Diabetes Other    Hypertension Other   Reviewed on admission  Prior to Admission medications   Medication Sig Start Date End Date Taking? Authorizing Provider  acetaminophen  (TYLENOL ) 500 MG tablet Take 2 tablets (1,000 mg total)  by mouth 4 (four) times daily. 02/05/23   Love, Sharlet RAMAN, PA-C  Baclofen  5 MG TABS Take 1 tablet (5 mg total) by mouth 3 (three) times daily as needed for muscle spasms. 02/05/23   Love, Sharlet RAMAN, PA-C  cyclobenzaprine  (FLEXERIL ) 10 MG tablet Take 1 tablet (10 mg total) by mouth 3 (three) times daily. 02/05/23   Love, Sharlet RAMAN, PA-C  DEPO-PROVERA 150 MG/ML injection Inject 150 mg into the muscle every 3 (three) months. 12/08/22   [provider]  diltiazem  (CARDIZEM  CD) 180 MG 24 hr capsule Take 1 capsule (180 mg total) by mouth daily. 02/06/23   Love, Sharlet RAMAN, PA-C  gabapentin  (NEURONTIN ) 400 MG capsule Take 1 capsule (400 mg total) by mouth 4 (four) times daily. 02/05/23   Love, Sharlet RAMAN, PA-C  HYDROmorphone  (DILAUDID ) 2 MG tablet Take 1 tablet (2 mg total) by mouth every 4 (four) hours as needed for severe pain (pain score 7-10). 02/05/23   Love, Sharlet RAMAN, PA-C  Insulin  Asp Prot & Asp FlexPen (NOVOLOG  70/30 MIX) (70-30) 100 UNIT/ML FlexPen Inject 45 Units into the skin with breakfast, with lunch, and with evening meal.    [provider]  insulin  aspart (NOVOLOG ) 100 UNIT/ML injection Inject 0-9 Units into the skin 3 (three) times daily with meals. 02/05/23   Love, Sharlet RAMAN, PA-C  insulin  glargine-yfgn (SEMGLEE ) 100 UNIT/ML injection Inject 0.31 mLs (31 Units total) into the skin 2 (two) times daily. 02/05/23   Love, Sharlet RAMAN, PA-C  iron  polysaccharides (NIFEREX) 150 MG capsule Take 1 capsule (150 mg total) by mouth 2 (two) times daily before lunch and supper. 02/05/23   Love, Sharlet RAMAN, PA-C  lidocaine  (LIDODERM ) 5 % Place 1 patch onto the skin daily. Remove & Discard patch within 12 hours or as directed by MD 02/05/23   Love, Sharlet RAMAN, PA-C  magnesium  gluconate (MAGONATE) 500 MG tablet Take 0.5 tablets (250 mg total) by mouth at bedtime. 02/05/23   Love, Sharlet RAMAN, PA-C  morphine  (MS CONTIN ) 15 MG 12 hr tablet Take 3 tablets (45 mg total) by mouth every 12 (twelve) hours. 02/05/23   Love, Sharlet RAMAN, PA-C  pantoprazole  (PROTONIX ) 40 MG tablet Take 1 tablet (40 mg total) by mouth daily. 02/06/23   Love, Sharlet RAMAN, PA-C  polyethylene glycol (MIRALAX  / GLYCOLAX ) 17 g packet Take 17 g by mouth daily. 02/06/23   Love, Sharlet RAMAN, PA-C  senna (SENOKOT) 8.6 MG TABS tablet Take 2 tablets (17.2 mg total) by mouth at bedtime. 02/05/23    Love, Sharlet RAMAN, PA-C  traMADol  (ULTRAM ) 50 MG tablet Take 1 tablet (50 mg total) by mouth every 6 (six) hours as needed for moderate pain (pain score 4-6). 02/05/23   Love, Sharlet RAMAN, PA-C  traZODone  (DESYREL ) 100 MG tablet Take 1 tablet (100 mg total) by mouth at bedtime. 02/05/23   Maurice Sharlet RAMAN, PA-C    Physical Exam: Vitals:   02/05/23 1209 02/05/23 1405 02/05/23 1415 02/05/23 1430  BP: 137/83 125/82 132/86 (!) 155/88  Pulse: 93 94 96 100  Resp: 18 10 14 20   Temp: 99.2 F (37.3 C) 98 F (36.7 C)    TempSrc: Oral     SpO2: 96% 96% 97% 91%    Physical Exam Constitutional:      General: She is not in acute distress.    Appearance: Normal appearance. She is obese.  HENT:     Head: Normocephalic and atraumatic.     Mouth/Throat:  Mouth: Mucous membranes are moist.     Pharynx: Oropharynx is clear.  Eyes:     Extraocular Movements: Extraocular movements intact.     Pupils: Pupils are equal, round, and reactive to light.  Cardiovascular:     Rate and Rhythm: Regular rhythm. Tachycardia present.     Pulses: Normal pulses.     Heart sounds: Normal heart sounds.  Pulmonary:     Effort: Pulmonary effort is normal. No respiratory distress.     Breath sounds: Normal breath sounds.  Abdominal:     General: Bowel sounds are normal. There is no distension.     Palpations: Abdomen is soft.     Tenderness: There is no abdominal tenderness.  Musculoskeletal:        General: No swelling or deformity.     Comments: Status post right AKA.  Skin:    General: Skin is warm and dry.  Neurological:     General: No focal deficit present.     Mental Status: Mental status is at baseline.    Labs on Admission: I have personally reviewed following labs and imaging studies  CBC: Recent Labs  Lab 01/31/23 0542 02/01/23 0511 02/02/23 0514 02/03/23 0511 02/04/23 0541  WBC 12.3* 11.1* 11.3* 9.2 9.9  HGB 8.3* 8.4* 8.4* 8.4* 8.2*  HCT 27.5* 28.7* 28.3* 28.9* 27.9*  MCV 73.9* 74.2* 73.7*  74.5* 74.2*  PLT 661* 697* 712* 721* 755*    Basic Metabolic Panel: Recent Labs  Lab 01/30/23 1239 01/31/23 0546 02/02/23 0514 02/03/23 0511 02/05/23 0638  NA 132* 132* 134* 134* 133*  K 5.5* 4.8 5.0 5.0 5.0  CL 96* 99 100 101 102  CO2 22 23 22  20* 20*  GLUCOSE 53* 128* 99 96 111*  BUN 25* 23* 23* 25* 21*  CREATININE 1.74* 1.78* 1.74* 1.75* 1.56*  CALCIUM  9.6 9.1 9.5 9.4 9.4    GFR: Estimated Creatinine Clearance: 51.4 mL/min (A) (by C-G formula based on SCr of 1.56 mg/dL (H)).  Liver Function Tests: No results for input(s): AST, ALT, ALKPHOS, BILITOT, PROT, ALBUMIN in the last 168 hours.  Urine analysis:    Component Value Date/Time   COLORURINE YELLOW 01/27/2023 0157   APPEARANCEUR CLEAR 01/27/2023 0157   APPEARANCEUR Hazy 05/28/2013 2350   LABSPEC 1.009 01/27/2023 0157   LABSPEC 1.015 05/28/2013 2350   PHURINE 5.0 01/27/2023 0157   GLUCOSEU NEGATIVE 01/27/2023 0157   GLUCOSEU >=500 05/28/2013 2350   HGBUR LARGE (A) 01/27/2023 0157   BILIRUBINUR NEGATIVE 01/27/2023 0157   BILIRUBINUR Negative 05/28/2013 2350   KETONESUR NEGATIVE 01/27/2023 0157   PROTEINUR 30 (A) 01/27/2023 0157   NITRITE NEGATIVE 01/27/2023 0157   LEUKOCYTESUR TRACE (A) 01/27/2023 0157   LEUKOCYTESUR Negative 05/28/2013 2350    Radiological Exams on Admission: No results found.  EKG: Not performed  Assessment/Plan Principal Problem:   Limb ischemia Active Problems:   Essential hypertension   Menometrorrhagia   HLD (hyperlipidemia)   OSA (obstructive sleep apnea)   Obesity   Diabetic peripheral neuropathy (HCC)   Anemia   Controlled type 2 diabetes mellitus without complication, without long-term current use of insulin  (HCC)   PAF (paroxysmal atrial fibrillation) (HCC)   Limb ischemia > As per HPI patient initially seen at [facility] in December and underwent BKA and popliteal stenting of the right lower extremity.  Had been at inpatient rehab but had continued blistering  and ischemic changes at her BKA site and ultimately had AKA performed today, 1/2.  No operative complications reported. >  Patient does have some history with perioperative bleeding and menorrhagia so we will monitor closely overnight. > Hopefully this will help with pain control which has been an issue for her.  Anticipate returning to inpatient rehab when able. - Monitor on telemetry overnight - Appreciate vascular surgery recommendations and assistance - Continue with scheduled MS Contin  - Continue with as needed tramadol  for moderate pain and as needed p.o. Dilaudid  for severe pain - Will add as needed IV Dilaudid  for severe breakthrough pain perioperatively - Supportive care - PT/OT eval and treat  Hypertension - Continue home diltiazem   Atrial fibrillation - Continue home diltiazem  - Transition to heparin  prior to procedure - Will hold anticoagulation given history of postoperative bleeding requiring transfusion  Anemia Menorrhagia > Known history of vaginal bleeding in the setting fibroids and chronic anemia related to this. > Hemoglobin has recently been around 8.  Surgery today as above. - Monitoring telemetry overnight as above - Trend CBC - Type and screen already done - Transfuse hemoglobin less than 7 - Continue with iron  supplementation  Obesity - Noted  OSA - Continue CPAP  DVT prophylaxis: SCDs for now, previously on full dose anticoagulation Code Status:   Full Family Communication:  None on admission  Disposition Plan:   Patient is from:  Inpatient rehab  Anticipated DC to:  Same as above  Anticipated DC date:  1 to 3 days  Anticipated DC barriers: inpatient rehab  Consults called:  Vascular surgery already following Admission status:  Observation, telemetry  Severity of Illness: The appropriate patient status for this patient is OBSERVATION. Observation status is judged to be reasonable and necessary in order to provide the required intensity of  service to ensure the patient's safety. The patient's presenting symptoms, physical exam findings, and initial radiographic and laboratory data in the context of their medical condition is felt to place them at decreased risk for further clinical deterioration. Furthermore, it is anticipated that the patient will be medically stable for discharge from the hospital within 2 midnights of admission.    Marsa KATHEE Scurry MD [facility]  How to contact the [facility] Attending or Consulting provider 7A - 7P or covering provider during after hours 7P -7A, for this patient?   Check the care team in [facility] and look for a) attending/consulting [facility] provider listed and b) the [facility] team listed Log into [email or web address] and use [place]'s universal password to access. If you do not have the password, please contact the hospital operator. Locate the [facility] provider you are looking for under [facility] and page to a number that you can be directly reached. If you still have difficulty reaching the provider, please page the [facility] (Director on Call) for the Hospitalists listed on amion for assistance.  02/05/2023, 2:40 PM

## 2023-02-05 NOTE — Anesthesia Procedure Notes (Signed)
 Procedure Name: Intubation Date/Time: 02/05/2023 12:58 PM  Performed by: Cindie Donald CROME, CRNAPre-anesthesia Checklist: Patient identified, Emergency Drugs available, Suction available and Patient being monitored Patient Re-evaluated:Patient Re-evaluated prior to induction Oxygen Delivery Method: Circle System Utilized Preoxygenation: Pre-oxygenation with 100% oxygen Induction Type: IV induction Ventilation: Mask ventilation without difficulty Laryngoscope Size: Mac and 3 Grade View: Grade I Tube type: Oral Tube size: 7.0 mm Number of attempts: 1 Airway Equipment and Method: Stylet Placement Confirmation: ETT inserted through vocal cords under direct vision, positive ETCO2 and breath sounds checked- equal and bilateral Secured at: 22 cm Tube secured with: Tape Dental Injury: Teeth and Oropharynx as per pre-operative assessment

## 2023-02-05 NOTE — Transfer of Care (Signed)
 Immediate Anesthesia Transfer of Care Note  Patient: Jennifer [name]  Procedure(s) Performed: AMPUTATION ABOVE KNEE (Right: Knee)  Patient Location: PACU  Anesthesia Type:General  Level of Consciousness: drowsy and patient cooperative  Airway & Oxygen Therapy: Patient Spontanous Breathing  Post-op Assessment: Report given to RN and Post -op Vital signs reviewed and stable  Post vital signs: Reviewed and stable  Last Vitals:  Vitals Value Taken Time  BP 125/82 02/05/23 1404  Temp    Pulse 94 02/05/23 1406  Resp 15 02/05/23 1406  SpO2 97 % 02/05/23 1406  Vitals shown include unfiled device data.  Last Pain:  Vitals:   02/05/23 1209  TempSrc: Oral  PainSc: 10-Worst pain ever         Complications: No notable events documented.

## 2023-02-05 NOTE — Progress Notes (Addendum)
 Patient received on the floor at 1610 PM. On 2 L [place]. Alert and oriented. Wound vac is connected. Patient got connected to the cardiac monitoring box 10 and pulse oxymetry.  SCD placed per order. Patient is due to void. Will continue to monitor

## 2023-02-05 NOTE — Progress Notes (Deleted)
 PROGRESS NOTE   Subjective/Complaints:  Sitting at bedside.  She has AKA scheduled later today.  She continues to have increased pain and swelling in her residual limb.  ROS: +pain in residual limb and phantom limb pain-continued,  +urinary frequency- improved Denies chest pain, shortness of breath, abdominal pain, nausea, vomiting  Objective:   No results found.  Recent Labs    02/03/23 0511 02/04/23 0541  WBC 9.2 9.9  HGB 8.4* 8.2*  HCT 28.9* 27.9*  PLT 721* 755*   Recent Labs    02/03/23 0511 02/05/23 0638  NA 134* 133*  K 5.0 5.0  CL 101 102  CO2 20* 20*  GLUCOSE 96 111*  BUN 25* 21*  CREATININE 1.75* 1.56*  CALCIUM  9.4 9.4   No intake or output data in the 24 hours ending 02/05/23 1312       Physical Exam: Vital Signs Blood pressure 137/83, pulse 93, temperature 99.2 F (37.3 C), temperature source Oral, resp. rate 18, SpO2 96%.   General: No acute distress, awake and alert, going to gym with therapy . Appears less uncomfortable today Mood and affect , cooperative but flat CV: RRR no m/r/g appreciated Pulm: CTAB no w/r/r appreciated Abd: soft, NTND, +BS throughout Extremities: R BKA with swelling, bruising.  Distal residual limb cool to touch  Skin: +ischemic changes   Assessment/Plan: 1. Functional deficits which require 3+ hours per day of interdisciplinary therapy in a comprehensive inpatient rehab setting. Physiatrist is providing close team supervision and 24 hour management of active medical problems listed below. Physiatrist and rehab team continue to assess barriers to discharge/monitor patient progress toward functional and medical goals  Care Tool:  Bathing              Bathing assist       Upper Body Dressing/Undressing Upper body dressing        Upper body assist      Lower Body Dressing/Undressing Lower body dressing            Lower body assist        Toileting Toileting    Toileting assist       Transfers Chair/bed transfer  Transfers assist           Locomotion Ambulation   Ambulation assist              Walk 10 feet activity   Assist           Walk 50 feet activity   Assist           Walk 150 feet activity   Assist           Walk 10 feet on uneven surface  activity   Assist           Wheelchair     Assist               Wheelchair 50 feet with 2 turns activity    Assist            Wheelchair 150 feet activity     Assist          Blood pressure 137/83, pulse 93, temperature 99.2 F (  37.3 C), temperature source Oral, resp. rate 18, SpO2 96%.  Medical Problem List and Plan: 1. Functional deficits secondary to R BKA PAD             -patient may shower if RLE covered             -ELOS/Goals: 9-12 days, PT/OT mod I, goal 02/03/23             Grounds pass ordered  Limb guard ordered  -AKA scheduled for today 2.  Antithrombotics: switched from eliquis  to heparin              -antiplatelet therapy: N/A 3. Pain Management: decrease dilaudid  to 2mg  and gabapentin  to 400mg  given drowsiness. Long acting morphine  added --On robaxin  1000 mg TID does not seen to help will use  change to  flexeril  10mg  TID-12/22--increase gabapentin  to 800mg  TID Given GFR 48 will dose 800mg  q 12 h Has not taken hydrocodone  q6h,3 times yesterday , discussed she may take another time per day DIscussed with OT, may benefit from TENS unit, has PT next  Add lidoderm  patch right post knee -01/31/23 pain manageable overall, cont regimen, but advised pt to elevate the stump to help with swelling; unclear if she should be wearing her shrinker? -12/30 discussed with pharmacy.  Will increase MS Contin  to 30 mg, stop scheduled hydrocodone  -1/1 Increase MS contin  to 45mg  Q12h -1/2 continue current regimen, hopefully pain will improve overall after her surgery  4. Mood/Behavior/Sleep:   LCSW to follow for evaluation and support.              -antipsychotic agents: N/A 5. Neuropsych/cognition: This patient is capable of making decisions on her own behalf. 6. Skin/Wound Care: Routine pressure relief measures.  7. Fluids/Electrolytes/Nutrition: Monitor I/O. Monitor labs  -01/31/23 BMP fairly stable, hyponatremia as below, Cr stable 1.78  1/2 creatinine improved to 1.56 8.  H/o A flutter/PAF: Monitor HR TID--continue Cardizem  and Apixaban              --monitor for symptoms with increase in activity.  9. T1DM: Hgb A1c-9.2                         --Continue Insulin  glargline bid--reports compliance at home.   D/c ISS, decreased CBG checks to AC/HS  Decrease novolog  to 10U BID  Controlled   -12/31 Hypoglycemia yesterday, meal coverage  decreased to 4 units, decrease lantus  to 36u BID  1/1 decrease lantus  to 31u BID  1/1 addendum, hypoglycemia, stop mealtime insulin , start correctional  1/2 monitor response to medication change CBG (last 3)  Recent Labs    02/04/23 2139 02/05/23 0621 02/05/23 1118  GLUCAP 128* 112* 97    10. Hyponatremia: Improved from 127 @ admission to 134             --Monitor for now  -01/31/23 up to 132, stable, cont monitoring  1/2 sodium overall stable at 133 today 11. [id number]?:  Per chart review SCr-1.56 on 08/24 and  1.33 @ admission.              --Monitor renal status with serial checks. Avoid nephrotoxic medications. Encouraging oral hydration.   -01/31/23 Cr 1.78 but stable from yesterday, monitor -12/31 creatinine 1.75, will check PVR x2, Cr  stable from last few readings but a little higher then prior, will start IVF for 1 day,  -1/2 creatinine a little improved to 1.56 12. 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    --hx of menorrhagia/Fibroids-->on depo with good results PTA but has had recurrance w/clots  past 3 days.  Hgb reviewed and improved  Transfuse if <7.0- recheck in am , no dizziness with position changes, recheck in am   -01/31/23 Hgb 8.3 today, stable, monitor -02/01/23 Hgb 8.4 today, doing great; vaginal bleeding subsiding too -1/1 Stable HGB 8.2    Latest Ref Rng & Units 02/04/2023    5:41 AM 02/03/2023    5:11 AM 02/02/2023    5:14 AM  CBC  WBC 4.0 - 10.5 K/uL 9.9  9.2  11.3   Hemoglobin 12.0 - 15.0 g/dL 8.2  8.4  8.4   Hematocrit 36.0 - 46.0 % 27.9  28.9  28.3   Platelets 150 - 400 K/uL 755  721  712     14. Obesity. BMI 34 on admission. Dietary counseling 15. Constipation. Miralax , senokot. Changed senna to 2 tabs HS, milk of magnesia ordered 12/25  -02/02/23 LBM yesterday; cont regimen   16. Hyperkalemia. Monitor labs, lokelma  ordered 12/27, repeat labs in AM  -01/31/23 K 4.8 today, monitor with routine labs  -12/31 stable at 5.0, consider low K diet if increases further  1/2 remains overall stable at 5.0    Latest Ref Rng & Units 02/05/2023    6:38 AM 02/03/2023    5:11 AM 02/02/2023    5:14 AM  BMP  Glucose 70 - 99 mg/dL 888  96  99   BUN 6 - 20 mg/dL 21  25  23    Creatinine 0.44 - 1.00 mg/dL 8.43  8.24  8.25   Sodium 135 - 145 mmol/L 133  134  134   Potassium 3.5 - 5.1 mmol/L 5.0  5.0  5.0   Chloride 98 - 111 mmol/L 102  101  100   CO2 22 - 32 mmol/L 20  20  22    Calcium  8.9 - 10.3 mg/dL 9.4  9.4  9.5      17. HTN. Lisinopril  and hydrochlorothiazide  have been stopped. BP stable, continue to hold, magnesium  supplement started HS  -1/2 BP controlled, continue to monitor    Vitals:   02/05/23 1209  BP: 137/83     18. Blistering at incision site: [facility]ra  scheduled, d/c'd 12/25  19. Leukocytosis: Vibra  scheduled, reviewed and improved, d/c'd 12/25  20. 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 Active smoker: denies desire to smoke, nicotine  patch d/ced  22. Vaginal bleeding: hx of fibroids and menorrhagia, seems to be slowing  as of 02/01/23; monitor H&H with heparin  use.      LOS: 0 days A FACE TO FACE EVALUATION WAS PERFORMED  Murray Collier 02/05/2023, 1:12 PM

## 2023-02-05 NOTE — Progress Notes (Signed)
  Progress Note    02/05/2023 7:18 AM Hospital Day 13  Subjective:  continues to have increasing pain and swelling  Afebrile   Vitals:   02/04/23 2000 02/05/23 0516  BP: 119/76 123/75  Pulse: 82 84  Resp: 17 17  Temp: 98.6 F (37 C) 98.6 F (37 C)  SpO2: 99% 100%    Physical Exam: General:  sleeping, wakes easily Lungs:  non labored Extremities:  ischemic right BKA  CBC    Component Value Date/Time   WBC 9.9 02/04/2023 0541   RBC 3.76 (L) 02/04/2023 0541   HGB 8.2 (L) 02/04/2023 0541   HGB 12.0 06/01/2013 1805   HCT 27.9 (L) 02/04/2023 0541   HCT 39.4 06/01/2013 1805   PLT 755 (H) 02/04/2023 0541   PLT 325 06/01/2013 1805   MCV 74.2 (L) 02/04/2023 0541   MCV 71 (L) 06/01/2013 1805   MCH 21.8 (L) 02/04/2023 0541   MCHC 29.4 (L) 02/04/2023 0541   RDW 23.9 (H) 02/04/2023 0541   RDW 18.1 (H) 06/01/2013 1805   LYMPHSABS 2.8 01/29/2023 0541   MONOABS 1.0 01/29/2023 0541   EOSABS 0.5 01/29/2023 0541   BASOSABS 0.1 01/29/2023 0541    BMET    Component Value Date/Time   NA 134 (L) 02/03/2023 0511   NA 135 (L) 06/01/2013 1805   K 5.0 02/03/2023 0511   K 3.6 06/01/2013 1805   CL 101 02/03/2023 0511   CL 103 06/01/2013 1805   CO2 20 (L) 02/03/2023 0511   CO2 25 06/01/2013 1805   GLUCOSE 96 02/03/2023 0511   GLUCOSE 179 (H) 06/01/2013 1805   BUN 25 (H) 02/03/2023 0511   BUN 11 06/01/2013 1805   CREATININE 1.75 (H) 02/03/2023 0511   CREATININE 0.79 06/01/2013 1805   CALCIUM  9.4 02/03/2023 0511   CALCIUM  9.1 06/01/2013 1805   GFRNONAA 36 (L) 02/03/2023 0511   GFRNONAA >60 06/01/2013 1805   GFRAA >60 10/08/2016 1820   GFRAA >60 06/01/2013 1805    INR    Component Value Date/Time   INR 1.0 01/15/2023 0925     Intake/Output Summary (Last 24 hours) at 02/05/2023 0718 Last data filed at 02/04/2023 1750 Gross per 24 hour  Intake 477 ml  Output --  Net 477 ml     Assessment/Plan:  43 y.o. 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 Elnor at [facility] now with  ischemic changes   Hospital Day 13  -plan for OR today for right AKA.  -continue npo.    Lucie Apt, PA-C [facility] [phone] 02/05/2023 7:18 AM

## 2023-02-05 NOTE — Progress Notes (Signed)
   02/05/23 1930  BiPAP/CPAP/SIPAP  BiPAP/CPAP/SIPAP Pt Type Adult  Reason BIPAP/CPAP not in use Non-compliant   Pt. Refused cpap.

## 2023-02-06 ENCOUNTER — Encounter (HOSPITAL_COMMUNITY): Payer: Self-pay | Admitting: Vascular Surgery

## 2023-02-06 DIAGNOSIS — Z885 Allergy status to narcotic agent status: Secondary | ICD-10-CM | POA: Diagnosis not present

## 2023-02-06 DIAGNOSIS — Z8249 Family history of ischemic heart disease and other diseases of the circulatory system: Secondary | ICD-10-CM | POA: Diagnosis not present

## 2023-02-06 DIAGNOSIS — Z794 Long term (current) use of insulin: Secondary | ICD-10-CM | POA: Diagnosis not present

## 2023-02-06 DIAGNOSIS — E669 Obesity, unspecified: Secondary | ICD-10-CM | POA: Diagnosis present

## 2023-02-06 DIAGNOSIS — G4733 Obstructive sleep apnea (adult) (pediatric): Secondary | ICD-10-CM | POA: Diagnosis present

## 2023-02-06 DIAGNOSIS — Y835 Amputation of limb(s) as the cause of abnormal reaction of the patient, or of later complication, without mention of misadventure at the time of the procedure: Secondary | ICD-10-CM | POA: Diagnosis present

## 2023-02-06 DIAGNOSIS — F17200 Nicotine dependence, unspecified, uncomplicated: Secondary | ICD-10-CM | POA: Diagnosis present

## 2023-02-06 DIAGNOSIS — N920 Excessive and frequent menstruation with regular cycle: Secondary | ICD-10-CM | POA: Diagnosis present

## 2023-02-06 DIAGNOSIS — N921 Excessive and frequent menstruation with irregular cycle: Secondary | ICD-10-CM | POA: Diagnosis not present

## 2023-02-06 DIAGNOSIS — G546 Phantom limb syndrome with pain: Secondary | ICD-10-CM | POA: Diagnosis not present

## 2023-02-06 DIAGNOSIS — D259 Leiomyoma of uterus, unspecified: Secondary | ICD-10-CM | POA: Diagnosis present

## 2023-02-06 DIAGNOSIS — Z833 Family history of diabetes mellitus: Secondary | ICD-10-CM | POA: Diagnosis not present

## 2023-02-06 DIAGNOSIS — E1042 Type 1 diabetes mellitus with diabetic polyneuropathy: Secondary | ICD-10-CM | POA: Diagnosis present

## 2023-02-06 DIAGNOSIS — Z886 Allergy status to analgesic agent status: Secondary | ICD-10-CM | POA: Diagnosis not present

## 2023-02-06 DIAGNOSIS — Z6829 Body mass index (BMI) 29.0-29.9, adult: Secondary | ICD-10-CM | POA: Diagnosis not present

## 2023-02-06 DIAGNOSIS — D649 Anemia, unspecified: Secondary | ICD-10-CM | POA: Diagnosis present

## 2023-02-06 DIAGNOSIS — I10 Essential (primary) hypertension: Secondary | ICD-10-CM | POA: Diagnosis present

## 2023-02-06 DIAGNOSIS — I480 Paroxysmal atrial fibrillation: Secondary | ICD-10-CM | POA: Diagnosis present

## 2023-02-06 DIAGNOSIS — I70221 Atherosclerosis of native arteries of extremities with rest pain, right leg: Secondary | ICD-10-CM | POA: Diagnosis present

## 2023-02-06 DIAGNOSIS — S78111A Complete traumatic amputation at level between right hip and knee, initial encounter: Secondary | ICD-10-CM | POA: Diagnosis not present

## 2023-02-06 DIAGNOSIS — I998 Other disorder of circulatory system: Secondary | ICD-10-CM | POA: Diagnosis present

## 2023-02-06 DIAGNOSIS — E785 Hyperlipidemia, unspecified: Secondary | ICD-10-CM | POA: Diagnosis present

## 2023-02-06 DIAGNOSIS — E1051 Type 1 diabetes mellitus with diabetic peripheral angiopathy without gangrene: Secondary | ICD-10-CM | POA: Diagnosis present

## 2023-02-06 DIAGNOSIS — Z79899 Other long term (current) drug therapy: Secondary | ICD-10-CM | POA: Diagnosis not present

## 2023-02-06 DIAGNOSIS — I4891 Unspecified atrial fibrillation: Secondary | ICD-10-CM | POA: Diagnosis not present

## 2023-02-06 DIAGNOSIS — T8789 Other complications of amputation stump: Secondary | ICD-10-CM | POA: Diagnosis present

## 2023-02-06 LAB — COMPREHENSIVE METABOLIC PANEL
ALT: 14 U/L (ref 0–44)
AST: 16 U/L (ref 15–41)
Albumin: 2.4 g/dL — ABNORMAL LOW (ref 3.5–5.0)
Alkaline Phosphatase: 82 U/L (ref 38–126)
Anion gap: 11 (ref 5–15)
BUN: 26 mg/dL — ABNORMAL HIGH (ref 6–20)
CO2: 21 mmol/L — ABNORMAL LOW (ref 22–32)
Calcium: 9.4 mg/dL (ref 8.9–10.3)
Chloride: 102 mmol/L (ref 98–111)
Creatinine, Ser: 1.73 mg/dL — ABNORMAL HIGH (ref 0.44–1.00)
GFR, Estimated: 37 mL/min — ABNORMAL LOW (ref >60)
Glucose, Bld: 121 mg/dL — ABNORMAL HIGH (ref 70–99)
Potassium: 5.8 mmol/L — ABNORMAL HIGH (ref 3.5–5.1)
Sodium: 134 mmol/L — ABNORMAL LOW (ref 135–145)
Total Bilirubin: 0.5 mg/dL (ref 0.0–1.2)
Total Protein: 7.9 g/dL (ref 6.5–8.1)

## 2023-02-06 LAB — CBC
HCT: 24.2 % — ABNORMAL LOW (ref 36.0–46.0)
Hemoglobin: 7.1 g/dL — ABNORMAL LOW (ref 12.0–15.0)
MCH: 21.8 pg — ABNORMAL LOW (ref 26.0–34.0)
MCHC: 29.3 g/dL — ABNORMAL LOW (ref 30.0–36.0)
MCV: 74.5 fL — ABNORMAL LOW (ref 80.0–100.0)
Platelets: 644 10*3/uL — ABNORMAL HIGH (ref 150–400)
RBC: 3.25 MIL/uL — ABNORMAL LOW (ref 3.87–5.11)
RDW: 23.8 % — ABNORMAL HIGH (ref 11.5–15.5)
WBC: 15.4 10*3/uL — ABNORMAL HIGH (ref 4.0–10.5)
nRBC: 0 % (ref 0.0–0.2)

## 2023-02-06 LAB — GLUCOSE, CAPILLARY
Glucose-Capillary: 124 mg/dL — ABNORMAL HIGH (ref 70–99)
Glucose-Capillary: 127 mg/dL — ABNORMAL HIGH (ref 70–99)
Glucose-Capillary: 105 mg/dL — ABNORMAL HIGH (ref 70–99)

## 2023-02-06 LAB — HEMOGLOBIN AND HEMATOCRIT, BLOOD
HCT: 26.1 % — ABNORMAL LOW (ref 36.0–46.0)
Hemoglobin: 7.7 g/dL — ABNORMAL LOW (ref 12.0–15.0)

## 2023-02-06 LAB — PREPARE RBC (CROSSMATCH)

## 2023-02-06 MED ORDER — SODIUM CHLORIDE 0.9% IV SOLUTION: Freq: Once | INTRAVENOUS | Status: AC

## 2023-02-06 MED ORDER — SODIUM ZIRCONIUM CYCLOSILICATE 10 G PO PACK
10.0000 g | PACK | Freq: Once | ORAL | Status: AC
  Administered 2023-02-06: 10 g via ORAL
  Filled 2023-02-06: qty 1

## 2023-02-06 MED ORDER — HYDROMORPHONE HCL 1 MG/ML IJ SOLN
1.0000 mg | Freq: Once | INTRAMUSCULAR | Status: AC
  Administered 2023-02-06: 1 mg via INTRAVENOUS
  Filled 2023-02-06: qty 1

## 2023-02-06 NOTE — Progress Notes (Signed)
 Patient refusing Bipap/Cpap for tonight.  No distress noted.  Will continue to monitor.

## 2023-02-06 NOTE — Progress Notes (Addendum)
  Progress Note    02/06/2023 8:31 AM 1 Day Post-Op  Subjective:  having surgical pain but pre op pain is improved  Afebrile  Vitals:   02/06/23 0319 02/06/23 0711  BP: 123/83 117/79  Pulse: 88 86  Resp: 18 16  Temp: 97.6 F (36.4 C) 98 F (36.7 C)  SpO2: 100% 100%    Physical Exam: Incisions:  vac with stump sock in place   CBC    Component Value Date/Time   WBC 14.2 (H) 02/05/2023 2227   RBC 3.25 (L) 02/05/2023 2227   HGB 7.1 (L) 02/05/2023 2227   HGB 12.0 06/01/2013 1805   HCT 24.0 (L) 02/05/2023 2227   HCT 39.4 06/01/2013 1805   PLT 657 (H) 02/05/2023 2227   PLT 325 06/01/2013 1805   MCV 73.8 (L) 02/05/2023 2227   MCV 71 (L) 06/01/2013 1805   MCH 21.8 (L) 02/05/2023 2227   MCHC 29.6 (L) 02/05/2023 2227   RDW 23.6 (H) 02/05/2023 2227   RDW 18.1 (H) 06/01/2013 1805   LYMPHSABS 2.8 01/29/2023 0541   MONOABS 1.0 01/29/2023 0541   EOSABS 0.5 01/29/2023 0541   BASOSABS 0.1 01/29/2023 0541    BMET    Component Value Date/Time   NA 133 (L) 02/05/2023 0638   NA 135 (L) 06/01/2013 1805   K 5.0 02/05/2023 0638   K 3.6 06/01/2013 1805   CL 102 02/05/2023 0638   CL 103 06/01/2013 1805   CO2 20 (L) 02/05/2023 0638   CO2 25 06/01/2013 1805   GLUCOSE 111 (H) 02/05/2023 0638   GLUCOSE 179 (H) 06/01/2013 1805   BUN 21 (H) 02/05/2023 0638   BUN 11 06/01/2013 1805   CREATININE 1.56 (H) 02/05/2023 0638   CREATININE 0.79 06/01/2013 1805   CALCIUM  9.4 02/05/2023 0638   CALCIUM  9.1 06/01/2013 1805   GFRNONAA 42 (L) 02/05/2023 0638   GFRNONAA >60 06/01/2013 1805   GFRAA >60 10/08/2016 1820   GFRAA >60 06/01/2013 1805    INR    Component Value Date/Time   INR 1.0 01/15/2023 0925     Intake/Output Summary (Last 24 hours) at 02/06/2023 0831 Last data filed at 02/06/2023 0800 Gross per 24 hour  Intake 1240 ml  Output 100 ml  Net 1140 ml     Assessment/Plan:  43 y.o. female is s/p right above knee amputation   1 Day Post-Op   -pt pre op pain  improved -has incisional vac with stump sock in place.  Will probably leave in place 5-7 days.   -most likely back to CIR in the next day or two per primary team -she will f/u in our office in 4-5 weeks for staple removal.  Our office will arrange appt.    Lucie Apt, PA-C [facility] [phone] 02/06/2023 8:31 AM     VASCULAR STAFF ADDENDUM: I have independently interviewed and examined the patient. I agree with the above.   Debby SAILOR. Magda, MD [facility] Phone Number: [phone] 02/06/2023 3:03 PM

## 2023-02-06 NOTE — Progress Notes (Signed)
 Patient have not voided during the night shift. Nurse offered to bladder scan the patient 3x and the patient refused.   Jennifer [name]

## 2023-02-06 NOTE — Anesthesia Postprocedure Evaluation (Signed)
 Anesthesia Post Note  Patient: Jennifer [name]  Procedure(s) Performed: AMPUTATION ABOVE KNEE (Right: Knee)     Patient location during evaluation: PACU Anesthesia Type: General Level of consciousness: sedated and patient cooperative Pain management: pain level controlled Vital Signs Assessment: post-procedure vital signs reviewed and stable Respiratory status: spontaneous breathing Cardiovascular status: stable Anesthetic complications: no   No notable events documented.  Last Vitals:  Vitals:   02/06/23 1356 02/06/23 1919  BP: (!) 140/84 130/77  Pulse: 100 99  Resp: 16 17  Temp: 36.9 C 37.5 C  SpO2: 100% 95%    Last Pain:  Vitals:   02/06/23 1919  TempSrc: Oral  PainSc:                  Norleen Pope

## 2023-02-06 NOTE — Progress Notes (Signed)
 Blood transfusion stopped at 1320 PM. RN at bed side. No reaction noted. Will continue to monitor

## 2023-02-06 NOTE — Progress Notes (Signed)
 Blood transfusion started at 1039 AM at the rate of 95ml/hour.  RN at bed side. No reactions noted. Will continue to monitor

## 2023-02-06 NOTE — Progress Notes (Signed)
 Blood is being transfused. RN at bed side. Increased the rate after 15 min. No reaction is noted. Will continue to monitor.

## 2023-02-06 NOTE — Evaluation (Signed)
 Physical Therapy Evaluation Patient Details Name: Jennifer [name] MRN: [id number] DOB: October 01, 1980 Today's Date: 02/06/2023  History of Present Illness  Pt is a 43 y.o. 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   Clinical Impression  Pt admitted with above complications, s/p Rt AKA. Required between min assist +1 and min assist +2 for functional tasks including bed mobility, sit to stand transition from various surfaces, and step pivot transfer between bed and bedside commode. Very slow and guarded due to pain. Educated on desensitization techniques, elevation, and gentle exercise. She is hopeful to return to CIR where she would like to finish her initial POC and return home at a more independent level. Patient will benefit from intensive inpatient follow up therapy, >3 hours/day. Pt currently with functional limitations due to the deficits listed below (see PT Problem List). Pt will benefit from acute skilled PT to increase their independence and safety with mobility to allow discharge.           If plan is discharge home, recommend the following: A little help with walking and/or transfers;Assistance with cooking/housework;A little help with bathing/dressing/bathroom;Assist for transportation;Help with stairs or ramp for entrance   Can travel by private vehicle        Equipment Recommendations Other (comment) (Defer to next level of care)  Recommendations for Other Services  Rehab consult    Functional Status Assessment Patient has had a recent decline in their functional status and demonstrates the ability to make significant improvements in function in a reasonable and predictable amount of time.     Precautions / Restrictions Precautions Precautions: Fall Restrictions Weight Bearing Restrictions Per Provider  Order: Yes RLE Weight Bearing Per Provider Order: Non weight bearing Other Position/Activity Restrictions: s/p R AKA      Mobility  Bed Mobility Overal bed mobility: Needs Assistance Bed Mobility: Supine to Sit, Sit to Supine     Supine to sit: Min assist Sit to supine: Min assist   General bed mobility comments: Grossly at a min assist level to assist with repositioning hips due to pain when lifting Rt leg. VC for technique. Very slow and guarded. Encouraged to try rolling technique but declines.    Transfers Overall transfer level: Needs assistance Equipment used: Rolling walker (2 wheels) Transfers: Sit to/from Stand, Bed to chair/wheelchair/BSC Sit to Stand: Min assist, +2 safety/equipment, +2 physical assistance Stand pivot transfers: Contact guard assist, +2 safety/equipment         General transfer comment: Min assist +2 for boost to stand from bed, CGA to rise from [facility]. Needs cues for safety and awareness of lines/leads/tubes. CGA for pivot to and from bed<>BSC. RW for support.    Ambulation/Gait                  Stairs            Wheelchair Mobility     Tilt Bed    Modified Rankin (Stroke Patients Only)       Balance Overall balance assessment: Needs assistance Sitting-balance support: Feet supported Sitting balance-Leahy Scale: Good Sitting balance - Comments: sitting EOB   Standing balance support: Bilateral upper extremity supported, During functional activity, Reliant on assistive device for balance Standing balance-Leahy Scale: Poor Standing balance comment: RW  Pertinent Vitals/Pain Pain Assessment Pain Assessment: Faces Faces Pain Scale: Hurts even more Pain Location: Rt residual limb Pain Descriptors / Indicators: Discomfort, Grimacing, Moaning, Aching Pain Intervention(s): Monitored during session, Repositioned    Home Living Family/patient expects to be discharged to:: Inpatient  rehab Living Arrangements: Children;Spouse/significant other Available Help at Discharge: Family;Available PRN/intermittently Type of Home: House Home Access: Stairs to enter Entrance Stairs-Rails: Right Entrance Stairs-Number of Steps: 4   Home Layout: One level Home Equipment: Crutches      Prior Function Prior Level of Function : Independent/Modified Independent             Mobility Comments: Independent without AE PTA, denies falls history. Appears to have reached a Mod I level with transfers, pushing herself short distances with w/c prior to re-admission. ADLs Comments: Independent, caring for family, eager to get back to PLOF     Extremity/Trunk Assessment   Upper Extremity Assessment Upper Extremity Assessment: Defer to OT evaluation    Lower Extremity Assessment Lower Extremity Assessment: RLE deficits/detail RLE Deficits / Details: s/p R AKA, able to raise from bed with minimal clearance and great effort RLE: Unable to fully assess due to pain       Communication   Communication Communication: No apparent difficulties  Cognition Arousal: Alert Behavior During Therapy: WFL for tasks assessed/performed Overall Cognitive Status: Within Functional Limits for tasks assessed                                          General Comments General comments (skin integrity, edema, etc.): Educated on desensitization techniques, LE elevation.    Exercises Other Exercises Other Exercises: Verbally reviewed hip adduction and flexion   Assessment/Plan    PT Assessment Patient needs continued PT services  PT Problem List Pain;Decreased knowledge of precautions;Decreased mobility;Impaired sensation;Decreased balance;Decreased knowledge of use of DME;Decreased strength;Decreased activity tolerance;Decreased safety awareness;Obesity       PT Treatment Interventions Gait training;Patient/family education;Stair training;Wheelchair mobility  training;Functional mobility training;Therapeutic activities;Therapeutic exercise;Balance training;Neuromuscular re-education;DME instruction;Modalities    PT Goals (Current goals can be found in the Care Plan section)  Acute Rehab PT Goals Patient Stated Goal: Finish CIR PT Goal Formulation: With patient Time For Goal Achievement: 02/20/23 Potential to Achieve Goals: Good    Frequency Min 1X/week     Co-evaluation   Reason for Co-Treatment: Complexity of the patient's impairments (multi-system involvement);To address functional/ADL transfers PT goals addressed during session: Mobility/safety with mobility;Balance;Proper use of DME OT goals addressed during session: ADL's and self-care;Strengthening/ROM       AM-PAC PT 6 Clicks Mobility  Outcome Measure Help needed turning from your back to your side while in a flat bed without using bedrails?: A Little Help needed moving from lying on your back to sitting on the side of a flat bed without using bedrails?: A Little Help needed moving to and from a bed to a chair (including a wheelchair)?: A Little Help needed standing up from a chair using your arms (e.g., wheelchair or bedside chair)?: A Lot Help needed to walk in hospital room?: A Little Help needed climbing 3-5 steps with a railing? : A Lot 6 Click Score: 16    End of Session Equipment Utilized During Treatment: Gait belt Activity Tolerance: Patient limited by pain Patient left: in bed;with call bell/phone within reach;with bed alarm set Nurse Communication: Mobility status PT Visit Diagnosis: Difficulty in walking, not  elsewhere classified (R26.2);Pain;Other abnormalities of gait and mobility (R26.89);Unsteadiness on feet (R26.81) Pain - Right/Left: Right Pain - part of body: Leg    Time: 8791-8758 PT Time Calculation (min) (ACUTE ONLY): 33 min   Charges:   PT Evaluation $PT Eval Low Complexity: 1 Low   PT General Charges $$ ACUTE PT VISIT: 1 Visit          Leontine Roads, PT, DPT [facility]  Rehabilitation Services Physical Therapist Office: [phone] Website: [email or web address]   Leontine GORMAN Roads 02/06/2023, 3:37 PM

## 2023-02-06 NOTE — Progress Notes (Signed)
 Progress Note    Jennifer [name]   FMW:[id number]  DOB: 04/05/1980  DOA: 02/05/2023     1 PCP: [facility]  Initial CC: R stump wound  Hospital Course: Jennifer [name] is a 43 y.o. 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   Patient initially seen at [facility] from 12/12 until 12/20 with right lower extremity ischemia underwent right lower extremity popliteal stenting and right BKA.  While there was noted to have A-fib and was started on anticoagulation with close monitoring in the setting of history of menorrhagia.  Also noted to have labile diabetes.   Ultimately discharged to CIR on 12/20 where she remained until 1/2.  While there she had continued leg pain and had issues with the operative site including worsening ischemic changes.  Vascular surgery followed and AKA was ultimately performed 02/05/23.    Patient still drowsy after surgery and unable to fully participate in review of systems.  Interval History:  No events overnight. Drowsy this morning but resting comfortably. Does note pain in right stump post op but is expected.   Assessment and Plan:  RLE limb ischemia - As per HPI patient initially seen at [facility] in December and underwent BKA and popliteal stenting of the right lower extremity.  Had been at inpatient rehab but had continued blistering and ischemic changes at her BKA site and ultimately had AKA performed, 02/05/23.  No operative complications reported. - Patient does have some history with perioperative bleeding and menorrhagia so we will monitor closely -Anticipating return to CIR in a couple days - Appreciate vascular surgery recommendations and assistance - Continue with scheduled MS Contin  - Continue with as needed tramadol  for moderate pain and as needed p.o. Dilaudid   for severe pain - Will add as needed IV Dilaudid  for severe breakthrough pain perioperatively - Supportive care - PT/OT eval and treat   Hypertension - Continue home diltiazem    Atrial fibrillation - Continue home diltiazem  - Will hold anticoagulation given history of postoperative bleeding requiring transfusion   Anemia Menorrhagia > Known history of vaginal bleeding in the setting fibroids and chronic anemia related to this > Hemoglobin has recently been around 8.  - Monitoring telemetry overnight as above - Trend CBC - Type and screen already done - Transfuse hemoglobin less than 7 - Continue with iron  supplementation   Obesity - Noted   OSA - Continue CPAP   Old records reviewed in assessment of this patient  Antimicrobials:   DVT prophylaxis:  SCDs Start: 02/05/23 1435   Code Status:   Code Status: Full Code  Mobility Assessment (Last 72 Hours)     Mobility Assessment     Row Name 02/06/23 0800 02/05/23 1950 02/05/23 1700       Does patient have an order for bedrest or is patient medically unstable No - Continue assessment No - Continue assessment No - Continue assessment     What is the highest level of mobility based on the progressive mobility assessment? Level 3 (Stands with assist) - Balance while standing  and cannot march in place Level 1 (Bedfast) - Unable to balance while sitting on edge of bed Level 3 (Stands with assist) - Balance while standing  and cannot march in place     Is the above level different from baseline mobility prior to current illness? -- Yes - Recommend PT  order --              Barriers to discharge: none Disposition Plan:  ?CIR HH orders placed: n/a Status is: Inpt  Objective: Blood pressure 135/85, pulse 92, temperature 99.3 F (37.4 C), resp. rate 17, weight 86.6 kg, SpO2 100%.  Examination:  Physical Exam Constitutional:      General: She is not in acute distress.    Appearance: Normal appearance.      Comments: Drowsy and lethargic  HENT:     Head: Normocephalic and atraumatic.     Mouth/Throat:     Mouth: Mucous membranes are moist.  Eyes:     Extraocular Movements: Extraocular movements intact.  Cardiovascular:     Rate and Rhythm: Normal rate and regular rhythm.  Pulmonary:     Effort: Pulmonary effort is normal. No respiratory distress.     Breath sounds: Normal breath sounds. No wheezing.  Abdominal:     General: Bowel sounds are normal. There is no distension.     Palpations: Abdomen is soft.     Tenderness: There is no abdominal tenderness.  Musculoskeletal:     Cervical back: Normal range of motion and neck supple.     Comments: R AKA noted with WV in place on stump  Skin:    General: Skin is warm and dry.  Neurological:     General: No focal deficit present.     Mental Status: She is alert.  Psychiatric:        Mood and Affect: Mood normal.      Consultants:  Vascular surgery  Procedures:  02/05/2023: Right AKA  Data Reviewed: Results for orders placed or performed during the hospital encounter of 02/05/23 (from the past 24 hours)  Glucose, capillary     Status: Abnormal   Collection Time: 02/05/23  2:05 PM  Result Value Ref Range   Glucose-Capillary 109 (H) 70 - 99 mg/dL   Comment 1 Notify RN   CBC     Status: Abnormal   Collection Time: 02/05/23  4:29 PM  Result Value Ref Range   WBC 13.9 (H) 4.0 - 10.5 K/uL   RBC 3.22 (L) 3.87 - 5.11 MIL/uL   Hemoglobin 7.1 (L) 12.0 - 15.0 g/dL   HCT 76.1 (L) 63.9 - 53.9 %   MCV 73.9 (L) 80.0 - 100.0 fL   MCH 22.0 (L) 26.0 - 34.0 pg   MCHC 29.8 (L) 30.0 - 36.0 g/dL   RDW 76.2 (H) 88.4 - 84.4 %   Platelets 656 (H) 150 - 400 K/uL   nRBC 0.0 0.0 - 0.2 %  Glucose, capillary     Status: Abnormal   Collection Time: 02/05/23  9:21 PM  Result Value Ref Range   Glucose-Capillary 183 (H) 70 - 99 mg/dL  CBC     Status: Abnormal   Collection Time: 02/05/23 10:27 PM  Result Value Ref Range   WBC 14.2 (H) 4.0 - 10.5 K/uL    RBC 3.25 (L) 3.87 - 5.11 MIL/uL   Hemoglobin 7.1 (L) 12.0 - 15.0 g/dL   HCT 75.9 (L) 63.9 - 53.9 %   MCV 73.8 (L) 80.0 - 100.0 fL   MCH 21.8 (L) 26.0 - 34.0 pg   MCHC 29.6 (L) 30.0 - 36.0 g/dL   RDW 76.3 (H) 88.4 - 84.4 %   Platelets 657 (H) 150 - 400 K/uL   nRBC 0.0 0.0 - 0.2 %  Glucose, capillary     Status: Abnormal   Collection  Time: 02/06/23  7:10 AM  Result Value Ref Range   Glucose-Capillary 124 (H) 70 - 99 mg/dL   Comment 1 Notify RN    Comment 2 Document in Chart   Comprehensive metabolic panel     Status: Abnormal   Collection Time: 02/06/23  8:09 AM  Result Value Ref Range   Sodium 134 (L) 135 - 145 mmol/L   Potassium 5.8 (H) 3.5 - 5.1 mmol/L   Chloride 102 98 - 111 mmol/L   CO2 21 (L) 22 - 32 mmol/L   Glucose, Bld 121 (H) 70 - 99 mg/dL   BUN 26 (H) 6 - 20 mg/dL   Creatinine, Ser 8.26 (H) 0.44 - 1.00 mg/dL   Calcium  9.4 8.9 - 10.3 mg/dL   Total Protein 7.9 6.5 - 8.1 g/dL   Albumin 2.4 (L) 3.5 - 5.0 g/dL   AST 16 15 - 41 U/L   ALT 14 0 - 44 U/L   Alkaline Phosphatase 82 38 - 126 U/L   Total Bilirubin 0.5 0.0 - 1.2 mg/dL   GFR, Estimated 37 (L) >60 mL/min   Anion gap 11 5 - 15  CBC     Status: Abnormal   Collection Time: 02/06/23  8:09 AM  Result Value Ref Range   WBC 15.4 (H) 4.0 - 10.5 K/uL   RBC 3.25 (L) 3.87 - 5.11 MIL/uL   Hemoglobin 7.1 (L) 12.0 - 15.0 g/dL   HCT 75.7 (L) 63.9 - 53.9 %   MCV 74.5 (L) 80.0 - 100.0 fL   MCH 21.8 (L) 26.0 - 34.0 pg   MCHC 29.3 (L) 30.0 - 36.0 g/dL   RDW 76.1 (H) 88.4 - 84.4 %   Platelets 644 (H) 150 - 400 K/uL   nRBC 0.0 0.0 - 0.2 %  Prepare RBC (crossmatch)     Status: None   Collection Time: 02/06/23  9:35 AM  Result Value Ref Range   Order Confirmation      ORDER PROCESSED BY BLOOD BANK Performed at [facility], [address], [place], [place] [address]   Glucose, capillary     Status: Abnormal   Collection Time: 02/06/23 12:45 PM  Result Value Ref Range   Glucose-Capillary 127 (H) 70 - 99 mg/dL     I have reviewed pertinent nursing notes, vitals, labs, and images as necessary. I have ordered labwork to follow up on as indicated.  I have reviewed the last notes from staff over past 24 hours. I have discussed patient's care plan and test results with nursing staff, CM/SW, and other staff as appropriate.  Time spent: Greater than 50% of the 55 minute visit was spent in counseling/coordination of care for the patient as laid out in the A&P.   LOS: 1 day   Alm Apo, MD [facility] 02/06/2023, 1:00 PM

## 2023-02-06 NOTE — Progress Notes (Signed)
 PT Cancellation Note  Patient Details Name: Jennifer [name] MRN: [id number] DOB: 11/03/1980   Cancelled Treatment:    Reason Eval/Treat Not Completed: Other (comment)  1st attempt at evaluation this morning, patient being cleaned by techs. 2nd attempt, patient just had blood transfusion started.  Will check back this afternoon for comprehensive evaluation.  Jennifer [name], PT, DPT [facility]  Rehabilitation Services Physical Therapist Office: [phone] Website: [email or web address]   Jennifer [name] 02/06/2023, 10:56 AM

## 2023-02-06 NOTE — Evaluation (Signed)
 Occupational Therapy Evaluation Patient Details Name: Jennifer [name] MRN: [id number] DOB: 1980/12/26 Today's Date: 02/06/2023   History of Present Illness Pt is a 43 y.o. 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   Clinical Impression   Pt admitted for above, on eval she was pain limited but reports it as being much better than before following the surgery. Pt initially needing min A +2 to stand then progress to CGA by end of session, currently needing mod A to setup for ADLs. Anticipate that as pt pain improves so will her functional performance. OT to continue following pt acutely to help transition to next level of care. Recommend pt return to AIR in efforts to reach mod I level.        If plan is discharge home, recommend the following: A little help with bathing/dressing/bathroom;Assistance with cooking/housework;Assist for transportation;Help with stairs or ramp for entrance    Functional Status Assessment  Patient has had a recent decline in their functional status and demonstrates the ability to make significant improvements in function in a reasonable and predictable amount of time.  Equipment Recommendations  BSC/3in1;Wheelchair (measurements OT);Wheelchair cushion (measurements OT)    Recommendations for Other Services Rehab consult     Precautions / Restrictions Precautions Precautions: Fall Restrictions RLE Weight Bearing Per Provider Order: Non weight bearing Other Position/Activity Restrictions: s/p R AKA      Mobility Bed Mobility Overal bed mobility: Needs Assistance Bed Mobility: Supine to Sit, Sit to Supine     Supine to sit: Min assist Sit to supine: Min assist   General bed mobility comments: assist with repositoining and LEs, min to mod A with use of pad to scoot to EOB     Transfers Overall transfer level: Needs assistance Equipment used: Rolling walker (2 wheels) Transfers: Sit to/from Stand, Bed to chair/wheelchair/BSC Sit to Stand: Min assist, +2 safety/equipment, +2 physical assistance Stand pivot transfers: Contact guard assist, +2 safety/equipment         General transfer comment: STS min A +2 from EOB, STS from [facility] CGA,      Balance Overall balance assessment: Needs assistance Sitting-balance support: Feet supported Sitting balance-Leahy Scale: Good Sitting balance - Comments: sitting EOB   Standing balance support: Bilateral upper extremity supported, During functional activity, Reliant on assistive device for balance Standing balance-Leahy Scale: Poor Standing balance comment: RW                           ADL either performed or assessed with clinical judgement   ADL Overall ADL's : Needs assistance/impaired Eating/Feeding: Independent;Sitting   Grooming: Sitting;Set up   Upper Body Bathing: Sitting;Set up   Lower Body Bathing: Sitting/lateral leans;Minimal assistance       Lower Body Dressing: Bed level;Moderate assistance   Toilet Transfer: Contact guard assist;+2 for safety/equipment;Rolling walker (2 wheels);BSC/3in1;Stand-pivot   Toileting- Clothing Manipulation and Hygiene: Sitting/lateral lean;Set up       Functional mobility during ADLs: Contact guard assist;Rolling walker (2 wheels) (pivot t/f) General ADL Comments: Reinforced continued ROM and exercises with R AKA     Vision         Perception         Praxis         Pertinent Vitals/Pain Pain Assessment Pain Assessment: Faces Faces  Pain Scale: Hurts even more Pain Location: R AKA Pain Descriptors / Indicators: Discomfort, Grimacing, Moaning, Aching Pain Intervention(s): Limited activity within patient's tolerance, Monitored during session, Repositioned     Extremity/Trunk Assessment Upper Extremity Assessment Upper Extremity  Assessment: Overall WFL for tasks assessed   Lower Extremity Assessment RLE Deficits / Details: s/p R AKA, able to raise from bed.       Communication Communication Communication: No apparent difficulties   Cognition Arousal: Alert Behavior During Therapy: WFL for tasks assessed/performed Overall Cognitive Status: Within Functional Limits for tasks assessed                                       General Comments  PT educated pt on tactile input to reduce phantom pain/sensations    Exercises     Shoulder Instructions      Home Living Family/patient expects to be discharged to:: Inpatient rehab Living Arrangements: Children;Spouse/significant other Available Help at Discharge: Family;Available PRN/intermittently                                    Prior Functioning/Environment                          OT Problem List: Decreased activity tolerance;Impaired balance (sitting and/or standing);Pain      OT Treatment/Interventions: Self-care/ADL training;Therapeutic exercise;Therapeutic activities;Energy conservation;DME and/or AE instruction;Patient/family education;Balance training    OT Goals(Current goals can be found in the care plan section) Acute Rehab OT Goals Patient Stated Goal: To go back to rehab OT Goal Formulation: With patient Time For Goal Achievement: 02/20/23 Potential to Achieve Goals: Good ADL Goals Pt Will Perform Lower Body Bathing: bed level;with set-up Pt Will Perform Lower Body Dressing: bed level;with modified independence Pt Will Transfer to Toilet: with modified independence;bedside commode;stand pivot transfer  OT Frequency: Min 1X/week    Co-evaluation PT/OT/SLP Co-Evaluation/Treatment: Yes Reason for Co-Treatment: Complexity of the patient's impairments (multi-system involvement);To address functional/ADL transfers PT goals addressed during session: Mobility/safety with mobility;Balance;Proper use of  DME OT goals addressed during session: ADL's and self-care;Strengthening/ROM      AM-PAC OT 6 Clicks Daily Activity     Outcome Measure Help from another person eating meals?: None Help from another person taking care of personal grooming?: A Little Help from another person toileting, which includes using toliet, bedpan, or urinal?: A Little Help from another person bathing (including washing, rinsing, drying)?: A Little Help from another person to put on and taking off regular upper body clothing?: A Little Help from another person to put on and taking off regular lower body clothing?: A Lot 6 Click Score: 18   End of Session Equipment Utilized During Treatment: Rolling walker (2 wheels);Gait belt Nurse Communication: Mobility status  Activity Tolerance: Patient tolerated treatment well Patient left: in bed;with call bell/phone within reach  OT Visit Diagnosis: Pain;Other abnormalities of gait and mobility (R26.89);Unsteadiness on feet (R26.81) Pain - Right/Left: Right Pain - part of body: Leg (AKA site)                Time: 8791-8758 OT Time Calculation (min): 33 min Charges:  OT General Charges $OT Visit: 1 Visit OT Evaluation $OT Eval Moderate Complexity: 1 Mod  02/06/2023  AB, OTR/L  Acute Rehabilitation Services  Office: [phone]   Jennifer [name]  Gianpaolo Mindel 02/06/2023, 1:53 PM

## 2023-02-06 NOTE — Plan of Care (Signed)
  Problem: Education: Goal: Knowledge of General Education information will improve Description: Including pain rating scale, medication(s)/side effects and non-pharmacologic comfort measures Outcome: Progressing   Problem: Health Behavior/Discharge Planning: Goal: Ability to manage health-related needs will improve Outcome: Progressing   Problem: Clinical Measurements: Goal: Ability to maintain clinical measurements within normal limits will improve Outcome: Progressing Goal: Will remain free from infection Outcome: Progressing Goal: Diagnostic test results will improve Outcome: Progressing Goal: Respiratory complications will improve Outcome: Progressing Goal: Cardiovascular complication will be avoided Outcome: Progressing   Problem: Nutrition: Goal: Adequate nutrition will be maintained Outcome: Progressing   Problem: Coping: Goal: Level of anxiety will decrease Outcome: Progressing   Problem: Elimination: Goal: Will not experience complications related to bowel motility Outcome: Progressing Goal: Will not experience complications related to urinary retention Outcome: Progressing   Problem: Safety: Goal: Ability to remain free from injury will improve Outcome: Progressing   Problem: Skin Integrity: Goal: Risk for impaired skin integrity will decrease Outcome: Progressing   Problem: Activity: Goal: Risk for activity intolerance will decrease Outcome: Not Progressing   Problem: Pain Management: Goal: General experience of comfort will improve Outcome: Not Progressing   Patient's pain has been uncontrolled with current PRN and scheduled pain med. MD was notified.

## 2023-02-06 NOTE — Hospital Course (Addendum)
 Jennifer [name] is a 43 y.o. 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   Patient initially seen at [facility] from 12/12 until 12/20 with right lower extremity ischemia underwent right lower extremity popliteal stenting and right BKA.  While there was noted to have A-fib and was started on anticoagulation with close monitoring in the setting of history of menorrhagia.  Also noted to have labile diabetes.   Ultimately discharged to CIR on 12/20 where she remained until 1/2.  While there she had continued leg pain and had issues with the operative site including worsening ischemic changes.  Vascular surgery followed and AKA was ultimately performed 02/05/23.

## 2023-02-06 NOTE — Progress Notes (Signed)
 Occupational Therapy Discharge Note  This patient was unable to complete the inpatient rehab program due to medical procedure for her residual limb; therefore did not meet their long term goals. Pt left the program at a contact guard to supervision assist level for their  functional ADLs. This patient is being discharged from OT services at this time.  BIMS at time of d/c  Pt unable to complete due to medical status  See CareTool for functional status details.  If the patient is able to return to inpatient rehabilitation within 3 midnights, this may be considered an interrupted stay and therapy services will resume as ordered. Modification and reinstatement of their goals will be made upon completion of therapy service reevaluations.

## 2023-02-07 LAB — CBC WITH DIFFERENTIAL/PLATELET
Abs Immature Granulocytes: 0 10*3/uL (ref 0.00–0.07)
Basophils Absolute: 0 10*3/uL (ref 0.0–0.1)
Basophils Relative: 0 %
Eosinophils Absolute: 0.6 10*3/uL — ABNORMAL HIGH (ref 0.0–0.5)
Eosinophils Relative: 5 %
HCT: 24.8 % — ABNORMAL LOW (ref 36.0–46.0)
Hemoglobin: 7.5 g/dL — ABNORMAL LOW (ref 12.0–15.0)
Lymphocytes Relative: 18 %
Lymphs Abs: 2 10*3/uL (ref 0.7–4.0)
MCH: 22.6 pg — ABNORMAL LOW (ref 26.0–34.0)
MCHC: 30.2 g/dL (ref 30.0–36.0)
MCV: 74.7 fL — ABNORMAL LOW (ref 80.0–100.0)
Monocytes Absolute: 0.5 10*3/uL (ref 0.1–1.0)
Monocytes Relative: 4 %
Neutro Abs: 8.2 10*3/uL — ABNORMAL HIGH (ref 1.7–7.7)
Neutrophils Relative %: 73 %
Platelets: 535 10*3/uL — ABNORMAL HIGH (ref 150–400)
RBC: 3.32 MIL/uL — ABNORMAL LOW (ref 3.87–5.11)
RDW: 23.2 % — ABNORMAL HIGH (ref 11.5–15.5)
WBC: 11.3 10*3/uL — ABNORMAL HIGH (ref 4.0–10.5)
nRBC: 0 % (ref 0.0–0.2)
nRBC: 0 /100{WBCs}

## 2023-02-07 LAB — BPAM RBC
Blood Product Expiration Date: 202501122359
ISSUE DATE / TIME: 202501031035
Unit Type and Rh: 7300

## 2023-02-07 LAB — BASIC METABOLIC PANEL
Anion gap: 11 (ref 5–15)
BUN: 25 mg/dL — ABNORMAL HIGH (ref 6–20)
CO2: 23 mmol/L (ref 22–32)
Calcium: 9.2 mg/dL (ref 8.9–10.3)
Chloride: 103 mmol/L (ref 98–111)
Creatinine, Ser: 1.54 mg/dL — ABNORMAL HIGH (ref 0.44–1.00)
GFR, Estimated: 42 mL/min — ABNORMAL LOW (ref >60)
Glucose, Bld: 105 mg/dL — ABNORMAL HIGH (ref 70–99)
Potassium: 4.9 mmol/L (ref 3.5–5.1)
Sodium: 137 mmol/L (ref 135–145)

## 2023-02-07 LAB — GLUCOSE, CAPILLARY
Glucose-Capillary: 106 mg/dL — ABNORMAL HIGH (ref 70–99)
Glucose-Capillary: 120 mg/dL — ABNORMAL HIGH (ref 70–99)
Glucose-Capillary: 161 mg/dL — ABNORMAL HIGH (ref 70–99)

## 2023-02-07 LAB — TYPE AND SCREEN
ABO/RH(D): B POS
Antibody Screen: NEGATIVE
Unit division: 0

## 2023-02-07 LAB — MAGNESIUM: Magnesium: 1.9 mg/dL (ref 1.7–2.4)

## 2023-02-07 NOTE — Progress Notes (Signed)

## 2023-02-07 NOTE — Progress Notes (Signed)
  Progress Note    02/07/2023 7:20 AM 2 Days Post-Op  Subjective:  pain improved  Vitals:   02/06/23 1919 02/07/23 0354  BP: 130/77 114/69  Pulse: 99 99  Resp: 17 18  Temp: 99.5 F (37.5 C) 99.2 F (37.3 C)  SpO2: 95% 95%    Physical Exam: Incisions:  vac with stump sock in place on suction without leak   CBC    Component Value Date/Time   WBC 15.4 (H) 02/06/2023 0809   RBC 3.25 (L) 02/06/2023 0809   HGB 7.7 (L) 02/06/2023 1410   HGB 12.0 06/01/2013 1805   HCT 26.1 (L) 02/06/2023 1410   HCT 39.4 06/01/2013 1805   PLT 644 (H) 02/06/2023 0809   PLT 325 06/01/2013 1805   MCV 74.5 (L) 02/06/2023 0809   MCV 71 (L) 06/01/2013 1805   MCH 21.8 (L) 02/06/2023 0809   MCHC 29.3 (L) 02/06/2023 0809   RDW 23.8 (H) 02/06/2023 0809   RDW 18.1 (H) 06/01/2013 1805   LYMPHSABS 2.8 01/29/2023 0541   MONOABS 1.0 01/29/2023 0541   EOSABS 0.5 01/29/2023 0541   BASOSABS 0.1 01/29/2023 0541    BMET    Component Value Date/Time   NA 134 (L) 02/06/2023 0809   NA 135 (L) 06/01/2013 1805   K 5.8 (H) 02/06/2023 0809   K 3.6 06/01/2013 1805   CL 102 02/06/2023 0809   CL 103 06/01/2013 1805   CO2 21 (L) 02/06/2023 0809   CO2 25 06/01/2013 1805   GLUCOSE 121 (H) 02/06/2023 0809   GLUCOSE 179 (H) 06/01/2013 1805   BUN 26 (H) 02/06/2023 0809   BUN 11 06/01/2013 1805   CREATININE 1.73 (H) 02/06/2023 0809   CREATININE 0.79 06/01/2013 1805   CALCIUM  9.4 02/06/2023 0809   CALCIUM  9.1 06/01/2013 1805   GFRNONAA 37 (L) 02/06/2023 0809   GFRNONAA >60 06/01/2013 1805   GFRAA >60 10/08/2016 1820   GFRAA >60 06/01/2013 1805    INR    Component Value Date/Time   INR 1.0 01/15/2023 0925     Intake/Output Summary (Last 24 hours) at 02/07/2023 0720 Last data filed at 02/06/2023 1600 Gross per 24 hour  Intake 1300 ml  Output 700 ml  Net 600 ml     Assessment/Plan:  43 y.o. female is s/p right above knee amputation   2 Days Post-Op  -has incisional vac with stump sock in place.   Will leave in place 5-7 days.   -OK for DC from surgical perspective -she will f/u in our office in 4-5 weeks for staple removal.  Our office will arrange appt.    Norman GORMAN Serve MD [facility] Phone Number: [phone] 02/07/2023 7:20 AM

## 2023-02-07 NOTE — Progress Notes (Signed)
 Progress Note    Jennifer [name]   FMW:[id number]  DOB: Sep 13, 1980  DOA: 02/05/2023     2 PCP: [facility]  Initial CC: R stump wound  Hospital Course: Jennifer [name] is a 43 y.o. 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   Patient initially seen at [facility] from 12/12 until 12/20 with right lower extremity ischemia underwent right lower extremity popliteal stenting and right BKA.  While there was noted to have A-fib and was started on anticoagulation with close monitoring in the setting of history of menorrhagia.  Also noted to have labile diabetes.   Ultimately discharged to CIR on 12/20 where she remained until 1/2.  While there she had continued leg pain and had issues with the operative site including worsening ischemic changes.  Vascular surgery followed and AKA was ultimately performed 02/05/23.    Patient still drowsy after surgery and unable to fully participate in review of systems.  Interval History:  Resting comfortably in bed this morning.  No concerns or questions. Less drowsy than yesterday.  Assessment and Plan:  RLE limb ischemia - As per HPI patient initially seen at [facility] in December and underwent BKA and popliteal stenting of the right lower extremity.  Had been at inpatient rehab but had continued blistering and ischemic changes at her BKA site and ultimately had AKA performed, 02/05/23.  No operative complications reported - WV and stump sock in place for 5-7 days per vascular - follow up in 4-5 weeks for staple removal; office to arrange follow up  - Patient does have some history with perioperative bleeding and menorrhagia so we will monitor closely -Anticipating return to CIR once accepted back  - Appreciate vascular surgery recommendations and assistance -  Continue with scheduled MS Contin  - Continue with as needed tramadol  for moderate pain and as needed p.o. Dilaudid  for severe pain - PRN dilaudid   - Supportive care - PT/OT eval and treat   Hypertension - Continue home diltiazem    Atrial fibrillation - Continue home diltiazem  -Was on Eliquis  from 01/18/2023 until 01/29/2023 then started on heparin  until surgery - will resume Eliquis  once Hgb stabilizes   Anemia Menorrhagia > Known history of vaginal bleeding in the setting fibroids and chronic anemia related to this > Hemoglobin has recently been around 8.  - Monitoring telemetry overnight as above - Trend CBC - Transfuse hemoglobin less than 7 - Continue with iron  supplementation   Obesity - Noted   OSA - Continue CPAP   Old records reviewed in assessment of this patient  Antimicrobials:   DVT prophylaxis:  SCDs Start: 02/05/23 1435   Code Status:   Code Status: Full Code  Mobility Assessment (Last 72 Hours)     Mobility Assessment     Row Name 02/07/23 0800 02/06/23 2213 02/06/23 1500 02/06/23 1350 02/06/23 0800   Does patient have an order for bedrest or is patient medically unstable No - Continue assessment No - Continue assessment -- -- No - Continue assessment   What is the highest level of mobility based on the progressive mobility assessment? Level 3 (Stands with assist) - Balance while standing  and cannot march in place Level 2 (Chairfast) - Balance while sitting on edge of bed and cannot stand Level 3 (Stands with assist) - Balance while standing  and cannot march in place Level 3 (Stands with  assist) - Balance while standing  and cannot march in place Level 3 (Stands with assist) - Balance while standing  and cannot march in place   Is the above level different from baseline mobility prior to current illness? -- Yes - Recommend PT order -- -- --    Row Name 02/05/23 1950 02/05/23 1700         Does patient have an order for bedrest or is patient  medically unstable No - Continue assessment No - Continue assessment      What is the highest level of mobility based on the progressive mobility assessment? Level 1 (Bedfast) - Unable to balance while sitting on edge of bed Level 3 (Stands with assist) - Balance while standing  and cannot march in place      Is the above level different from baseline mobility prior to current illness? Yes - Recommend PT order --               Barriers to discharge: none Disposition Plan:  ?CIR HH orders placed: n/a Status is: Inpt  Objective: Blood pressure 117/71, pulse 97, temperature 98.9 F (37.2 C), resp. rate 18, weight 87.1 kg, SpO2 97%.  Examination:  Physical Exam Constitutional:      General: She is not in acute distress.    Appearance: Normal appearance.     Comments: Drowsy and lethargic  HENT:     Head: Normocephalic and atraumatic.     Mouth/Throat:     Mouth: Mucous membranes are moist.  Eyes:     Extraocular Movements: Extraocular movements intact.  Cardiovascular:     Rate and Rhythm: Normal rate and regular rhythm.  Pulmonary:     Effort: Pulmonary effort is normal. No respiratory distress.     Breath sounds: Normal breath sounds. No wheezing.  Abdominal:     General: Bowel sounds are normal. There is no distension.     Palpations: Abdomen is soft.     Tenderness: There is no abdominal tenderness.  Musculoskeletal:     Cervical back: Normal range of motion and neck supple.     Comments: R AKA noted with WV in place on stump  Skin:    General: Skin is warm and dry.  Neurological:     General: No focal deficit present.     Mental Status: She is alert.  Psychiatric:        Mood and Affect: Mood normal.      Consultants:  Vascular surgery  Procedures:  02/05/2023: Right AKA  Data Reviewed: Results for orders placed or performed during the hospital encounter of 02/05/23 (from the past 24 hours)  Glucose, capillary     Status: Abnormal   Collection Time:  02/06/23 12:45 PM  Result Value Ref Range   Glucose-Capillary 127 (H) 70 - 99 mg/dL  Hemoglobin and hematocrit, blood     Status: Abnormal   Collection Time: 02/06/23  2:10 PM  Result Value Ref Range   Hemoglobin 7.7 (L) 12.0 - 15.0 g/dL   HCT 73.8 (L) 63.9 - 53.9 %  Glucose, capillary     Status: Abnormal   Collection Time: 02/06/23  8:17 PM  Result Value Ref Range   Glucose-Capillary 105 (H) 70 - 99 mg/dL  Basic metabolic panel     Status: Abnormal   Collection Time: 02/07/23  6:47 AM  Result Value Ref Range   Sodium 137 135 - 145 mmol/L   Potassium 4.9 3.5 - 5.1 mmol/L   Chloride 103  98 - 111 mmol/L   CO2 23 22 - 32 mmol/L   Glucose, Bld 105 (H) 70 - 99 mg/dL   BUN 25 (H) 6 - 20 mg/dL   Creatinine, Ser 8.45 (H) 0.44 - 1.00 mg/dL   Calcium  9.2 8.9 - 10.3 mg/dL   GFR, Estimated 42 (L) >60 mL/min   Anion gap 11 5 - 15  CBC with Differential/Platelet     Status: Abnormal   Collection Time: 02/07/23  6:47 AM  Result Value Ref Range   WBC 11.3 (H) 4.0 - 10.5 K/uL   RBC 3.32 (L) 3.87 - 5.11 MIL/uL   Hemoglobin 7.5 (L) 12.0 - 15.0 g/dL   HCT 75.1 (L) 63.9 - 53.9 %   MCV 74.7 (L) 80.0 - 100.0 fL   MCH 22.6 (L) 26.0 - 34.0 pg   MCHC 30.2 30.0 - 36.0 g/dL   RDW 76.7 (H) 88.4 - 84.4 %   Platelets 535 (H) 150 - 400 K/uL   nRBC 0.0 0.0 - 0.2 %   Neutrophils Relative % 73 %   Neutro Abs 8.2 (H) 1.7 - 7.7 K/uL   Lymphocytes Relative 18 %   Lymphs Abs 2.0 0.7 - 4.0 K/uL   Monocytes Relative 4 %   Monocytes Absolute 0.5 0.1 - 1.0 K/uL   Eosinophils Relative 5 %   Eosinophils Absolute 0.6 (H) 0.0 - 0.5 K/uL   Basophils Relative 0 %   Basophils Absolute 0.0 0.0 - 0.1 K/uL   nRBC 0 0 /100 WBC   Abs Immature Granulocytes 0.00 0.00 - 0.07 K/uL   Polychromasia PRESENT   Magnesium      Status: None   Collection Time: 02/07/23  6:47 AM  Result Value Ref Range   Magnesium  1.9 1.7 - 2.4 mg/dL  Glucose, capillary     Status: Abnormal   Collection Time: 02/07/23  8:31 AM  Result Value  Ref Range   Glucose-Capillary 106 (H) 70 - 99 mg/dL  Glucose, capillary     Status: Abnormal   Collection Time: 02/07/23 12:31 PM  Result Value Ref Range   Glucose-Capillary 120 (H) 70 - 99 mg/dL    I have reviewed pertinent nursing notes, vitals, labs, and images as necessary. I have ordered labwork to follow up on as indicated.  I have reviewed the last notes from staff over past 24 hours. I have discussed patient's care plan and test results with nursing staff, CM/SW, and other staff as appropriate.  Time spent: Greater than 50% of the 55 minute visit was spent in counseling/coordination of care for the patient as laid out in the A&P.   LOS: 2 days   Alm Apo, MD [facility] 02/07/2023, 12:43 PM

## 2023-02-08 LAB — CBC WITH DIFFERENTIAL/PLATELET
Abs Immature Granulocytes: 0.06 10*3/uL (ref 0.00–0.07)
Basophils Absolute: 0 10*3/uL (ref 0.0–0.1)
Basophils Relative: 0 %
Eosinophils Absolute: 0.5 10*3/uL (ref 0.0–0.5)
Eosinophils Relative: 4 %
HCT: 28.1 % — ABNORMAL LOW (ref 36.0–46.0)
Hemoglobin: 8.1 g/dL — ABNORMAL LOW (ref 12.0–15.0)
Immature Granulocytes: 1 %
Lymphocytes Relative: 27 %
Lymphs Abs: 2.9 10*3/uL (ref 0.7–4.0)
MCH: 22.4 pg — ABNORMAL LOW (ref 26.0–34.0)
MCHC: 28.8 g/dL — ABNORMAL LOW (ref 30.0–36.0)
MCV: 77.8 fL — ABNORMAL LOW (ref 80.0–100.0)
Monocytes Absolute: 1.1 10*3/uL — ABNORMAL HIGH (ref 0.1–1.0)
Monocytes Relative: 10 %
Neutro Abs: 6.1 10*3/uL (ref 1.7–7.7)
Neutrophils Relative %: 58 %
Platelets: 539 10*3/uL — ABNORMAL HIGH (ref 150–400)
RBC: 3.61 MIL/uL — ABNORMAL LOW (ref 3.87–5.11)
RDW: 23 % — ABNORMAL HIGH (ref 11.5–15.5)
Smear Review: NORMAL
WBC: 10.7 10*3/uL — ABNORMAL HIGH (ref 4.0–10.5)
nRBC: 0 % (ref 0.0–0.2)

## 2023-02-08 LAB — BASIC METABOLIC PANEL
Anion gap: 13 (ref 5–15)
BUN: 19 mg/dL (ref 6–20)
CO2: 21 mmol/L — ABNORMAL LOW (ref 22–32)
Calcium: 9.6 mg/dL (ref 8.9–10.3)
Chloride: 101 mmol/L (ref 98–111)
Creatinine, Ser: 1.34 mg/dL — ABNORMAL HIGH (ref 0.44–1.00)
GFR, Estimated: 50 mL/min — ABNORMAL LOW (ref >60)
Glucose, Bld: 123 mg/dL — ABNORMAL HIGH (ref 70–99)
Potassium: 4.7 mmol/L (ref 3.5–5.1)
Sodium: 135 mmol/L (ref 135–145)

## 2023-02-08 LAB — GLUCOSE, CAPILLARY
Glucose-Capillary: 131 mg/dL — ABNORMAL HIGH (ref 70–99)
Glucose-Capillary: 125 mg/dL — ABNORMAL HIGH (ref 70–99)

## 2023-02-08 LAB — MAGNESIUM: Magnesium: 1.9 mg/dL (ref 1.7–2.4)

## 2023-02-08 MED ORDER — HYDROMORPHONE HCL 1 MG/ML IJ SOLN
1.0000 mg | INTRAMUSCULAR | Status: DC | PRN
  Administered 2023-02-08 – 2023-02-09 (×8): 1 mg via INTRAVENOUS
  Filled 2023-02-08 (×8): qty 1

## 2023-02-08 NOTE — Progress Notes (Signed)
 Progress Note    Jennifer [name]   FMW:[id number]  DOB: 1980-07-01  DOA: 02/05/2023     3 PCP: [name]  Initial CC: R stump wound  Hospital Course: Jennifer [name] is a 43 y.o. 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   Patient initially seen at [facility] from 12/12 until 12/20 with right lower extremity ischemia underwent right lower extremity popliteal stenting and right BKA.  While there was noted to have A-fib and was started on anticoagulation with close monitoring in the setting of history of menorrhagia.  Also noted to have labile diabetes.   Ultimately discharged to CIR on 12/20 where she remained until 1/2.  While there she had continued leg pain and had issues with the operative site including worsening ischemic changes.  Vascular surgery followed and AKA was ultimately performed 02/05/23.    Patient still drowsy after surgery and unable to fully participate in review of systems.  Interval History:  Resting comfortably in bed this morning.  No concerns or questions.  Pain slowly improving each day.  Assessment and Plan:  RLE limb ischemia - As per HPI patient initially seen at [facility] in December and underwent BKA and popliteal stenting of the right lower extremity.  Had been at inpatient rehab but had continued blistering and ischemic changes at her BKA site and ultimately had AKA performed, 02/05/23.  No operative complications reported - WV and stump sock in place for 5-7 days per vascular - follow up in 4-5 weeks for staple removal; office to arrange follow up  - Patient does have some history with perioperative bleeding and menorrhagia so we will monitor closely -Anticipating return to CIR once accepted back  - Appreciate vascular surgery recommendations and assistance -  Continue with scheduled MS Contin  - Continue with as needed tramadol  for moderate pain and as needed p.o. Dilaudid  for severe pain - PRN dilaudid   - Supportive care - PT/OT eval and treat   Hypertension - Continue home diltiazem    Atrial fibrillation - Continue home diltiazem  -Was on Eliquis  from 01/18/2023 until 01/29/2023 then started on heparin  until surgery - will resume Eliquis  once Hgb stabilizes; hemoglobin better today, if remains stable 1/6, will resume Eliquis    Anemia Menorrhagia > Known history of vaginal bleeding in the setting fibroids and chronic anemia related to this > Hemoglobin has recently been around 8.  - Monitoring telemetry overnight as above - Trend CBC - Transfuse hemoglobin less than 7 - Continue with iron  supplementation   Obesity - Noted   OSA - Continue CPAP   Old records reviewed in assessment of this patient  Antimicrobials:   DVT prophylaxis:  SCDs Start: 02/05/23 1435   Code Status:   Code Status: Full Code  Mobility Assessment (Last 72 Hours)     Mobility Assessment     Row Name 02/08/23 0900 02/07/23 2045 02/07/23 1920 02/07/23 0800 02/06/23 2213   Does patient have an order for bedrest or is patient medically unstable No - Continue assessment No - Continue assessment No - Continue assessment No - Continue assessment No - Continue assessment   What is the highest level of mobility based on the progressive mobility assessment? Level 3 (Stands with assist) - Balance while standing  and cannot march in place Level 3 (Stands with assist) - Balance while standing  and cannot march in place  Level 3 (Stands with assist) - Balance while standing  and cannot march in place Level 3 (Stands with assist) - Balance while standing  and cannot march in place Level 2 (Chairfast) - Balance while sitting on edge of bed and cannot stand   Is the above level different from baseline mobility prior to current illness? Yes - Recommend PT order Yes -  Recommend PT order Yes - Recommend PT order -- Yes - Recommend PT order    Row Name 02/06/23 1500 02/06/23 1350 02/06/23 0800 02/05/23 1950 02/05/23 1700   Does patient have an order for bedrest or is patient medically unstable -- -- No - Continue assessment No - Continue assessment No - Continue assessment   What is the highest level of mobility based on the progressive mobility assessment? Level 3 (Stands with assist) - Balance while standing  and cannot march in place Level 3 (Stands with assist) - Balance while standing  and cannot march in place Level 3 (Stands with assist) - Balance while standing  and cannot march in place Level 1 (Bedfast) - Unable to balance while sitting on edge of bed Level 3 (Stands with assist) - Balance while standing  and cannot march in place   Is the above level different from baseline mobility prior to current illness? -- -- -- Yes - Recommend PT order --            Barriers to discharge: none Disposition Plan:  ?CIR HH orders placed: n/a Status is: Inpt  Objective: Blood pressure (!) 140/80, pulse 98, temperature 97.8 F (36.6 C), resp. rate 18, weight 86.4 kg, SpO2 97%.  Examination:  Physical Exam Constitutional:      General: She is not in acute distress.    Appearance: Normal appearance.     Comments: Drowsy and lethargic  HENT:     Head: Normocephalic and atraumatic.     Mouth/Throat:     Mouth: Mucous membranes are moist.  Eyes:     Extraocular Movements: Extraocular movements intact.  Cardiovascular:     Rate and Rhythm: Normal rate and regular rhythm.  Pulmonary:     Effort: Pulmonary effort is normal. No respiratory distress.     Breath sounds: Normal breath sounds. No wheezing.  Abdominal:     General: Bowel sounds are normal. There is no distension.     Palpations: Abdomen is soft.     Tenderness: There is no abdominal tenderness.  Musculoskeletal:     Cervical back: Normal range of motion and neck supple.     Comments: R AKA  noted with WV in place on stump  Skin:    General: Skin is warm and dry.  Neurological:     General: No focal deficit present.     Mental Status: She is alert.  Psychiatric:        Mood and Affect: Mood normal.      Consultants:  Vascular surgery  Procedures:  02/05/2023: Right AKA  Data Reviewed: Results for orders placed or performed during the hospital encounter of 02/05/23 (from the past 24 hours)  Glucose, capillary     Status: Abnormal   Collection Time: 02/07/23 12:31 PM  Result Value Ref Range   Glucose-Capillary 120 (H) 70 - 99 mg/dL  Glucose, capillary     Status: Abnormal   Collection Time: 02/07/23  9:22 PM  Result Value Ref Range   Glucose-Capillary 161 (H) 70 - 99 mg/dL  Basic metabolic panel     Status:  Abnormal   Collection Time: 02/08/23  7:09 AM  Result Value Ref Range   Sodium 135 135 - 145 mmol/L   Potassium 4.7 3.5 - 5.1 mmol/L   Chloride 101 98 - 111 mmol/L   CO2 21 (L) 22 - 32 mmol/L   Glucose, Bld 123 (H) 70 - 99 mg/dL   BUN 19 6 - 20 mg/dL   Creatinine, Ser 8.65 (H) 0.44 - 1.00 mg/dL   Calcium  9.6 8.9 - 10.3 mg/dL   GFR, Estimated 50 (L) >60 mL/min   Anion gap 13 5 - 15  CBC with Differential/Platelet     Status: Abnormal   Collection Time: 02/08/23  7:09 AM  Result Value Ref Range   WBC 10.7 (H) 4.0 - 10.5 K/uL   RBC 3.61 (L) 3.87 - 5.11 MIL/uL   Hemoglobin 8.1 (L) 12.0 - 15.0 g/dL   HCT 71.8 (L) 63.9 - 53.9 %   MCV 77.8 (L) 80.0 - 100.0 fL   MCH 22.4 (L) 26.0 - 34.0 pg   MCHC 28.8 (L) 30.0 - 36.0 g/dL   RDW 76.9 (H) 88.4 - 84.4 %   Platelets 539 (H) 150 - 400 K/uL   nRBC 0.0 0.0 - 0.2 %   Neutrophils Relative % 58 %   Neutro Abs 6.1 1.7 - 7.7 K/uL   Lymphocytes Relative 27 %   Lymphs Abs 2.9 0.7 - 4.0 K/uL   Monocytes Relative 10 %   Monocytes Absolute 1.1 (H) 0.1 - 1.0 K/uL   Eosinophils Relative 4 %   Eosinophils Absolute 0.5 0.0 - 0.5 K/uL   Basophils Relative 0 %   Basophils Absolute 0.0 0.0 - 0.1 K/uL   WBC Morphology  MORPHOLOGY UNREMARKABLE    RBC Morphology See Note    Smear Review Normal platelet morphology    Immature Granulocytes 1 %   Abs Immature Granulocytes 0.06 0.00 - 0.07 K/uL   Polychromasia PRESENT   Magnesium      Status: None   Collection Time: 02/08/23  7:09 AM  Result Value Ref Range   Magnesium  1.9 1.7 - 2.4 mg/dL  Glucose, capillary     Status: Abnormal   Collection Time: 02/08/23  7:25 AM  Result Value Ref Range   Glucose-Capillary 131 (H) 70 - 99 mg/dL    I have reviewed pertinent nursing notes, vitals, labs, and images as necessary. I have ordered labwork to follow up on as indicated.  I have reviewed the last notes from staff over past 24 hours. I have discussed patient's care plan and test results with nursing staff, CM/SW, and other staff as appropriate.  Time spent: Greater than 50% of the 55 minute visit was spent in counseling/coordination of care for the patient as laid out in the A&P.   LOS: 3 days   Alm Apo, MD [facility] 02/08/2023, 11:14 AM

## 2023-02-09 LAB — BASIC METABOLIC PANEL
Anion gap: 12 (ref 5–15)
BUN: 17 mg/dL (ref 6–20)
CO2: 23 mmol/L (ref 22–32)
Calcium: 9.5 mg/dL (ref 8.9–10.3)
Chloride: 100 mmol/L (ref 98–111)
Creatinine, Ser: 1.25 mg/dL — ABNORMAL HIGH (ref 0.44–1.00)
GFR, Estimated: 55 mL/min — ABNORMAL LOW (ref >60)
Glucose, Bld: 124 mg/dL — ABNORMAL HIGH (ref 70–99)
Potassium: 4.4 mmol/L (ref 3.5–5.1)
Sodium: 135 mmol/L (ref 135–145)

## 2023-02-09 LAB — CBC WITH DIFFERENTIAL/PLATELET
Abs Immature Granulocytes: 0.05 10*3/uL (ref 0.00–0.07)
Basophils Absolute: 0 10*3/uL (ref 0.0–0.1)
Basophils Relative: 0 %
Eosinophils Absolute: 0.5 10*3/uL (ref 0.0–0.5)
Eosinophils Relative: 6 %
HCT: 26 % — ABNORMAL LOW (ref 36.0–46.0)
Hemoglobin: 7.7 g/dL — ABNORMAL LOW (ref 12.0–15.0)
Immature Granulocytes: 1 %
Lymphocytes Relative: 27 %
Lymphs Abs: 2.4 10*3/uL (ref 0.7–4.0)
MCH: 22.2 pg — ABNORMAL LOW (ref 26.0–34.0)
MCHC: 29.6 g/dL — ABNORMAL LOW (ref 30.0–36.0)
MCV: 74.9 fL — ABNORMAL LOW (ref 80.0–100.0)
Monocytes Absolute: 0.8 10*3/uL (ref 0.1–1.0)
Monocytes Relative: 9 %
Neutro Abs: 5.3 10*3/uL (ref 1.7–7.7)
Neutrophils Relative %: 57 %
Platelets: 494 10*3/uL — ABNORMAL HIGH (ref 150–400)
RBC: 3.47 MIL/uL — ABNORMAL LOW (ref 3.87–5.11)
RDW: 21.8 % — ABNORMAL HIGH (ref 11.5–15.5)
WBC: 9.1 10*3/uL (ref 4.0–10.5)
nRBC: 0 % (ref 0.0–0.2)

## 2023-02-09 LAB — GLUCOSE, CAPILLARY
Glucose-Capillary: 109 mg/dL — ABNORMAL HIGH (ref 70–99)
Glucose-Capillary: 180 mg/dL — ABNORMAL HIGH (ref 70–99)

## 2023-02-09 LAB — MAGNESIUM: Magnesium: 1.7 mg/dL (ref 1.7–2.4)

## 2023-02-09 LAB — SURGICAL PATHOLOGY

## 2023-02-09 MED ORDER — HYDROMORPHONE HCL 1 MG/ML IJ SOLN
2.0000 mg | INTRAMUSCULAR | Status: DC | PRN
  Administered 2023-02-09 – 2023-02-11 (×15): 2 mg via INTRAVENOUS
  Filled 2023-02-09 (×16): qty 2

## 2023-02-09 NOTE — Progress Notes (Addendum)
  Progress Note    02/09/2023 8:16 AM 4 Days Post-Op  Subjective:  no complaints   Vitals:   02/08/23 1935 02/09/23 0444  BP: 126/81 131/86  Pulse: 88 91  Resp: 18 18  Temp: 99.5 F (37.5 C) 99 F (37.2 C)  SpO2: 98% 96%   Physical Exam: Lungs:  non labored Incisions:  R AKA with compression sock and wound vac in place, good seal Neurologic: A&O  CBC    Component Value Date/Time   WBC 9.1 02/09/2023 0602   RBC 3.47 (L) 02/09/2023 0602   HGB 7.7 (L) 02/09/2023 0602   HGB 12.0 06/01/2013 1805   HCT 26.0 (L) 02/09/2023 0602   HCT 39.4 06/01/2013 1805   PLT 494 (H) 02/09/2023 0602   PLT 325 06/01/2013 1805   MCV 74.9 (L) 02/09/2023 0602   MCV 71 (L) 06/01/2013 1805   MCH 22.2 (L) 02/09/2023 0602   MCHC 29.6 (L) 02/09/2023 0602   RDW 21.8 (H) 02/09/2023 0602   RDW 18.1 (H) 06/01/2013 1805   LYMPHSABS 2.4 02/09/2023 0602   MONOABS 0.8 02/09/2023 0602   EOSABS 0.5 02/09/2023 0602   BASOSABS 0.0 02/09/2023 0602    BMET    Component Value Date/Time   NA 135 02/09/2023 0602   NA 135 (L) 06/01/2013 1805   K 4.4 02/09/2023 0602   K 3.6 06/01/2013 1805   CL 100 02/09/2023 0602   CL 103 06/01/2013 1805   CO2 23 02/09/2023 0602   CO2 25 06/01/2013 1805   GLUCOSE 124 (H) 02/09/2023 0602   GLUCOSE 179 (H) 06/01/2013 1805   BUN 17 02/09/2023 0602   BUN 11 06/01/2013 1805   CREATININE 1.25 (H) 02/09/2023 0602   CREATININE 0.79 06/01/2013 1805   CALCIUM  9.5 02/09/2023 0602   CALCIUM  9.1 06/01/2013 1805   GFRNONAA 55 (L) 02/09/2023 0602   GFRNONAA >60 06/01/2013 1805   GFRAA >60 10/08/2016 1820   GFRAA >60 06/01/2013 1805    INR    Component Value Date/Time   INR 1.0 01/15/2023 0925    No intake or output data in the 24 hours ending 02/09/23 0816   Assessment/Plan:  43 y.o. female is s/p R AKA 4 Days Post-Op   R AKA compression sock and wound vac left in place; these will be removed on 02/12/23.  Encouraged nutrition and mobility.  Ok for [employer] when  approved.   Donnice Sender, PA-C [facility] [phone] 02/09/2023 8:16 AM  VASCULAR STAFF ADDENDUM: I agree with the above.  Will see again 02/11/22.  Debby SAILOR. Magda, MD [facility] Phone Number: [phone] 02/09/2023 10:24 AM

## 2023-02-09 NOTE — Progress Notes (Signed)
 Physical Therapy Treatment Patient Details Name: Jennifer [name] MRN: [id number] DOB: 1980-08-26 Today's Date: 02/09/2023   History of Present Illness Pt is a 43 y.o. 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    PT Comments  Making progress towards acute goals, mostly limited by pain however premedicated prior to session. Also a bit more fatigued today, likely 2/2 low hgb. Reports making an effort to get OOB 3x/day with staff. Encouraged to continue with this and LE exercises + desensitization techniques. Requires CGA for transfer from various surfaces, and able to progress gait 5 feet forward and backwards. Mostly pivoting but able to clear foot from ground to hop a couple of times. Patient will continue to benefit from skilled physical therapy services to further improve independence with functional mobility. Patient will benefit from intensive inpatient follow up therapy, >3 hours/day.    If plan is discharge home, recommend the following: A little help with walking and/or transfers;Assistance with cooking/housework;A little help with bathing/dressing/bathroom;Assist for transportation;Help with stairs or ramp for entrance   Can travel by private vehicle        Equipment Recommendations  Other (comment) (Defer to next level of care)    Recommendations for Other Services Rehab consult     Precautions / Restrictions Precautions Precautions: Fall Restrictions Weight Bearing Restrictions Per Provider Order: Yes RLE Weight Bearing Per Provider Order: Non weight bearing Other Position/Activity Restrictions: s/p R AKA     Mobility  Bed Mobility Overal bed mobility: Needs Assistance Bed Mobility: Supine to Sit     Supine to sit: Supervision Sit to supine: Min assist   General bed mobility comments: Supervision  for safety, no physical assist required.    Transfers Overall transfer level: Needs assistance Equipment used: Rolling walker (2 wheels) Transfers: Sit to/from Stand, Bed to chair/wheelchair/BSC Sit to Stand: Contact guard assist Stand pivot transfers: Contact guard assist Step pivot transfers: Min assist Squat pivot transfers: Contact guard assist     General transfer comment: CGA for transition to stand from bed and recliner. Educated on various techniques pending environmental restraints at d/c. CGA with pivot, unable to hop.    Ambulation/Gait Ambulation/Gait assistance: Contact guard assist Gait Distance (Feet): 5 Feet (+5) Assistive device: Rolling walker (2 wheels) Gait Pattern/deviations: Step-to pattern Gait velocity: decreased Gait velocity interpretation: <1.31 ft/sec, indicative of household ambulator   General Gait Details: Pivots Lt foot forward. Progressed with small hops but limited due to pain with movement. Educated on RW use, proximity and awareness. Pivots backwards, unable to hop.   Stairs             [employer] propulsion: Both upper extremities Wheelchair Assistance Details (indicate cue type and reason): max verbal cues for technique (pivots, propulsion, retro etc). max assist needed to manage WC parts (brakes, arm rest)   Tilt Bed    Modified Rankin (Stroke Patients Only)       Balance Overall balance assessment: Needs assistance Sitting-balance support: Feet supported Sitting balance-Leahy Scale: Good Sitting balance - Comments: sitting EOB   Standing balance support: Bilateral upper extremity supported, During functional activity, Reliant on assistive device for balance Standing balance-Leahy Scale: Poor Standing balance comment: RW  Cognition Arousal: Alert Behavior During Therapy: WFL for tasks assessed/performed Overall Cognitive Status: Within  Functional Limits for tasks assessed                                          Exercises Amputee Exercises Quad Sets: AROM, Strengthening, Right, 5 reps, Seated Gluteal Sets: Both, AROM Hip ABduction/ADduction: AROM, Strengthening, Right, 5 reps, Seated Knee Flexion: AROM, Strengthening, Right, 5 reps, Seated Knee Extension: AROM, Strengthening, Right, 5 reps, Seated Straight Leg Raises: AROM, Left Other Exercises Other Exercises: Encouraged continuation of LE exercises and desensitization techniques which she reports being compliant with.    General Comments        Pertinent Vitals/Pain Pain Assessment Pain Assessment: Faces Faces Pain Scale: Hurts even more Pain Location: Rt residual limb and headache Pain Descriptors / Indicators: Discomfort, Grimacing, Moaning, Aching Pain Intervention(s): Monitored during session, Repositioned    Home Living                          Prior Function            PT Goals (current goals can now be found in the care plan section) Acute Rehab PT Goals Patient Stated Goal: Finish CIR PT Goal Formulation: With patient Time For Goal Achievement: 02/20/23 Potential to Achieve Goals: Good Progress towards PT goals: Progressing toward goals    Frequency    Min 1X/week      PT Plan      Co-evaluation              AM-PAC PT 6 Clicks Mobility   Outcome Measure  Help needed turning from your back to your side while in a flat bed without using bedrails?: A Little Help needed moving from lying on your back to sitting on the side of a flat bed without using bedrails?: A Little Help needed moving to and from a bed to a chair (including a wheelchair)?: A Little Help needed standing up from a chair using your arms (e.g., wheelchair or bedside chair)?: A Little Help needed to walk in hospital room?: A Little Help needed climbing 3-5 steps with a railing? : A Lot 6 Click Score: 17    End of Session  Equipment Utilized During Treatment: Gait belt Activity Tolerance: Patient limited by pain Patient left: with call bell/phone within reach;in chair;with chair alarm set Nurse Communication: Mobility status PT Visit Diagnosis: Difficulty in walking, not elsewhere classified (R26.2);Pain;Other abnormalities of gait and mobility (R26.89);Unsteadiness on feet (R26.81) Pain - Right/Left: Right Pain - part of body: Leg     Time: 8781-8752 PT Time Calculation (min) (ACUTE ONLY): 29 min  Charges:    $Therapeutic Activity: 8-22 mins PT General Charges $$ ACUTE PT VISIT: 1 Visit                     Leontine Roads, PT, DPT [facility]  Rehabilitation Services Physical Therapist Office: [phone] Website: [email or web address]    Leontine GORMAN Roads 02/09/2023, 1:26 PM

## 2023-02-09 NOTE — Plan of Care (Signed)
  Problem: Education: Goal: Knowledge of General Education information will improve Description: Including pain rating scale, medication(s)/side effects and non-pharmacologic comfort measures Outcome: Progressing   Problem: Health Behavior/Discharge Planning: Goal: Ability to manage health-related needs will improve Outcome: Progressing   Problem: Clinical Measurements: Goal: Ability to maintain clinical measurements within normal limits will improve Outcome: Progressing Goal: Will remain free from infection Outcome: Progressing Goal: Diagnostic test results will improve Outcome: Progressing   Problem: Clinical Measurements: Goal: Will remain free from infection Outcome: Progressing

## 2023-02-09 NOTE — Progress Notes (Addendum)
 Inpatient Rehab Admissions Coordinator:   Received consult for CIR. Patient left CIR and is now AKA from BKA. Met with patient and she wants to go back to CIR. She will need updated OT note to open insurance for prior auth to re-admit. Will continue to follow.   Rehab Admissons Coordinator Shamonique Battiste, [place], [place] [phone]

## 2023-02-09 NOTE — Progress Notes (Signed)
 Progress Note    Jennifer [name]   FMW:[id number]  DOB: 1980-09-12  DOA: 02/05/2023     4 PCP: [facility]  Initial CC: R stump wound  Hospital Course: Jennifer [name] is a 43 y.o. 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   Patient initially seen at [facility] from 12/12 until 12/20 with right lower extremity ischemia underwent right lower extremity popliteal stenting and right BKA.  While there was noted to have A-fib and was started on anticoagulation with close monitoring in the setting of history of menorrhagia.  Also noted to have labile diabetes.   Ultimately discharged to CIR on 12/20 where she remained until 1/2.  While there she had continued leg pain and had issues with the operative site including worsening ischemic changes.  Vascular surgery followed and AKA was ultimately performed 02/05/23.    Patient still drowsy after surgery and unable to fully participate in review of systems.  Interval History:  Resting comfortably in bed this morning.  No concerns or questions.  Pain slowly improving each day. Ready for CIR when approved.   Assessment and Plan:  RLE limb ischemia - As per HPI patient initially seen at [facility] in December and underwent BKA and popliteal stenting of the right lower extremity.  Had been at inpatient rehab but had continued blistering and ischemic changes at her BKA site and ultimately had AKA performed, 02/05/23.  No operative complications reported - WV and stump sock in place; planning to be removed per vascular on 02/12/23 - follow up in 4-5 weeks for staple removal; office to arrange follow up  - Patient does have some history with perioperative bleeding and menorrhagia so we will monitor closely -Anticipating return to CIR once accepted back  - Appreciate  vascular surgery recommendations and assistance - Continue with scheduled MS Contin  - Continue with as needed tramadol  for moderate pain and as needed p.o. Dilaudid  for severe pain - PRN dilaudid   - Supportive care - PT/OT eval and treat   Hypertension - Continue home diltiazem    Atrial fibrillation - Continue home diltiazem  -Was on Eliquis  from 01/18/2023 until 01/29/2023 then started on heparin  until surgery - will resume Eliquis  once Hgb stabilizes; would like to see remain >8 g/dL for at least 2 days   Anemia Menorrhagia > Known history of vaginal bleeding in the setting fibroids and chronic anemia related to this > Hemoglobin has recently been around 8.  - Monitoring telemetry overnight as above - Trend CBC - Transfuse hemoglobin less than 7 - Continue with iron  supplementation   Obesity - Noted   OSA - Continue CPAP   Old records reviewed in assessment of this patient  Antimicrobials:   DVT prophylaxis:  SCDs Start: 02/05/23 1435   Code Status:   Code Status: Full Code  Mobility Assessment (Last 72 Hours)     Mobility Assessment     Row Name 02/09/23 0800 02/08/23 2050 02/08/23 0900 02/07/23 2045 02/07/23 1920   Does patient have an order for bedrest or is patient medically unstable No - Continue assessment No - Continue assessment No - Continue assessment No - Continue assessment No - Continue assessment   What is the highest level of mobility based on the progressive mobility assessment? Level 3 (Stands with assist) - Balance while standing  and cannot march in place Level 3 (Stands with  assist) - Balance while standing  and cannot march in place Level 3 (Stands with assist) - Balance while standing  and cannot march in place Level 3 (Stands with assist) - Balance while standing  and cannot march in place Level 3 (Stands with assist) - Balance while standing  and cannot march in place   Is the above level different from baseline mobility prior to current  illness? Yes - Recommend PT order Yes - Recommend PT order Yes - Recommend PT order Yes - Recommend PT order Yes - Recommend PT order    Row Name 02/07/23 0800 02/06/23 2213 02/06/23 1500 02/06/23 1350     Does patient have an order for bedrest or is patient medically unstable No - Continue assessment No - Continue assessment -- --    What is the highest level of mobility based on the progressive mobility assessment? Level 3 (Stands with assist) - Balance while standing  and cannot march in place Level 2 (Chairfast) - Balance while sitting on edge of bed and cannot stand Level 3 (Stands with assist) - Balance while standing  and cannot march in place Level 3 (Stands with assist) - Balance while standing  and cannot march in place    Is the above level different from baseline mobility prior to current illness? -- Yes - Recommend PT order -- --             Barriers to discharge: none Disposition Plan:  ?CIR HH orders placed: n/a Status is: Inpt  Objective: Blood pressure 131/77, pulse 99, temperature 98.2 F (36.8 C), resp. rate 18, weight 86.4 kg, SpO2 97%.  Examination:  Physical Exam Constitutional:      General: She is not in acute distress.    Appearance: Normal appearance.     Comments: Drowsy and lethargic  HENT:     Head: Normocephalic and atraumatic.     Mouth/Throat:     Mouth: Mucous membranes are moist.  Eyes:     Extraocular Movements: Extraocular movements intact.  Cardiovascular:     Rate and Rhythm: Normal rate and regular rhythm.  Pulmonary:     Effort: Pulmonary effort is normal. No respiratory distress.     Breath sounds: Normal breath sounds. No wheezing.  Abdominal:     General: Bowel sounds are normal. There is no distension.     Palpations: Abdomen is soft.     Tenderness: There is no abdominal tenderness.  Musculoskeletal:     Cervical back: Normal range of motion and neck supple.     Comments: R AKA noted with WV in place on stump  Skin:     General: Skin is warm and dry.  Neurological:     General: No focal deficit present.     Mental Status: She is alert.  Psychiatric:        Mood and Affect: Mood normal.      Consultants:  Vascular surgery  Procedures:  02/05/2023: Right AKA  Data Reviewed: Results for orders placed or performed during the hospital encounter of 02/05/23 (from the past 24 hours)  Glucose, capillary     Status: Abnormal   Collection Time: 02/08/23  9:47 PM  Result Value Ref Range   Glucose-Capillary 125 (H) 70 - 99 mg/dL  Basic metabolic panel     Status: Abnormal   Collection Time: 02/09/23  6:02 AM  Result Value Ref Range   Sodium 135 135 - 145 mmol/L   Potassium 4.4 3.5 - 5.1 mmol/L  Chloride 100 98 - 111 mmol/L   CO2 23 22 - 32 mmol/L   Glucose, Bld 124 (H) 70 - 99 mg/dL   BUN 17 6 - 20 mg/dL   Creatinine, Ser 8.74 (H) 0.44 - 1.00 mg/dL   Calcium  9.5 8.9 - 10.3 mg/dL   GFR, Estimated 55 (L) >60 mL/min   Anion gap 12 5 - 15  CBC with Differential/Platelet     Status: Abnormal   Collection Time: 02/09/23  6:02 AM  Result Value Ref Range   WBC 9.1 4.0 - 10.5 K/uL   RBC 3.47 (L) 3.87 - 5.11 MIL/uL   Hemoglobin 7.7 (L) 12.0 - 15.0 g/dL   HCT 73.9 (L) 63.9 - 53.9 %   MCV 74.9 (L) 80.0 - 100.0 fL   MCH 22.2 (L) 26.0 - 34.0 pg   MCHC 29.6 (L) 30.0 - 36.0 g/dL   RDW 78.1 (H) 88.4 - 84.4 %   Platelets 494 (H) 150 - 400 K/uL   nRBC 0.0 0.0 - 0.2 %   Neutrophils Relative % 57 %   Neutro Abs 5.3 1.7 - 7.7 K/uL   Lymphocytes Relative 27 %   Lymphs Abs 2.4 0.7 - 4.0 K/uL   Monocytes Relative 9 %   Monocytes Absolute 0.8 0.1 - 1.0 K/uL   Eosinophils Relative 6 %   Eosinophils Absolute 0.5 0.0 - 0.5 K/uL   Basophils Relative 0 %   Basophils Absolute 0.0 0.0 - 0.1 K/uL   Immature Granulocytes 1 %   Abs Immature Granulocytes 0.05 0.00 - 0.07 K/uL  Magnesium      Status: None   Collection Time: 02/09/23  6:02 AM  Result Value Ref Range   Magnesium  1.7 1.7 - 2.4 mg/dL  Glucose, capillary      Status: Abnormal   Collection Time: 02/09/23  9:30 AM  Result Value Ref Range   Glucose-Capillary 109 (H) 70 - 99 mg/dL    I have reviewed pertinent nursing notes, vitals, labs, and images as necessary. I have ordered labwork to follow up on as indicated.  I have reviewed the last notes from staff over past 24 hours. I have discussed patient's care plan and test results with nursing staff, CM/SW, and other staff as appropriate.    LOS: 4 days   Alm Apo, MD [facility] 02/09/2023, 11:35 AM

## 2023-02-10 DIAGNOSIS — I4891 Unspecified atrial fibrillation: Secondary | ICD-10-CM

## 2023-02-10 LAB — CBC WITH DIFFERENTIAL/PLATELET
Abs Immature Granulocytes: 0 10*3/uL (ref 0.00–0.07)
Basophils Absolute: 0 10*3/uL (ref 0.0–0.1)
Basophils Relative: 0 %
Eosinophils Absolute: 0.4 10*3/uL (ref 0.0–0.5)
Eosinophils Relative: 4 %
HCT: 27.8 % — ABNORMAL LOW (ref 36.0–46.0)
Hemoglobin: 8.2 g/dL — ABNORMAL LOW (ref 12.0–15.0)
Lymphocytes Relative: 25 %
Lymphs Abs: 2.5 10*3/uL (ref 0.7–4.0)
MCH: 22.4 pg — ABNORMAL LOW (ref 26.0–34.0)
MCHC: 29.5 g/dL — ABNORMAL LOW (ref 30.0–36.0)
MCV: 76 fL — ABNORMAL LOW (ref 80.0–100.0)
Monocytes Absolute: 0.8 10*3/uL (ref 0.1–1.0)
Monocytes Relative: 8 %
Neutro Abs: 6.4 10*3/uL (ref 1.7–7.7)
Neutrophils Relative %: 63 %
Platelets: 533 10*3/uL — ABNORMAL HIGH (ref 150–400)
RBC: 3.66 MIL/uL — ABNORMAL LOW (ref 3.87–5.11)
RDW: 21.8 % — ABNORMAL HIGH (ref 11.5–15.5)
WBC: 10.1 10*3/uL (ref 4.0–10.5)
nRBC: 0 % (ref 0.0–0.2)
nRBC: 1 /100{WBCs} — ABNORMAL HIGH

## 2023-02-10 LAB — BASIC METABOLIC PANEL
Anion gap: 14 (ref 5–15)
BUN: 20 mg/dL (ref 6–20)
CO2: 23 mmol/L (ref 22–32)
Calcium: 9.7 mg/dL (ref 8.9–10.3)
Chloride: 98 mmol/L (ref 98–111)
Creatinine, Ser: 1.48 mg/dL — ABNORMAL HIGH (ref 0.44–1.00)
GFR, Estimated: 45 mL/min — ABNORMAL LOW (ref >60)
Glucose, Bld: 149 mg/dL — ABNORMAL HIGH (ref 70–99)
Potassium: 4.8 mmol/L (ref 3.5–5.1)
Sodium: 135 mmol/L (ref 135–145)

## 2023-02-10 LAB — GLUCOSE, CAPILLARY
Glucose-Capillary: 190 mg/dL — ABNORMAL HIGH (ref 70–99)
Glucose-Capillary: 191 mg/dL — ABNORMAL HIGH (ref 70–99)

## 2023-02-10 LAB — MAGNESIUM: Magnesium: 2.1 mg/dL (ref 1.7–2.4)

## 2023-02-10 MED ORDER — APIXABAN 5 MG PO TABS
5.0000 mg | ORAL_TABLET | Freq: Two times a day (BID) | ORAL | Status: DC
  Administered 2023-02-10 – 2023-02-12 (×5): 5 mg via ORAL
  Filled 2023-02-10 (×5): qty 1

## 2023-02-10 MED ORDER — HYDROMORPHONE HCL 1 MG/ML IJ SOLN
2.0000 mg | Freq: Once | INTRAMUSCULAR | Status: AC
  Administered 2023-02-10: 2 mg via INTRAMUSCULAR
  Filled 2023-02-10: qty 2

## 2023-02-10 NOTE — Progress Notes (Signed)
 OT Cancellation Note  Patient Details Name: Jennifer [name] MRN: [id number] DOB: Jan 22, 1981   Cancelled Treatment:    Reason Eval/Treat Not Completed: Pain limiting ability to participate pt greeted in long sitting in bed with nurse present providing pain meds, breakfast tray had also just arrived. Pt requested time to eat and allow pain meds to kick in, will check back as time allows for OT intervention.  Ronal Mallie POUR., COTA/L Acute Rehabilitation Services [phone]   Ronal Mallie Needy 02/10/2023, 8:01 AM

## 2023-02-10 NOTE — Plan of Care (Signed)

## 2023-02-10 NOTE — Progress Notes (Signed)
 Occupational Therapy Treatment Patient Details Name: Jennifer [name] MRN: [id number] DOB: November 09, 1980 Today's Date: 02/10/2023   History of present illness Pt is a 43 y.o. 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   OT comments  Pt making steady progress towards OT goals this session. Pt continues to present with increased pain with pt needing increased time for transitions. Session focused on functional mobility and BADL reeducation. Pt completed stand pivot to EOB<>BSC with RW CGA- MINA. Pt completed pericare in standing with CGA for unilateral support. Pt would continue to benefit from skilled occupational therapy while admitted and after d/c to address the below listed limitations in order to improve overall functional mobility and facilitate independence with BADL participation. DC plan remains appropriate, will follow acutely per POC.         If plan is discharge home, recommend the following:  A little help with bathing/dressing/bathroom;Assistance with cooking/housework;Assist for transportation;Help with stairs or ramp for entrance   Equipment Recommendations  BSC/3in1;Wheelchair (measurements OT);Wheelchair cushion (measurements OT)    Recommendations for Other Services      Precautions / Restrictions Precautions Precautions: Fall Restrictions Weight Bearing Restrictions Per Provider Order: Yes RLE Weight Bearing Per Provider Order: Non weight bearing Other Position/Activity Restrictions: s/p R AKA       Mobility Bed Mobility               General bed mobility comments: pt greeted in long sitting in bed, pt transitioned to EOB to pts L side with increased time and effort as pt limited by pain in RLE, specifically feeling like RLE is cramping MOD verbal cues for technique to transition to fully  sitting EOB and squaring hips to stand from optimal position    Transfers Overall transfer level: Needs assistance Equipment used: Rolling walker (2 wheels) Transfers: Sit to/from Stand, Bed to chair/wheelchair/BSC Sit to Stand: Min assist, From elevated surface Stand pivot transfers: Contact guard assist         General transfer comment: pt able to sit>stand from EOB and BSC with MINA-CGA, increased assist needed d/t pain. pt able to pivot from EOB<>BSC with RW and CGA, pt unable to hop but able to heel/toe pivot to/from surfaces     Balance Overall balance assessment: Needs assistance Sitting-balance support: Feet supported Sitting balance-Leahy Scale: Good Sitting balance - Comments: sitting EOB   Standing balance support: During functional activity, Single extremity supported Standing balance-Leahy Scale: Poor Standing balance comment: pt required unilateral support for ADL participation in standing                           ADL either performed or assessed with clinical judgement   ADL Overall ADL's : Needs assistance/impaired             Lower Body Bathing: Contact guard assist;Sit to/from stand Lower Body Bathing Details (indicate cue type and reason): simulated via LB pericare in standing         Toilet Transfer: Minimal assistance;BSC/3in1;Stand-pivot;Rolling walker (2 wheels);Cueing for safety;Cueing for sequencing Toilet Transfer Details (indicate cue type and reason): stand pivot from EOB<>BSC wtih rw and MIN A d/t increased pain Toileting- Clothing Manipulation and Hygiene: Contact guard assist;Sit to/from stand Toileting - Clothing Manipulation Details (indicate cue type and reason): able to complete anterior  pericare after continent urine void in standing with CGA for balance and unilateral support from RW     Functional mobility during ADLs: Minimal assistance;Rolling walker (2 wheels);Cueing for safety;Cueing for sequencing (stand pivot  only) General ADL Comments: ADL participation greatly limited by pain this session    Extremity/Trunk Assessment Upper Extremity Assessment Upper Extremity Assessment: Overall WFL for tasks assessed   Lower Extremity Assessment Lower Extremity Assessment: RLE deficits/detail RLE Deficits / Details: s/p R AKA RLE: Unable to fully assess due to pain   Cervical / Trunk Assessment Cervical / Trunk Assessment: Normal    Vision Baseline Vision/History: 1 Wears glasses Patient Visual Report: No change from baseline     Perception Perception Perception: Within Functional Limits   Praxis Praxis Praxis: WFL    Cognition Arousal: Alert Behavior During Therapy: WFL for tasks assessed/performed, Lability Overall Cognitive Status: Within Functional Limits for tasks assessed                                 General Comments: tearful about pain and expressing feelings of wanting to do more but being limited by pain        Exercises      Shoulder Instructions       General Comments noted vaginal spotting during session, nurse aware    Pertinent Vitals/ Pain       Pain Assessment Pain Assessment: 0-10 Pain Score: 10-Worst pain ever Pain Location: RLE Pain Descriptors / Indicators: Discomfort, Grimacing, Moaning, Crying, Cramping Pain Intervention(s): Monitored during session, Limited activity within patient's tolerance, Repositioned, Patient requesting pain meds-RN notified  Home Living                                          Prior Functioning/Environment              Frequency  Min 1X/week        Progress Toward Goals  OT Goals(current goals can now be found in the care plan section)  Progress towards OT goals: Progressing toward goals  Acute Rehab OT Goals Patient Stated Goal: to have decreased pain OT Goal Formulation: With patient Time For Goal Achievement: 02/20/23 Potential to Achieve Goals: Good  Plan       Co-evaluation                 AM-PAC OT 6 Clicks Daily Activity     Outcome Measure   Help from another person eating meals?: None Help from another person taking care of personal grooming?: A Little Help from another person toileting, which includes using toliet, bedpan, or urinal?: A Little Help from another person bathing (including washing, rinsing, drying)?: A Little Help from another person to put on and taking off regular upper body clothing?: A Little Help from another person to put on and taking off regular lower body clothing?: A Lot 6 Click Score: 18    End of Session Equipment Utilized During Treatment: Gait belt;Rolling walker (2 wheels)  OT Visit Diagnosis: Pain;Other abnormalities of gait and mobility (R26.89);Unsteadiness on feet (R26.81) Pain - Right/Left: Right Pain - part of body:  (AKA)   Activity Tolerance Patient tolerated treatment well;Patient limited by pain   Patient Left in bed;with call bell/phone within reach;Other (comment) (seated EOB)   Nurse Communication Mobility status;Other (comment) (nurse present for much of  session)        Time: 8691-8655 OT Time Calculation (min): 36 min  Charges: OT Treatments $Self Care/Home Management : 23-37 mins  Ronal Mallie POUR., COTA/L Acute Rehabilitation Services [phone]   Ronal Mallie Needy 02/10/2023, 2:00 PM

## 2023-02-10 NOTE — TOC Progression Note (Signed)
 Transition of Care [facility]) - Progression Note    Patient Details  Name: Jennifer [name] MRN: [id number] Date of Birth: 1980-06-10  [facility] CM/SW Contact  Rosaline JONELLE Joe, RN Phone Number: 02/10/2023, 11:20 AM  Clinical Narrative:    CM will continue to follow the patient for TOC needs.  Patient is expected to return to CIR for therapy needs once medically stable for discharge.  CIR team is continuing to follow the patient.        Expected Discharge Plan and Services                                               Social Determinants of Health (SDOH) Interventions SDOH Screenings   Food Insecurity: Patient Declined (02/06/2023)  Housing: Unknown (02/06/2023)  Transportation Needs: Patient Declined (02/06/2023)  Utilities: Patient Declined (02/06/2023)  Tobacco Use: High Risk (02/05/2023)    Readmission Risk Interventions     No data to display

## 2023-02-10 NOTE — Progress Notes (Signed)
 Progress Note    Jennifer [name]   FMW:[id number]  DOB: Oct 15, 1980  DOA: 02/05/2023     5 PCP: [facility]  Initial CC: R stump wound  Hospital Course: Jennifer [name] is a 43 y.o. 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   Patient initially seen at [facility] from 12/12 until 12/20 with right lower extremity ischemia underwent right lower extremity popliteal stenting and right BKA.  While there was noted to have A-fib and was started on anticoagulation with close monitoring in the setting of history of menorrhagia.  Also noted to have labile diabetes.   Ultimately discharged to CIR on 12/20 where she remained until 1/2.  While there she had continued leg pain and had issues with the operative site including worsening ischemic changes.  Vascular surgery followed and AKA was ultimately performed 02/05/23.  Interval History:  Comfortable when seen in the mornings.  Mostly has phantom pains later in the day but she is tolerating fairly well.  Current pain regimen is adequate she says and no adjustments were requested during conversation this morning. Awaiting CIR approval.  Stable for transfer back to CIR when able.  Assessment and Plan:  RLE limb ischemia - As per HPI patient initially seen at [facility] in December and underwent BKA and popliteal stenting of the right lower extremity.  Had been at inpatient rehab but had continued blistering and ischemic changes at her BKA site and ultimately had AKA performed, 02/05/23.  No operative complications reported - WV and stump sock in place; planning to be removed per vascular on 02/12/23 - follow up in 4-5 weeks for staple removal; office to arrange follow up  - Patient does have some history with perioperative bleeding and menorrhagia so we will monitor  closely -Anticipating return to CIR once accepted back  - Appreciate vascular surgery recommendations and assistance - Continue with scheduled MS Contin  - Continue with as needed tramadol  for moderate pain and as needed p.o. Dilaudid  for severe pain - PRN dilaudid   - Supportive care - PT/OT eval and treat   Hypertension - Continue home diltiazem    Atrial fibrillation - Continue home diltiazem  -Was on Eliquis  from 01/18/2023 until 01/29/2023 then started on heparin  until surgery -Resuming Eliquis  on 02/10/2023.  Monitor hemoglobin for stability after resuming and any signs of bleeding   Anemia Menorrhagia > Known history of vaginal bleeding in the setting fibroids and chronic anemia related to this > Hemoglobin has recently been around 8.  - Monitoring telemetry overnight as above - Trend CBC - Transfuse hemoglobin less than 7 - Continue with iron  supplementation   Obesity - Noted   OSA - Continue CPAP   Old records reviewed in assessment of this patient  Antimicrobials:   DVT prophylaxis:  SCDs Start: 02/05/23 1435 apixaban  (ELIQUIS ) tablet 5 mg   Code Status:   Code Status: Full Code  Mobility Assessment (Last 72 Hours)     Mobility Assessment     Row Name 02/10/23 0803 02/09/23 1955 02/09/23 1300 02/09/23 1200 02/09/23 0800   Does patient have an order for bedrest or is patient medically unstable No - Continue assessment No - Continue assessment -- -- No - Continue assessment   What is the highest level of mobility based on the progressive mobility assessment? Level 3 (Stands with assist) - Balance while standing  and cannot march  in place Level 3 (Stands with assist) - Balance while standing  and cannot march in place Level 3 (Stands with assist) - Balance while standing  and cannot march in place Level 3 (Stands with assist) - Balance while standing  and cannot march in place Level 3 (Stands with assist) - Balance while standing  and cannot march in place   Is the  above level different from baseline mobility prior to current illness? Yes - Recommend PT order Yes - Recommend PT order -- -- Yes - Recommend PT order    Row Name 02/08/23 2050 02/08/23 0900 02/07/23 2045 02/07/23 1920     Does patient have an order for bedrest or is patient medically unstable No - Continue assessment No - Continue assessment No - Continue assessment No - Continue assessment    What is the highest level of mobility based on the progressive mobility assessment? Level 3 (Stands with assist) - Balance while standing  and cannot march in place Level 3 (Stands with assist) - Balance while standing  and cannot march in place Level 3 (Stands with assist) - Balance while standing  and cannot march in place Level 3 (Stands with assist) - Balance while standing  and cannot march in place    Is the above level different from baseline mobility prior to current illness? Yes - Recommend PT order Yes - Recommend PT order Yes - Recommend PT order Yes - Recommend PT order             Barriers to discharge: none Disposition Plan:  ?CIR HH orders placed: n/a Status is: Inpt  Objective: Blood pressure 124/69, pulse 98, temperature 98.6 F (37 C), temperature source Oral, resp. rate 16, weight 86.3 kg, SpO2 98%.  Examination:  Physical Exam Constitutional:      General: She is not in acute distress.    Appearance: Normal appearance.     Comments: Drowsy and lethargic  HENT:     Head: Normocephalic and atraumatic.     Mouth/Throat:     Mouth: Mucous membranes are moist.  Eyes:     Extraocular Movements: Extraocular movements intact.  Cardiovascular:     Rate and Rhythm: Normal rate and regular rhythm.  Pulmonary:     Effort: Pulmonary effort is normal. No respiratory distress.     Breath sounds: Normal breath sounds. No wheezing.  Abdominal:     General: Bowel sounds are normal. There is no distension.     Palpations: Abdomen is soft.     Tenderness: There is no abdominal  tenderness.  Musculoskeletal:     Cervical back: Normal range of motion and neck supple.     Comments: R AKA noted with WV in place on stump  Skin:    General: Skin is warm and dry.  Neurological:     General: No focal deficit present.     Mental Status: She is alert.  Psychiatric:        Mood and Affect: Mood normal.      Consultants:  Vascular surgery  Procedures:  02/05/2023: Right AKA  Data Reviewed: Results for orders placed or performed during the hospital encounter of 02/05/23 (from the past 24 hours)  Glucose, capillary     Status: Abnormal   Collection Time: 02/09/23  9:49 PM  Result Value Ref Range   Glucose-Capillary 180 (H) 70 - 99 mg/dL  Basic metabolic panel     Status: Abnormal   Collection Time: 02/10/23  6:08 AM  Result  Value Ref Range   Sodium 135 135 - 145 mmol/L   Potassium 4.8 3.5 - 5.1 mmol/L   Chloride 98 98 - 111 mmol/L   CO2 23 22 - 32 mmol/L   Glucose, Bld 149 (H) 70 - 99 mg/dL   BUN 20 6 - 20 mg/dL   Creatinine, Ser 8.51 (H) 0.44 - 1.00 mg/dL   Calcium  9.7 8.9 - 10.3 mg/dL   GFR, Estimated 45 (L) >60 mL/min   Anion gap 14 5 - 15  CBC with Differential/Platelet     Status: Abnormal   Collection Time: 02/10/23  6:08 AM  Result Value Ref Range   WBC 10.1 4.0 - 10.5 K/uL   RBC 3.66 (L) 3.87 - 5.11 MIL/uL   Hemoglobin 8.2 (L) 12.0 - 15.0 g/dL   HCT 72.1 (L) 63.9 - 53.9 %   MCV 76.0 (L) 80.0 - 100.0 fL   MCH 22.4 (L) 26.0 - 34.0 pg   MCHC 29.5 (L) 30.0 - 36.0 g/dL   RDW 78.1 (H) 88.4 - 84.4 %   Platelets 533 (H) 150 - 400 K/uL   nRBC 0.0 0.0 - 0.2 %   Neutrophils Relative % 63 %   Neutro Abs 6.4 1.7 - 7.7 K/uL   Lymphocytes Relative 25 %   Lymphs Abs 2.5 0.7 - 4.0 K/uL   Monocytes Relative 8 %   Monocytes Absolute 0.8 0.1 - 1.0 K/uL   Eosinophils Relative 4 %   Eosinophils Absolute 0.4 0.0 - 0.5 K/uL   Basophils Relative 0 %   Basophils Absolute 0.0 0.0 - 0.1 K/uL   WBC Morphology See Note    Smear Review See Note    nRBC 1 (H) 0 /100  WBC   Abs Immature Granulocytes 0.00 0.00 - 0.07 K/uL   Polychromasia PRESENT   Magnesium      Status: None   Collection Time: 02/10/23  6:08 AM  Result Value Ref Range   Magnesium  2.1 1.7 - 2.4 mg/dL  Glucose, capillary     Status: Abnormal   Collection Time: 02/10/23 10:32 AM  Result Value Ref Range   Glucose-Capillary 190 (H) 70 - 99 mg/dL    I have reviewed pertinent nursing notes, vitals, labs, and images as necessary. I have ordered labwork to follow up on as indicated.  I have reviewed the last notes from staff over past 24 hours. I have discussed patient's care plan and test results with nursing staff, CM/SW, and other staff as appropriate.    LOS: 5 days   Alm Apo, MD [facility] 02/10/2023, 10:39 AM

## 2023-02-10 NOTE — Progress Notes (Signed)
 Inpatient Rehabilitation Admissions Coordinator   I await updated OT assessment today to begin Auth with [employer] to readmit to CIR. I met with patient at bedside and she is aware.  Heron Leavell, RN, MSN Rehab Admissions Coordinator [phone] 02/10/2023 12:51 PM

## 2023-02-11 LAB — CBC WITH DIFFERENTIAL/PLATELET
Abs Immature Granulocytes: 0.03 10*3/uL (ref 0.00–0.07)
Basophils Absolute: 0 10*3/uL (ref 0.0–0.1)
Basophils Relative: 0 %
Eosinophils Absolute: 0.4 10*3/uL (ref 0.0–0.5)
Eosinophils Relative: 4 %
HCT: 24.7 % — ABNORMAL LOW (ref 36.0–46.0)
Hemoglobin: 7.2 g/dL — ABNORMAL LOW (ref 12.0–15.0)
Immature Granulocytes: 0 %
Lymphocytes Relative: 23 %
Lymphs Abs: 2.1 10*3/uL (ref 0.7–4.0)
MCH: 21.9 pg — ABNORMAL LOW (ref 26.0–34.0)
MCHC: 29.1 g/dL — ABNORMAL LOW (ref 30.0–36.0)
MCV: 75.1 fL — ABNORMAL LOW (ref 80.0–100.0)
Monocytes Absolute: 0.9 10*3/uL (ref 0.1–1.0)
Monocytes Relative: 10 %
Neutro Abs: 5.6 10*3/uL (ref 1.7–7.7)
Neutrophils Relative %: 63 %
Platelets: 451 10*3/uL — ABNORMAL HIGH (ref 150–400)
RBC: 3.29 MIL/uL — ABNORMAL LOW (ref 3.87–5.11)
RDW: 21.2 % — ABNORMAL HIGH (ref 11.5–15.5)
Smear Review: NORMAL
WBC: 9 10*3/uL (ref 4.0–10.5)
nRBC: 0 % (ref 0.0–0.2)

## 2023-02-11 LAB — BASIC METABOLIC PANEL
Anion gap: 10 (ref 5–15)
BUN: 24 mg/dL — ABNORMAL HIGH (ref 6–20)
CO2: 25 mmol/L (ref 22–32)
Calcium: 9.3 mg/dL (ref 8.9–10.3)
Chloride: 99 mmol/L (ref 98–111)
Creatinine, Ser: 1.41 mg/dL — ABNORMAL HIGH (ref 0.44–1.00)
GFR, Estimated: 47 mL/min — ABNORMAL LOW (ref >60)
Glucose, Bld: 154 mg/dL — ABNORMAL HIGH (ref 70–99)
Potassium: 4.3 mmol/L (ref 3.5–5.1)
Sodium: 134 mmol/L — ABNORMAL LOW (ref 135–145)

## 2023-02-11 LAB — MAGNESIUM: Magnesium: 1.9 mg/dL (ref 1.7–2.4)

## 2023-02-11 LAB — GLUCOSE, CAPILLARY
Glucose-Capillary: 141 mg/dL — ABNORMAL HIGH (ref 70–99)
Glucose-Capillary: 111 mg/dL — ABNORMAL HIGH (ref 70–99)

## 2023-02-11 MED ORDER — HYDROMORPHONE HCL 1 MG/ML IJ SOLN
2.0000 mg | INTRAMUSCULAR | Status: DC | PRN
  Administered 2023-02-11 – 2023-02-12 (×3): 2 mg via INTRAVENOUS
  Filled 2023-02-11 (×3): qty 2

## 2023-02-11 MED ORDER — TRAMADOL HCL 50 MG PO TABS
50.0000 mg | ORAL_TABLET | Freq: Four times a day (QID) | ORAL | Status: DC | PRN
  Administered 2023-02-11 – 2023-02-12 (×2): 50 mg via ORAL
  Filled 2023-02-11 (×2): qty 1

## 2023-02-11 NOTE — Progress Notes (Signed)
 Inpatient Rehabilitation Admissions Coordinator   I have insurance approval and hopeful for a CIR bed Thursday to readmit. I discussed with patient the need to decrease use of IV Dilaudid  usage as able.  Heron Leavell, RN, MSN Rehab Admissions Coordinator [phone] 02/11/2023 11:53 AM

## 2023-02-11 NOTE — PMR Pre-admission (Signed)
 PMR Admission Coordinator Pre-Admission Assessment  Patient: Jennifer [name] is an 43 y.o., female MRN: [id number] DOB: [birth year] Height:   Weight: 80.4 kg  Insurance Information HMO:     PPO:      PCP:      IPA:      80/20:      OTHER:  PRIMARY: [place] Medicaid BCBS Healthy [employer]      Policy#: [id number]      Subscriber: pt CM Name: Andres      Phone#: [phone]     Fax#: [phone] Pre-Cert#: [id number] approved 1/9 until 1/15      Employer:  Benefits:  Phone #: [phone]     Name: 1/7 Eff. Date: 05/05/22 until 04/02/24     Deduct: none      Out of Pocket Max: none      Life Max: none CIR: per medicaid      SNF: per medicaid Outpatient: $40 co pay per visit     Co-Pay:  Home Health: per medicaid      Co-Pay:  DME: per medicaid     Co-Pay:  Providers: in network  SECONDARY: none  Financial Counselor:       Phone#:   The Data Processing Manager" for patients in Inpatient Rehabilitation Facilities with attached "Privacy Act Statement-Health Care Records" was provided and verbally reviewed with: Patient  Emergency Contact Information Contact Information     Name Relation Home Work Mobile   King,Jamie Spouse   [phone]      Other Contacts     Name Relation Home Work Mobile   Allison,Wanda Relative   [phone]   King,Chiffon S Relative [phone]         Current Medical History  Patient Admitting Diagnosis: AKA  History of Present Illness: Jennifer [name] is a 42 year old female with history of T1 DM, HTN, A flutter, ongoing tobacco use who was admitted on [facility] on 01/15/23 with 2 day history of RLE pain with coldness, numbness and difficulty walking. She was found to have ischemic RLE, lack of DP/PT on dopplers, was anemic with hemoglobin 7.5 and BS 602 at admission. She was started on IV heparin  and  taken to OR for aortogram with mechanical thrombectomy to right anterior tibial artery and stent placement and right popliteal artery on the same day by  Dr. Marea.  She  had drop in hemoglobin to 5.9 and was transfused with 2 units PRBC.  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 Elnor.  Postop pain continue to be an issue.  She was tried on Lyrica  but then this was changed back to gabapentin .  She required additional units PRBC on 12/14 with most recent CBC revealing H&H at 8.0 and 27.6. Admitted to CIR on 01/23/23.   Readmitted to acute at [facility] hospital on 02/05/23 with continued leg pain and issues with the operative site including worsening ischemic changes. Vascular surgery followed and AKA performed on 1/2. Postoperative pain management with MS contin , tramadol  and Dilaudid . Continue with home diltiazem  for HTN. Resumed ELiquis  on 1/7 and will monitor Hgb . Continue with iron  supplementation. History of vaginal bleeding in the setting of fibroids.  Patient's medical record from [facility] has been reviewed by the rehabilitation admission coordinator and physician.  Past Medical History  Past Medical History:  Diagnosis Date   Abnormal uterine bleeding    Due to fibriods/required transfusions   Acute blood loss  anemia 10/13/2021   Acute kidney injury superimposed on chronic kidney disease (HCC) 10/13/2021   Acute lower limb ischemia 01/15/2023   Atrial fibrillation with RVR (HCC) 05/2022   underwent cardioversion by Dr. Perla   Closed fracture of coronoid process of right ulna 2011   Diabetes mellitus without complication (HCC)    Diabetic ketoacidosis associated with type 1 diabetes mellitus (HCC) 10/06/2015   Headache    Hyperglycemia without ketosis 10/21/2015   Hypertension    Hypertensive urgency 10/13/2021   Menorrhagia    OSA (obstructive sleep apnea)    Serum potassium elevated 02/05/2023   Has the patient had major surgery during 100 days prior to admission? Yes  Family History   family history includes Diabetes in an other family member; Hypertension in an other  family member. She was adopted.  Current Medications  Current Facility-Administered Medications:    acetaminophen  (TYLENOL ) tablet 650 mg, 650 mg, Oral, Q6H PRN **OR** acetaminophen  (TYLENOL ) suppository 650 mg, 650 mg, Rectal, Q6H PRN, Melvin, Alexander B, MD   apixaban  (ELIQUIS ) tablet 5 mg, 5 mg, Oral, BID, Girguis, David, MD, 5 mg at 02/12/23 9166   cyclobenzaprine  (FLEXERIL ) tablet 10 mg, 10 mg, Oral, TID PRN, Melvin, Alexander B, MD, 10 mg at 02/11/23 1306   diltiazem  (CARDIZEM  CD) 24 hr capsule 180 mg, 180 mg, Oral, Daily, Melvin, Alexander B, MD, 180 mg at 02/12/23 9166   gabapentin  (NEURONTIN ) capsule 400 mg, 400 mg, Oral, QID, Melvin, Alexander B, MD, 400 mg at 02/12/23 9166   HYDROmorphone  (DILAUDID ) injection 2 mg, 2 mg, Intravenous, Q4H PRN, Girguis, David, MD, 2 mg at 02/12/23 0556   insulin  glargine-yfgn (SEMGLEE ) injection 20 Units, 20 Units, Subcutaneous, BID, Melvin, Alexander B, MD, 20 Units at 02/12/23 9165   iron  polysaccharides (NIFEREX) capsule 150 mg, 150 mg, Oral, BID AC, Melvin, Alexander B, MD, 150 mg at 02/11/23 1757   magnesium  gluconate (MAGONATE) tablet 250 mg, 250 mg, Oral, QHS, Melvin, Alexander B, MD, 250 mg at 02/11/23 2213   morphine  (MS CONTIN ) 12 hr tablet 30 mg, 30 mg, Oral, Q12H, Melvin, Alexander B, MD, 30 mg at 02/12/23 9166   pantoprazole  (PROTONIX ) EC tablet 40 mg, 40 mg, Oral, Daily, Melvin, Alexander B, MD, 40 mg at 02/12/23 9166   polyethylene glycol (MIRALAX  / GLYCOLAX ) packet 17 g, 17 g, Oral, Daily, Melvin, Alexander B, MD, 17 g at 02/10/23 1025   senna (SENOKOT) tablet 17.2 mg, 2 tablet, Oral, QHS, Melvin, Alexander B, MD, 17.2 mg at 02/10/23 2129   sodium chloride  flush (NS) 0.9 % injection 3 mL, 3 mL, Intravenous, Q12H, Melvin, Alexander B, MD, 3 mL at 02/12/23 9165   traMADol  (ULTRAM ) tablet 50 mg, 50 mg, Oral, Q6H PRN, Girguis, David, MD, 50 mg at 02/11/23 1605   traZODone  (DESYREL ) tablet 100 mg, 100 mg, Oral, QHS, Melvin, Alexander B, MD,  100 mg at 02/11/23 2215  Patients Current Diet:  Diet Order             Diet regular Fluid consistency: Thin  Diet effective now                   Precautions / Restrictions Precautions Precautions: Fall Restrictions Weight Bearing Restrictions Per Provider Order: Yes RLE Weight Bearing Per Provider Order: Non weight bearing Other Position/Activity Restrictions: s/p R AKA   Has the patient had 2 or more falls or a fall with injury in the past year? No  Prior Activity Level Limited Community (1-2x/wk): Independent with  no DME at baseline  Prior Functional Level Self Care: Did the patient need help bathing, dressing, using the toilet or eating? Independent  Indoor Mobility: Did the patient need assistance with walking from room to room (with or without device)? Independent  Stairs: Did the patient need assistance with internal or external stairs (with or without device)? Independent  Functional Cognition: Did the patient need help planning regular tasks such as shopping or remembering to take medications? Independent  Patient Information Are you of Hispanic, Latino/a,or Spanish origin?: A. No, not of Hispanic, Latino/a, or Spanish origin What is your race?: B. Black or African American Do you need or want an interpreter to communicate with a doctor or health care staff?: 0. No  Patient's Response To:  Health Literacy and Transportation Is the patient able to respond to health literacy and transportation needs?: Yes Health Literacy - How often do you need to have someone help you when you read instructions, pamphlets, or other written material from your doctor or pharmacy?: Never In the past 12 months, has lack of transportation kept you from medical appointments or from getting medications?: No In the past 12 months, has lack of transportation kept you from meetings, work, or from getting things needed for daily living?: No  Home Assistive Devices / Equipment Home  Equipment: Crutches  Prior Device Use: Indicate devices/aids used by the patient prior to current illness, exacerbation or injury? None of the above  Current Functional Level Cognition  Overall Cognitive Status: Within Functional Limits for tasks assessed Orientation Level: Oriented X4 General Comments: motivated to participate, asking about going back to CIR    Extremity Assessment (includes Sensation/Coordination)  Upper Extremity Assessment: Overall WFL for tasks assessed  Lower Extremity Assessment: RLE deficits/detail RLE Deficits / Details: s/p R AKA RLE: Unable to fully assess due to pain    ADLs  Overall ADL's : Needs assistance/impaired Eating/Feeding: Independent, Sitting Grooming: Sitting, Set up Upper Body Bathing: Sitting, Set up Lower Body Bathing: Contact guard assist, Sit to/from stand Lower Body Bathing Details (indicate cue type and reason): simulated via LB pericare in standing Lower Body Dressing: Bed level, Moderate assistance Toilet Transfer: Minimal assistance, BSC/3in1, Stand-pivot, Rolling walker (2 wheels), Cueing for safety, Cueing for sequencing Toilet Transfer Details (indicate cue type and reason): stand pivot from EOB<>BSC wtih rw and MIN A d/t increased pain Toileting- Clothing Manipulation and Hygiene: Contact guard assist, Sit to/from stand Toileting - Clothing Manipulation Details (indicate cue type and reason): able to complete anterior pericare after continent urine void in standing with CGA for balance and unilateral support from RW Functional mobility during ADLs: Minimal assistance, Rolling walker (2 wheels), Cueing for safety, Cueing for sequencing (stand pivot only) General ADL Comments: ADL participation greatly limited by pain this session    Mobility  Overal bed mobility: Needs Assistance Bed Mobility: Supine to Sit Supine to sit: Supervision Sit to supine: Min assist General bed mobility comments: Pt received sitting on EOB, uses BUE  to assist RLE to square up to sitting on EOB.    Transfers  Overall transfer level: Needs assistance Equipment used: Rolling walker (2 wheels) Transfers: Sit to/from Stand Sit to Stand: Contact guard assist, Supervision Bed to/from chair/wheelchair/BSC transfer type:: Stand pivot Stand pivot transfers: Contact guard assist Squat pivot transfers: Contact guard assist Step pivot transfers: Min assist General transfer comment: STS from EOB, recliner with CGA fade to supervision with good ability to push to standing with 1-2 UE. Stand pivot bed>recliner with RW &  CGA with pt scooting L foot along floor vs picking it up/hopping to complete transfer.    Ambulation / Gait / Stairs / Wheelchair Mobility  Ambulation/Gait Ambulation/Gait assistance: Contact guard assist Gait Distance (Feet): 5 Feet (~2.5 ft forwards & backwards with RW) Assistive device: Rolling walker (2 wheels) Gait Pattern/deviations: Step-to pattern General Gait Details: Pt does attempt hopping forward on L foot x 1 time but notes increased R AKA pain with jarring movement. Pt instead scoots L foot along floor. Pt self limits gait distance 2/2 hoping to not become lightheaded with mobility noting this happened yesterday. Gait velocity: decreased Gait velocity interpretation: <1.31 ft/sec, indicative of household Actuary propulsion: Both upper extremities Wheelchair Assistance Details (indicate cue type and reason): max verbal cues for technique (pivots, propulsion, retro etc). max assist needed to manage WC parts (brakes, arm rest)    Posture / Balance Dynamic Sitting Balance Sitting balance - Comments: sitting EOB Balance Overall balance assessment: Needs assistance Sitting-balance support: Feet supported Sitting balance-Leahy Scale: Good Sitting balance - Comments: sitting EOB Standing balance support: Single extremity supported, During functional activity, Reliant on assistive device  for balance Standing balance-Leahy Scale: Poor Standing balance comment: Pt able to pull underwear over hips in standing with 1 UE support & CGA<>min assist from PT.    Special needs/care consideration CIR 12/20 until 1/2 and readmitted to acute       CPAP at [facility]  Previous Home Environment  Living Arrangements: Children, Spouse/significant other  Lives With: Spouse, Other (Comment) (37 y/o and 38 y/o) Available Help at Discharge: Family, Available PRN/intermittently Type of Home: House Home Layout: One level Home Access: Stairs to enter Entrance Stairs-Rails: Right Entrance Stairs-Number of Steps: 4 Bathroom Shower/Tub: Tub/shower unit, Buyer, Retail: Yes  Discharge Living Setting Plans for Discharge Living Setting: Patient's home, House Type of Home at Discharge: House Discharge Home Layout: One level Discharge Home Access: Stairs to enter Entrance Stairs-Rails: Left Entrance Stairs-Number of Steps: 4-5 Discharge Bathroom Shower/Tub: Tub/shower unit Discharge Bathroom Toilet: Standard Discharge Bathroom Accessibility: No Does the patient have any problems obtaining your medications?: No  Social/Family/Support Systems Patient Roles: Spouse, Parent Anticipated Industrial/product Designer Information: see contacts Ability/Limitations of Caregiver: spouse works Medical Laboratory Scientific Officer: Evenings only Discharge Plan Discussed with Primary Caregiver: Yes Is Caregiver In Agreement with Plan?: Yes Does Caregiver/Family have Issues with Lodging/Transportation while Pt is in Rehab?: No  Goals Patient/Family Goal for Rehab: Mod I with PT and OT Expected length of stay: ELOS 5 to 7 days Pt/Family Agrees to Admission and willing to participate: Yes Program Orientation Provided & Reviewed with Pt/Caregiver Including Roles  & Responsibilities: Yes  Decrease burden of Care through IP rehab admission: n/a  Possible need for SNF placement upon  discharge: not anticipated  Patient Condition: I have reviewed medical records from [facility], spoken with  patient. I met with patient at the bedside for inpatient rehabilitation assessment.  Patient will benefit from ongoing PT and OT, can actively participate in 3 hours of therapy a day 5 days of the week, and can make measurable gains during the admission.  Patient will also benefit from the coordinated team approach during an Inpatient Acute Rehabilitation admission.  The patient will receive intensive therapy as well as Rehabilitation physician, nursing, social worker, and care management interventions.  Due to bladder management, bowel management, safety, skin/wound care, disease management, medication administration, pain management, and patient education the patient requires 24 hour a day rehabilitation  nursing.  The patient is currently min assist overall with mobility and basic ADLs.  Discharge setting and therapy post discharge at home with home health is anticipated.  Patient has agreed to participate in the Acute Inpatient Rehabilitation Program and will admit today.  Preadmission Screen Completed By:  Alison Heron Lot, RN MSN 02/12/2023 10:30 AM ______________________________________________________________________   Discussed status with Dr. Emeline on 02/12/23 at 1031 and received approval for admission today.  Admission Coordinator:  Alison Heron Lot, RN MSN time [id number] Date 02/12/23   Assessment/Plan: Diagnosis: Right AKA Does the need for close, 24 hr/day Medical supervision in concert with the patient's rehab needs make it unreasonable for this patient to be served in a less intensive setting? Yes Co-Morbidities requiring supervision/potential complications: Phantom and post-surgical limb pain, hypertension, OSA, anemia/menorrhagia, diabetes, and atrial fibrillation Due to safety, skin/wound care, disease management, medication administration, pain management, and  patient education, does the patient require 24 hr/day rehab nursing? Yes Does the patient require coordinated care of a physician, rehab nurse, PT, OT to address physical and functional deficits in the context of the above medical diagnosis(es)? Yes Addressing deficits in the following areas: balance, endurance, locomotion, strength, transferring, bathing, dressing, grooming, and toileting Can the patient actively participate in an intensive therapy program of at least 3 hrs of therapy 5 days a week? Yes The potential for patient to make measurable gains while on inpatient rehab is good Anticipated functional outcomes upon discharge from inpatient rehab: modified independent PT, modified independent OT Estimated rehab length of stay to reach the above functional goals is: 5-7 days Anticipated discharge destination: Home 10. Overall Rehab/Functional Prognosis: good   MD Signature:  Joesph JAYSON Emeline, DO 02/12/2023

## 2023-02-11 NOTE — Progress Notes (Signed)
 Progress Note    Jennifer [name]   FMW:[id number]  DOB: 1980/02/18  DOA: 02/05/2023     6 PCP: [facility]  Initial CC: R stump wound  Hospital Course: Jennifer [name] is a 43 y.o. 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   Patient initially seen at [facility] from 12/12 until 12/20 with right lower extremity ischemia underwent right lower extremity popliteal stenting and right BKA.  While there was noted to have A-fib and was started on anticoagulation with close monitoring in the setting of history of menorrhagia.  Also noted to have labile diabetes.   Ultimately discharged to CIR on 12/20 where she remained until 1/2.  While there she had continued leg pain and had issues with the operative site including worsening ischemic changes.  Vascular surgery followed and AKA was ultimately performed 02/05/23.  Interval History:  Clemens onto the bed yesterday working with therapy.  Stump sore but otherwise no obvious injury.  Having some vaginal spotting since resuming Eliquis  yesterday which is typical for her; some drop in hemoglobin noted as well. Also discussed her Dilaudid  use and she plans to cut back some today to facilitate getting back to CIR.   Assessment and Plan:  RLE limb ischemia - As per HPI patient initially seen at [facility] in December and underwent BKA and popliteal stenting of the right lower extremity.  Had been at inpatient rehab but had continued blistering and ischemic changes at her BKA site and ultimately had AKA performed, 02/05/23.  No operative complications reported - WV and stump sock in place; planning to be removed per vascular on 02/12/23 - follow up in 4-5 weeks for staple removal; office to arrange follow up  - Patient does have some history with perioperative bleeding and  menorrhagia so we will monitor closely -Anticipating return to CIR; insurance approved, awaiting bed - Appreciate vascular surgery recommendations and assistance - Continue with scheduled MS Contin  - Continue with as needed tramadol  for moderate pain and as needed p.o. Dilaudid  for severe pain - PRN dilaudid   - Supportive care - PT/OT eval and treat   Hypertension - Continue home diltiazem    Atrial fibrillation - Continue home diltiazem  -Was on Eliquis  from 01/18/2023 until 01/29/2023 then started on heparin  until surgery -Resuming Eliquis  on 02/10/2023.  Monitor hemoglobin for stability after resuming and any signs of bleeding (some vaginal spotting as expected, 02/11/2023 with mild hemoglobin drop).  Continue trending hemoglobin   Anemia Menorrhagia > Known history of vaginal bleeding in the setting fibroids and chronic anemia related to this > Hemoglobin has recently been around 8 - Trend CBC - Transfuse hemoglobin less than 7 - Continue with iron  supplementation   Obesity - Noted   OSA - Continue CPAP   Old records reviewed in assessment of this patient  Antimicrobials:   DVT prophylaxis:  SCDs Start: 02/05/23 1435 apixaban  (ELIQUIS ) tablet 5 mg   Code Status:   Code Status: Full Code  Mobility Assessment (Last 72 Hours)     Mobility Assessment     Row Name 02/11/23 0934 02/11/23 0834 02/10/23 2122 02/10/23 1357 02/10/23 0803   Does patient have an order for bedrest or is patient medically unstable No - Continue assessment -- No - Continue assessment -- No - Continue assessment   What is the highest level of mobility based on the progressive mobility  assessment? Level 5 (Walks with assist in room/hall) - Balance while stepping forward/back and can walk in room with assist - Complete Level 3 (Stands with assist) - Balance while standing  and cannot march in place Level 3 (Stands with assist) - Balance while standing  and cannot march in place Level 3 (Stands with  assist) - Balance while standing  and cannot march in place Level 3 (Stands with assist) - Balance while standing  and cannot march in place   Is the above level different from baseline mobility prior to current illness? Yes - Recommend PT order -- Yes - Recommend PT order -- Yes - Recommend PT order    Row Name 02/09/23 1955 02/09/23 1300 02/09/23 1200 02/09/23 0800 02/08/23 2050   Does patient have an order for bedrest or is patient medically unstable No - Continue assessment -- -- No - Continue assessment No - Continue assessment   What is the highest level of mobility based on the progressive mobility assessment? Level 3 (Stands with assist) - Balance while standing  and cannot march in place Level 3 (Stands with assist) - Balance while standing  and cannot march in place Level 3 (Stands with assist) - Balance while standing  and cannot march in place Level 3 (Stands with assist) - Balance while standing  and cannot march in place Level 3 (Stands with assist) - Balance while standing  and cannot march in place   Is the above level different from baseline mobility prior to current illness? Yes - Recommend PT order -- -- Yes - Recommend PT order Yes - Recommend PT order            Barriers to discharge: none Disposition Plan:  ?CIR HH orders placed: n/a Status is: Inpt  Objective: Blood pressure 130/73, pulse 100, temperature 98.9 F (37.2 C), temperature source Oral, resp. rate 18, weight 80.4 kg, SpO2 98%.  Examination:  Physical Exam Constitutional:      General: She is not in acute distress.    Appearance: Normal appearance.  HENT:     Head: Normocephalic and atraumatic.     Mouth/Throat:     Mouth: Mucous membranes are moist.  Eyes:     Extraocular Movements: Extraocular movements intact.  Cardiovascular:     Rate and Rhythm: Normal rate and regular rhythm.  Pulmonary:     Effort: Pulmonary effort is normal. No respiratory distress.     Breath sounds: Normal breath sounds.  No wheezing.  Abdominal:     General: Bowel sounds are normal. There is no distension.     Palpations: Abdomen is soft.     Tenderness: There is no abdominal tenderness.  Musculoskeletal:     Cervical back: Normal range of motion and neck supple.     Comments: R AKA noted with WV in place on stump  Skin:    General: Skin is warm and dry.  Neurological:     General: No focal deficit present.     Mental Status: She is alert.  Psychiatric:        Mood and Affect: Mood normal.      Consultants:  Vascular surgery  Procedures:  02/05/2023: Right AKA  Data Reviewed: Results for orders placed or performed during the hospital encounter of 02/05/23 (from the past 24 hours)  Glucose, capillary     Status: Abnormal   Collection Time: 02/10/23  9:28 PM  Result Value Ref Range   Glucose-Capillary 191 (H) 70 - 99 mg/dL  Comment 1 Document in Chart   Basic metabolic panel     Status: Abnormal   Collection Time: 02/11/23  6:28 AM  Result Value Ref Range   Sodium 134 (L) 135 - 145 mmol/L   Potassium 4.3 3.5 - 5.1 mmol/L   Chloride 99 98 - 111 mmol/L   CO2 25 22 - 32 mmol/L   Glucose, Bld 154 (H) 70 - 99 mg/dL   BUN 24 (H) 6 - 20 mg/dL   Creatinine, Ser 8.58 (H) 0.44 - 1.00 mg/dL   Calcium  9.3 8.9 - 10.3 mg/dL   GFR, Estimated 47 (L) >60 mL/min   Anion gap 10 5 - 15  CBC with Differential/Platelet     Status: Abnormal   Collection Time: 02/11/23  6:28 AM  Result Value Ref Range   WBC 9.0 4.0 - 10.5 K/uL   RBC 3.29 (L) 3.87 - 5.11 MIL/uL   Hemoglobin 7.2 (L) 12.0 - 15.0 g/dL   HCT 75.2 (L) 63.9 - 53.9 %   MCV 75.1 (L) 80.0 - 100.0 fL   MCH 21.9 (L) 26.0 - 34.0 pg   MCHC 29.1 (L) 30.0 - 36.0 g/dL   RDW 78.7 (H) 88.4 - 84.4 %   Platelets 451 (H) 150 - 400 K/uL   nRBC 0.0 0.0 - 0.2 %   Neutrophils Relative % 63 %   Neutro Abs 5.6 1.7 - 7.7 K/uL   Lymphocytes Relative 23 %   Lymphs Abs 2.1 0.7 - 4.0 K/uL   Monocytes Relative 10 %   Monocytes Absolute 0.9 0.1 - 1.0 K/uL    Eosinophils Relative 4 %   Eosinophils Absolute 0.4 0.0 - 0.5 K/uL   Basophils Relative 0 %   Basophils Absolute 0.0 0.0 - 0.1 K/uL   WBC Morphology MORPHOLOGY UNREMARKABLE    RBC Morphology MORPHOLOGY UNREMARKABLE    Smear Review Normal platelet morphology    Immature Granulocytes 0 %   Abs Immature Granulocytes 0.03 0.00 - 0.07 K/uL  Magnesium      Status: None   Collection Time: 02/11/23  6:28 AM  Result Value Ref Range   Magnesium  1.9 1.7 - 2.4 mg/dL  Glucose, capillary     Status: Abnormal   Collection Time: 02/11/23  9:24 AM  Result Value Ref Range   Glucose-Capillary 141 (H) 70 - 99 mg/dL    I have reviewed pertinent nursing notes, vitals, labs, and images as necessary. I have ordered labwork to follow up on as indicated.  I have reviewed the last notes from staff over past 24 hours. I have discussed patient's care plan and test results with nursing staff, CM/SW, and other staff as appropriate.    LOS: 6 days   Alm Apo, MD [facility] 02/11/2023, 10:58 AM

## 2023-02-11 NOTE — Plan of Care (Signed)

## 2023-02-11 NOTE — Progress Notes (Signed)
 Physical Therapy Treatment Patient Details Name: Jennifer [name] MRN: [id number] DOB: 1980/11/06 Today's Date: 02/11/2023   History of Present Illness Pt is a 43 y.o. 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    PT Comments  Pt seen for PT tx with pt agreeable. Pt motivated to participate, asking about going back to CIR to finish program. Pt is able to complete transfers with CGA<>supervision & does attempt ambulation with RW but pt unable to hop on L foot 2/2 jarring movement causing increased pain in R AKA. Pt would benefit from ongoing PT services to address strengthening, balance, activity tolerance, to increase independence with transfers & gait.    If plan is discharge home, recommend the following: A little help with walking and/or transfers;Assistance with cooking/housework;A little help with bathing/dressing/bathroom;Assist for transportation;Help with stairs or ramp for entrance   Can travel by private vehicle        Equipment Recommendations  Other (comment) (defer to next venue)    Recommendations for Other Services Rehab consult     Precautions / Restrictions Precautions Precautions: Fall Restrictions Weight Bearing Restrictions Per Provider Order: Yes RLE Weight Bearing Per Provider Order: Non weight bearing Other Position/Activity Restrictions: s/p R AKA     Mobility  Bed Mobility               General bed mobility comments: Pt received sitting on EOB, uses BUE to assist RLE to square up to sitting on EOB.    Transfers Overall transfer level: Needs assistance Equipment used: Rolling walker (2 wheels) Transfers: Sit to/from Stand Sit to Stand: Contact guard assist, Supervision Stand pivot transfers: Contact guard assist         General transfer comment: STS from EOB, recliner  with CGA fade to supervision with good ability to push to standing with 1-2 UE. Stand pivot bed>recliner with RW & CGA with pt scooting L foot along floor vs picking it up/hopping to complete transfer.    Ambulation/Gait Ambulation/Gait assistance: Contact guard assist Gait Distance (Feet): 5 Feet (~2.5 ft forwards & backwards with RW) Assistive device: Rolling walker (2 wheels)   Gait velocity: decreased     General Gait Details: Pt does attempt hopping forward on L foot x 1 time but notes increased R AKA pain with jarring movement. Pt instead scoots L foot along floor. Pt self limits gait distance 2/2 hoping to not become lightheaded with mobility noting this happened yesterday.   Stairs             Wheelchair Mobility     Tilt Bed    Modified Rankin (Stroke Patients Only)       Balance Overall balance assessment: Needs assistance Sitting-balance support: Feet supported Sitting balance-Leahy Scale: Good     Standing balance support: Single extremity supported, During functional activity, Reliant on assistive device for balance Standing balance-Leahy Scale: Poor Standing balance comment: Pt able to pull underwear over hips in standing with 1 UE support & CGA<>min assist from PT.                            Cognition Arousal: Alert Behavior During Therapy: WFL for tasks assessed/performed Overall Cognitive Status: Within Functional Limits for tasks assessed  General Comments: motivated to participate, asking about going back to CIR        Exercises      General Comments General comments (skin integrity, edema, etc.): Pt reported to have her menstral cycle, provided pt with mesh underwear & pad & nurse made aware.      Pertinent Vitals/Pain Pain Assessment Pain Assessment: Faces Faces Pain Scale: Hurts even more Pain Location: RLE with some movement Pain Descriptors / Indicators: Grimacing,  Guarding, Discomfort, Tightness (pt reports she experienced LOB landing on R AKA on EOB yesterday & noting increased pain since then) Pain Intervention(s): Monitored during session, Limited activity within patient's tolerance    Home Living                          Prior Function            PT Goals (current goals can now be found in the care plan section) Acute Rehab PT Goals Patient Stated Goal: Finish CIR PT Goal Formulation: With patient Time For Goal Achievement: 02/20/23 Potential to Achieve Goals: Good Progress towards PT goals: Progressing toward goals    Frequency    Min 1X/week      PT Plan      Co-evaluation              AM-PAC PT 6 Clicks Mobility   Outcome Measure  Help needed turning from your back to your side while in a flat bed without using bedrails?: A Little Help needed moving from lying on your back to sitting on the side of a flat bed without using bedrails?: A Little Help needed moving to and from a bed to a chair (including a wheelchair)?: A Little Help needed standing up from a chair using your arms (e.g., wheelchair or bedside chair)?: A Little Help needed to walk in hospital room?: A Little Help needed climbing 3-5 steps with a railing? : Total 6 Click Score: 16    End of Session   Activity Tolerance: Patient tolerated treatment well Patient left: in chair;with chair alarm set;with call bell/phone within reach (wound vac intact, plugged in) Nurse Communication: Mobility status PT Visit Diagnosis: Difficulty in walking, not elsewhere classified (R26.2);Pain;Other abnormalities of gait and mobility (R26.89);Unsteadiness on feet (R26.81);Muscle weakness (generalized) (M62.81) Pain - Right/Left: Right Pain - part of body: Leg     Time: 9195-9172 PT Time Calculation (min) (ACUTE ONLY): 23 min  Charges:    $Therapeutic Activity: 23-37 mins PT General Charges $$ ACUTE PT VISIT: 1 Visit                     [name], PT, DPT 02/11/23, 8:36 AM  [name] 02/11/2023, 8:35 AM

## 2023-02-12 ENCOUNTER — Encounter (HOSPITAL_COMMUNITY): Payer: Self-pay | Admitting: Physical Medicine and Rehabilitation

## 2023-02-12 ENCOUNTER — Other Ambulatory Visit: Payer: Self-pay

## 2023-02-12 ENCOUNTER — Inpatient Hospital Stay (HOSPITAL_COMMUNITY)
Admission: AD | Admit: 2023-02-12 | Discharge: 2023-02-19 | DRG: 560 | Disposition: A | Discharge status: Home / Self Care | Payer: Medicaid Other | Source: Intra-hospital | Attending: Physical Medicine and Rehabilitation | Admitting: Physical Medicine and Rehabilitation

## 2023-02-12 DIAGNOSIS — E663 Overweight: Secondary | ICD-10-CM | POA: Diagnosis present

## 2023-02-12 DIAGNOSIS — I10 Essential (primary) hypertension: Secondary | ICD-10-CM | POA: Diagnosis present

## 2023-02-12 DIAGNOSIS — D509 Iron deficiency anemia, unspecified: Secondary | ICD-10-CM | POA: Diagnosis present

## 2023-02-12 DIAGNOSIS — Z79899 Other long term (current) drug therapy: Secondary | ICD-10-CM

## 2023-02-12 DIAGNOSIS — I4891 Unspecified atrial fibrillation: Secondary | ICD-10-CM | POA: Diagnosis present

## 2023-02-12 DIAGNOSIS — E871 Hypo-osmolality and hyponatremia: Secondary | ICD-10-CM | POA: Diagnosis present

## 2023-02-12 DIAGNOSIS — Z8249 Family history of ischemic heart disease and other diseases of the circulatory system: Secondary | ICD-10-CM | POA: Diagnosis not present

## 2023-02-12 DIAGNOSIS — S88011S Complete traumatic amputation at knee level, right lower leg, sequela: Principal | ICD-10-CM

## 2023-02-12 DIAGNOSIS — S78111A Complete traumatic amputation at level between right hip and knee, initial encounter: Secondary | ICD-10-CM | POA: Diagnosis not present

## 2023-02-12 DIAGNOSIS — M62838 Other muscle spasm: Secondary | ICD-10-CM | POA: Diagnosis present

## 2023-02-12 DIAGNOSIS — I998 Other disorder of circulatory system: Secondary | ICD-10-CM | POA: Diagnosis present

## 2023-02-12 DIAGNOSIS — Z885 Allergy status to narcotic agent status: Secondary | ICD-10-CM | POA: Diagnosis not present

## 2023-02-12 DIAGNOSIS — E119 Type 2 diabetes mellitus without complications: Secondary | ICD-10-CM

## 2023-02-12 DIAGNOSIS — R2689 Other abnormalities of gait and mobility: Principal | ICD-10-CM | POA: Diagnosis present

## 2023-02-12 DIAGNOSIS — R739 Hyperglycemia, unspecified: Secondary | ICD-10-CM | POA: Diagnosis not present

## 2023-02-12 DIAGNOSIS — E1042 Type 1 diabetes mellitus with diabetic polyneuropathy: Secondary | ICD-10-CM | POA: Diagnosis present

## 2023-02-12 DIAGNOSIS — F17210 Nicotine dependence, cigarettes, uncomplicated: Secondary | ICD-10-CM | POA: Diagnosis present

## 2023-02-12 DIAGNOSIS — Z7901 Long term (current) use of anticoagulants: Secondary | ICD-10-CM

## 2023-02-12 DIAGNOSIS — D638 Anemia in other chronic diseases classified elsewhere: Secondary | ICD-10-CM | POA: Diagnosis present

## 2023-02-12 DIAGNOSIS — Z89611 Acquired absence of right leg above knee: Secondary | ICD-10-CM | POA: Diagnosis not present

## 2023-02-12 DIAGNOSIS — G4733 Obstructive sleep apnea (adult) (pediatric): Secondary | ICD-10-CM | POA: Diagnosis present

## 2023-02-12 DIAGNOSIS — Z6827 Body mass index (BMI) 27.0-27.9, adult: Secondary | ICD-10-CM

## 2023-02-12 DIAGNOSIS — Z888 Allergy status to other drugs, medicaments and biological substances status: Secondary | ICD-10-CM | POA: Diagnosis not present

## 2023-02-12 DIAGNOSIS — G546 Phantom limb syndrome with pain: Secondary | ICD-10-CM | POA: Diagnosis present

## 2023-02-12 DIAGNOSIS — N179 Acute kidney failure, unspecified: Secondary | ICD-10-CM | POA: Diagnosis present

## 2023-02-12 DIAGNOSIS — D649 Anemia, unspecified: Secondary | ICD-10-CM | POA: Diagnosis present

## 2023-02-12 DIAGNOSIS — I480 Paroxysmal atrial fibrillation: Secondary | ICD-10-CM | POA: Diagnosis present

## 2023-02-12 DIAGNOSIS — G4700 Insomnia, unspecified: Secondary | ICD-10-CM | POA: Diagnosis present

## 2023-02-12 DIAGNOSIS — F32A Depression, unspecified: Secondary | ICD-10-CM | POA: Diagnosis present

## 2023-02-12 DIAGNOSIS — R609 Edema, unspecified: Secondary | ICD-10-CM | POA: Diagnosis present

## 2023-02-12 DIAGNOSIS — K5901 Slow transit constipation: Secondary | ICD-10-CM | POA: Diagnosis not present

## 2023-02-12 DIAGNOSIS — E1142 Type 2 diabetes mellitus with diabetic polyneuropathy: Secondary | ICD-10-CM | POA: Diagnosis present

## 2023-02-12 DIAGNOSIS — K5900 Constipation, unspecified: Secondary | ICD-10-CM | POA: Diagnosis present

## 2023-02-12 LAB — CBC
HCT: 27.4 % — ABNORMAL LOW (ref 36.0–46.0)
Hemoglobin: 8.2 g/dL — ABNORMAL LOW (ref 12.0–15.0)
MCH: 22.5 pg — ABNORMAL LOW (ref 26.0–34.0)
MCHC: 29.9 g/dL — ABNORMAL LOW (ref 30.0–36.0)
MCV: 75.1 fL — ABNORMAL LOW (ref 80.0–100.0)
Platelets: 454 10*3/uL — ABNORMAL HIGH (ref 150–400)
RBC: 3.65 MIL/uL — ABNORMAL LOW (ref 3.87–5.11)
RDW: 21.3 % — ABNORMAL HIGH (ref 11.5–15.5)
WBC: 8.6 10*3/uL (ref 4.0–10.5)
nRBC: 0 % (ref 0.0–0.2)

## 2023-02-12 LAB — GLUCOSE, CAPILLARY
Glucose-Capillary: 145 mg/dL — ABNORMAL HIGH (ref 70–99)
Glucose-Capillary: 112 mg/dL — ABNORMAL HIGH (ref 70–99)
Glucose-Capillary: 117 mg/dL — ABNORMAL HIGH (ref 70–99)

## 2023-02-12 MED ORDER — MELATONIN 5 MG PO TABS: 5.0000 mg | ORAL_TABLET | Freq: Every evening | ORAL | Status: DC | PRN

## 2023-02-12 MED ORDER — PANTOPRAZOLE SODIUM 40 MG PO TBEC
40.0000 mg | DELAYED_RELEASE_TABLET | Freq: Every day | ORAL | Status: DC
  Administered 2023-02-13 – 2023-02-19 (×7): 40 mg via ORAL
  Filled 2023-02-12 (×7): qty 1

## 2023-02-12 MED ORDER — PROCHLORPERAZINE 25 MG RE SUPP: 12.5000 mg | Freq: Four times a day (QID) | RECTAL | Status: DC | PRN

## 2023-02-12 MED ORDER — INSULIN GLARGINE-YFGN 100 UNIT/ML ~~LOC~~ SOLN
20.0000 [IU] | Freq: Two times a day (BID) | SUBCUTANEOUS | Status: DC
  Administered 2023-02-12 – 2023-02-16 (×8): 20 [IU] via SUBCUTANEOUS
  Filled 2023-02-12 (×9): qty 0.2

## 2023-02-12 MED ORDER — POLYSACCHARIDE IRON COMPLEX 150 MG PO CAPS
150.0000 mg | ORAL_CAPSULE | Freq: Two times a day (BID) | ORAL | Status: DC
  Administered 2023-02-12 – 2023-02-18 (×13): 150 mg via ORAL
  Filled 2023-02-12 (×13): qty 1

## 2023-02-12 MED ORDER — DULOXETINE HCL 20 MG PO CPEP
20.0000 mg | ORAL_CAPSULE | Freq: Every day | ORAL | Status: DC
  Administered 2023-02-12 – 2023-02-16 (×5): 20 mg via ORAL
  Filled 2023-02-12 (×5): qty 1

## 2023-02-12 MED ORDER — MAGNESIUM GLUCONATE 500 MG PO TABS
250.0000 mg | ORAL_TABLET | Freq: Every day | ORAL | Status: DC
  Administered 2023-02-12 – 2023-02-18 (×7): 250 mg via ORAL
  Filled 2023-02-12 (×8): qty 1

## 2023-02-12 MED ORDER — DILTIAZEM HCL ER COATED BEADS 180 MG PO CP24
180.0000 mg | ORAL_CAPSULE | Freq: Every day | ORAL | Status: DC
  Administered 2023-02-13 – 2023-02-19 (×7): 180 mg via ORAL
  Filled 2023-02-12 (×7): qty 1

## 2023-02-12 MED ORDER — INSULIN ASPART 100 UNIT/ML IJ SOLN
0.0000 [IU] | Freq: Three times a day (TID) | INTRAMUSCULAR | Status: DC
  Administered 2023-02-13: 1 [IU] via SUBCUTANEOUS

## 2023-02-12 MED ORDER — MORPHINE SULFATE ER 15 MG PO TBCR
30.0000 mg | EXTENDED_RELEASE_TABLET | Freq: Two times a day (BID) | ORAL | Status: DC
  Administered 2023-02-12 – 2023-02-19 (×14): 30 mg via ORAL
  Filled 2023-02-12 (×15): qty 2

## 2023-02-12 MED ORDER — DIPHENHYDRAMINE HCL 25 MG PO CAPS: 25.0000 mg | ORAL_CAPSULE | Freq: Four times a day (QID) | ORAL | Status: DC | PRN

## 2023-02-12 MED ORDER — CYCLOBENZAPRINE HCL 5 MG PO TABS
10.0000 mg | ORAL_TABLET | Freq: Three times a day (TID) | ORAL | Status: DC | PRN
  Administered 2023-02-13 – 2023-02-16 (×4): 10 mg via ORAL
  Filled 2023-02-12 (×4): qty 2

## 2023-02-12 MED ORDER — FLEET ENEMA RE ENEM: 1.0000 | ENEMA | Freq: Once | RECTAL | Status: DC | PRN

## 2023-02-12 MED ORDER — INSULIN ASPART 100 UNIT/ML IJ SOLN
0.0000 [IU] | Freq: Every day | INTRAMUSCULAR | Status: DC
Start: 2023-02-12 — End: 2023-02-16

## 2023-02-12 MED ORDER — HYDROMORPHONE HCL 2 MG PO TABS
2.0000 mg | ORAL_TABLET | ORAL | Status: DC | PRN
  Administered 2023-02-12 – 2023-02-17 (×20): 2 mg via ORAL
  Filled 2023-02-12 (×20): qty 1

## 2023-02-12 MED ORDER — VITAMIN C 500 MG PO TABS
500.0000 mg | ORAL_TABLET | Freq: Every day | ORAL | Status: DC
  Administered 2023-02-12 – 2023-02-18 (×7): 500 mg via ORAL
  Filled 2023-02-12 (×7): qty 1

## 2023-02-12 MED ORDER — ALUM & MAG HYDROXIDE-SIMETH 200-200-20 MG/5ML PO SUSP: 30.0000 mL | ORAL | Status: DC | PRN

## 2023-02-12 MED ORDER — PROCHLORPERAZINE MALEATE 5 MG PO TABS: 5.0000 mg | ORAL_TABLET | Freq: Four times a day (QID) | ORAL | Status: DC | PRN

## 2023-02-12 MED ORDER — ACETAMINOPHEN 325 MG PO TABS
325.0000 mg | ORAL_TABLET | ORAL | Status: DC | PRN
Start: 2023-02-12 — End: 2023-02-19
  Administered 2023-02-14 – 2023-02-18 (×2): 650 mg via ORAL
  Filled 2023-02-12 (×2): qty 2

## 2023-02-12 MED ORDER — POLYETHYLENE GLYCOL 3350 17 G PO PACK
17.0000 g | PACK | Freq: Every day | ORAL | Status: DC
Start: 2023-02-13 — End: 2023-02-19
  Administered 2023-02-13 – 2023-02-14 (×2): 17 g via ORAL
  Filled 2023-02-12 (×6): qty 1

## 2023-02-12 MED ORDER — GABAPENTIN 400 MG PO CAPS
400.0000 mg | ORAL_CAPSULE | Freq: Four times a day (QID) | ORAL | Status: DC
  Administered 2023-02-12 – 2023-02-15 (×11): 400 mg via ORAL
  Filled 2023-02-12 (×11): qty 1

## 2023-02-12 MED ORDER — BISACODYL 10 MG RE SUPP: 10.0000 mg | Freq: Every day | RECTAL | Status: DC | PRN

## 2023-02-12 MED ORDER — PROCHLORPERAZINE EDISYLATE 10 MG/2ML IJ SOLN: 5.0000 mg | Freq: Four times a day (QID) | INTRAMUSCULAR | Status: DC | PRN

## 2023-02-12 MED ORDER — TRAMADOL HCL 50 MG PO TABS
50.0000 mg | ORAL_TABLET | Freq: Four times a day (QID) | ORAL | Status: DC | PRN
  Administered 2023-02-13 – 2023-02-18 (×13): 50 mg via ORAL
  Filled 2023-02-12 (×14): qty 1

## 2023-02-12 MED ORDER — APIXABAN 5 MG PO TABS
5.0000 mg | ORAL_TABLET | Freq: Two times a day (BID) | ORAL | Status: DC
  Administered 2023-02-12 – 2023-02-19 (×14): 5 mg via ORAL
  Filled 2023-02-12 (×14): qty 1

## 2023-02-12 MED ORDER — SENNA 8.6 MG PO TABS
2.0000 | ORAL_TABLET | Freq: Every day | ORAL | Status: DC
Start: 2023-02-12 — End: 2023-02-19
  Administered 2023-02-12 – 2023-02-18 (×7): 17.2 mg via ORAL
  Filled 2023-02-12 (×7): qty 2

## 2023-02-12 MED ORDER — SODIUM CHLORIDE 0.9% FLUSH: 3.0000 mL | Freq: Two times a day (BID) | INTRAVENOUS | Status: DC

## 2023-02-12 MED ORDER — GUAIFENESIN-DM 100-10 MG/5ML PO SYRP: 5.0000 mL | ORAL_SOLUTION | Freq: Four times a day (QID) | ORAL | Status: DC | PRN

## 2023-02-12 MED ORDER — TRAZODONE HCL 50 MG PO TABS
100.0000 mg | ORAL_TABLET | Freq: Every day | ORAL | Status: DC
  Administered 2023-02-12 – 2023-02-18 (×7): 100 mg via ORAL
  Filled 2023-02-12 (×7): qty 2

## 2023-02-12 NOTE — Progress Notes (Signed)
 Inpatient Rehabilitation Care Coordinator Assessment and Plan Patient Details  Name: Jennifer [name] MRN: [id number] Date of Birth: 09-19-1980  Today's Date: 02/12/2023  Hospital Problems: Active Problems:   * No active hospital problems. *  Past Medical History:  Past Medical History:  Diagnosis Date   Abnormal uterine bleeding    Due to fibriods/required transfusions   Acute blood loss anemia 10/13/2021   Acute kidney injury superimposed on chronic kidney disease (HCC) 10/13/2021   Acute lower limb ischemia 01/15/2023   Atrial fibrillation with RVR (HCC) 05/2022   underwent cardioversion by Dr. Perla   Closed fracture of coronoid process of right ulna 2011   Diabetes mellitus without complication (HCC)    Diabetic ketoacidosis associated with type 1 diabetes mellitus (HCC) 10/06/2015   Headache    Hyperglycemia without ketosis 10/21/2015   Hypertension    Hypertensive urgency 10/13/2021   Menorrhagia    OSA (obstructive sleep apnea)    Serum potassium elevated 02/05/2023   Past Surgical History:  Past Surgical History:  Procedure Laterality Date   AMPUTATION Right 01/17/2023   Procedure: AMPUTATION BELOW KNEE;  Surgeon: Elnor Norleen CROME, MD;  Location: [facility] ORS;  Service: Vascular;  Laterality: Right;   AMPUTATION Right 02/05/2023   Procedure: AMPUTATION ABOVE KNEE;  Surgeon: Magda Debby SAILOR, MD;  Location: [facility] OR;  Service: Vascular;  Laterality: Right;   CESAREAN SECTION     x2   LOWER EXTREMITY ANGIOGRAPHY Right 01/15/2023   Procedure: Lower Extremity Angiography;  Surgeon: Marea Selinda RAMAN, MD;  Location: [facility] INVASIVE CV LAB;  Service: Cardiovascular;  Laterality: Right;   none     Social History:  reports that she has been smoking. She has never used smokeless tobacco. She reports that she does not drink alcohol and does not use drugs.  Family / Support Systems Marital Status: Married Patient Roles: Spouse Spouse/Significant Other: Psychologist, Occupational (husband) Caregiver  Availability: 24/7  Social History Preferred language: English Religion: Forensic Psychologist - How often do you need to have someone help you when you read instructions, pamphlets, or other written material from your doctor or pharmacy?: Never   Abuse/Neglect Abuse/Neglect Assessment Can Be Completed: Yes Physical Abuse: Denies Verbal Abuse: Denies Sexual Abuse: Denies Exploitation of patient/patient's resources: Denies Self-Neglect: Denies  Patient response to: Social Isolation - How often do you feel lonely or isolated from those around you?: Never  Emotional Status    Patient / Family Perceptions, Expectations & Goals    Building Surveyor: None Premorbid Home Care/DME Agencies: None Transportation available at discharge: Husband Is the patient able to respond to transportation needs?: Yes In the past 12 months, has lack of transportation kept you from medical appointments or from getting medications?: No In the past 12 months, has lack of transportation kept you from meetings, work, or from getting things needed for daily living?: No Resource referrals recommended: Warden/ranger Family / Support Systems Marital Status: Married How Long?: n/a Patient Roles: Spouse, Parent Spouse/Significant Other: Warren Novak Children: n/a Other Supports: Apolinar and Chiffon Anticipated Caregiver: Goals, MOD I and spouse will be primary contact Ability/Limitations of Caregiver: spouse works M-F until 530 P Caregiver Availability: Evenings only Family Dynamics: supportive spouse and children   Social History Preferred language: English Religion: Christian Cultural Background: n/a Education: HS Health Literacy - How often do you need to have someone help you when you read instructions, pamphlets, or other written material from your doctor or pharmacy?: Never Writes: Yes Legal  History/Current Legal Issues: n/a Guardian/Conservator: n/a     Abuse/Neglect Abuse/Neglect Assessment Can Be Completed: Yes Physical Abuse: Denies Verbal Abuse: Denies Sexual Abuse: Denies Exploitation of patient/patient's resources: Denies Self-Neglect: Denies   Patient response to: Social Isolation - How often do you feel lonely or isolated from those around you?: Never   Emotional Status Pt's affect, behavior and adjustment status: n/a Recent Psychosocial Issues: coping Psychiatric History: n/a Substance Abuse History: n/a   Patient / Family Perceptions, Expectations & Goals Pt/Family understanding of illness & functional limitations: yes Premorbid pt/family roles/activities: Independent Anticipated changes in roles/activities/participation: Plans to remain MOD I Pt/family expectations/goals: MOD I   Building Surveyor: None Premorbid Home Care/DME Agencies: None Transportation available at discharge: spouse Is the patient able to respond to transportation needs?: Yes In the past 12 months, has lack of transportation kept you from medical appointments or from getting medications?: No In the past 12 months, has lack of transportation kept you from meetings, work, or from getting things needed for daily living?: No Resource referrals recommended: Neuropsychology   Discharge Planning Living Arrangements: Children, Spouse/significant other Support Systems: Children, Spouse/significant other Type of Residence: Private residence (1 level, 5 steps) Insurance Resources: Media Planner (specify) Financial Resources: Employment, Garment/textile Technologist Screen Referred: No Living Expenses: Rent Does the patient have any problems obtaining your medications?: No Home Management: Independent Patient/Family Preliminary Plans: Plans to continue to manage Care Coordinator Barriers to Discharge: Lack of/limited family support, Insurance for SNF coverage, Decreased caregiver support Care Coordinator Anticipated Follow Up  Needs: HH/OP  Clinical Impression SW met with pt in room to follow-up on plan of care at discharge. SW familiar with pt as she a return patient. Her goal is to work on getting out of the depression, and pain as she feels more enthusiastic and ready to participate in therapies when she is not in pain. Pt aware SW will follow-up with her husband.   *Pt has DME that need to be delivered-orders placed with [employer] via parachute.   31- SW spoke with pt husband Warren to introduce self, explain role, discuss discharge process, and inform on ELOS. SW informed will provide updates after team conference next week. Confirms support at home will be himself, and their son. Dtr PRN support as she is in school.   Lanise Mergen A Brian Kocourek 02/12/2023, 2:00 PM

## 2023-02-12 NOTE — Progress Notes (Signed)
  Progress Note    02/12/2023 9:23 AM 7 Days Post-Op  Subjective:  having a lot of stump pain and incisional pain    Vitals:   02/12/23 0451 02/12/23 0723  BP: 109/63 117/73  Pulse: 89 84  Resp: 18 18  Temp: 99.3 F (37.4 C) 98.9 F (37.2 C)  SpO2: 98% 99%   Physical Exam: Cardiac:  regular Lungs:  non labored Incisions:  right AKA Prevena vac removed. Incision is intact and well appearing. No drainage  Neurologic: alert and oriented  CBC    Component Value Date/Time   WBC 8.6 02/12/2023 0641   RBC 3.65 (L) 02/12/2023 0641   HGB 8.2 (L) 02/12/2023 0641   HGB 12.0 06/01/2013 1805   HCT 27.4 (L) 02/12/2023 0641   HCT 39.4 06/01/2013 1805   PLT 454 (H) 02/12/2023 0641   PLT 325 06/01/2013 1805   MCV 75.1 (L) 02/12/2023 0641   MCV 71 (L) 06/01/2013 1805   MCH 22.5 (L) 02/12/2023 0641   MCHC 29.9 (L) 02/12/2023 0641   RDW 21.3 (H) 02/12/2023 0641   RDW 18.1 (H) 06/01/2013 1805   LYMPHSABS 2.1 02/11/2023 0628   MONOABS 0.9 02/11/2023 0628   EOSABS 0.4 02/11/2023 0628   BASOSABS 0.0 02/11/2023 0628    BMET    Component Value Date/Time   NA 134 (L) 02/11/2023 0628   NA 135 (L) 06/01/2013 1805   K 4.3 02/11/2023 0628   K 3.6 06/01/2013 1805   CL 99 02/11/2023 0628   CL 103 06/01/2013 1805   CO2 25 02/11/2023 0628   CO2 25 06/01/2013 1805   GLUCOSE 154 (H) 02/11/2023 0628   GLUCOSE 179 (H) 06/01/2013 1805   BUN 24 (H) 02/11/2023 0628   BUN 11 06/01/2013 1805   CREATININE 1.41 (H) 02/11/2023 0628   CREATININE 0.79 06/01/2013 1805   CALCIUM  9.3 02/11/2023 0628   CALCIUM  9.1 06/01/2013 1805   GFRNONAA 47 (L) 02/11/2023 0628   GFRNONAA >60 06/01/2013 1805   GFRAA >60 10/08/2016 1820   GFRAA >60 06/01/2013 1805    INR    Component Value Date/Time   INR 1.0 01/15/2023 0925     Intake/Output Summary (Last 24 hours) at 02/12/2023 0923 Last data filed at 02/11/2023 2215 Gross per 24 hour  Intake 3 ml  Output --  Net 3 ml     Assessment/Plan:  43 y.o.  female is s/p right AKA 7 Days Post-Op   Right AKA pain Multimodal pain control Incisional vac removed today. Incision looks great. Will place Kerlix and ACE later this afternoon. Patient wanted some time for it to be open to air. I have asked RN to replace Follow up is arranged in 4 weeks on 1/28 for staple removal   Teretha Damme, PA-C [facility] [phone] 02/12/2023 9:23 AM

## 2023-02-12 NOTE — Progress Notes (Signed)
 Inpatient Rehabilitation Admissions Coordinator   I have insurance approval and CIR bed to admit her to today. I met with patient and she is in agreement. Acute team and TOC made aware. I will make the arrangements.  Heron Leavell, RN, MSN Rehab Admissions Coordinator [phone] 02/12/2023 10:27 AM

## 2023-02-12 NOTE — Discharge Summary (Signed)
 Physician Discharge Summary   Jennifer [name] FMW:[id number] DOB: Sep 28, 1980 DOA: 02/05/2023  PCP: [facility]  Admit date: 02/05/2023 Discharge date: 02/12/2023  Admitted From: CIR Disposition:  CIR Discharging physician: Alm Apo, MD Barriers to discharge: none  Recommendations at discharge: Staple removal with vascular on 1/28  Discharge Condition: stable CODE STATUS: Full Diet recommendation:  Diet Orders (From admission, onward)     Start     Ordered   02/05/23 1438  Diet regular Fluid consistency: Thin  Diet effective now       Question:  Fluid consistency:  Answer:  Thin   02/05/23 1439            Hospital Course: Jennifer [name] is a 43 y.o. 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   Patient initially seen at [facility] from 12/12 until 12/20 with right lower extremity ischemia underwent right lower extremity popliteal stenting and right BKA.  While there was noted to have A-fib and was started on anticoagulation with close monitoring in the setting of history of menorrhagia.  Also noted to have labile diabetes.   Ultimately discharged to CIR on 12/20 where she remained until 1/2.  While there she had continued leg pain and had issues with the operative site including worsening ischemic changes.  Vascular surgery followed and AKA was ultimately performed 02/05/23.  Assessment and Plan:  RLE limb ischemia - As per HPI patient initially seen at [facility] in December and underwent BKA and popliteal stenting of the right lower extremity.  Had been at inpatient rehab but had continued blistering and ischemic changes at her BKA site and ultimately had AKA performed, 02/05/23.  No operative complications reported - WV and stump sock removed per vascular on 02/12/23 - staple removal  with vascular on 1/28 - Patient does have some history with perioperative bleeding and menorrhagia so we will monitor closely - Appreciate vascular surgery recommendations and assistance - Continue with scheduled MS Contin  - Continue with as needed tramadol  for moderate pain and as needed p.o. Dilaudid  for severe pain - PT/OT eval and treat   Hypertension - Continue home diltiazem    Atrial fibrillation - Continue home diltiazem  -Was on Eliquis  from 01/18/2023 until 01/29/2023 then started on heparin  until surgery -Resuming Eliquis  on 02/10/2023.  Monitor hemoglobin for stability after resuming and any signs of bleeding (some vaginal spotting as expected, 02/11/2023 with mild hemoglobin drop).  Continue trending hemoglobin; has remained stable as of 1/9   Anemia Menorrhagia > Known history of vaginal bleeding in the setting fibroids and chronic anemia related to this > Hemoglobin now stable   Obesity - Noted   OSA - Continue CPAP   Principal Diagnosis: Limb ischemia  Discharge Diagnoses: Active Hospital Problems   Diagnosis Date Noted   Limb ischemia 01/15/2023    Priority: 1.   Menometrorrhagia 10/13/2021    Priority: 3.   PAF (paroxysmal atrial fibrillation) (HCC) 02/05/2023   Controlled type 2 diabetes mellitus without complication, without long-term current use of insulin  (HCC) 05/23/2022   HLD (hyperlipidemia) 05/23/2022   Anemia 10/15/2021   Diabetic peripheral neuropathy (HCC) 10/13/2021   OSA (obstructive sleep apnea) 10/25/2015   Obesity 10/25/2015   Essential hypertension 10/25/2015    Resolved Hospital Problems   Diagnosis Date Noted Date Resolved   Atrial fibrillation with RVR (HCC) 05/23/2022 02/05/2023   New onset atrial  fibrillation (HCC) 05/23/2022 02/05/2023      Allergies as of 02/12/2023       Reactions   Demerol  [meperidine ] Hives, Itching, Nausea And Vomiting   Meloxicam Nausea And Vomiting   Oxycodone  Hives, Itching, Nausea And Vomiting    Toradol  [ketorolac  Tromethamine ] Hives, Itching, Nausea And Vomiting        Allergies  Allergen Reactions   Demerol  [Meperidine ] Hives, Itching and Nausea And Vomiting   Meloxicam Nausea And Vomiting   Oxycodone  Hives, Itching and Nausea And Vomiting   Toradol  [Ketorolac  Tromethamine ] Hives, Itching and Nausea And Vomiting    Consultations:   Procedures:   Discharge Exam: BP 117/73 (BP Location: Left Arm)   Pulse 84   Temp 98.9 F (37.2 C) (Oral)   Resp 18   Wt 80.4 kg   SpO2 99%   BMI 27.76 kg/m  Physical Exam Constitutional:      General: She is not in acute distress.    Appearance: Normal appearance.  HENT:     Head: Normocephalic and atraumatic.     Mouth/Throat:     Mouth: Mucous membranes are moist.  Eyes:     Extraocular Movements: Extraocular movements intact.  Cardiovascular:     Rate and Rhythm: Normal rate and regular rhythm.  Pulmonary:     Effort: Pulmonary effort is normal. No respiratory distress.     Breath sounds: Normal breath sounds. No wheezing.  Abdominal:     General: Bowel sounds are normal. There is no distension.     Palpations: Abdomen is soft.     Tenderness: There is no abdominal tenderness.  Musculoskeletal:     Cervical back: Normal range of motion and neck supple.     Comments: R AKA noted with WV in place on stump  Skin:    General: Skin is warm and dry.  Neurological:     General: No focal deficit present.     Mental Status: She is alert.  Psychiatric:        Mood and Affect: Mood normal.      The results of significant diagnostics from this hospitalization (including imaging, microbiology, ancillary and laboratory) are listed below for reference.   Microbiology: No results found for this or any previous visit (from the past 240 hours).   Labs: BNP (last 3 results) No results for input(s): BNP in the last 8760 hours. Basic Metabolic Panel: Recent Labs  Lab 02/07/23 0647 02/08/23 0709 02/09/23 0602  02/10/23 0608 02/11/23 0628  NA 137 135 135 135 134*  K 4.9 4.7 4.4 4.8 4.3  CL 103 101 100 98 99  CO2 23 21* 23 23 25   GLUCOSE 105* 123* 124* 149* 154*  BUN 25* 19 17 20  24*  CREATININE 1.54* 1.34* 1.25* 1.48* 1.41*  CALCIUM  9.2 9.6 9.5 9.7 9.3  MG 1.9 1.9 1.7 2.1 1.9   Liver Function Tests: Recent Labs  Lab 02/06/23 0809  AST 16  ALT 14  ALKPHOS 82  BILITOT 0.5  PROT 7.9  ALBUMIN 2.4*   No results for input(s): LIPASE, AMYLASE in the last 168 hours. No results for input(s): AMMONIA in the last 168 hours. CBC: Recent Labs  Lab 02/07/23 0647 02/08/23 0709 02/09/23 0602 02/10/23 0608 02/11/23 0628 02/12/23 0641  WBC 11.3* 10.7* 9.1 10.1 9.0 8.6  NEUTROABS 8.2* 6.1 5.3 6.4 5.6  --   HGB 7.5* 8.1* 7.7* 8.2* 7.2* 8.2*  HCT 24.8* 28.1* 26.0* 27.8* 24.7* 27.4*  MCV 74.7* 77.8* 74.9* 76.0* 75.1*  75.1*  PLT 535* 539* 494* 533* 451* 454*   Cardiac Enzymes: No results for input(s): CKTOTAL, CKMB, CKMBINDEX, TROPONINI in the last 168 hours. BNP: Invalid input(s): POCBNP CBG: Recent Labs  Lab 02/10/23 1032 02/10/23 2128 02/11/23 0924 02/11/23 1429 02/12/23 0838  GLUCAP 190* 191* 141* 111* 145*   D-Dimer No results for input(s): DDIMER in the last 72 hours. Hgb A1c No results for input(s): HGBA1C in the last 72 hours. Lipid Profile No results for input(s): CHOL, HDL, LDLCALC, TRIG, CHOLHDL, LDLDIRECT in the last 72 hours. Thyroid  function studies No results for input(s): TSH, T4TOTAL, T3FREE, THYROIDAB in the last 72 hours.  Invalid input(s): FREET3 Anemia work up No results for input(s): VITAMINB12, FOLATE, FERRITIN, TIBC, IRON , RETICCTPCT in the last 72 hours. Urinalysis    Component Value Date/Time   COLORURINE YELLOW 01/27/2023 0157   APPEARANCEUR CLEAR 01/27/2023 0157   APPEARANCEUR Hazy 05/28/2013 2350   LABSPEC 1.009 01/27/2023 0157   LABSPEC 1.015 05/28/2013 2350   PHURINE 5.0 01/27/2023 0157    GLUCOSEU NEGATIVE 01/27/2023 0157   GLUCOSEU >=500 05/28/2013 2350   HGBUR LARGE (A) 01/27/2023 0157   BILIRUBINUR NEGATIVE 01/27/2023 0157   BILIRUBINUR Negative 05/28/2013 2350   KETONESUR NEGATIVE 01/27/2023 0157   PROTEINUR 30 (A) 01/27/2023 0157   NITRITE NEGATIVE 01/27/2023 0157   LEUKOCYTESUR TRACE (A) 01/27/2023 0157   LEUKOCYTESUR Negative 05/28/2013 2350   Sepsis Labs Recent Labs  Lab 02/09/23 0602 02/10/23 0608 02/11/23 0628 02/12/23 0641  WBC 9.1 10.1 9.0 8.6   Microbiology No results found for this or any previous visit (from the past 240 hours).  Procedures/Studies: DG Chest Port 1 View Result Date: 01/23/2023 CLINICAL DATA:  Vasculitis. EXAM: PORTABLE CHEST 1 VIEW COMPARISON:  05/26/2022 FINDINGS: The lungs are clear without focal pneumonia, edema, pneumothorax or pleural effusion. The cardiopericardial silhouette is within normal limits for size. No acute bony abnormality. IMPRESSION: No active disease. Electronically Signed   By: Camellia Candle M.D.   On: 01/23/2023 14:55   DG Knee 1-2 Views Right Result Date: 01/19/2023 CLINICAL DATA:  Right knee pain EXAM: RIGHT KNEE - 1-2 VIEW COMPARISON:  None Available. FINDINGS: Normal alignment. No acute fracture or dislocation. Osteotomy noted within the visualized proximal fibular diaphysis. No effusion. Vascular stent graft noted within the adductor hiatus and popliteal artery. IMPRESSION: 1. No acute fracture or dislocation. Electronically Signed   By: Dorethia Molt M.D.   On: 01/19/2023 21:03   US  OR NERVE BLOCK-IMAGE ONLY [facility]) Result Date: 01/17/2023 There is no interpretation for this exam.  This order is for images obtained during a surgical procedure.  Please See Surgeries Tab for more information regarding the procedure.   ECHOCARDIOGRAM COMPLETE Result Date: 01/16/2023    ECHOCARDIOGRAM REPORT   Patient Name:   GRAHAM DOUKAS Date of Exam: 01/16/2023 Medical Rec #:  [id number]       Height:       66.0 in  Accession #:    [id number]      Weight:       213.0 lb Date of Birth:  27-Sep-1980       BSA:          2.054 m Patient Age:    42 years        BP:           142/80 mmHg Patient Gender: F               HR:  110 bpm. Exam Location:  [facility] Procedure: 2D Echo, Cardiac Doppler and Color Doppler Indications:     Atrial Fibrillation  History:         Patient has prior history of Echocardiogram examinations, most                  recent 05/24/2022. Arrythmias:Atrial Fibrillation; Risk                  Factors:Hypertension, Sleep Apnea, Diabetes, Dyslipidemia and                  Current Smoker.  Sonographer:     Naomie Reef Referring Phys:  [id number] SELINDA RAMAN DEW Diagnosing Phys: Redell Cave MD  Sonographer Comments: Image acquisition challenging due to respiratory motion. IMPRESSIONS  1. Left ventricular ejection fraction, by estimation, is 65 to 70%. The left ventricle has normal function. The left ventricle has no regional wall motion abnormalities. There is mild left ventricular hypertrophy. Left ventricular diastolic parameters are consistent with Grade I diastolic dysfunction (impaired relaxation).  2. Right ventricular systolic function is normal. The right ventricular size is normal. There is normal pulmonary artery systolic pressure.  3. The mitral valve is normal in structure. No evidence of mitral valve regurgitation.  4. The aortic valve is tricuspid. Aortic valve regurgitation is not visualized.  5. The inferior vena cava is normal in size with greater than 50% respiratory variability, suggesting right atrial pressure of 3 mmHg. FINDINGS  Left Ventricle: Left ventricular ejection fraction, by estimation, is 65 to 70%. The left ventricle has normal function. The left ventricle has no regional wall motion abnormalities. The left ventricular internal cavity size was normal in size. There is  mild left ventricular hypertrophy. Left ventricular diastolic parameters are consistent with Grade I diastolic  dysfunction (impaired relaxation). Right Ventricle: The right ventricular size is normal. No increase in right ventricular wall thickness. Right ventricular systolic function is normal. There is normal pulmonary artery systolic pressure. The tricuspid regurgitant velocity is 1.51 m/s, and  with an assumed right atrial pressure of 3 mmHg, the estimated right ventricular systolic pressure is 12.1 mmHg. Left Atrium: Left atrial size was normal in size. Right Atrium: Right atrial size was normal in size. Pericardium: There is no evidence of pericardial effusion. Mitral Valve: The mitral valve is normal in structure. No evidence of mitral valve regurgitation. MV peak gradient, 9.0 mmHg. The mean mitral valve gradient is 3.0 mmHg. Tricuspid Valve: The tricuspid valve is normal in structure. Tricuspid valve regurgitation is not demonstrated. Aortic Valve: The aortic valve is tricuspid. Aortic valve regurgitation is not visualized. Aortic valve mean gradient measures 8.0 mmHg. Aortic valve peak gradient measures 16.6 mmHg. Aortic valve area, by VTI measures 2.78 cm. Pulmonic Valve: The pulmonic valve was not well visualized. Pulmonic valve regurgitation is not visualized. Aorta: The aortic root is normal in size and structure. Venous: The inferior vena cava is normal in size with greater than 50% respiratory variability, suggesting right atrial pressure of 3 mmHg. IAS/Shunts: No atrial level shunt detected by color flow Doppler.  LEFT VENTRICLE PLAX 2D LVIDd:         4.00 cm   Diastology LVIDs:         2.80 cm   LV e' medial:    6.42 cm/s LV PW:         1.60 cm   LV E/e' medial:  10.8 LV IVS:        1.50 cm  LV e' lateral:   9.14 cm/s LVOT diam:     2.00 cm   LV E/e' lateral: 7.6 LV SV:         95 LV SV Index:   46 LVOT Area:     3.14 cm  RIGHT VENTRICLE RV Basal diam:  3.30 cm RV Mid diam:    3.30 cm RV S prime:     23.00 cm/s TAPSE (M-mode): 2.3 cm LEFT ATRIUM             Index        RIGHT ATRIUM           Index LA  diam:        3.70 cm 1.80 cm/m   RA Area:     14.50 cm LA Vol (A2C):   62.0 ml 30.18 ml/m  RA Volume:   36.30 ml  17.67 ml/m LA Vol (A4C):   41.7 ml 20.30 ml/m LA Biplane Vol: 52.7 ml 25.66 ml/m  AORTIC VALVE                     PULMONIC VALVE AV Area (Vmax):    2.57 cm      PV Vmax:       1.50 m/s AV Area (Vmean):   2.44 cm      PV Peak grad:  9.0 mmHg AV Area (VTI):     2.78 cm AV Vmax:           204.00 cm/s AV Vmean:          130.000 cm/s AV VTI:            0.341 m AV Peak Grad:      16.6 mmHg AV Mean Grad:      8.0 mmHg LVOT Vmax:         167.00 cm/s LVOT Vmean:        101.000 cm/s LVOT VTI:          0.302 m LVOT/AV VTI ratio: 0.89  AORTA Ao Root diam: 3.40 cm MITRAL VALVE                TRICUSPID VALVE MV Area (PHT): 4.93 cm     TR Peak grad:   9.1 mmHg MV Area VTI:   3.82 cm     TR Vmax:        151.00 cm/s MV Peak grad:  9.0 mmHg MV Mean grad:  3.0 mmHg     SHUNTS MV Vmax:       1.50 m/s     Systemic VTI:  0.30 m MV Vmean:      82.5 cm/s    Systemic Diam: 2.00 cm MV Decel Time: 154 msec MV E velocity: 69.20 cm/s MV A velocity: 148.00 cm/s MV E/A ratio:  0.47 Redell Cave MD Electronically signed by Redell Cave MD Signature Date/Time: 01/16/2023/4:23:22 PM    Final    PERIPHERAL VASCULAR CATHETERIZATION Result Date: 01/15/2023 See surgical note for result.    Time coordinating discharge: Over 30 minutes    Alm Apo, MD  [facility] 02/12/2023, 11:02 AM

## 2023-02-12 NOTE — Plan of Care (Signed)

## 2023-02-12 NOTE — Progress Notes (Signed)
 Occupational Therapy Treatment Patient Details Name: Jennifer [name] MRN: [id number] DOB: February 15, 1980 Today's Date: 02/12/2023   History of present illness Pt is a 43 y.o. 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   OT comments  Pt remains limited by pain and fatigue, she reports feeling more tired recently than usual. Check pt labs and her hemoglobin has been on the lower end, suspect this may be impacting her full performance although she is always willing to engage in therapy to the best of her abilities. Opted to perform BUE strengthening exercises today with green therabands in preparation for hop to gait mobility. OT to continue to progress pt as able. DC plans remain appropriate for CIR readmit.       If plan is discharge home, recommend the following:  A little help with bathing/dressing/bathroom;Assistance with cooking/housework;Assist for transportation;Help with stairs or ramp for entrance   Equipment Recommendations  BSC/3in1;Wheelchair (measurements OT);Wheelchair cushion (measurements OT)    Recommendations for Other Services      Precautions / Restrictions Precautions Precautions: Fall Restrictions Weight Bearing Restrictions Per Provider Order: Yes RLE Weight Bearing Per Provider Order: Non weight bearing Other Position/Activity Restrictions: s/p R AKA       Mobility Bed Mobility Overal bed mobility: Needs Assistance Bed Mobility: Supine to Sit, Sit to Supine     Supine to sit: Supervision Sit to supine: Supervision   General bed mobility comments: needs significantly increased time to get to/from EOB    Transfers                         Balance Overall balance assessment: Needs assistance Sitting-balance support: Feet supported Sitting balance-Leahy Scale: Good Sitting  balance - Comments: sitting EOB                                   ADL either performed or assessed with clinical judgement   ADL                                         General ADL Comments: Focused session on UB strengthening with therabands in preparation for building UB endurance to increase lift off RW for transfers/ hop to gait    Extremity/Trunk Assessment              Vision       Perception     Praxis      Cognition Arousal: Alert Behavior During Therapy: WFL for tasks assessed/performed Overall Cognitive Status: Within Functional Limits for tasks assessed                                          Exercises General Exercises - Upper Extremity Shoulder Flexion: AROM, 20 reps, Both, Theraband, Seated (2 sets of 10) Theraband Level (Shoulder Flexion): Level 3 (Green) Shoulder Horizontal ABduction: AROM, Both, Seated, 20 reps, Theraband (2 sets of 10) Theraband Level (Shoulder Horizontal Abduction): Level 3 (Green) Elbow Flexion: AROM, Both, Theraband, Strengthening, 20 reps (2 sets of 10) Theraband Level (Elbow Flexion): Level  3 (Green) Elbow Extension: AROM, Strengthening, Both, 20 reps, Seated, Theraband (2 sets of 10) Theraband Level (Elbow Extension): Level 3 (Green)    Shoulder Instructions       General Comments      Pertinent Vitals/ Pain       Pain Assessment Pain Assessment: Faces Faces Pain Scale: Hurts even more Pain Location: RLE with some movement Pain Descriptors / Indicators: Grimacing, Guarding, Discomfort, Tightness Pain Intervention(s): Limited activity within patient's tolerance, Monitored during session  Home Living                                          Prior Functioning/Environment              Frequency  Min 1X/week        Progress Toward Goals  OT Goals(current goals can now be found in the care plan section)  Progress towards OT goals:  Progressing toward goals  Acute Rehab OT Goals Patient Stated Goal: to have decreased pain OT Goal Formulation: With patient Time For Goal Achievement: 02/20/23 Potential to Achieve Goals: Good  Plan      Co-evaluation                 AM-PAC OT 6 Clicks Daily Activity     Outcome Measure   Help from another person eating meals?: None Help from another person taking care of personal grooming?: A Little Help from another person toileting, which includes using toliet, bedpan, or urinal?: A Little Help from another person bathing (including washing, rinsing, drying)?: A Little Help from another person to put on and taking off regular upper body clothing?: A Little Help from another person to put on and taking off regular lower body clothing?: A Lot 6 Click Score: 18    End of Session    OT Visit Diagnosis: Pain;Other abnormalities of gait and mobility (R26.89);Unsteadiness on feet (R26.81) Pain - Right/Left: Right Pain - part of body:  (AKA)   Activity Tolerance Patient limited by fatigue   Patient Left in bed;with call bell/phone within reach   Nurse Communication Mobility status        Time: 8851-8787 OT Time Calculation (min): 24 min  Charges: OT General Charges $OT Visit: 1 Visit OT Treatments $Therapeutic Activity: 8-22 mins $Therapeutic Exercise: 8-22 mins  02/12/2023  AB, OTR/L  Acute Rehabilitation Services  Office: [phone]   Jennifer [name] 02/12/2023, 2:25 PM

## 2023-02-12 NOTE — Progress Notes (Signed)
 Emeline Joesph BROCKS, DO  Physician Physical Medicine and Rehabilitation   PMR Pre-admission    Signed   Date of Service: 02/11/2023  8:44 AM  Related encounter: Admission (Discharged) from 02/05/2023 in [place] [facility]   Signed     Expand All Collapse All  Show:Clear all [x] Written[x] Templated[x] Copied  Added by: [x] Alison Heron MATSU, RN[x] Emeline Joesph BROCKS, DO  [] Hover for details PMR Admission Coordinator Pre-Admission Assessment   Patient: Jennifer [name] is an 43 y.o., female MRN: [id number] DOB: 11-Nov-1980 Height:   Weight: 80.4 kg   Insurance Information HMO:     PPO:      PCP:      IPA:      80/20:      OTHER:  PRIMARY: [place] Medicaid BCBS Healthy Blue      Policy#: [id number]      Subscriber: pt CM Name: Andres      Phone#: [phone]     Fax#: [phone] Pre-Cert#: [id number] approved 1/9 until 1/15      Employer:  Benefits:  Phone #: [phone]     Name: 1/7 Eff. Date: 05/05/22 until 04/02/24     Deduct: none      Out of Pocket Max: none      Life Max: none CIR: per medicaid      SNF: per medicaid Outpatient: $40 co pay per visit     Co-Pay:  Home Health: per medicaid      Co-Pay:  DME: per medicaid     Co-Pay:  Providers: in network  SECONDARY: none   Financial Counselor:       Phone#:    The Data Processing Manager" for patients in Inpatient Rehabilitation Facilities with attached "Privacy Act Statement-Health Care Records" was provided and verbally reviewed with: Patient   Emergency Contact Information Contact Information       Name Relation Home Work Mobile    King,Jamie Spouse     [phone]         Other Contacts       Name Relation Home Work Mobile    Allison,Wanda Relative     [phone]    King,Chiffon S Relative [phone]               Current Medical History  Patient Admitting Diagnosis: AKA   History of Present Illness: Jennifer [name] is a 43 year old female with history of T1 DM, HTN, A  flutter, ongoing tobacco use who was admitted on [facility] on 01/15/23 with 2 day history of RLE pain with coldness, numbness and difficulty walking. She was found to have ischemic RLE, lack of DP/PT on dopplers, was anemic with hemoglobin 7.5 and BS 602 at admission. She was started on IV heparin  and  taken to OR for aortogram with mechanical thrombectomy to right anterior tibial artery and stent placement and right popliteal artery on the same day by Dr. Marea.  She  had drop in hemoglobin to 5.9 and was transfused with 2 units PRBC.  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 Elnor.  Postop pain continue to be an issue.  She was tried on Lyrica  but then this was changed back to gabapentin .  She required additional units PRBC on 12/14 with most recent CBC revealing H&H at 8.0 and 27.6. Admitted to CIR on 01/23/23.    Readmitted to acute at [facility] hospital on 02/05/23 with continued leg pain and issues  with the operative site including worsening ischemic changes. Vascular surgery followed and AKA performed on 1/2. Postoperative pain management with MS contin , tramadol  and Dilaudid . Continue with home diltiazem  for HTN. Resumed ELiquis  on 1/7 and will monitor Hgb . Continue with iron  supplementation. History of vaginal bleeding in the setting of fibroids.   Patient's medical record from [facility] has been reviewed by the rehabilitation admission coordinator and physician.   Past Medical History      Past Medical History:  Diagnosis Date   Abnormal uterine bleeding      Due to fibriods/required transfusions   Acute blood loss anemia 10/13/2021   Acute kidney injury superimposed on chronic kidney disease (HCC) 10/13/2021   Acute lower limb ischemia 01/15/2023   Atrial fibrillation with RVR (HCC) 05/2022    underwent cardioversion by Dr. Perla   Closed fracture of coronoid process of right ulna 2011   Diabetes mellitus without complication (HCC)      Diabetic ketoacidosis associated with type 1 diabetes mellitus (HCC) 10/06/2015   Headache     Hyperglycemia without ketosis 10/21/2015   Hypertension     Hypertensive urgency 10/13/2021   Menorrhagia     OSA (obstructive sleep apnea)     Serum potassium elevated 02/05/2023        Has the patient had major surgery during 100 days prior to admission? Yes   Family History   family history includes Diabetes in an other family member; Hypertension in an other family member. She was adopted.   Current Medications  Current Medications    Current Facility-Administered Medications:    acetaminophen  (TYLENOL ) tablet 650 mg, 650 mg, Oral, Q6H PRN **OR** acetaminophen  (TYLENOL ) suppository 650 mg, 650 mg, Rectal, Q6H PRN, Melvin, Alexander B, MD   apixaban  (ELIQUIS ) tablet 5 mg, 5 mg, Oral, BID, Girguis, David, MD, 5 mg at 02/12/23 9166   cyclobenzaprine  (FLEXERIL ) tablet 10 mg, 10 mg, Oral, TID PRN, Melvin, Alexander B, MD, 10 mg at 02/11/23 1306   diltiazem  (CARDIZEM  CD) 24 hr capsule 180 mg, 180 mg, Oral, Daily, Melvin, Alexander B, MD, 180 mg at 02/12/23 9166   gabapentin  (NEURONTIN ) capsule 400 mg, 400 mg, Oral, QID, Melvin, Alexander B, MD, 400 mg at 02/12/23 9166   HYDROmorphone  (DILAUDID ) injection 2 mg, 2 mg, Intravenous, Q4H PRN, Girguis, David, MD, 2 mg at 02/12/23 0556   insulin  glargine-yfgn (SEMGLEE ) injection 20 Units, 20 Units, Subcutaneous, BID, Melvin, Alexander B, MD, 20 Units at 02/12/23 9165   iron  polysaccharides (NIFEREX) capsule 150 mg, 150 mg, Oral, BID AC, Melvin, Alexander B, MD, 150 mg at 02/11/23 1757   magnesium  gluconate (MAGONATE) tablet 250 mg, 250 mg, Oral, QHS, Melvin, Alexander B, MD, 250 mg at 02/11/23 2213   morphine  (MS CONTIN ) 12 hr tablet 30 mg, 30 mg, Oral, Q12H, Melvin, Alexander B, MD, 30 mg at 02/12/23 9166   pantoprazole  (PROTONIX ) EC tablet 40 mg, 40 mg, Oral, Daily, Melvin, Alexander B, MD, 40 mg at 02/12/23 9166   polyethylene glycol (MIRALAX  /  GLYCOLAX ) packet 17 g, 17 g, Oral, Daily, Melvin, Alexander B, MD, 17 g at 02/10/23 1025   senna (SENOKOT) tablet 17.2 mg, 2 tablet, Oral, QHS, Melvin, Alexander B, MD, 17.2 mg at 02/10/23 2129   sodium chloride  flush (NS) 0.9 % injection 3 mL, 3 mL, Intravenous, Q12H, Melvin, Alexander B, MD, 3 mL at 02/12/23 0834   traMADol  (ULTRAM ) tablet 50 mg, 50 mg, Oral, Q6H PRN, Girguis, David, MD, 50 mg at 02/11/23  1605   traZODone  (DESYREL ) tablet 100 mg, 100 mg, Oral, QHS, Melvin, Alexander B, MD, 100 mg at 02/11/23 2215     Patients Current Diet:  Diet Order                  Diet regular Fluid consistency: Thin  Diet effective now                         Precautions / Restrictions Precautions Precautions: Fall Restrictions Weight Bearing Restrictions Per Provider Order: Yes RLE Weight Bearing Per Provider Order: Non weight bearing Other Position/Activity Restrictions: s/p R AKA    Has the patient had 2 or more falls or a fall with injury in the past year? No   Prior Activity Level Limited Community (1-2x/wk): Independent with no DME at baseline   Prior Functional Level Self Care: Did the patient need help bathing, dressing, using the toilet or eating? Independent   Indoor Mobility: Did the patient need assistance with walking from room to room (with or without device)? Independent   Stairs: Did the patient need assistance with internal or external stairs (with or without device)? Independent   Functional Cognition: Did the patient need help planning regular tasks such as shopping or remembering to take medications? Independent   Patient Information Are you of Hispanic, Latino/a,or Spanish origin?: A. No, not of Hispanic, Latino/a, or Spanish origin What is your race?: B. Black or African American Do you need or want an interpreter to communicate with a doctor or health care staff?: 0. No   Patient's Response To:  Health Literacy and Transportation Is the patient able to  respond to health literacy and transportation needs?: Yes Health Literacy - How often do you need to have someone help you when you read instructions, pamphlets, or other written material from your doctor or pharmacy?: Never In the past 12 months, has lack of transportation kept you from medical appointments or from getting medications?: No In the past 12 months, has lack of transportation kept you from meetings, work, or from getting things needed for daily living?: No   Home Assistive Devices / Equipment Home Equipment: Crutches   Prior Device Use: Indicate devices/aids used by the patient prior to current illness, exacerbation or injury? None of the above   Current Functional Level Cognition   Overall Cognitive Status: Within Functional Limits for tasks assessed Orientation Level: Oriented X4 General Comments: motivated to participate, asking about going back to CIR    Extremity Assessment (includes Sensation/Coordination)   Upper Extremity Assessment: Overall WFL for tasks assessed  Lower Extremity Assessment: RLE deficits/detail RLE Deficits / Details: s/p R AKA RLE: Unable to fully assess due to pain     ADLs   Overall ADL's : Needs assistance/impaired Eating/Feeding: Independent, Sitting Grooming: Sitting, Set up Upper Body Bathing: Sitting, Set up Lower Body Bathing: Contact guard assist, Sit to/from stand Lower Body Bathing Details (indicate cue type and reason): simulated via LB pericare in standing Lower Body Dressing: Bed level, Moderate assistance Toilet Transfer: Minimal assistance, BSC/3in1, Stand-pivot, Rolling walker (2 wheels), Cueing for safety, Cueing for sequencing Toilet Transfer Details (indicate cue type and reason): stand pivot from EOB<>BSC wtih rw and MIN A d/t increased pain Toileting- Clothing Manipulation and Hygiene: Contact guard assist, Sit to/from stand Toileting - Clothing Manipulation Details (indicate cue type and reason): able to complete  anterior pericare after continent urine void in standing with CGA for balance and unilateral support from  RW Functional mobility during ADLs: Minimal assistance, Rolling walker (2 wheels), Cueing for safety, Cueing for sequencing (stand pivot only) General ADL Comments: ADL participation greatly limited by pain this session     Mobility   Overal bed mobility: Needs Assistance Bed Mobility: Supine to Sit Supine to sit: Supervision Sit to supine: Min assist General bed mobility comments: Pt received sitting on EOB, uses BUE to assist RLE to square up to sitting on EOB.     Transfers   Overall transfer level: Needs assistance Equipment used: Rolling walker (2 wheels) Transfers: Sit to/from Stand Sit to Stand: Contact guard assist, Supervision Bed to/from chair/wheelchair/BSC transfer type:: Stand pivot Stand pivot transfers: Contact guard assist Squat pivot transfers: Contact guard assist Step pivot transfers: Min assist General transfer comment: STS from EOB, recliner with CGA fade to supervision with good ability to push to standing with 1-2 UE. Stand pivot bed>recliner with RW & CGA with pt scooting L foot along floor vs picking it up/hopping to complete transfer.     Ambulation / Gait / Stairs / Wheelchair Mobility   Ambulation/Gait Ambulation/Gait assistance: Contact guard assist Gait Distance (Feet): 5 Feet (~2.5 ft forwards & backwards with RW) Assistive device: Rolling walker (2 wheels) Gait Pattern/deviations: Step-to pattern General Gait Details: Pt does attempt hopping forward on L foot x 1 time but notes increased R AKA pain with jarring movement. Pt instead scoots L foot along floor. Pt self limits gait distance 2/2 hoping to not become lightheaded with mobility noting this happened yesterday. Gait velocity: decreased Gait velocity interpretation: <1.31 ft/sec, indicative of household Actuary propulsion: Both upper extremities Wheelchair  Assistance Details (indicate cue type and reason): max verbal cues for technique (pivots, propulsion, retro etc). max assist needed to manage WC parts (brakes, arm rest)     Posture / Balance Dynamic Sitting Balance Sitting balance - Comments: sitting EOB Balance Overall balance assessment: Needs assistance Sitting-balance support: Feet supported Sitting balance-Leahy Scale: Good Sitting balance - Comments: sitting EOB Standing balance support: Single extremity supported, During functional activity, Reliant on assistive device for balance Standing balance-Leahy Scale: Poor Standing balance comment: Pt able to pull underwear over hips in standing with 1 UE support & CGA<>min assist from PT.     Special needs/care consideration CIR 12/20 until 1/2 and readmitted to acute                                                              CPAP at [facility]   Previous Home Environment  Living Arrangements: Children, Spouse/significant other  Lives With: Spouse, Other (Comment) (47 y/o and 39 y/o) Available Help at Discharge: Family, Available PRN/intermittently Type of Home: House Home Layout: One level Home Access: Stairs to enter Entrance Stairs-Rails: Right Entrance Stairs-Number of Steps: 4 Bathroom Shower/Tub: Tub/shower unit, Buyer, Retail: Yes   Discharge Living Setting Plans for Discharge Living Setting: Patient's home, House Type of Home at Discharge: House Discharge Home Layout: One level Discharge Home Access: Stairs to enter Entrance Stairs-Rails: Left Entrance Stairs-Number of Steps: 4-5 Discharge Bathroom Shower/Tub: Tub/shower unit Discharge Bathroom Toilet: Standard Discharge Bathroom Accessibility: No Does the patient have any problems obtaining your medications?: No   Social/Family/Support Systems Patient Roles: Spouse, Parent Anticipated Industrial/product Designer Information: see  contacts Ability/Limitations of Caregiver: spouse  works Medical Laboratory Scientific Officer: Evenings only Discharge Plan Discussed with Primary Caregiver: Yes Is Caregiver In Agreement with Plan?: Yes Does Caregiver/Family have Issues with Lodging/Transportation while Pt is in Rehab?: No   Goals Patient/Family Goal for Rehab: Mod I with PT and OT Expected length of stay: ELOS 5 to 7 days Pt/Family Agrees to Admission and willing to participate: Yes Program Orientation Provided & Reviewed with Pt/Caregiver Including Roles  & Responsibilities: Yes   Decrease burden of Care through IP rehab admission: n/a   Possible need for SNF placement upon discharge: not anticipated   Patient Condition: I have reviewed medical records from [facility], spoken with  patient. I met with patient at the bedside for inpatient rehabilitation assessment.  Patient will benefit from ongoing PT and OT, can actively participate in 3 hours of therapy a day 5 days of the week, and can make measurable gains during the admission.  Patient will also benefit from the coordinated team approach during an Inpatient Acute Rehabilitation admission.  The patient will receive intensive therapy as well as Rehabilitation physician, nursing, social worker, and care management interventions.  Due to bladder management, bowel management, safety, skin/wound care, disease management, medication administration, pain management, and patient education the patient requires 24 hour a day rehabilitation nursing.  The patient is currently min assist overall with mobility and basic ADLs.  Discharge setting and therapy post discharge at home with home health is anticipated.  Patient has agreed to participate in the Acute Inpatient Rehabilitation Program and will admit today.   Preadmission Screen Completed By:  Alison Heron Lot, RN MSN 02/12/2023 10:30 AM ______________________________________________________________________   Discussed status with Dr. Emeline on 02/12/23 at 1031 and received approval  for admission today.   Admission Coordinator:  Alison Heron Lot, RN MSN time [id number] Date 02/12/23    Assessment/Plan: Diagnosis: Right AKA Does the need for close, 24 hr/day Medical supervision in concert with the patient's rehab needs make it unreasonable for this patient to be served in a less intensive setting? Yes Co-Morbidities requiring supervision/potential complications: Phantom and post-surgical limb pain, hypertension, OSA, anemia/menorrhagia, diabetes, and atrial fibrillation Due to safety, skin/wound care, disease management, medication administration, pain management, and patient education, does the patient require 24 hr/day rehab nursing? Yes Does the patient require coordinated care of a physician, rehab nurse, PT, OT to address physical and functional deficits in the context of the above medical diagnosis(es)? Yes Addressing deficits in the following areas: balance, endurance, locomotion, strength, transferring, bathing, dressing, grooming, and toileting Can the patient actively participate in an intensive therapy program of at least 3 hrs of therapy 5 days a week? Yes The potential for patient to make measurable gains while on inpatient rehab is good Anticipated functional outcomes upon discharge from inpatient rehab: modified independent PT, modified independent OT Estimated rehab length of stay to reach the above functional goals is: 5-7 days Anticipated discharge destination: Home 10. Overall Rehab/Functional Prognosis: good     MD Signature:   Joesph JAYSON Emeline, DO 02/12/2023           Revision History

## 2023-02-13 LAB — COMPREHENSIVE METABOLIC PANEL
ALT: 10 U/L (ref 0–44)
AST: 12 U/L — ABNORMAL LOW (ref 15–41)
Albumin: 2.4 g/dL — ABNORMAL LOW (ref 3.5–5.0)
Alkaline Phosphatase: 81 U/L (ref 38–126)
Anion gap: 11 (ref 5–15)
BUN: 18 mg/dL (ref 6–20)
CO2: 24 mmol/L (ref 22–32)
Calcium: 9.2 mg/dL (ref 8.9–10.3)
Chloride: 97 mmol/L — ABNORMAL LOW (ref 98–111)
Creatinine, Ser: 1.46 mg/dL — ABNORMAL HIGH (ref 0.44–1.00)
GFR, Estimated: 45 mL/min — ABNORMAL LOW (ref >60)
Glucose, Bld: 118 mg/dL — ABNORMAL HIGH (ref 70–99)
Potassium: 4.2 mmol/L (ref 3.5–5.1)
Sodium: 132 mmol/L — ABNORMAL LOW (ref 135–145)
Total Bilirubin: 0.5 mg/dL (ref 0.0–1.2)
Total Protein: 7.5 g/dL (ref 6.5–8.1)

## 2023-02-13 LAB — CBC WITH DIFFERENTIAL/PLATELET
Abs Immature Granulocytes: 0.02 10*3/uL (ref 0.00–0.07)
Basophils Absolute: 0 10*3/uL (ref 0.0–0.1)
Basophils Relative: 0 %
Eosinophils Absolute: 0.3 10*3/uL (ref 0.0–0.5)
Eosinophils Relative: 4 %
HCT: 25.9 % — ABNORMAL LOW (ref 36.0–46.0)
Hemoglobin: 7.7 g/dL — ABNORMAL LOW (ref 12.0–15.0)
Immature Granulocytes: 0 %
Lymphocytes Relative: 26 %
Lymphs Abs: 1.7 10*3/uL (ref 0.7–4.0)
MCH: 22.2 pg — ABNORMAL LOW (ref 26.0–34.0)
MCHC: 29.7 g/dL — ABNORMAL LOW (ref 30.0–36.0)
MCV: 74.6 fL — ABNORMAL LOW (ref 80.0–100.0)
Monocytes Absolute: 0.7 10*3/uL (ref 0.1–1.0)
Monocytes Relative: 10 %
Neutro Abs: 4.1 10*3/uL (ref 1.7–7.7)
Neutrophils Relative %: 60 %
Platelets: 401 10*3/uL — ABNORMAL HIGH (ref 150–400)
RBC: 3.47 MIL/uL — ABNORMAL LOW (ref 3.87–5.11)
RDW: 20.8 % — ABNORMAL HIGH (ref 11.5–15.5)
WBC: 6.8 10*3/uL (ref 4.0–10.5)
nRBC: 0 % (ref 0.0–0.2)

## 2023-02-13 LAB — GLUCOSE, CAPILLARY
Glucose-Capillary: 119 mg/dL — ABNORMAL HIGH (ref 70–99)
Glucose-Capillary: 115 mg/dL — ABNORMAL HIGH (ref 70–99)
Glucose-Capillary: 123 mg/dL — ABNORMAL HIGH (ref 70–99)
Glucose-Capillary: 117 mg/dL — ABNORMAL HIGH (ref 70–99)

## 2023-02-13 MED ORDER — HYDROCERIN EX CREA
TOPICAL_CREAM | Freq: Two times a day (BID) | CUTANEOUS | Status: DC
  Administered 2023-02-16 – 2023-02-17 (×2): 1 via TOPICAL
  Filled 2023-02-13 (×2): qty 113

## 2023-02-13 NOTE — Progress Notes (Signed)
 Occupational Therapy Session Note  Patient Details  Name: Jennifer [name] MRN: [id number] Date of Birth: [birth year]  Today's Date: 02/13/2023 OT Individual Time: 1400-1500 OT Individual Time Calculation (min): 60 min  and Today's Date: 02/13/2023 OT Missed Time: 15 Minutes Missed Time Reason: Patient fatigue;Pain   Short Term Goals: Week 1:  OT Short Term Goal 1 (Week 1): STG = LTG due to ELOS  Skilled Therapeutic Interventions/Progress Updates:  Skilled OT intervention completed with focus on ADL retraining, functional endurance, and mobility within a shower context. Pt received upright in bed, agreeable to session. 10/10 pain reported in Rt residual limb; pre-medicated. OT offered rest breaks, repositioning and moist heat via shower for pain relief.  Pt completed all sit > stands and stand pivot transfers with bed rail/grab bar with CGA/min A with min cues needed for body positioning and safety.  Increased time needed for transitions due to pain/fatigue. Transitioned to EOB with supervision, Stand pivot > w/c then self-propelled in w/c > BSC over toilet. Stand pivot > BSC and CGA for balance to lower LB clothing. Continent of urinary void and BM. Stand pivot > w/c, > BSC in shower.    Waterproof cover applied to Rt residual limb prior to shower. Pt was able to bathe all parts with intermittent supervision, at the seated level only, using hole of commode for buttocks. Stand pivot > w/c. Able to donn shirt/deo with set up A. Threaded LB clothing with supervision, then min A for over hips using grab bar for balance but overall CGA for dynamic standing balance.  Stand pivot from w/c > EOB. Increased time needed for bed mobility. Hanger delivered 9 AKA grey shrinker. Pt doffed ACE/guaze wrap with supervision; noted small amount of serosanguineous drainage present on guaze therefore OT applied non-adherent dressing to wound prior to shrinker. OT provided education on donning process as the  shrinker has an anchor strap that her BKA one never had. Pt required increased time for tolerance due to pain, however with donning tube, pt assisted with donning over Rt residual limb with mod A, then rolled R<>L (less tolerance > Lt due to pain) for securing the waist strap. Pt tolerated both layers of shrinker however voicing increased pain and fatigue requesting to rest. Pt missed 15 mins of OT intervention secondary to fatigue/pain; OT will make up missed time as able.   OT retrieved coffee per pt request and nurse notified of pain med request. Pt remained semi upright in bed, with bed alarm on/activated, and with all needs in reach at end of session.   Therapy Documentation Precautions:  Restrictions Weight Bearing Restrictions Per Provider Order: Yes RLE Weight Bearing Per Provider Order: Non weight bearing   Therapy/Group: Individual Therapy  Lorrayne FORBES Fritter, MS, OTR/L  02/13/2023, 3:13 PM

## 2023-02-13 NOTE — Progress Notes (Signed)
 PROGRESS NOTE   Subjective/Complaints: C/o dry skin,Eucerin ordered C/o minimal pain to residual limb, 4X shrinkers ordered Discussed good blood sugar control  ROS: +minimal pain to residual limb   Objective:   No results found. Recent Labs    02/11/23 0628 02/12/23 0641  WBC 9.0 8.6  HGB 7.2* 8.2*  HCT 24.7* 27.4*  PLT 451* 454*   Recent Labs    02/11/23 0628  NA 134*  K 4.3  CL 99  CO2 25  GLUCOSE 154*  BUN 24*  CREATININE 1.41*  CALCIUM  9.3    Intake/Output Summary (Last 24 hours) at 02/13/2023 1008 Last data filed at 02/13/2023 0447 Gross per 24 hour  Intake 360 ml  Output 800 ml  Net -440 ml        Physical Exam: Vital Signs Blood pressure 125/78, pulse 82, temperature 98.5 F (36.9 C), temperature source Oral, resp. rate 16, height 5' 6 (1.676 m), weight 77.8 kg, SpO2 94%. Gen: no distress, normal appearing HEENT: oral mucosa pink and moist, NCAT Cardio: Reg rate Chest: normal effort, normal rate of breathing Abd: soft, non-distended Ext: no edema Psych: pleasant, normal affect Skin: intact Neuro: AAOx4. No apparent cognitive deficits + Phantom sensations in RLE Sensation intact. Strength 0/5 RLE, otherwise 5/5. No apparent cognitive deficits. CN 2-12 intact. Reflexes 2+.  Psych: Flat but appropriate.    Musculoskeletal:      + R-AKA incisions C/D/I with staples and sutures in place.  AROM RLE HF limited due to pain.    Skin: Skin is warm and dry. Small amount of serousanguinous drainage form surgical site.     Assessment/Plan: 1. Functional deficits which require 3+ hours per day of interdisciplinary therapy in a comprehensive inpatient rehab setting. Physiatrist is providing close team supervision and 24 hour management of active medical problems listed below. Physiatrist and rehab team continue to assess barriers to discharge/monitor patient progress toward functional and medical  goals  Care Tool:  Bathing    Body parts bathed by patient: Right arm, Left arm, Chest, Abdomen, Front perineal area, Buttocks, Right upper leg, Left upper leg, Face   Body parts bathed by helper: Left lower leg Body parts n/a: Right lower leg   Bathing assist Assist Level: Minimal Assistance - Patient > 75%     Upper Body Dressing/Undressing Upper body dressing   What is the patient wearing?: Pull over shirt    Upper body assist Assist Level: Set up assist    Lower Body Dressing/Undressing Lower body dressing      What is the patient wearing?: Pants, Underwear/pull up, Ace wrap/stump shrinker     Lower body assist Assist for lower body dressing: Maximal Assistance - Patient 25 - 49%     Toileting Toileting    Toileting assist Assist for toileting: Moderate Assistance - Patient 50 - 74%     Transfers Chair/bed transfer  Transfers assist           Locomotion Ambulation   Ambulation assist              Walk 10 feet activity   Assist           Walk 50  feet activity   Assist           Walk 150 feet activity   Assist           Walk 10 feet on uneven surface  activity   Assist           Wheelchair     Assist               Wheelchair 50 feet with 2 turns activity    Assist            Wheelchair 150 feet activity     Assist          Blood pressure 125/78, pulse 82, temperature 98.5 F (36.9 C), temperature source Oral, resp. rate 16, height 5' 6 (1.676 m), weight 77.8 kg, SpO2 94%.  Medical Problem List and Plan: 1. 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  Antithrombotics: -DVT/anticoagulation:  Pharmaceutical: Eliquis              -antiplatelet therapy: N/A 3. Pain Management:  Continue MS contin  BID--change IV dilaudid  to po  prn             --Gabapentin  TID for neuropathy and encourage desensitization             --Flexeril  prn for muscle spasms.              - Discussed wean down on pain medication with patient prior to IRF admission; she endorses 5/10 pain if asking for PRNs   4. Mood/Behavior/Sleep: LCSW to follow for evaluation and support             --has trazodone  for insomnia but having difficulty sleeping due to adjustment issues.              -antipsychotic agents: N/A   5. Neuropsych/cognition: This patient is capable of making decisions on her own behalf.   6. Skin/Wound Care: Monitor wound for healing. Add vitamin C  to promote healing.               - Encourage wrapping/compression for edema    7. Fluids/Electrolytes/Nutrition: Monitor I/O. Encourage fluid intake   8. T1DM: Hgb A1C-9.2. Was on 70/30 45 units TID AC prior to admission             --continue Insulin  glargine w/SSI for now. May need to transition back to home regimen due to insurance  Provided list of foods for good diabetes control      Recent Labs    02/12/23 0838 02/12/23 1632 02/12/23 2050  GLUCAP 145* 112* 117*     9. A fib: Monitor HR TID--continue Cardizem  and Eliquis .  10. OSA: Encourage CPAP use.  11. Hyponatremia: Likely related to poor intake which is up to 60% in past 24 hrs.              --recheck in am.  12. Acute on chronic renal failure?: BUN/SCr 14/1.33 at admission-->BUN/SCr 24/1.4             --encourage fluid intake.  13. Iron  deficiency/Anemia of chronic disease: Baseline Hgb is around 8 due to hx of menorrhagia/Fibroids               --  continue Iron  supplement 14. Depression/Neuropathy: Discussed and open to starting Cymbalta -->added  15. Overweight: provided dietary education.     LOS: 1 days A FACE TO FACE EVALUATION WAS PERFORMED  Neal Oshea P Janisse Ghan 02/13/2023, 10:08 AM

## 2023-02-13 NOTE — Progress Notes (Addendum)
 Inpatient Rehabilitation Admission Medication Review by a Pharmacist  A complete drug regimen review was completed for this patient to identify any potential clinically significant medication issues.  High Risk Drug Classes Is patient taking? Indication by Medication  Antipsychotic Yes, as an intravenous medication PRN Prochlorperazine  (PO, PR or IV) - nausea  Anticoagulant Yes Apixaban  - atrial fibrillation  Antibiotic No   Opioid Yes Morphine  CR q12h - pain PRN Hydromorphone  - severe pain PRN Tramadol  - moderate pain  Antiplatelet No   Hypoglycemics/insulin  Yes Insulin  aspart, glargine - DM Type 2  Vasoactive Medication Yes Diltiazem  - atrial fibrillation  Chemotherapy No   Other Yes Duloxetine  - depression, neuropathic pain Gabapentin  - neuropathic pain Iron  polysaccharide, mag gluconate, vitamin C  - supplements Miralax , senna - laxatives Pantoprazole  - GERD Trazodone  - sleep  PRNs: Acetaminophen  - mild pain Maalox - indigestion Cyclobenzaprine  - muscle spasms Diphenhydramine  - itching Guaifenesin /dextromethorphan - cough Melatonin - sleep Bisacodyl , fleets enema - constipation     Type of Medication Issue Identified Description of Issue Recommendation(s)  Drug Interaction(s) (clinically significant)     Duplicate Therapy     Allergy     No Medication Administration End Date     Incorrect Dose     Additional Drug Therapy Needed     Significant med changes from prior encounter (inform family/care partners about these prior to discharge). All meds, except Gabapentin , are new since 01/15/23 initial admission. Duloxetine  added 02/13/23 on CIR Communicate changes with patient/family prior to discharge.  Other  PTA meds: patient was not taking atorvastatin , diltiazem , hydralazine , lisinopril , metformin  thus these were removed from PTA meds list on 01/28/23.  Diltiazem  resumed 01/21/23.     Clinically significant medication issues were identified that warrant physician  communication and completion of prescribed/recommended actions by midnight of the next day:  No   Pharmacist comments:  - prior CIR admit 01/23/23>>02/05/23. Back to inpatient on 02/05/23 for R AKA.  Time spent performing this drug regimen review (minutes):  20  Genaro Zebedee Calin, [place] 02/13/2023 12:16 PM

## 2023-02-13 NOTE — Progress Notes (Signed)
 Inpatient Rehabilitation  Patient information reviewed and entered into eRehab system by Burnard Mealing, OTR/L, Rehab Quality Coordinator.   Information including medical coding, functional ability and quality indicators will be reviewed and updated through discharge.

## 2023-02-13 NOTE — Evaluation (Signed)
 Occupational Therapy Assessment and Plan  Patient Details  Name: Jennifer [name] MRN: [id number] Date of Birth: 06-26-1980  OT Diagnosis: acute pain, muscle weakness (generalized), and swelling of limb Rehab Potential: Rehab Potential (ACUTE ONLY): Good ELOS: 7-10 days   Today's Date: 02/13/2023 OT Individual Time: 9149-9054 OT Individual Time Calculation (min): 55 min     Hospital Problem: Principal Problem:   Above knee amputation of right lower extremity (HCC)   Past Medical History:  Past Medical History:  Diagnosis Date   Abnormal uterine bleeding    Due to fibriods/required transfusions   Acute blood loss anemia 10/13/2021   Acute kidney injury superimposed on chronic kidney disease (HCC) 10/13/2021   Acute lower limb ischemia 01/15/2023   Atrial fibrillation with RVR (HCC) 05/2022   underwent cardioversion by Jennifer [name]   Closed fracture of coronoid process of right ulna 2011   Diabetes mellitus without complication (HCC)    Diabetic ketoacidosis associated with type 1 diabetes mellitus (HCC) 10/06/2015   Headache    Hyperglycemia without ketosis 10/21/2015   Hypertension    Hypertensive urgency 10/13/2021   Menorrhagia    OSA (obstructive sleep apnea)    Serum potassium elevated 02/05/2023   Past Surgical History:  Past Surgical History:  Procedure Laterality Date   AMPUTATION Right 01/17/2023   Procedure: AMPUTATION BELOW KNEE;  Surgeon: Jennifer Norleen CROME, MD;  Location: [facility] ORS;  Service: Vascular;  Laterality: Right;   AMPUTATION Right 02/05/2023   Procedure: AMPUTATION ABOVE KNEE;  Surgeon: Jennifer Debby SAILOR, MD;  Location: [facility] OR;  Service: Vascular;  Laterality: Right;   CESAREAN SECTION     x2   LOWER EXTREMITY ANGIOGRAPHY Right 01/15/2023   Procedure: Lower Extremity Angiography;  Surgeon: Jennifer Selinda RAMAN, MD;  Location: [facility] INVASIVE CV LAB;  Service: Cardiovascular;  Laterality: Right;   none      Assessment & Plan Clinical Impression: Jennifer [name] is a  43 year old female with history of T1DM, CKD, OSA, HTN, fibroids w/hx of menorrhagia, A flutter, anemia who was originally admitted to [facility] 01/15/23 with ischemic RLE with failure of attempts at limb salvage with severe pain and underwent R-BKA 12/12. Post op continued to have issues with pain control, acute on chronic anemia and CIR was recommended due to functional decline. She was admitted to [facility] inpatient rehab on 01/23/23 for intensive rehab program. She started developing progressive pain with ischemia of flab as well as large blisters. VVS was consulted on 12/16 and patient transitioned to IV heparin  and monitored for evolution per patient request. She continued to have severe pain and elected to undergo R-AKA on 02/05/23. Post op pain managed with addition of IV dilaudid  in addition to MS contin . Acute on chronic anemia treated with one unit PRBC on 01/04 for drop in Hgb to 7.4. H/H has been monitored and as stable; eliquis  held till 01/07 with drop in Hgb to 7.2 yesterday but monitored and has rebounded to 8.2 today. due to anemia. Therapy has been working with patient who requires min to CGA and is limited by anemia, weakness, balance deficits and limb loss. She was independent PTA and CIR recommended due to functional decline.   Patient transferred to CIR on 02/12/2023 .    Patient currently requires mod with basic self-care skills and IADL secondary to muscle weakness, decreased cardiorespiratoy endurance, decreased coordination, and decreased standing balance.  Prior to hospitalization, patient could complete all self-care with mod I.  Patient will benefit from skilled  intervention to increase independence with basic self-care skills and increase level of independence with iADL prior to discharge home with care partner.  Anticipate patient will require follow up home health.  OT - End of Session Activity Tolerance: Tolerates < 10 min activity, no significant change in vital signs Endurance  Deficit: Yes Endurance Deficit Description: limited by pain and extended time needed between transitions for fatigue OT Assessment Rehab Potential (ACUTE ONLY): Good OT Barriers to Discharge: Weight bearing restrictions;Wound Care;Lack of/limited family support;Home environment access/layout OT Patient demonstrates impairments in the following area(s): Balance;Edema;Endurance;Motor;Pain;Skin Integrity OT Basic ADL's Functional Problem(s): Bathing;Dressing;Toileting OT Advanced ADL's Functional Problem(s): Simple Meal Preparation OT Transfers Functional Problem(s): Toilet;Tub/Shower OT Additional Impairment(s): None OT Plan OT Intensity: Minimum of 1-2 x/day, 45 to 90 minutes OT Frequency: 5 out of 7 days OT Duration/Estimated Length of Stay: 7-10 days OT Treatment/Interventions: Balance/vestibular training;Discharge planning;Pain management;Self Care/advanced ADL retraining;Therapeutic Activities;UE/LE Coordination activities;Disease mangement/prevention;Functional mobility training;Skin care/wound managment;Patient/family education;Therapeutic Exercise;DME/adaptive equipment instruction;Neuromuscular re-education;Psychosocial support;UE/LE Strength taining/ROM;Wheelchair propulsion/positioning OT Self Feeding Anticipated Outcome(s): Independent OT Basic Self-Care Anticipated Outcome(s): Mod I OT Toileting Anticipated Outcome(s): Mod I OT Bathroom Transfers Anticipated Outcome(s): Mod I OT Recommendation Recommendations for Other Services: Neuropsych consult;Therapeutic Recreation consult Therapeutic Recreation Interventions: Stress management Patient destination: Home Follow Up Recommendations: Home health OT Equipment Recommended: To be determined   OT Evaluation Precautions/Restrictions  Precautions Precautions: Fall Restrictions Weight Bearing Restrictions Per Provider Order: Yes RLE Weight Bearing Per Provider Order: Non weight bearing Home Living/Prior Functioning Home  Living Family/patient expects to be discharged to:: Private residence Living Arrangements: Children, Spouse/significant other Available Help at Discharge: Family, Available PRN/intermittently Type of Home: House Home Access: Ramped entrance Home Layout: One level Bathroom Shower/Tub: Tub/shower unit, Development Worker, Community Accessibility: Yes Additional Comments: No DME available  Lives With: Spouse, Other (Comment) (18 and 55 y/o) IADL History Homemaking Responsibilities: Yes Meal Prep Responsibility: Primary Laundry Responsibility: Primary Current License: Yes Occupation: On disability, Unemployed Type of Occupation: 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, government but unemployed since, press photographer for disability Leisure and Hobbies: singing, dancing, cooking Prior Function Level of Independence: Independent with basic ADLs, Independent with gait, Independent with homemaking with ambulation, Independent with transfers  Able to Take Stairs?: Yes Driving: Yes Vocation: Full time employment Glass Blower/designer Baseline Vision/History: 1 Wears glasses Ability to See in Adequate Light: 0 Adequate Patient Visual Report: No change from baseline Vision Assessment?: No apparent visual deficits Perception  Perception: Within Functional Limits Praxis Praxis: WFL Cognition Cognition Overall Cognitive Status: Within Functional Limits for tasks assessed Arousal/Alertness: Awake/alert Orientation Level: Person;Place;Situation Person: Oriented Place: Oriented Situation: Oriented Memory: Appears intact Awareness: Appears intact Problem Solving: Appears intact Safety/Judgment: Appears intact Brief Interview for Mental Status (BIMS) Repetition of Three Words (First Attempt): 3 Temporal Orientation: Year: Correct Temporal Orientation: Month: Accurate within 5 days Temporal Orientation: Day: Correct Recall: Sock: Yes, no cue required Recall: Blue: Yes, no  cue required Recall: Bed: Yes, no cue required BIMS Summary Score: 15 Sensation Sensation Light Touch: Impaired Detail Light Touch Impaired Details: Impaired RLE Hot/Cold: Not tested Proprioception: Impaired by gross assessment Stereognosis: Not tested Additional Comments: pt reports decreased sensation along R residual limb and phantom pain radiating from medial thigh to medial calf Coordination Gross Motor Movements are Fluid and Coordinated: No Coordination and Movement Description: altered balance strategies due to R AKA Heel Shin Test: unable to perform due to R AKA Motor  Motor Motor: Abnormal postural alignment and control Motor -  Skilled Clinical Observations: altered balance strategies due to R AKA  Trunk/Postural Assessment  Cervical Assessment Cervical Assessment: Within Functional Limits Thoracic Assessment Thoracic Assessment: Within Functional Limits Lumbar Assessment Lumbar Assessment: Within Functional Limits Postural Control Postural Control: Deficits on evaluation Righting Reactions: delayed on R due to AKA Protective Responses: delayed on R due to AKA  Balance Balance Balance Assessed: Yes Static Sitting Balance Static Sitting - Balance Support: Feet supported;Bilateral upper extremity supported Static Sitting - Level of Assistance: 6: Modified independent (Device/Increase time) Dynamic Sitting Balance Dynamic Sitting - Balance Support: Feet supported;No upper extremity supported Dynamic Sitting - Level of Assistance: 5: Stand by assistance (supervision) Static Standing Balance Static Standing - Balance Support: Bilateral upper extremity supported;During functional activity (RW) Static Standing - Level of Assistance: 5: Stand by assistance (CGA) Dynamic Standing Balance Dynamic Standing - Balance Support: Bilateral upper extremity supported;During functional activity (RW) Dynamic Standing - Level of Assistance: 4: Min assist Dynamic Standing -  Comments: with transfers and gait Extremity/Trunk Assessment RUE Assessment RUE Assessment: Within Functional Limits LUE Assessment LUE Assessment: Within Functional Limits  Care Tool Care Tool Self Care Eating   Eating Assist Level: Independent    Oral Care    Oral Care Assist Level: Set up assist    Bathing   Body parts bathed by patient: Right arm;Left arm;Chest;Abdomen;Front perineal area;Buttocks;Right upper leg;Left upper leg;Face Body parts bathed by helper: Left lower leg Body parts n/a: Right lower leg Assist Level: Minimal Assistance - Patient > 75%    Upper Body Dressing(including orthotics)   What is the patient wearing?: Pull over shirt   Assist Level: Set up assist    Lower Body Dressing (excluding footwear)   What is the patient wearing?: Pants;Underwear/pull up;Ace wrap/stump shrinker Assist for lower body dressing: Maximal Assistance - Patient 25 - 49%    Putting on/Taking off footwear   What is the patient wearing?: Non-skid slipper socks Assist for footwear: Total Assistance - Patient < 25%       Care Tool Toileting Toileting activity   Assist for toileting: Moderate Assistance - Patient 50 - 74%     Care Tool Bed Mobility Roll left and right activity        Sit to lying activity        Lying to sitting on side of bed activity         Care Tool Transfers Sit to stand transfer   Sit to stand assist level: Moderate Assistance - Patient 50 - 74%    Chair/bed transfer         Toilet transfer   Assist Level: Minimal Assistance - Patient > 75%     Care Tool Cognition  Expression of Ideas and Wants Expression of Ideas and Wants: 4. Without difficulty (complex and basic) - expresses complex messages without difficulty and with speech that is clear and easy to understand  Understanding Verbal and Non-Verbal Content Understanding Verbal and Non-Verbal Content: 4. Understands (complex and basic) - clear comprehension without cues or  repetitions   Memory/Recall Ability Memory/Recall Ability : Current season;That he or she is in a hospital/hospital unit   Refer to Care Plan for Long Term Goals  SHORT TERM GOAL WEEK 1 OT Short Term Goal 1 (Week 1): STG = LTG due to ELOS  Recommendations for other services: Neuropsych and Therapeutic Recreation  Stress management   Skilled Therapeutic Intervention Patient received upright in bed upon therapy arrival and agreeable to participate in OT evaluation. Education provided on  OT purpose, therapy schedule, goals for therapy, and safety policy while in rehab. 10/10 pain reported in Rt residual limb; nurse issued meds at start of session. OT offered rest breaks, repositioning and distraction for pain reduction.  Patient demonstrates dynamic standing, general functional endurance, gross motor coordination deficits as well as high pain levels resulting in difficulty completing BADL tasks without increased physical assist. Cues needed throughout for sequencing, problem solving and technique. Pt will benefit from skilled OT services to focus on mentioned deficits. Pt required extensive time for all transitions due to pain, with most difficulty off loading Rt residual limb, with pt needing BUE to assist, demonstrating limb weakness. Pt noted to have ACE improperly wrapped with BKA (not AKA) shrinker of limb. We doffed shrinker, and pt opted to wait for wound care when pain level was lower and after plan for afternoon shower, with MD notified of request for AKA shrinker delivery. Redonned 4X shrinker with donning tube with mod A. See below for ADL and functional transfer performance. ADLs completed at EOB and on regular toilet. Pt remained seated in w/c at conclusion of session with chair alarm on and all needs met at end of session.    ADL ADL Eating: Independent Where Assessed-Eating: Edge of bed Grooming: Setup Where Assessed-Grooming: Sitting at sink Upper Body Bathing: Supervision/safety  (simulated due to deferring shower to later session) Where Assessed-Upper Body Bathing: Edge of bed Lower Body Bathing: Moderate assistance (simulated) Where Assessed-Lower Body Bathing: Edge of bed Upper Body Dressing: Setup Where Assessed-Upper Body Dressing: Edge of bed Lower Body Dressing: Moderate assistance Where Assessed-Lower Body Dressing: Edge of bed;Bed level Toileting: Moderate assistance Where Assessed-Toileting: Teacher, Adult Education: Curator Method: Surveyor, Minerals: Raised toilet seat;Grab bars Tub/Shower Transfer: Unable to assess Tub/Shower Transfer Method: Unable to assess Film/video Editor: Unable to assess Film/video Editor Method: Unable to assess Mobility  Transfers Sit to Stand: Minimal Assistance - Patient > 75% Stand to Sit: Contact Guard/Touching assist   Discharge Criteria: Patient will be discharged from OT if patient refuses treatment 3 consecutive times without medical reason, if treatment goals not met, if there is a change in medical status, if patient makes no progress towards goals or if patient is discharged from hospital.  The above assessment, treatment plan, treatment alternatives and goals were discussed and mutually agreed upon: by patient  Lorrayne FORBES Fritter, MS, OTR/L  02/13/2023, 12:20 PM

## 2023-02-13 NOTE — Evaluation (Signed)
 Physical Therapy Assessment and Plan  Patient Details  Name: Jennifer [name] MRN: [id number] Date of Birth: 05-16-1980  PT Diagnosis: Abnormal posture, Abnormality of gait, Coordination disorder, Difficulty walking, Edema, Impaired sensation, Muscle weakness, and Pain in R residual limb  Rehab Potential: Good ELOS: 7 days   Today's Date: 02/13/2023 PT Individual Time: 1100-1156 PT Individual Time Calculation (min): 56 min    Hospital Problem: Principal Problem:   Above knee amputation of right lower extremity (HCC)   Past Medical History:  Past Medical History:  Diagnosis Date   Abnormal uterine bleeding    Due to fibriods/required transfusions   Acute blood loss anemia 10/13/2021   Acute kidney injury superimposed on chronic kidney disease (HCC) 10/13/2021   Acute lower limb ischemia 01/15/2023   Atrial fibrillation with RVR (HCC) 05/2022   underwent cardioversion by Dr. Perla   Closed fracture of coronoid process of right ulna 2011   Diabetes mellitus without complication (HCC)    Diabetic ketoacidosis associated with type 1 diabetes mellitus (HCC) 10/06/2015   Headache    Hyperglycemia without ketosis 10/21/2015   Hypertension    Hypertensive urgency 10/13/2021   Menorrhagia    OSA (obstructive sleep apnea)    Serum potassium elevated 02/05/2023   Past Surgical History:  Past Surgical History:  Procedure Laterality Date   AMPUTATION Right 01/17/2023   Procedure: AMPUTATION BELOW KNEE;  Surgeon: Elnor Norleen CROME, MD;  Location: [facility] ORS;  Service: Vascular;  Laterality: Right;   AMPUTATION Right 02/05/2023   Procedure: AMPUTATION ABOVE KNEE;  Surgeon: Magda Debby SAILOR, MD;  Location: [facility] OR;  Service: Vascular;  Laterality: Right;   CESAREAN SECTION     x2   LOWER EXTREMITY ANGIOGRAPHY Right 01/15/2023   Procedure: Lower Extremity Angiography;  Surgeon: Marea Selinda RAMAN, MD;  Location: [facility] INVASIVE CV LAB;  Service: Cardiovascular;  Laterality: Right;   none       Assessment & Plan Clinical Impression: Patient is a 43 y.o. year old female with history of T1DM, CKD, OSA, HTN, fibroids w/hx of menorrhagia, A flutter, anemia who was originally admitted to [facility] 01/15/23 with ischemic RLE with failure of attempts at limb salvage with severe pain and underwent R-BKA 12/12. Post op continued to have issues with pain control, acute on chronic anemia and CIR was recommended due to functional decline. She was admitted to [facility] inpatient rehab on 01/23/23 for intensive rehab program. She started developing progressive pain with ischemia of flab as well as large blisters. VVS was consulted on 12/16 and patient transitioned to IV heparin  and monitored for evolution per patient request. She continued to have severe pain and elected to undergo R-AKA on 02/05/23. Post op pain managed with addition of IV dilaudid  in addition to MS contin . Acute on chronic anemia treated with one unit PRBC on 01/04 for drop in Hgb to 7.4. H/H has been monitored and as stable; eliquis  held till 01/07 with drop in Hgb to 7.2 yesterday but monitored and has rebounded to 8.2 today. due to anemia. Therapy has been working with patient who requires min to CGA and is limited by anemia, weakness, balance deficits and limb loss. She was independent PTA and CIR recommended due to functional decline.   Patient currently requires min with mobility secondary to muscle weakness, decreased cardiorespiratoy endurance, and decreased standing balance, decreased postural control, decreased balance strategies, and difficulty maintaining precautions.  Prior to hospitalization, patient was independent  with mobility and lived with Spouse, Other (Comment) (18 and  70 y/o) in a House home.  Home access is  Ramped entrance.  Patient will benefit from skilled PT intervention to maximize safe functional mobility, minimize fall risk, and decrease caregiver burden for planned discharge home with intermittent assist.  Anticipate  patient will benefit from follow up OP at discharge.  PT - End of Session Activity Tolerance: Tolerates 30+ min activity with multiple rests Endurance Deficit: Yes Endurance Deficit Description: limited by pain and extended time needed between transitions for fatigue PT Assessment Rehab Potential (ACUTE/IP ONLY): Good PT Barriers to Discharge: Inaccessible home environment;Decreased caregiver support;Home environment access/layout;Wound Care;Weight bearing restrictions;Other (comments) PT Patient demonstrates impairments in the following area(s): Balance;Edema;Endurance;Motor;Pain;Sensory;Skin Integrity PT Transfers Functional Problem(s): Bed Mobility;Bed to Chair;Car;Furniture PT Locomotion Functional Problem(s): Ambulation;Wheelchair Mobility;Stairs PT Plan PT Intensity: Minimum of 1-2 x/day ,45 to 90 minutes PT Frequency: 5 out of 7 days PT Duration Estimated Length of Stay: 7 days PT Treatment/Interventions: Ambulation/gait training;Discharge planning;Functional mobility training;Psychosocial support;Therapeutic Activities;Balance/vestibular training;Disease management/prevention;Neuromuscular re-education;Skin care/wound management;Therapeutic Exercise;Wheelchair propulsion/positioning;DME/adaptive equipment instruction;Pain management;Splinting/orthotics;UE/LE Strength taining/ROM;Community reintegration;Functional electrical stimulation;Patient/family education;Stair training;UE/LE Coordination activities PT Transfers Anticipated Outcome(s): Mod I with LRAD PT Locomotion Anticipated Outcome(s): Mod I with LRAD PT Recommendation Follow Up Recommendations: Outpatient PT Patient destination: Home Equipment Recommended: To be determined Equipment Details: has none   PT Evaluation Precautions/Restrictions Precautions Precautions: Fall Restrictions Weight Bearing Restrictions Per Provider Order: Yes RLE Weight Bearing Per Provider Order: Non weight bearing Pain Interference Pain  Interference Pain Effect on Sleep: 1. Rarely or not at all Pain Interference with Therapy Activities: 1. Rarely or not at all Pain Interference with Day-to-Day Activities: 1. Rarely or not at all Home Living/Prior Functioning Home Living Available Help at Discharge: Family;Available PRN/intermittently Type of Home: House Home Access: Ramped entrance Home Layout: One level Bathroom Shower/Tub: Tub/shower unit;Curtain Firefighter: Standard Bathroom Accessibility: Yes Additional Comments: No DME available  Lives With: Spouse;Other (Comment) (18 and 38 y/o) Prior Function Level of Independence: Independent with basic ADLs;Independent with gait;Independent with homemaking with ambulation;Independent with transfers  Able to Take Stairs?: Yes Driving: Yes Vocation: Full time employment Vocation Requirements: social research officer, government Vision/Perception  Vision - History Ability to See in Adequate Light: 0 Adequate Perception Perception: Within Functional Limits Praxis Praxis: WFL  Cognition Overall Cognitive Status: Within Functional Limits for tasks assessed Arousal/Alertness: Awake/alert Orientation Level: Oriented X4 Memory: Appears intact Awareness: Appears intact Problem Solving: Appears intact Safety/Judgment: Appears intact Sensation Sensation Light Touch: Impaired Detail Light Touch Impaired Details: Impaired RLE Hot/Cold: Not tested Proprioception: Impaired by gross assessment Stereognosis: Not tested Additional Comments: pt reports decreased sensation along R residual limb and phantom pain radiating from medial thigh to medial calf Coordination Gross Motor Movements are Fluid and Coordinated: No Coordination and Movement Description: altered balance strategies due to R AKA Heel Shin Test: unable to perform due to R AKA Motor  Motor Motor: Abnormal postural alignment and control Motor - Skilled Clinical Observations: altered balance strategies due to R AKA   Trunk/Postural Assessment  Cervical Assessment Cervical Assessment: Within Functional Limits Thoracic Assessment Thoracic Assessment: Within Functional Limits Lumbar Assessment Lumbar Assessment: Within Functional Limits Postural Control Postural Control: Deficits on evaluation Righting Reactions: delayed on R due to AKA Protective Responses: delayed on R due to AKA  Balance Balance Balance Assessed: Yes Static Sitting Balance Static Sitting - Balance Support: Feet supported;Bilateral upper extremity supported Static Sitting - Level of Assistance: 6: Modified independent (Device/Increase time) Dynamic Sitting Balance Dynamic Sitting - Balance Support: Feet supported;No upper extremity supported Dynamic  Sitting - Level of Assistance: 5: Stand by assistance (supervision) Static Standing Balance Static Standing - Balance Support: Bilateral upper extremity supported;During functional activity (RW) Static Standing - Level of Assistance: 5: Stand by assistance (CGA) Dynamic Standing Balance Dynamic Standing - Balance Support: Bilateral upper extremity supported;During functional activity (RW) Dynamic Standing - Level of Assistance: 4: Min assist Dynamic Standing - Comments: with transfers and gait Extremity Assessment  RLE Assessment RLE Assessment: Exceptions to [facility] General Strength Comments: tested sitting in WC RLE Strength Right Hip Flexion: 2-/5 Right Hip ABduction: 2-/5 Right Hip ADduction: 2-/5 LLE Assessment LLE Assessment: Exceptions to [facility] General Strength Comments: tested sitting in WC LLE Strength Left Hip Flexion: 4-/5 Left Hip ABduction: 3+/5 Left Hip ADduction: 3+/5 Left Knee Flexion: 4-/5 Left Knee Extension: 3+/5 Left Ankle Dorsiflexion: 4-/5  Care Tool Care Tool Bed Mobility Roll left and right activity        Sit to lying activity        Lying to sitting on side of bed activity         Care Tool Transfers Sit to stand transfer   Sit to stand  assist level: Minimal Assistance - Patient > 75%    Chair/bed transfer   Chair/bed transfer assist level: Contact Guard/Touching assist    Car transfer   Car transfer assist level: Contact Guard/Touching assist      Care Tool Locomotion Ambulation   Assist level: Minimal Assistance - Patient > 75% Assistive device: Walker-rolling Max distance: 43ft  Walk 10 feet activity   Assist level: Minimal Assistance - Patient > 75% Assistive device: Walker-rolling   Walk 50 feet with 2 turns activity Walk 50 feet with 2 turns activity did not occur: Safety/medical concerns (fatigue, pain)      Walk 150 feet activity Walk 150 feet activity did not occur: Safety/medical concerns (fatigue, pain)      Walk 10 feet on uneven surfaces activity Walk 10 feet on uneven surfaces activity did not occur: Safety/medical concerns (fatigue, pain)      Stairs Stair activity did not occur: Safety/medical concerns (fatigue, pain)        Walk up/down 1 step activity Walk up/down 1 step or curb (drop down) activity did not occur: Safety/medical concerns (fatigue, pain)      Walk up/down 4 steps activity Walk up/down 4 steps activity did not occur: Safety/medical concerns (fatigue, pain)      Walk up/down 12 steps activity Walk up/down 12 steps activity did not occur: Safety/medical concerns (fatigue, pain)      Pick up small objects from floor Pick up small object from the floor (from standing position) activity did not occur: Safety/medical concerns (fatigue, pain)      Wheelchair Is the patient using a wheelchair?: Yes Type of Wheelchair: Manual   Wheelchair assist level: Supervision/Verbal cueing Max wheelchair distance: 16ft  Wheel 50 feet with 2 turns activity   Assist Level: Supervision/Verbal cueing  Wheel 150 feet activity   Assist Level: Moderate Assistance - Patient 50 - 74%    Refer to Care Plan for Long Term Goals  SHORT TERM GOAL WEEK 1 PT Short Term Goal 1 (Week 1): STG=LTG  due to LOS  Recommendations for other services: None   Skilled Therapeutic Intervention Evaluation completed (see details above and below) with education on PT POC and goals and individual treatment initiated with focus on functional mobility/transfers, generalized strengthening and endurance, dynamic standing balance/coordination, ambulation, toileting, and simulated car transfers. Received pt sitting in  WC, pt educated on PT evaluation, CIR policies, and therapy schedule and agreeable. Pt reported pain 9/10 in R residual limb - RN notified and present to administer pain medication. Pt performed WC mobility 89ft using BUE and supervision to ortho gym. Pt performed simulated car transfer with RW via stand<>pivot with CGA. Pt then ambulated 79ft with RW and CGA/min A - cues to push up through BUEs to clear L foot as pt initially attempting to pivot entire way.   Pt performed WC mobility additional 7ft using BUE and supervision with emphasis on UE strength - transported remainder of way to dayroom in WC dependently. Educated pt on WC parts management (specifically legrests) and pt able to remove legrest without assist. Pt transferred on/off mat via stand<>pivot with RW and CGA and performed the following exercises with emphasis on LE/core strength: -LLE knee extensions 2x12 with 3lb ankle weight -LLE hip flexion 2x12 with 3lb ankle weight -tricep extensions on yoga blocks 2x8 -lateral trunk rotations with 4lb medicine ball 2x10 bilaterally  Transferred mat<>WC with and without RW and CGA/min A and transported back to room dependently. Pt reported urge to toilet and transferred on/off bedside commode with RW and CGA/min A. Pt required min A for clothing management but unable to void. Transferred to bed and concluded session with pt sitting EOB, needs within reach, and bed alarm on.   Mobility Transfers Transfers: Sit to Stand;Stand to Sit;Stand Pivot Transfers;Squat Pivot Transfers Sit to Stand:  Minimal Assistance - Patient > 75% Stand to Sit: Contact Guard/Touching assist Stand Pivot Transfers: Minimal Assistance - Patient > 75% Squat Pivot Transfers: Minimal Assistance - Patient > 75% Transfer (Assistive device): Rolling walker Locomotion  Gait Ambulation: Yes Gait Assistance: Minimal Assistance - Patient > 75% Gait Distance (Feet): 15 Feet Assistive device: Rolling walker Gait Assistance Details:  (hop to pattern) Gait Gait: Yes Gait Pattern: Impaired Gait Pattern:  (hop to) Gait velocity: decreased Stairs / Additional Locomotion Stairs: No Wheelchair Mobility Wheelchair Mobility: Yes Wheelchair Assistance: Doctor, General Practice: Both upper extremities Wheelchair Parts Management: Supervision/cueing Distance: 74ft   Discharge Criteria: Patient will be discharged from PT if patient refuses treatment 3 consecutive times without medical reason, if treatment goals not met, if there is a change in medical status, if patient makes no progress towards goals or if patient is discharged from hospital.  The above assessment, treatment plan, treatment alternatives and goals were discussed and mutually agreed upon: by patient  Shaye Elling M Zaunegger Ja Ohman Zaunegger PT, DPT 02/13/2023, 12:13 PM

## 2023-02-13 NOTE — Progress Notes (Signed)
 Patient alert and oriented x 4.Patient refused orthostatic vital signs this am. Patient states her leg hurts. Pain medication administered. Patient educated on risk/benefits.

## 2023-02-13 NOTE — Plan of Care (Signed)
  Problem: RH Balance Goal: LTG Patient will maintain dynamic standing with ADLs (OT) Description: LTG:  Patient will maintain dynamic standing balance with assist during activities of daily living (OT)  Flowsheets (Taken 02/13/2023 1021) LTG: Pt will maintain dynamic standing balance during ADLs with: Independent with assistive device   Problem: Sit to Stand Goal: LTG:  Patient will perform sit to stand in prep for activites of daily living with assistance level (OT) Description: LTG:  Patient will perform sit to stand in prep for activites of daily living with assistance level (OT) Flowsheets (Taken 02/13/2023 1021) LTG: PT will perform sit to stand in prep for activites of daily living with assistance level: Independent with assistive device   Problem: RH Bathing Goal: LTG Patient will bathe all body parts with assist levels (OT) Description: LTG: Patient will bathe all body parts with assist levels (OT) Flowsheets (Taken 02/13/2023 1021) LTG: Pt will perform bathing with assistance level/cueing: Independent with assistive device    Problem: RH Dressing Goal: LTG Patient will perform lower body dressing w/assist (OT) Description: LTG: Patient will perform lower body dressing with assist, with/without cues in positioning using equipment (OT) Flowsheets (Taken 02/13/2023 1021) LTG: Pt will perform lower body dressing with assistance level of: Independent with assistive device   Problem: RH Toileting Goal: LTG Patient will perform toileting task (3/3 steps) with assistance level (OT) Description: LTG: Patient will perform toileting task (3/3 steps) with assistance level (OT)  Flowsheets (Taken 02/13/2023 1021) LTG: Pt will perform toileting task (3/3 steps) with assistance level: Independent with assistive device   Problem: RH Toilet Transfers Goal: LTG Patient will perform toilet transfers w/assist (OT) Description: LTG: Patient will perform toilet transfers with assist, with/without cues  using equipment (OT) Flowsheets (Taken 02/13/2023 1021) LTG: Pt will perform toilet transfers with assistance level of: Independent with assistive device   Problem: RH Tub/Shower Transfers Goal: LTG Patient will perform tub/shower transfers w/assist (OT) Description: LTG: Patient will perform tub/shower transfers with assist, with/without cues using equipment (OT) Flowsheets (Taken 02/13/2023 1021) LTG: Pt will perform tub/shower stall transfers with assistance level of: Independent with assistive device   Problem: RH Pre-functional/Other (Specify) Goal: RH LTG OT (Specify) 1 Description: RH LTG OT (Specify) 1 Flowsheets (Taken 02/13/2023 1021) LTG: Other OT (Specify) 1: Pt will donn R AKA shrinker with mod I

## 2023-02-13 NOTE — Progress Notes (Signed)
 Orthopedic Tech Progress Note Patient Details:  [name] 01-01-1981 [id number]  Called into [facility].  Patient ID: Jennifer [name], female   DOB: 1980/11/29, 43 y.o.   MRN: [id number]  Jennifer [name] 02/13/2023, 11:03 AM

## 2023-02-13 NOTE — H&P (Signed)
 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 Post op continued to have issues with pain control, acute on chronic anemia and CIR was recommended due to functional decline. She was admitted to [facility] inpatient rehab on 01/23/23 for intensive rehab program. She started developing progressive pain with ischemia of flab as well as large blisters. VVS was consulted on 12/16 and patient transitioned to IV heparin  and monitored for evolution per patient request. She continued to have severe pain and elected to undergo R-AKA on 02/05/23. Post op pain managed with addition of IV dilaudid  in addition to MS contin . Acute on chronic anemia treated with one unit PRBC on 01/04 for drop in Hgb to 7.4. H/H has been monitored and as stable; eliquis  held till 01/07 with drop in Hgb to 7.2 yesterday but monitored and has rebounded to 8.2 today.  due to anemia. Therapy has been working with patient who requires min to CGA and is limited by anemia, weakness, balance deficits and limb loss. She was independent PTA and CIR recommended due to functional decline.      Review of Systems  Constitutional:  Negative for chills, fever and weight loss.  HENT:  Negative for hearing loss and tinnitus.   Eyes:  Negative for blurred vision.  Respiratory:  Negative for cough and shortness of breath.   Cardiovascular:  Negative for chest pain and palpitations.  Gastrointestinal:  Negative for constipation and heartburn.  Genitourinary:  Negative for dysuria and urgency.  Musculoskeletal:  Negative for joint pain and myalgias.  Skin:  Negative for rash.  Neurological:  Positive for sensory change (has sharp  shooting pain from right knee down/phantom pain).  Psychiatric/Behavioral:  Positive for depression. The patient is nervous/anxious and has insomnia.             Past Medical History:  Diagnosis Date   Abnormal uterine bleeding      Due to fibriods/required transfusions   Acute blood loss anemia 10/13/2021   Acute kidney injury superimposed on chronic kidney disease (HCC) 10/13/2021   Acute lower limb ischemia 01/15/2023   Atrial fibrillation with RVR (HCC) 05/2022    underwent cardioversion by Dr. Perla   Closed fracture of coronoid process of right ulna 2011   Diabetes mellitus without complication (HCC)     Diabetic ketoacidosis associated with type 1 diabetes mellitus (HCC) 10/06/2015   Headache     Hyperglycemia without ketosis 10/21/2015   Hypertension     Hypertensive urgency 10/13/2021   Menorrhagia     OSA (obstructive sleep apnea)     Serum potassium elevated 02/05/2023               Past Surgical History:  Procedure Laterality Date   AMPUTATION Right 01/17/2023    Procedure: AMPUTATION BELOW KNEE;  Surgeon: Elnor Norleen CROME, MD;  Location: [facility] ORS;  Service: Vascular;  Laterality: Right;   AMPUTATION Right 02/05/2023    Procedure: AMPUTATION ABOVE KNEE;  Surgeon: Magda Debby SAILOR, MD;  Location: [facility] OR;  Service: Vascular;  Laterality: Right;   CESAREAN SECTION        x2   LOWER EXTREMITY ANGIOGRAPHY Right 01/15/2023  Procedure: Lower Extremity Angiography;  Surgeon: Marea Selinda RAMAN, MD;  Location: [facility] INVASIVE CV LAB;  Service: Cardiovascular;  Laterality: Right;   none                   Family History  Adopted: Yes  Problem Relation Age of Onset   Diabetes Other     Hypertension Other            Social History:  M arried. Independent and work in till a year go. She  reports that she has been smoking about 1.5 PPD.  She has never used smokeless tobacco. She reports that she does not drink alcohol and does not use drugs.     Allergies      Allergies   Allergen Reactions   Demerol  [Meperidine ] Hives, Itching and Nausea And Vomiting   Meloxicam Nausea And Vomiting   Oxycodone  Hives, Itching and Nausea And Vomiting   Toradol  [Ketorolac  Tromethamine ] Hives, Itching and Nausea And Vomiting              Medications Prior to Admission  Medication Sig Dispense Refill   acetaminophen  (TYLENOL ) 500 MG tablet Take 2 tablets (1,000 mg total) by mouth 4 (four) times daily.       Baclofen  5 MG TABS Take 1 tablet (5 mg total) by mouth 3 (three) times daily as needed for muscle spasms.       cyclobenzaprine  (FLEXERIL ) 10 MG tablet Take 1 tablet (10 mg total) by mouth 3 (three) times daily.       DEPO-PROVERA 150 MG/ML injection Inject 150 mg into the muscle every 3 (three) months.       diltiazem  (CARDIZEM  CD) 180 MG 24 hr capsule Take 1 capsule (180 mg total) by mouth daily.       gabapentin  (NEURONTIN ) 400 MG capsule Take 1 capsule (400 mg total) by mouth 4 (four) times daily.       HYDROmorphone  (DILAUDID ) 2 MG tablet Take 1 tablet (2 mg total) by mouth every 4 (four) hours as needed for severe pain (pain score 7-10).       Insulin  Asp Prot & Asp FlexPen (NOVOLOG  70/30 MIX) (70-30) 100 UNIT/ML FlexPen Inject 45 Units into the skin with breakfast, with lunch, and with evening meal.       insulin  aspart (NOVOLOG ) 100 UNIT/ML injection Inject 0-9 Units into the skin 3 (three) times daily with meals.       insulin  glargine-yfgn (SEMGLEE ) 100 UNIT/ML injection Inject 0.31 mLs (31 Units total) into the skin 2 (two) times daily.       iron  polysaccharides (NIFEREX) 150 MG capsule Take 1 capsule (150 mg total) by mouth 2 (two) times daily before lunch and supper.       lidocaine  (LIDODERM ) 5 % Place 1 patch onto the skin daily. Remove & Discard patch within 12 hours or as directed by MD       magnesium  gluconate (MAGONATE) 500 MG tablet Take 0.5 tablets (250 mg total) by mouth at bedtime.       morphine  (MS CONTIN ) 15 MG 12 hr tablet Take 3 tablets (45 mg  total) by mouth every 12 (twelve) hours.       pantoprazole  (PROTONIX ) 40 MG tablet Take 1 tablet (40 mg total) by mouth daily.       polyethylene glycol (MIRALAX  / GLYCOLAX ) 17 g packet Take 17 g by mouth daily.       senna (SENOKOT) 8.6 MG TABS tablet Take 2  tablets (17.2 mg total) by mouth at bedtime.       traMADol  (ULTRAM ) 50 MG tablet Take 1 tablet (50 mg total) by mouth every 6 (six) hours as needed for moderate pain (pain score 4-6).       traZODone  (DESYREL ) 100 MG tablet Take 1 tablet (100 mg total) by mouth at bedtime.              Home: Home Living Family/patient expects to be discharged to:: Inpatient rehab Living Arrangements: Children, Spouse/significant other Available Help at Discharge: Family, Available PRN/intermittently Type of Home: House Home Access: Stairs to enter [employer] of Steps: 4 Entrance Stairs-Rails: Right Home Layout: One level Bathroom Shower/Tub: Tub/shower unit, Engineer, Building Services: Pharmacist, Community: Yes Home Equipment: Crutches  Lives With: Spouse, Other (Comment) (35 y/o and 19 y/o)   Functional History: Prior Function Prior Level of Function : Independent/Modified Independent Mobility Comments: Independent without AE PTA, denies falls history. Appears to have reached a Mod I level with transfers, pushing herself short distances with w/c prior to re-admission. ADLs Comments: Independent, caring for family, eager to get back to PLOF   Functional Status:  Mobility: Bed Mobility Overal bed mobility: Needs Assistance Bed Mobility: Supine to Sit Supine to sit: Supervision Sit to supine: Min assist General bed mobility comments: Pt received sitting on EOB, uses BUE to assist RLE to square up to sitting on EOB. Transfers Overall transfer level: Needs assistance Equipment used: Rolling walker (2 wheels) Transfers: Sit to/from Stand Sit to Stand: Contact guard assist, Supervision Bed to/from chair/wheelchair/BSC  transfer type:: Stand pivot Stand pivot transfers: Contact guard assist Squat pivot transfers: Contact guard assist Step pivot transfers: Min assist General transfer comment: STS from EOB, recliner with CGA fade to supervision with good ability to push to standing with 1-2 UE. Stand pivot bed>recliner with RW & CGA with pt scooting L foot along floor vs picking it up/hopping to complete transfer. Ambulation/Gait Ambulation/Gait assistance: Contact guard assist Gait Distance (Feet): 5 Feet (~2.5 ft forwards & backwards with RW) Assistive device: Rolling walker (2 wheels) Gait Pattern/deviations: Step-to pattern General Gait Details: Pt does attempt hopping forward on L foot x 1 time but notes increased R AKA pain with jarring movement. Pt instead scoots L foot along floor. Pt self limits gait distance 2/2 hoping to not become lightheaded with mobility noting this happened yesterday. Gait velocity: decreased Gait velocity interpretation: <1.31 ft/sec, indicative of household Actuary propulsion: Both upper extremities Wheelchair Assistance Details (indicate cue type and reason): max verbal cues for technique (pivots, propulsion, retro etc). max assist needed to manage WC parts (brakes, arm rest)   ADL: ADL Overall ADL's : Needs assistance/impaired Eating/Feeding: Independent, Sitting Grooming: Sitting, Set up Upper Body Bathing: Sitting, Set up Lower Body Bathing: Contact guard assist, Sit to/from stand Lower Body Bathing Details (indicate cue type and reason): simulated via LB pericare in standing Lower Body Dressing: Bed level, Moderate assistance Toilet Transfer: Minimal assistance, BSC/3in1, Stand-pivot, Rolling walker (2 wheels), Cueing for safety, Cueing for sequencing Toilet Transfer Details (indicate cue type and reason): stand pivot from EOB<>BSC wtih rw and MIN A d/t increased pain Toileting- Clothing Manipulation and Hygiene: Contact guard  assist, Sit to/from stand Toileting - Clothing Manipulation Details (indicate cue type and reason): able to complete anterior pericare after continent urine void in standing with CGA for balance and unilateral support from RW Functional mobility during ADLs: Minimal assistance, Rolling walker (2 wheels), Cueing for safety, Cueing  for sequencing (stand pivot only) General ADL Comments: ADL participation greatly limited by pain this session   Cognition: Cognition Overall Cognitive Status: Within Functional Limits for tasks assessed Orientation Level: Oriented X4 Cognition Arousal: Alert Behavior During Therapy: WFL for tasks assessed/performed Overall Cognitive Status: Within Functional Limits for tasks assessed General Comments: motivated to participate, asking about going back to CIR     Blood pressure 117/73, pulse 84, temperature 98.9 F (37.2 C), temperature source Oral, resp. rate 18, weight 80.4 kg, SpO2 99%. Physical Exam  PE: Constitution: Appropriate appearance for age. No apparent distress  Resp: No respiratory distress. No accessory muscle usage. on RA and CTAB Cardio: Well perfused appearance. + 1+ R residual limb edema Abdomen: Nondistended. Nontender.  +BS Neuro: AAOx4. No apparent cognitive deficits + Phantom sensations in RLE Sensation intact. Strength 0/5 RLE, otherwise 5/5. No apparent cognitive deficits. CN 2-12 intact. Reflexes 2+.  Psych: Flat but appropriate.   Musculoskeletal:      + R-AKA incisions C/D/I with staples and sutures in place.  AROM RLE HF limited due to pain.   Skin: Skin is warm and dry. Small amount of serousanguinous drainage form surgical site.        Lab Results Last 48 Hours        Results for orders placed or performed during the hospital encounter of 02/05/23 (from the past 48 hours)  Glucose, capillary     Status: Abnormal    Collection Time: 02/10/23  9:28 PM  Result Value Ref Range    Glucose-Capillary 191 (H) 70 - 99 mg/dL       Comment: Glucose reference range applies only to samples taken after fasting for at least 8 hours.    Comment 1 Document in Chart    Basic metabolic panel     Status: Abnormal    Collection Time: 02/11/23  6:28 AM  Result Value Ref Range    Sodium 134 (L) 135 - 145 mmol/L    Potassium 4.3 3.5 - 5.1 mmol/L    Chloride 99 98 - 111 mmol/L    CO2 25 22 - 32 mmol/L    Glucose, Bld 154 (H) 70 - 99 mg/dL      Comment: Glucose reference range applies only to samples taken after fasting for at least 8 hours.    BUN 24 (H) 6 - 20 mg/dL    Creatinine, Ser 8.58 (H) 0.44 - 1.00 mg/dL    Calcium  9.3 8.9 - 10.3 mg/dL    GFR, Estimated 47 (L) >60 mL/min      Comment: (NOTE) Calculated using the CKD-EPI Creatinine Equation (2021)      Anion gap 10 5 - 15      Comment: Performed at [facility], [address] [place], [place] [address]  CBC with Differential/Platelet     Status: Abnormal    Collection Time: 02/11/23  6:28 AM  Result Value Ref Range    WBC 9.0 4.0 - 10.5 K/uL    RBC 3.29 (L) 3.87 - 5.11 MIL/uL    Hemoglobin 7.2 (L) 12.0 - 15.0 g/dL      Comment: Reticulocyte Hemoglobin testing may be clinically indicated, consider ordering this additional test [id number]      HCT 24.7 (L) 36.0 - 46.0 %    MCV 75.1 (L) 80.0 - 100.0 fL    MCH 21.9 (L) 26.0 - 34.0 pg    MCHC 29.1 (L) 30.0 - 36.0 g/dL    RDW 78.7 (H) 88.4 -  15.5 %    Platelets 451 (H) 150 - 400 K/uL    nRBC 0.0 0.0 - 0.2 %    Neutrophils Relative % 63 %    Neutro Abs 5.6 1.7 - 7.7 K/uL    Lymphocytes Relative 23 %    Lymphs Abs 2.1 0.7 - 4.0 K/uL    Monocytes Relative 10 %    Monocytes Absolute 0.9 0.1 - 1.0 K/uL    Eosinophils Relative 4 %    Eosinophils Absolute 0.4 0.0 - 0.5 K/uL    Basophils Relative 0 %    Basophils Absolute 0.0 0.0 - 0.1 K/uL    WBC Morphology MORPHOLOGY UNREMARKABLE      RBC Morphology MORPHOLOGY UNREMARKABLE      Smear Review Normal platelet morphology      Immature Granulocytes 0 %     Abs Immature Granulocytes 0.03 0.00 - 0.07 K/uL      Comment: Performed at [facility], [address], [place], [place] [address]  Magnesium      Status: None    Collection Time: 02/11/23  6:28 AM  Result Value Ref Range    Magnesium  1.9 1.7 - 2.4 mg/dL      Comment: Performed at [facility], [address], [place], [place] [address]  Glucose, capillary     Status: Abnormal    Collection Time: 02/11/23  9:24 AM  Result Value Ref Range    Glucose-Capillary 141 (H) 70 - 99 mg/dL      Comment: Glucose reference range applies only to samples taken after fasting for at least 8 hours.  Glucose, capillary     Status: Abnormal    Collection Time: 02/11/23  2:29 PM  Result Value Ref Range    Glucose-Capillary 111 (H) 70 - 99 mg/dL      Comment: Glucose reference range applies only to samples taken after fasting for at least 8 hours.  CBC     Status: Abnormal    Collection Time: 02/12/23  6:41 AM  Result Value Ref Range    WBC 8.6 4.0 - 10.5 K/uL    RBC 3.65 (L) 3.87 - 5.11 MIL/uL    Hemoglobin 8.2 (L) 12.0 - 15.0 g/dL      Comment: Reticulocyte Hemoglobin testing may be clinically indicated, consider ordering this additional test [id number]      HCT 27.4 (L) 36.0 - 46.0 %    MCV 75.1 (L) 80.0 - 100.0 fL    MCH 22.5 (L) 26.0 - 34.0 pg    MCHC 29.9 (L) 30.0 - 36.0 g/dL    RDW 78.6 (H) 88.4 - 15.5 %    Platelets 454 (H) 150 - 400 K/uL    nRBC 0.0 0.0 - 0.2 %      Comment: Performed at [facility], [address] [place], [place] [address]  Glucose, capillary     Status: Abnormal    Collection Time: 02/12/23  8:38 AM  Result Value Ref Range    Glucose-Capillary 145 (H) 70 - 99 mg/dL      Comment: Glucose reference range applies only to samples taken after fasting for at least 8 hours.      Imaging Results (Last 48 hours)  No results found.         Blood pressure 117/73, pulse 84, temperature 98.9 F (37.2 C), temperature source Oral, resp. rate 18, weight  80.4 kg, SpO2 99%.   Medical Problem List and Plan: 1. Functional deficits secondary  to R AKA             -patient may shower with surgical site covered             -ELOS/Goals: 5-7 days, Mod I PT/OT   - Stable to admit to CIR  2.  Antithrombotics: -DVT/anticoagulation:  Pharmaceutical: Eliquis              -antiplatelet therapy: N/A 3. Pain Management:  Continue MS contin  BID--change IV dilaudid  to po prn             --Gabapentin  TID for neuropathy and encourage desensitization             --Flexeril  prn for muscle spasms.   - Discussed wean down on pain medication with patient prior to IRF admission; she endorses 5/10 pain if asking for PRNs  4. Mood/Behavior/Sleep: LCSW to follow for evaluation and support             --has trazodone  for insomnia but having difficulty sleeping due to adjustment issues.              -antipsychotic agents: N/A  5. Neuropsych/cognition: This patient is capable of making decisions on her own behalf.  6. Skin/Wound Care: Monitor wound for healing. Add vitamin C  to promote healing.    - Encourage wrapping/compression for edema   7. Fluids/Electrolytes/Nutrition: Monitor I/O. Encourage fluid intake  8. T1DM: Hgb A1C-9.2. Was on 70/30 45 units TID AC prior to admission             --continue Insulin  glargine w/SSI for now. May need to transition back to home regimen due to insurance Recent Labs    02/12/23 0838 02/12/23 1632 02/12/23 2050  GLUCAP 145* 112* 117*     9. A fib: Monitor HR TID--continue Cardizem  and Eliquis .  10. OSA: Encourage CPAP use.  11. Hyponatremia: Likely related to poor intake which is up to 60% in past 24 hrs.              --recheck in am.  12. Acute on chronic renal failure?: BUN/SCr 14/1.33 at admission-->BUN/SCr 24/1.4             --encourage fluid intake.  13. Iron  deficiency/Anemia of chronic disease: Baseline Hgb is around 8 due to hx of menorrhagia/Fibroids               --continue Iron  supplement 14.  Depression/Neuropathy: Discussed and open to starting Cymbalta -->added    Sharlet GORMAN Schmitz, PA-C 02/12/2023 I have examined the patient independently and edited the note for HPI, ROS, exam, assessment, and plan as appropriate. I am in agreement with the above recommendations.   Joesph JAYSON Likes, DO 02/13/2023

## 2023-02-14 DIAGNOSIS — K5901 Slow transit constipation: Secondary | ICD-10-CM

## 2023-02-14 DIAGNOSIS — R739 Hyperglycemia, unspecified: Secondary | ICD-10-CM

## 2023-02-14 LAB — GLUCOSE, CAPILLARY
Glucose-Capillary: 92 mg/dL (ref 70–99)
Glucose-Capillary: 86 mg/dL (ref 70–99)
Glucose-Capillary: 98 mg/dL (ref 70–99)
Glucose-Capillary: 123 mg/dL — ABNORMAL HIGH (ref 70–99)

## 2023-02-14 NOTE — Progress Notes (Signed)
 Occupational Therapy Session Note  Patient Details  Name: Jennifer [name] MRN: [id number] Date of Birth: 20-May-1980  Today's Date: 02/15/2023 OT Individual Time: 8979-8874 OT Individual Time Calculation (min): 65 min    Short Term Goals: Week 1:  OT Short Term Goal 1 (Week 1): STG = LTG due to ELOS  Skilled Therapeutic Interventions/Progress Updates:  Pt greeted sitting in [facility] for skilled OT session with focus on BADL participation.   Pain: Pt reported 30/10 pain, medicated immediately before start of session. OT offering intermediate rest breaks and positioning suggestions throughout session to address pain/fatigue and maximize participation/safety in session.   Functional Transfers: Pt performs room-level WC mobility with distant supervision. Stand-pivot from WC<>toilet/BSC with close supervision + use of grab bar. Stand-pivot from WC>R EOB with CGA due to patient stating  . . . I never get in on this side.   Self Care Tasks: Pt completes 3/3 toileting activities with close supervision + use of grab bar. See flowsheets. Sink-side grooming with setup/supervision, standing pericare with use on sink-side for standing balance. Pt assisting with doffing/donning of shinker (using tube for decreased resistance to don) for skin/incision assessment by RN. Pt with skin breakdown underneath stomach and medial thigh, RN present and aware. Increased time required to manage shinker due to heightened pain.   Pt remained resting in bed with 4Ps assessed and immediate needs met. Pt continues to be appropriate for skilled OT intervention to promote further functional independence in ADLs/IADLs.   Therapy Documentation Precautions:  Precautions Precautions: Fall Restrictions Weight Bearing Restrictions Per Provider Order: Yes RLE Weight Bearing Per Provider Order: Non weight bearing   Therapy/Group: Individual Therapy  Nereida Habermann, OTR/L, MSOT  02/15/2023, 7:51 AM

## 2023-02-14 NOTE — Progress Notes (Signed)
 PROGRESS NOTE   Subjective/Complaints:  Pt doing well, slept ok, pain manageable or more so than before at least; LBM yesterday per pt (not documented), urinating fine, denies any other complaints or concerns today.   ROS: +minimal pain to residual limb Per HPI, denies CP, SOB, abd pain, n/v/d/c  Objective:   No results found. Recent Labs    02/12/23 0641 02/13/23 1712  WBC 8.6 6.8  HGB 8.2* 7.7*  HCT 27.4* 25.9*  PLT 454* 401*   Recent Labs    02/13/23 1712  NA 132*  K 4.2  CL 97*  CO2 24  GLUCOSE 118*  BUN 18  CREATININE 1.46*  CALCIUM  9.2    Intake/Output Summary (Last 24 hours) at 02/14/2023 1104 Last data filed at 02/14/2023 0824 Gross per 24 hour  Intake 720 ml  Output --  Net 720 ml        Physical Exam: Vital Signs Blood pressure 119/75, pulse 78, temperature 98.2 F (36.8 C), resp. rate 16, height 5' 6 (1.676 m), weight 77.8 kg, SpO2 99%.  Gen: no distress, normal appearing, sitting up in bed.  HEENT: oral mucosa pink and moist, NCAT Cardio: Reg rate and rhythm, no m/r/g appreciated Chest: normal effort, normal rate of breathing, CTAB Abd: soft, non-distended, nonTTP, +BS throughout Ext: no edema in LLE, RLE AKA, shrinker in place Psych: pleasant, normal affect Skin: R AKA incisions not assessed today Psych: Flat but appropriate.   PRIOR EXAMS: Neuro: AAOx4. No apparent cognitive deficits + Phantom sensations in RLE Sensation intact. Strength 0/5 RLE, otherwise 5/5. No apparent cognitive deficits. CN 2-12 intact. Reflexes 2+.   Musculoskeletal:      + R-AKA incisions C/D/I with staples and sutures in place.  AROM RLE HF limited due to pain.    Skin: Skin is warm and dry. Small amount of serousanguinous drainage form surgical site.     Assessment/Plan: 1. Functional deficits which require 3+ hours per day of interdisciplinary therapy in a comprehensive inpatient rehab  setting. Physiatrist is providing close team supervision and 24 hour management of active medical problems listed below. Physiatrist and rehab team continue to assess barriers to discharge/monitor patient progress toward functional and medical goals  Care Tool:  Bathing    Body parts bathed by patient: Right arm, Left arm, Chest, Abdomen, Front perineal area, Buttocks, Right upper leg, Left upper leg, Face   Body parts bathed by helper: Left lower leg Body parts n/a: Right lower leg   Bathing assist Assist Level: Minimal Assistance - Patient > 75%     Upper Body Dressing/Undressing Upper body dressing   What is the patient wearing?: Pull over shirt    Upper body assist Assist Level: Set up assist    Lower Body Dressing/Undressing Lower body dressing      What is the patient wearing?: Pants, Underwear/pull up, Ace wrap/stump shrinker     Lower body assist Assist for lower body dressing: Maximal Assistance - Patient 25 - 49%     Toileting Toileting    Toileting assist Assist for toileting: Moderate Assistance - Patient 50 - 74%     Transfers Chair/bed transfer  Transfers assist  Chair/bed transfer assist level: Contact Guard/Touching assist     Locomotion Ambulation   Ambulation assist      Assist level: Minimal Assistance - Patient > 75% Assistive device: Walker-rolling Max distance: 77ft   Walk 10 feet activity   Assist     Assist level: Minimal Assistance - Patient > 75% Assistive device: Walker-rolling   Walk 50 feet activity   Assist Walk 50 feet with 2 turns activity did not occur: Safety/medical concerns (fatigue, pain)         Walk 150 feet activity   Assist Walk 150 feet activity did not occur: Safety/medical concerns (fatigue, pain)         Walk 10 feet on uneven surface  activity   Assist Walk 10 feet on uneven surfaces activity did not occur: Safety/medical concerns (fatigue, pain)          Wheelchair     Assist Is the patient using a wheelchair?: Yes Type of Wheelchair: Manual    Wheelchair assist level: Supervision/Verbal cueing Max wheelchair distance: 70ft    Wheelchair 50 feet with 2 turns activity    Assist        Assist Level: Supervision/Verbal cueing   Wheelchair 150 feet activity     Assist      Assist Level: Moderate Assistance - Patient 50 - 74%   Blood pressure 119/75, pulse 78, temperature 98.2 F (36.8 C), resp. rate 16, height 5' 6 (1.676 m), weight 77.8 kg, SpO2 99%.  Medical Problem List and Plan: 1. 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  Antithrombotics: -DVT/anticoagulation:  Pharmaceutical: Eliquis  5mg  BID             -antiplatelet therapy: N/A 3. Pain Management:  Continue MS contin  30mg  BID--change IV dilaudid  to po prn             --Gabapentin  400mg  QID for neuropathy and encourage desensitization             --Flexeril  prn for muscle spasms. - Discussed wean down on pain medication with patient prior to IRF admission; she endorses 5/10 pain if asking for PRNs   4. Mood/Behavior/Sleep: LCSW to follow for evaluation and support --has trazodone  100mg  at bedtime for insomnia but having difficulty sleeping due to adjustment issues.   -02/14/23 slept ok, cont regimen             -antipsychotic agents: N/A   5. Neuropsych/cognition: This patient is capable of making decisions on her own behalf.   6. Skin/Wound Care: Monitor wound for healing. Add vitamin C  to promote healing.               - Encourage wrapping/compression for edema    7. Fluids/Electrolytes/Nutrition: Monitor I/O. Encourage fluid intake   8. T1DM: Hgb A1C-9.2. Was on 70/30 45 units TID AC prior to admission  --continue Insulin  glargine 20U BID w/SSI for now. May need to  transition back to home regimen due to insurance  Provided list of foods for good diabetes control  -02/14/23 CBGs great, cont regimen  CBG (last 3)  Recent Labs    02/13/23 1622 02/13/23 2040 02/14/23 9353  GLUCAP 123* 117* 92      9. A fib: Monitor HR TID--continue Cardizem  180mg  daily and Eliquis .  10. OSA: Encourage CPAP use.  11. Hyponatremia: Likely related to poor intake which is up to 60% in past 24 hrs.              -02/13/23 132, fairly stable, monitor as indicated 12. Acute on chronic renal failure?: BUN/SCr 14/1.33 at admission-->BUN/SCr 24/1.4             --encourage fluid intake.  13. Iron  deficiency/Anemia of chronic disease: Baseline Hgb is around 8 due to hx of menorrhagia/Fibroids               --continue Iron  supplement 14. Depression/Neuropathy: Discussed and open to starting Cymbalta  20mg  daily-->added  15. Overweight: provided dietary education.   16. Constipation: cont miralax  daily, senokot 2 tabs nightly -02/14/23 LBM yesterday per pt (not documented), cont regimen but need documentation    LOS: 2 days A FACE TO FACE EVALUATION WAS PERFORMED  [address] 02/14/2023, 11:04 AM

## 2023-02-14 NOTE — Plan of Care (Signed)
  Problem: RH PAIN MANAGEMENT Goal: RH STG PAIN MANAGED AT OR BELOW PT'S PAIN GOAL Description: < 4 with PRN Outcome: Not Progressing; patient c/o 10/10pain ; phantom limb pain ; due and prn meds given; MD notified

## 2023-02-14 NOTE — Plan of Care (Signed)
  Problem: Consults Goal: RH LIMB LOSS PATIENT EDUCATION Description: Description: See Patient Education module for eduction specifics. Outcome: Progressing   Problem: RH BOWEL ELIMINATION Goal: RH STG MANAGE BOWEL WITH ASSISTANCE Description: STG Manage Bowel with MOD I Assistance. Outcome: Progressing Goal: RH STG MANAGE BOWEL W/MEDICATION W/ASSISTANCE Description: STG Manage Bowel with Medication with MOD I Assistance. Outcome: Progressing   Problem: RH SKIN INTEGRITY Goal: RH STG ABLE TO PERFORM INCISION/WOUND CARE W/ASSISTANCE Description: STG Able To Perform Incision/Wound Care With Min  A Assistance. Outcome: Progressing   Problem: RH SAFETY Goal: RH STG ADHERE TO SAFETY PRECAUTIONS W/ASSISTANCE/DEVICE Description: STG Adhere to Safety Precautions With Cues  Outcome: Progressing   Problem: RH PAIN MANAGEMENT Goal: RH STG PAIN MANAGED AT OR BELOW PT'S PAIN GOAL Description: < 4 with PRN Outcome: Progressing   Problem: RH KNOWLEDGE DEFICIT LIMB LOSS Goal: RH STG INCREASE KNOWLEDGE OF SELF CARE AFTER LIMB LOSS Description: Patient and spouse with be able to manage at home independently with educational resources Outcome: Progressing   Problem: Education: Goal: Ability to describe self-care measures that may prevent or decrease complications (Diabetes Survival Skills Education) will improve Outcome: Progressing Goal: Individualized Educational Video(s) Outcome: Progressing   Problem: Coping: Goal: Ability to adjust to condition or change in health will improve Outcome: Progressing   Problem: Fluid Volume: Goal: Ability to maintain a balanced intake and output will improve Outcome: Progressing   Problem: Health Behavior/Discharge Planning: Goal: Ability to identify and utilize available resources and services will improve Outcome: Progressing Goal: Ability to manage health-related needs will improve Outcome: Progressing   Problem: Metabolic: Goal: Ability to  maintain appropriate glucose levels will improve Outcome: Progressing   Problem: Nutritional: Goal: Maintenance of adequate nutrition will improve Outcome: Progressing Goal: Progress toward achieving an optimal weight will improve Outcome: Progressing   Problem: Skin Integrity: Goal: Risk for impaired skin integrity will decrease Outcome: Progressing   Problem: Tissue Perfusion: Goal: Adequacy of tissue perfusion will improve Outcome: Progressing

## 2023-02-15 DIAGNOSIS — G546 Phantom limb syndrome with pain: Secondary | ICD-10-CM

## 2023-02-15 LAB — GLUCOSE, CAPILLARY
Glucose-Capillary: 114 mg/dL — ABNORMAL HIGH (ref 70–99)
Glucose-Capillary: 90 mg/dL (ref 70–99)
Glucose-Capillary: 105 mg/dL — ABNORMAL HIGH (ref 70–99)
Glucose-Capillary: 101 mg/dL — ABNORMAL HIGH (ref 70–99)

## 2023-02-15 MED ORDER — GABAPENTIN 300 MG PO CAPS
600.0000 mg | ORAL_CAPSULE | Freq: Four times a day (QID) | ORAL | Status: DC
  Administered 2023-02-15 – 2023-02-16 (×5): 600 mg via ORAL
  Filled 2023-02-15 (×5): qty 2

## 2023-02-15 NOTE — Progress Notes (Addendum)
 PROGRESS NOTE   Subjective/Complaints:  Pt doing ok today, slept ok. Pain manageable for the most part when she takes meds, but feels the phantom pain is getting worse. States her pain meds (narcotics) make her very sleepy-- discussed that we wouldn't want to increase the dose since that would worsen the drowsiness, but perhaps finding a better schedule on which to take the PRNs (I.e. not too close together, more spaced out). She mentions that she used to take gabapentin  800mg  QID-- but I can't find this anywhere. Most I see is 600mg  TID. Agreed we could go up from 400mg  to 600mg  QID here, and see if this makes a difference for her phantom pain.  LBM yesterday per pt, urinating fine, denies any other complaints or concerns today.   ROS: +pain to residual limb, phantom pain Per HPI, denies CP, SOB, abd pain, n/v/d/c  Objective:   No results found. Recent Labs    02/13/23 1712  WBC 6.8  HGB 7.7*  HCT 25.9*  PLT 401*   Recent Labs    02/13/23 1712  NA 132*  K 4.2  CL 97*  CO2 24  GLUCOSE 118*  BUN 18  CREATININE 1.46*  CALCIUM  9.2    Intake/Output Summary (Last 24 hours) at 02/15/2023 1033 Last data filed at 02/15/2023 0748 Gross per 24 hour  Intake 474 ml  Output --  Net 474 ml        Physical Exam: Vital Signs Blood pressure 118/75, pulse 80, temperature 98.2 F (36.8 C), resp. rate 18, height 5' 6 (1.676 m), weight 77.8 kg, SpO2 96%.  Gen: no distress, normal appearing, sitting up at EOB, rubbing AKA stump.  HEENT: oral mucosa pink and moist, NCAT Cardio: Reg rate and rhythm, no m/r/g appreciated Chest: normal effort, normal rate of breathing, CTAB Abd: soft, non-distended, nonTTP, +BS throughout Ext: no edema in LLE, RLE AKA, shrinker in place Psych: pleasant, normal affect Skin: R AKA incisions not assessed today Psych: Flat but appropriate.   PRIOR EXAMS: Neuro: AAOx4. No apparent cognitive  deficits + Phantom sensations in RLE Sensation intact. Strength 0/5 RLE, otherwise 5/5. No apparent cognitive deficits. CN 2-12 intact. Reflexes 2+.   Musculoskeletal:      + R-AKA incisions C/D/I with staples and sutures in place.  AROM RLE HF limited due to pain.    Skin: Skin is warm and dry. Small amount of serousanguinous drainage form surgical site.     Assessment/Plan: 1. Functional deficits which require 3+ hours per day of interdisciplinary therapy in a comprehensive inpatient rehab setting. Physiatrist is providing close team supervision and 24 hour management of active medical problems listed below. Physiatrist and rehab team continue to assess barriers to discharge/monitor patient progress toward functional and medical goals  Care Tool:  Bathing    Body parts bathed by patient: Right arm, Left arm, Chest, Abdomen, Front perineal area, Buttocks, Right upper leg, Left upper leg, Face   Body parts bathed by helper: Left lower leg Body parts n/a: Right lower leg   Bathing assist Assist Level: Minimal Assistance - Patient > 75%     Upper Body Dressing/Undressing Upper body dressing   What is  the patient wearing?: Pull over shirt    Upper body assist Assist Level: Set up assist    Lower Body Dressing/Undressing Lower body dressing      What is the patient wearing?: Pants, Underwear/pull up, Ace wrap/stump shrinker     Lower body assist Assist for lower body dressing: Maximal Assistance - Patient 25 - 49%     Toileting Toileting    Toileting assist Assist for toileting: Moderate Assistance - Patient 50 - 74%     Transfers Chair/bed transfer  Transfers assist     Chair/bed transfer assist level: Minimal Assistance - Patient > 75%     Locomotion Ambulation   Ambulation assist      Assist level: Minimal Assistance - Patient > 75% Assistive device: Walker-rolling Max distance: 35ft   Walk 10 feet activity   Assist     Assist level:  Minimal Assistance - Patient > 75% Assistive device: Walker-rolling   Walk 50 feet activity   Assist Walk 50 feet with 2 turns activity did not occur: Safety/medical concerns (fatigue, pain)         Walk 150 feet activity   Assist Walk 150 feet activity did not occur: Safety/medical concerns (fatigue, pain)         Walk 10 feet on uneven surface  activity   Assist Walk 10 feet on uneven surfaces activity did not occur: Safety/medical concerns (fatigue, pain)         Wheelchair     Assist Is the patient using a wheelchair?: Yes Type of Wheelchair: Manual    Wheelchair assist level: Supervision/Verbal cueing Max wheelchair distance: 59ft    Wheelchair 50 feet with 2 turns activity    Assist        Assist Level: Supervision/Verbal cueing   Wheelchair 150 feet activity     Assist      Assist Level: Moderate Assistance - Patient 50 - 74%   Blood pressure 118/75, pulse 80, temperature 98.2 F (36.8 C), resp. rate 18, height 5' 6 (1.676 m), weight 77.8 kg, SpO2 96%.  Medical Problem List and Plan: 1. 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  Antithrombotics: -DVT/anticoagulation:  Pharmaceutical: Eliquis  5mg  BID             -antiplatelet therapy: N/A 3. Pain Management:  Continue MS contin  30mg  BID--change IV dilaudid  to po prn             --Gabapentin  400mg  QID for neuropathy and encourage desensitization             --Flexeril  prn for muscle spasms. - Discussed wean down on pain medication with patient prior to IRF admission; she endorses 5/10 pain if asking for PRNs -02/15/23 pt having a hard time with phantom pain, reports previously was on Gabapentin  800mg  QID but I can't find this-- I see 600mg  TID in prior H&Ps. For now, will increase to 600mg  QID from 400mg , and  see if this makes a difference-- weekday team might need to reach out to her pain doctor to see what she was actually taking previously.  -Encouraged pt to space out narcotics to avoid side effects and  have less time between a dose and improve pain control   4. Mood/Behavior/Sleep: LCSW to follow for evaluation and support --has trazodone  100mg  at bedtime for insomnia but having difficulty sleeping due to adjustment issues.   -02/14/23 slept ok, cont regimen             -antipsychotic agents: N/A   5. Neuropsych/cognition: This patient is capable of making decisions on her own behalf.   6. Skin/Wound Care: Monitor wound for healing. Add vitamin C  to promote healing.               - Encourage wrapping/compression for edema    7. Fluids/Electrolytes/Nutrition: Monitor I/O. Encourage fluid intake   8. T1DM: Hgb A1C-9.2. Was on 70/30 45 units TID AC prior to admission  --continue Insulin  glargine 20U BID w/SSI for now. May need to transition back to home regimen due to insurance  Provided list of foods for good diabetes control  -1/11-12/25 CBGs great, cont regimen  CBG (last 3)  Recent Labs    02/14/23 1641 02/14/23 2051 02/15/23 0608  GLUCAP 98 123* 114*      9. A fib: Monitor HR TID--continue Cardizem  180mg  daily and Eliquis .  10. OSA: Encourage CPAP use.  11. Hyponatremia: Likely related to poor intake which is up to 60% in past 24 hrs.              -02/13/23 132, fairly stable, monitor as indicated 12. Acute on chronic renal failure?: BUN/SCr 14/1.33 at admission-->BUN/SCr 24/1.4             --encourage fluid intake.  13. Iron  deficiency/Anemia of chronic disease: Baseline Hgb is around 8 due to hx of menorrhagia/Fibroids               --continue Iron  supplement 14. Depression/Neuropathy: Discussed and open to starting Cymbalta  20mg  daily-->added  15. Overweight: provided dietary education.   16. Constipation: cont miralax  daily, senokot 2 tabs nightly -02/15/23 LBM yesterday,  cont regimen   I spent >76mins performing patient care related activities, including face to face time, documentation time, review of meds, overall med management, and overall coordination of care.   LOS: 3 days A FACE TO FACE EVALUATION WAS PERFORMED  [address] 02/15/2023, 10:33 AM

## 2023-02-15 NOTE — Plan of Care (Signed)
  Problem: RH PAIN MANAGEMENT Goal: RH STG PAIN MANAGED AT OR BELOW PT'S PAIN GOAL Description: < 4 with PRN Outcome: Not Progressing; c/o pain due and prn meds given; shrinker changed today and had  a hard time putting a new one due to extreme pain. OT and RN in room

## 2023-02-15 NOTE — Progress Notes (Signed)
 Physical Therapy Session Note  Patient Details  Name: Jennifer [name] MRN: [id number] Date of Birth: 04-21-1980  Today's Date: 02/15/2023 PT Individual Time: 9070-8986 PT Individual Time Calculation (min): 44 min   Short Term Goals: Week 1:  PT Short Term Goal 1 (Week 1): STG=LTG due to LOS  Skilled Therapeutic Interventions/Progress Updates:       Pt seated EOB upon arrival. Pt agreeable to therapy. Pt reports 30/10 R LE phantom pain referring to R LE residual limb, pt reports this is new since yesterday. Notified nursing,nursing present during session to administer pain medication,  trialed mirror therapy and desensitization techniques.   Trailed mirror therapy, pt performed the following therex to L LE with verbal cues provided to focus on reflection of L LE in the mirror: 1x10 ankle pumps, ankle circles x15 CW/CCW, ankle alphabet, 1x10long sitting hip abduction, 1x10quad sets, and 1x10heel slides. Pt reports no change in pain/sesnation.   Education provided for desensitization technique, pt reports she has been doing it religiously and it is the only thing that has allowed her to sleep.   Pt expressed concerns about recurrent infection, pt refusing to take off shinker today to assess incision/change dressing as needed, pt requesting to wait until primary OT-hope is back. No evidence of drainage through shrinker. No other signs/symptoms of infection present. Education provided to pt that the shrinker will assist in reducing swelling and help to shape the residual limb.   Pt performed stand pivot transfer bed to [facility] with RW and CGA/close supervision.    Pt preformed the following therex for B LE strengthening/contracture prevention,verbal cues provided for upright posture, and hip extension.  Standing R hip flexion x10 standing R hip extension x10 standing R hip abduction x10    Therapist recommended having family bring tennis shoe from home for L LE to reduce friction and shear  forces. Pt verbalizes understanding and plans to have pt husband bring them this afternoon.   Pt seated in [facility] with all needs within reach.    Therapy Documentation Precautions:  Precautions Precautions: Fall Restrictions Weight Bearing Restrictions Per Provider Order: Yes RLE Weight Bearing Per Provider Order: Non weight bearing  Therapy/Group: Individual Therapy  [facility] [name], [place], DPT  02/15/2023, 7:38 AM

## 2023-02-15 NOTE — Progress Notes (Signed)
 Physical Therapy Session Note  Patient Details  Name: Jennifer [name] MRN: [id number] Date of Birth: 02-03-1981  Today's Date: 02/15/2023 PT Individual Time: 9199-9143 and 1132-1200 PT Individual Time Calculation (min): 56 min and 28 min.  Short Term Goals: Week 1:  PT Short Term Goal 1 (Week 1): STG=LTG due to LOS  Skilled Therapeutic Interventions/Progress Updates:   First session:  Pt presents almost sitting 90 degrees in bed and c/o pain/phantom pain to R limb.  Pt agreeable to performing there ex in bed.  Pt performed chest press 3 x 10 w/ 2# weighted bar, biceps curls in supine w/ 3# bar, L HS 3 x 10 w/ 3# weight reduced to no weight.  Pt performed hip flexion RLE w/ AAROM 2 x 8.  Pt requires rest breaks 2/2 pain.  Pt agreeable to transferring OOB.  Pt transfers sup to sit w/ HOB elevated and supervision, increased time.  Pt transferred sit to stand w/ CGA.  Pt performed SPT using siderail w/ min/CGA.  Pt negotiated w/c to small gym w/ supervision.  Pt performed overhead press 3 x 10 w/ 2# bar and LAQ LLE w 2# weight 3 x 10.  Pt transferred sit to stand w/ CGA and transferred to new w/c (brakes need tightened) w/ pivoting.  RW height adjusted and pt able to amb x 15' x 2 w/ RW and min A, w/ foot clearance.  Pt returned to room and performed SPT w/c > bed w/ min A.  Pt remaind sitting EOB w/ bed alarm on and all needs in reach.    Second session:Pt presents semi-reclined in bed and agreeable to therapy.  New shrinker application completed using tube.  Pt transferred sup to sit w/ supervision and increased cues to Right.  Pt transfers sit to stand w/ min A and SPT to w/c.  Pt moves quickly and stands before w/c in position.  Pt negotiated w/c to small gym w/ supervision.  Pt performs throwing/caching dice w/o trunk support.  Pt performed seated overhead press w/ 4.4# ball, then overhead and then to chest press 2 x 10.  Pt performed trunk rotation side to side w/ same ball.  Pt returned to room  and amb x 15' w/ RW to opposite side of bed w/ min A and cues for foot clearance.  Pt remained sitting EOB w/ bed alarm on and all needs in reach.      Therapy Documentation Precautions:  Precautions Precautions: Fall Restrictions Weight Bearing Restrictions Per Provider Order: Yes RLE Weight Bearing Per Provider Order: Non weight bearing General:   Vital Signs: Therapy Vitals BP: 118/75 Pain:10/10, 8/10 2nd session.      Therapy/Group: Individual Therapy  Keonia Pasko P Denisha Hoel 02/15/2023, 9:00 AM

## 2023-02-15 NOTE — Progress Notes (Signed)
 Pharmacy re: gabapentin   Informed M. Street, PA-C of prior prescriptions for Gabapentin  800 mg QID filled 12/08/22 and 01/07/23.  Has been in the hospital since 01/15/23  at [facility] > CIR > back to inpatient > CIR again 02/12/23.   - During 12/12>>12/20 [facility] admit, was on Gabapentin  800 mg TID then switched to Lyrica  for a few days, then back to lower dose of Gabapentin  due to renal function.  Creatinine peaked at 2.16 on 01/21/23. Last 3 checks in 1.4-1.5 range. - Gabapentin  dose increased from 400 to 600 QID today, so equivalent to 800 mg TID. Usual max for current renal function is 1800 mg/day.  Monitor for tolerance.    Jennifer [name], [place] 02/15/2023 2:24 PM

## 2023-02-15 NOTE — IPOC Note (Signed)
 Overall Plan of Care [facility]) Patient Details Name: Jennifer [name] MRN: [id number] DOB: 06-Mar-1980  Admitting Diagnosis: Above knee amputation of right lower extremity [facility])  Hospital Problems: Principal Problem:   Above knee amputation of right lower extremity (HCC)     Functional Problem List: Nursing Pain, Endurance, Safety, Medication Management, Bowel  PT Balance, Edema, Endurance, Motor, Pain, Sensory, Skin Integrity  OT Balance, Edema, Endurance, Motor, Pain, Skin Integrity  SLP    TR         Basic ADL's: OT Bathing, Dressing, Toileting     Advanced  ADL's: OT Simple Meal Preparation     Transfers: PT Bed Mobility, Bed to Chair, Car, Occupational Psychologist, Research Scientist (life Sciences): PT Ambulation, Psychologist, Prison And Probation Services, Stairs     Additional Impairments: OT None  SLP        TR      Anticipated Outcomes Item Anticipated Outcome  Self Feeding Independent  Swallowing      Basic self-care  Mod I  Toileting  Mod I   Bathroom Transfers Mod I  Bowel/Bladder  manage bowel MOD I  Transfers  Mod I with LRAD  Locomotion  Mod I with LRAD  Communication     Cognition     Pain  <4 with PRN  Safety/Judgment  manage with cues   Therapy Plan: PT Intensity: Minimum of 1-2 x/day ,45 to 90 minutes PT Frequency: 5 out of 7 days PT Duration Estimated Length of Stay: 7 days OT Intensity: Minimum of 1-2 x/day, 45 to 90 minutes OT Frequency: 5 out of 7 days OT Duration/Estimated Length of Stay: 7-10 days     Team Interventions: Nursing Interventions Patient/Family Education, Bowel Management, Pain Management, Discharge Planning, Medication Management, Disease Management/Prevention  PT interventions Ambulation/gait training, Discharge planning, Functional mobility training, Psychosocial support, Therapeutic Activities, Balance/vestibular training, Disease management/prevention, Neuromuscular re-education, Skin care/wound management, Therapeutic Exercise,  Wheelchair propulsion/positioning, DME/adaptive equipment instruction, Pain management, Splinting/orthotics, UE/LE Strength taining/ROM, Community reintegration, Development worker, international aid stimulation, Patient/family education, Museum/gallery curator, UE/LE Coordination activities  OT Interventions Warden/ranger, Discharge planning, Pain management, Self Care/advanced ADL retraining, Therapeutic Activities, UE/LE Coordination activities, Disease mangement/prevention, Functional mobility training, Skin care/wound managment, Patient/family education, Therapeutic Exercise, DME/adaptive equipment instruction, Neuromuscular re-education, Psychosocial support, UE/LE Strength taining/ROM, Wheelchair propulsion/positioning  SLP Interventions    TR Interventions    SW/CM Interventions Discharge Planning, Psychosocial Support, Patient/Family Education   Barriers to Discharge MD  Medical stability and Weight bearing restrictions  Nursing Home environment access/layout, Weight bearing restrictions one level, 4-8 step entry right rail  PT Inaccessible home environment, Decreased caregiver support, Home environment access/layout, Wound Care, Weight bearing restrictions, Other (comments)    OT Weight bearing restrictions, Wound Care, Lack of/limited family support, Home environment access/layout    SLP      SW Decreased caregiver support, Lack of/limited family support, Community Education Officer for SNF coverage     Team Discharge Planning: Destination: PT-Home ,OT- Home , SLP-  Projected Follow-up: PT-Outpatient PT, OT-  Home health OT, SLP-  Projected Equipment Needs: PT-To be determined, OT- To be determined, SLP-  Equipment Details: PT-has none, OT-  Patient/family involved in discharge planning: PT- Patient,  OT-Patient, SLP-   MD ELOS: 7-10 Medical Rehab Prognosis:  Excellent Assessment:   The patient has been admitted for CIR therapies with the diagnosis of  AKA. The team will be addressing functional  mobility, strength, stamina, balance, safety, adaptive techniques and equipment, self-care, bowel and bladder mgt, patient and caregiver  education. Goals have been set at mod I. Anticipated discharge destination is home.            See Team Conference Notes for weekly updates to the plan of care

## 2023-02-16 LAB — BASIC METABOLIC PANEL
Anion gap: 9 (ref 5–15)
BUN: 19 mg/dL (ref 6–20)
CO2: 27 mmol/L (ref 22–32)
Calcium: 9.4 mg/dL (ref 8.9–10.3)
Chloride: 99 mmol/L (ref 98–111)
Creatinine, Ser: 1.55 mg/dL — ABNORMAL HIGH (ref 0.44–1.00)
GFR, Estimated: 42 mL/min — ABNORMAL LOW (ref >60)
Glucose, Bld: 127 mg/dL — ABNORMAL HIGH (ref 70–99)
Potassium: 4.4 mmol/L (ref 3.5–5.1)
Sodium: 135 mmol/L (ref 135–145)

## 2023-02-16 LAB — CBC
HCT: 25.8 % — ABNORMAL LOW (ref 36.0–46.0)
Hemoglobin: 7.6 g/dL — ABNORMAL LOW (ref 12.0–15.0)
MCH: 21.8 pg — ABNORMAL LOW (ref 26.0–34.0)
MCHC: 29.5 g/dL — ABNORMAL LOW (ref 30.0–36.0)
MCV: 74.1 fL — ABNORMAL LOW (ref 80.0–100.0)
Platelets: 408 10*3/uL — ABNORMAL HIGH (ref 150–400)
RBC: 3.48 MIL/uL — ABNORMAL LOW (ref 3.87–5.11)
RDW: 20.9 % — ABNORMAL HIGH (ref 11.5–15.5)
WBC: 7.7 10*3/uL (ref 4.0–10.5)
nRBC: 0 % (ref 0.0–0.2)

## 2023-02-16 LAB — GLUCOSE, CAPILLARY
Glucose-Capillary: 108 mg/dL — ABNORMAL HIGH (ref 70–99)
Glucose-Capillary: 99 mg/dL (ref 70–99)
Glucose-Capillary: 80 mg/dL (ref 70–99)
Glucose-Capillary: 111 mg/dL — ABNORMAL HIGH (ref 70–99)

## 2023-02-16 MED ORDER — INSULIN GLARGINE-YFGN 100 UNIT/ML ~~LOC~~ SOLN
19.0000 [IU] | Freq: Two times a day (BID) | SUBCUTANEOUS | Status: DC
  Administered 2023-02-16: 19 [IU] via SUBCUTANEOUS
  Filled 2023-02-16 (×3): qty 0.19

## 2023-02-16 MED ORDER — DULOXETINE HCL 30 MG PO CPEP: 30.0000 mg | ORAL_CAPSULE | Freq: Every day | ORAL | Status: DC

## 2023-02-16 MED ORDER — DULOXETINE HCL 30 MG PO CPEP
30.0000 mg | ORAL_CAPSULE | Freq: Every day | ORAL | Status: DC
  Administered 2023-02-17 – 2023-02-19 (×3): 30 mg via ORAL
  Filled 2023-02-16 (×3): qty 1

## 2023-02-16 MED ORDER — CYCLOBENZAPRINE HCL 5 MG PO TABS
10.0000 mg | ORAL_TABLET | Freq: Three times a day (TID) | ORAL | Status: DC
  Administered 2023-02-16 – 2023-02-19 (×9): 10 mg via ORAL
  Filled 2023-02-16 (×9): qty 2

## 2023-02-16 MED ORDER — GABAPENTIN 400 MG PO CAPS
400.0000 mg | ORAL_CAPSULE | Freq: Three times a day (TID) | ORAL | Status: DC
  Administered 2023-02-16 – 2023-02-19 (×8): 400 mg via ORAL
  Filled 2023-02-16 (×8): qty 1

## 2023-02-16 NOTE — Progress Notes (Signed)
 Physical Therapy Session Note  Patient Details  Name: Jennifer [name] MRN: [id number] Date of Birth: 1980-03-10  Today's Date: 02/16/2023 PT Individual Time: 0916-1026 PT Individual Time Calculation (min): 70 min   Short Term Goals: Week 1:  PT Short Term Goal 1 (Week 1): STG=LTG due to LOS  Skilled Therapeutic Interventions/Progress Updates:      Pt supine in bed upon arrival. Pt agreeable to therapy. Pt reports 10/10 phantom pain from R calf raidating to residual limb, premedicated. Pt reports she discussed with MD this AM and adjustments are being made to the medications.   Stand pivot transfer to R with supervision with use of bed rail and WC arm rest.   Pt reports strap from shrinker causing irritation at site of previous c section. Pt reports nurse aware. Notified primary PT and OT to see if shrinker without strap is more appropriate.   Followed up with pt regarding shoes, pt husband hasn't been able to bring them 2/2 difficulty driving in the ice/snow.   Discussed pt shoewear at home, pt reports at home she wears slippers. Education provided for decreased sensation 2/2 diabetes and peripheral neuropathy putting pt at increased risk for pressure injury. Education provided for importance of wearing tennis shoe on R LE to reduce unnecessary shear forces. Education provided on importance of skin inspection B to assess for signs symptoms of pressure injuries/infection. Education provided for recognizing signs and symptoms of infection. Pt performed skin inspection L LE, with skin inspection mirror, therapist emphasized importance of assessing superior, inferior, medial, lateral and between toes, with emphasis on having 2nd person especially for in between toes. Discussed performing on R LE as well, pt requesting to wait until OT session 2/2 increased pain and not wanting to remove shrinker during this session.   Pt performed seated R hip flexion x5 to assist with independence with  donning/doffing shirnker/performing skin inspection, and strengthening. Pt only able to get minimal clearance 2/2 increased phantom pain. Recommended pt perform seated marching and supine SLR even when not in therapy to assist with this motion.    Discussed house, pt reports she got a new house for christmas, she is unaware if everywhere is wheelchair accessible or not, home measurement sheet provided. Pt reports she has a ramped entrance. Pt ascended/descended ramp in [facility] with supervision, therapist trialed ascending forwards and backwards, pt required supervision for forwards and min A for backwards, pt demos preference of ascending forwards.   Pt performed stand pivot transfer x3 each direction with no AD WC to mat table with supervision to L, and CGA progressing to supervision to R, verbal cues provided for UE positioning and head hip ratio.   Pt seated in WC at end of session with all needs within reach.         Therapy Documentation Precautions:  Precautions Precautions: Fall Restrictions Weight Bearing Restrictions Per Provider Order: (P) Yes RLE Weight Bearing Per Provider Order: Non weight bearing  Therapy/Group: Individual Therapy  [facility] [name], [place], DPT  02/16/2023, 7:32 AM

## 2023-02-16 NOTE — Progress Notes (Signed)
 Ok to reduce gabapentin to 400mg  TID due to her renal function per Dr. Carlis Abbott.  Ulyses Southward, PharmD, BCIDP, AAHIVP, CPP Infectious Disease Pharmacist 02/16/2023 1:30 PM

## 2023-02-16 NOTE — Progress Notes (Signed)
 PROGRESS NOTE   Subjective/Complaints: C/o worsening right lower extremity phantom limb pain- pain starts in her calf and radiates upward to her thigh. Discussed increasing cymbalta  and she is agreeable  ROS: +pain to residual limb, phantom pain- worsened Per HPI, denies CP, SOB, abd pain, n/v/d/c  Objective:   No results found. Recent Labs    02/13/23 1712 02/16/23 0602  WBC 6.8 7.7  HGB 7.7* 7.6*  HCT 25.9* 25.8*  PLT 401* 408*   Recent Labs    02/13/23 1712 02/16/23 0602  NA 132* 135  K 4.2 4.4  CL 97* 99  CO2 24 27  GLUCOSE 118* 127*  BUN 18 19  CREATININE 1.46* 1.55*  CALCIUM  9.2 9.4    Intake/Output Summary (Last 24 hours) at 02/16/2023 0955 Last data filed at 02/16/2023 0815 Gross per 24 hour  Intake 120 ml  Output 1 ml  Net 119 ml        Physical Exam: Vital Signs Blood pressure 105/67, pulse 80, temperature 98.7 F (37.1 C), resp. rate 17, height 5' 6 (1.676 m), weight 77.8 kg, SpO2 95%.  Gen: no distress, normal appearing, sitting up at EOB, rubbing AKA stump.  HEENT: oral mucosa pink and moist, NCAT Cardio: Reg rate and rhythm, no m/r/g appreciated Chest: normal effort, normal rate of breathing, CTAB Abd: soft, non-distended, nonTTP, +BS throughout Ext: no edema in LLE, RLE AKA, shrinker in place Psych: pleasant, normal affect Skin: R AKA incisions not assessed today Psych: Flat but appropriate.  Neuro: AAOx4. No apparent cognitive deficits + Phantom sensations in RLE Sensation intact. Strength 0/5 RLE, otherwise 5/5. No apparent cognitive deficits. CN 2-12 intact. Reflexes 2+. Stable 1/13  Musculoskeletal:      + R-AKA incisions C/D/I with staples and sutures in place.  AROM RLE HF limited due to pain.    Skin: Skin is warm and dry. Small amount of serousanguinous drainage form surgical site.     Assessment/Plan: 1. Functional deficits which require 3+ hours per day of  interdisciplinary therapy in a comprehensive inpatient rehab setting. Physiatrist is providing close team supervision and 24 hour management of active medical problems listed below. Physiatrist and rehab team continue to assess barriers to discharge/monitor patient progress toward functional and medical goals  Care Tool:  Bathing    Body parts bathed by patient: Right arm, Left arm, Chest, Abdomen, Front perineal area, Buttocks, Right upper leg, Left upper leg, Face   Body parts bathed by helper: Left lower leg Body parts n/a: Right lower leg   Bathing assist Assist Level: Minimal Assistance - Patient > 75%     Upper Body Dressing/Undressing Upper body dressing   What is the patient wearing?: Pull over shirt    Upper body assist Assist Level: Set up assist    Lower Body Dressing/Undressing Lower body dressing      What is the patient wearing?: Pants, Underwear/pull up, Ace wrap/stump shrinker     Lower body assist Assist for lower body dressing: Maximal Assistance - Patient 25 - 49%     Toileting Toileting    Toileting assist Assist for toileting: Moderate Assistance - Patient 50 - 74%  Transfers Chair/bed transfer  Transfers assist     Chair/bed transfer assist level: Minimal Assistance - Patient > 75%     Locomotion Ambulation   Ambulation assist      Assist level: Minimal Assistance - Patient > 75% Assistive device: Walker-rolling Max distance: 94ft   Walk 10 feet activity   Assist     Assist level: Minimal Assistance - Patient > 75% Assistive device: Walker-rolling   Walk 50 feet activity   Assist Walk 50 feet with 2 turns activity did not occur: Safety/medical concerns (fatigue, pain)         Walk 150 feet activity   Assist Walk 150 feet activity did not occur: Safety/medical concerns (fatigue, pain)         Walk 10 feet on uneven surface  activity   Assist Walk 10 feet on uneven surfaces activity did not occur:  Safety/medical concerns (fatigue, pain)         Wheelchair     Assist Is the patient using a wheelchair?: Yes Type of Wheelchair: Manual    Wheelchair assist level: Supervision/Verbal cueing Max wheelchair distance: 6ft    Wheelchair 50 feet with 2 turns activity    Assist        Assist Level: Supervision/Verbal cueing   Wheelchair 150 feet activity     Assist      Assist Level: Moderate Assistance - Patient 50 - 74%   Blood pressure 105/67, pulse 80, temperature 98.7 F (37.1 C), resp. rate 17, height 5' 6 (1.676 m), weight 77.8 kg, SpO2 95%.  Medical Problem List and Plan: 1. 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  Antithrombotics: -DVT/anticoagulation:  Pharmaceutical: Eliquis  5mg  BID             -antiplatelet therapy: N/A 3. Pain Management:  Continue MS contin  30mg  BID--change IV dilaudid  to po prn. Increase cymbalta  to 30mg  daily             --Gabapentin  400mg  QID for neuropathy and encourage desensitization             --Flexeril  prn for muscle spasms. - Discussed wean down on pain medication with patient prior to IRF admission; she endorses 5/10 pain if asking for PRNs -02/15/23 pt having a hard time with phantom pain, reports previously was on Gabapentin  800mg  QID but I can't find this-- I see 600mg  TID in prior H&Ps. For now, will increase to 600mg  QID from 400mg , and see if this makes a difference-- weekday team might need to reach out to her pain doctor to see what she was actually taking previously.  -Encouraged pt to space out narcotics to avoid side effects and have less time between a dose and improve pain control   4. Mood/Behavior/Sleep: LCSW to follow for evaluation and support --has trazodone  100mg  at bedtime for insomnia but having difficulty sleeping due to  adjustment issues.   -02/14/23 slept ok, cont regimen             -antipsychotic agents: N/A   5. Neuropsych/cognition: This patient is capable of making decisions on her own behalf.   6. Skin/Wound Care: Monitor wound for  healing. Add vitamin C  to promote healing.               - Encourage wrapping/compression for edema    7. Fluids/Electrolytes/Nutrition: Monitor I/O. Encourage fluid intake   8. T1DM: Hgb A1C-9.2. Was on 70/30 45 units TID AC prior to admission  Provided list of foods for good diabetes control  D/c ISS and decrease Semglee  to 19U BID  CBG (last 3)  Recent Labs    02/15/23 1617 02/15/23 2120 02/16/23 0643  GLUCAP 105* 101* 108*      9. A fib: Monitor HR TID--continue Cardizem  180mg  daily and Eliquis .  10. OSA: Encourage CPAP use.  11. Hyponatremia: Likely related to poor intake which is up to 60% in past 24 hrs.              -02/13/23 132, fairly stable, monitor as indicated 12. Acute on chronic renal failure?: BUN/SCr 14/1.33 at admission-->BUN/SCr 24/1.4             --encourage fluid intake.  13. Iron  deficiency/Anemia of chronic disease: Baseline Hgb is around 8 due to hx of menorrhagia/Fibroids               --continue Iron  supplement  14. Depression: increase cymbalta  to 30mg  daily  15. Overweight: provided dietary education.   16. Constipation: Continue miralax  daily, senokot 2 tabs nightly     LOS: 4 days A FACE TO FACE EVALUATION WAS PERFORMED  Sven P Jennifer [name] 02/16/2023, 9:55 AM

## 2023-02-16 NOTE — Progress Notes (Addendum)
 Patient ID: Jennifer [name], female   DOB: 1980-12-29, 43 y.o.   MRN: [id number]  67- SW spoke with pt husband to inform on ELOS. SW will confirm with him if family edu is needed since he works 12hr shifts.   1045-SW followed up with pt husband to discuss family edu is recommended. Fam edu on Wed 8am-10am. SW will confirm d/c date after team conference.   *Sw followed up with [employer] requesting for DME to be delivered on Thursday.   Graeme Jude, MSW, LCSW Office: [phone] Cell: [phone] Fax: [phone]

## 2023-02-16 NOTE — Progress Notes (Signed)
 Occupational Therapy Session Note  Patient Details  Name: Jennifer [name] MRN: [id number] Date of Birth: Oct 19, 1980  Today's Date: 02/16/2023 OT Individual Time: 1105-1200 OT Individual Time Calculation (min): 55 min    Short Term Goals: Week 1:  OT Short Term Goal 1 (Week 1): STG = LTG due to ELOS  Skilled Therapeutic Interventions/Progress Updates:  Skilled OT intervention completed with focus on pain management, RLE strengthening. Pt received seated in w/c, agreeable to session. 10/10 phantom pain reported in Rt residual limb; pre-medicated. OT offered rest breaks and repositioning throughout for pain reduction.  Pt voiced that her pain started Saturday and has been consistent, like a sharp muscle spasm since with minimal relief. Pt reported that current AKA shrinker is comfortable, however OT did contact [employer] about alternative shrinker with silicone band vs waist band strap to avoid skin breakdown over her c-section area which was reported to be red from wearing the waist strap over the weekend.  Pt self-propelled in w/c about 100 ft with mod I, then transported rest of way to gym for time. Completed close supervision sit > stand and CGA short ambulatory transfer with RW > EOM about 7 ft. Pt transitioned to long sitting with increased time but mod I. Utilized long mirror for mirror therapy to address current pain. Discussed technique, importance of looking at the reflected leg. Pt completed for several mins, with cues to attend to reflection of leg, however with minimal relief.  OT completed gentle massage to Rt residual limb at hip and medial thigh. Pt tolerated mild to moderate pressure with pt reporting an improvement in the tension felt. Education provided on the gentle pressure pt can apply herself, as well as breathing techniques as when spasms occur, pt with full body tension and breath holding. Pt able to utilize during session during several instances with some relief.  Pt unable  to tolerate full supine position due to claustrophobia but able to achieve propped on elbows position for long sitting for passive stretch of Rt hip flexor. Pt completed x10 hip abduction <> adduction slides without assist but + time needed for weakness and pain. Then with assist for off loading residual limb, pt completed x3 hip flexion with increase in ROM as reps progressed however increase in pain therefore terminated activity.   Transitioned back to full long sitting with min A for trunk elevation, then supervision to EOM. CGA sit > stand and ambulatory transfer with RW > w/c. Transported dependently in w/c > room, then similar transfer > EOB. Pt remained seated EOB with direct care handoff to NT for CBG check.   Therapy Documentation Precautions:  Precautions Precautions: Fall Restrictions Weight Bearing Restrictions Per Provider Order: Yes RLE Weight Bearing Per Provider Order: Non weight bearing    Therapy/Group: Individual Therapy  Lorrayne FORBES Fritter, MS, OTR/L  02/16/2023, 12:09 PM

## 2023-02-16 NOTE — Progress Notes (Signed)
 Physical Therapy Session Note  Patient Details  Name: Jennifer [name] MRN: [id number] Date of Birth: 08-10-1980  Today's Date: 02/16/2023 PT Individual Time: 8698-8643 PT Individual Time Calculation (min): 55 min   Short Term Goals: Week 1:  PT Short Term Goal 1 (Week 1): STG=LTG due to LOS  Skilled Therapeutic Interventions/Progress Updates:   Received pt sitting EOB, pt agreeable to PT treatment, and reported pain '20/10 in R residual limb (premedicated). Session with emphasis on functional mobility/transfers, generalized strengthening and endurance, and gait training. Pt transferred bed<>WC stand<>pivot with RW and close supervision and performed WC mobility 273ft using BUE and supervision/mod I - limited by fatigue and transported remainder of way to dayroom in WC dependently. Stood with RW and CGA and ambulated 20ft with RW and CGA to Nustep. Pt performed seated BUE and LLE strengthening on Nustep at workload 4 for 8 minutes for a total of 317 steps with emphasis on cardiovascular endurance and LE strengthening. Pt required frequent rest/water breaks throughout due to increased pain. Pt reported arms and leg felt like jelly afterwards and unable to ambulate back to WC.   Returned to room, transferred WC<>bed via stand<>pivot with RW and supervision and transitioned into semi-reclined mod I. Pt performed SLR x10 on R residual limb, then reported urge to void. Transferred to EOB mod I and [facility] from [facility] arrived with new shrinker. Pt transferred to bedside commode via stand<>pivot with RW and close supervision and pt able to manage clothing while standing with CGA/close supervision and void/perform hygiene management without assist. Pt requested additional time on commode and left sitting on bedside commode left in care of NT.   Therapy Documentation Precautions:  Precautions Precautions: Fall Restrictions Weight Bearing Restrictions Per Provider Order: (P) Yes RLE Weight Bearing Per  Provider Order: Non weight bearing  Therapy/Group: Individual Therapy [name] Therisa Stains PT, DPT 02/16/2023, 7:13 AM

## 2023-02-16 NOTE — Discharge Instructions (Addendum)
 Inpatient Rehab Discharge Instructions  Jennifer [name] Discharge date and time: 02/19/23   Activities/Precautions/ Functional Status: Activity: no lifting, driving, or strenuous exercise till cleared by MD Diet: diabetic diet Wound Care: keep wound clean and dry   Functional status:  ___ No restrictions      ___ Walk up steps independently ___ 24/7 supervision/assistance   ___ Walk up steps with assistance _X__ Intermittent supervision/assistance  ___ Bathe/dress independently ___ Walk with walker     ___ Bathe/dress with assistance ___ Walk Independently     ___ Shower independently ___ Walk with assistance     _X__ Shower with assistance _X__ No alcohol      ___ Return to work/school ________  Special Instructions: Monitor blood sugar 2-4 times a day (before meals and/or at bedtime).     COMMUNITY REFERRALS UPON DISCHARGE:    *All therapies are deferred until prosthesis.Please use home exercise program provided by therapy team. *  Medical Equipment/Items Ordered:rolling walker, wheelchair, tub transfer bench, shower stool, 3in1 bedside commode                                                 Agency/Supplier: [employer] [phone]  GENERAL COMMUNITY RESOURCES FOR PATIENT/FAMILY: A personal care services [facility]) referral was submitted to your insurance. To check the status be sure to call [name] #[phone] to check status of referral.   My questions have been answered and I understand these instructions. I will adhere to these goals and the provided educational materials after my discharge from the hospital.  Patient/Caregiver Signature _______________________________ Date __________  Clinician Signature _______________________________________ Date __________  Please bring this form and your medication list with you to all your follow-up doctor's appointments. Information on my medicine - ELIQUIS  (apixaban )  This medication education was reviewed with me or  my healthcare representative as part of my discharge preparation.  Why was Eliquis  prescribed for you? Eliquis  was prescribed for you to reduce the risk of a blood clot forming that can cause a stroke if you have a medical condition called atrial fibrillation (a type of irregular heartbeat).  What do You need to know about Eliquis  ? Take your Eliquis  TWICE DAILY - one tablet in the morning and one tablet in the evening with or without food. If you have difficulty swallowing the tablet whole please discuss with your pharmacist how to take the medication safely.  Take Eliquis  exactly as prescribed by your doctor and DO NOT stop taking Eliquis  without talking to the doctor who prescribed the medication.  Stopping may increase your risk of developing a stroke.  Refill your prescription before you run out.  After discharge, you should have regular check-up appointments with your healthcare provider that is prescribing your Eliquis .  In the future your dose may need to be changed if your kidney function or weight changes by a significant amount or as you get older.  What do you do if you miss a dose? If you miss a dose, take it as soon as you remember on the same day and resume taking twice daily.  Do not take more than one dose of ELIQUIS  at the same time to make up a missed dose.  Important Safety Information A possible side effect of Eliquis  is bleeding. You should call your healthcare provider right away if you experience any of the  following: Bleeding from an injury or your nose that does not stop. Unusual colored urine (red or dark brown) or unusual colored stools (red or black). Unusual bruising for unknown reasons. A serious fall or if you hit your head (even if there is no bleeding).  Some medicines may interact with Eliquis  and might increase your risk of bleeding or clotting while on Eliquis . To help avoid this, consult your healthcare provider or pharmacist prior to using any  new prescription or non-prescription medications, including herbals, vitamins, non-steroidal anti-inflammatory drugs (NSAIDs) and supplements.  This website has more information on Eliquis  (apixaban ): http://[email or web address] .com/eliquis dena

## 2023-02-16 NOTE — Care Management (Signed)
 Inpatient Rehabilitation Center Individual Statement of Services  Patient Name:  Jennifer [name]  Date:  02/16/2023  Welcome to the Inpatient Rehabilitation Center.  Our goal is to provide you with an individualized program based on your diagnosis and situation, designed to meet your specific needs.  With this comprehensive rehabilitation program, you will be expected to participate in at least 3 hours of rehabilitation therapies Monday-Friday, with modified therapy programming on the weekends.  Your rehabilitation program will include the following services:  Physical Therapy (PT), Occupational Therapy (OT), Speech Therapy (ST), Therapeutic Recreaction (TR), Psychology, Neuropsychology, Care Coordinator, Rehabilitation Medicine, Nutrition Services, Pharmacy Services, and Other  Weekly team conferences will be held on Wednesdays to discuss your progress.  Your Inpatient Rehabilitation Care Coordinator will talk with you frequently to get your input and to update you on team discussions.  Team conferences with you and your family in attendance may also be held.  Expected length of stay: 7 days    Overall anticipated outcome: Independent with Assistive Device   Depending on your progress and recovery, your program may change. Your Inpatient Rehabilitation Care Coordinator will coordinate services and will keep you informed of any changes. Your Inpatient Rehabilitation Care Coordinator's name and contact numbers are listed  below.  The following services may also be recommended but are not provided by the Inpatient Rehabilitation Center:  Driving Evaluations Home Health Rehabiltiation Services Outpatient Rehabilitation Services Vocational Rehabilitation   Arrangements will be made to provide these services after discharge if needed.  Arrangements include referral to agencies that provide these services.  Your insurance has been verified to be:  [place] [employer]  Your primary doctor  is:  Carlin Blamer [facility]  Pertinent information will be shared with your doctor and your insurance company.  Inpatient Rehabilitation Care Coordinator:  Graeme Feliciana SILK [phone] or [phone]  Information discussed with and copy given to patient by: Graeme DELENA Feliciana, 02/16/2023, 10:24 AM

## 2023-02-17 ENCOUNTER — Ambulatory Visit (INDEPENDENT_AMBULATORY_CARE_PROVIDER_SITE_OTHER): Payer: Medicaid Other | Admitting: Nurse Practitioner

## 2023-02-17 LAB — GLUCOSE, CAPILLARY
Glucose-Capillary: 89 mg/dL (ref 70–99)
Glucose-Capillary: 100 mg/dL — ABNORMAL HIGH (ref 70–99)
Glucose-Capillary: 116 mg/dL — ABNORMAL HIGH (ref 70–99)
Glucose-Capillary: 123 mg/dL — ABNORMAL HIGH (ref 70–99)

## 2023-02-17 MED ORDER — AMITRIPTYLINE HCL 10 MG PO TABS
10.0000 mg | ORAL_TABLET | Freq: Every day | ORAL | Status: DC
  Administered 2023-02-17: 10 mg via ORAL
  Filled 2023-02-17: qty 1

## 2023-02-17 MED ORDER — MORPHINE SULFATE 15 MG PO TABS
15.0000 mg | ORAL_TABLET | ORAL | Status: DC | PRN
  Administered 2023-02-17 – 2023-02-19 (×6): 15 mg via ORAL
  Filled 2023-02-17 (×6): qty 1

## 2023-02-17 MED ORDER — INSULIN GLARGINE-YFGN 100 UNIT/ML ~~LOC~~ SOLN
18.0000 [IU] | Freq: Two times a day (BID) | SUBCUTANEOUS | Status: DC
  Administered 2023-02-17 – 2023-02-18 (×3): 18 [IU] via SUBCUTANEOUS
  Filled 2023-02-17 (×4): qty 0.18

## 2023-02-17 NOTE — Progress Notes (Signed)
 Occupational Therapy Session Note  Patient Details  Name: Jennifer [name] MRN: [id number] Date of Birth: Mar 19, 1980  Today's Date: 02/17/2023 OT Individual Time: 1300-1345 OT Individual Time Calculation (min): 45 min    Short Term Goals: Week 1:  OT Short Term Goal 1 (Week 1): STG = LTG due to ELOS  Skilled Therapeutic Interventions/Progress Updates:  Pt greeted resting in bed for skilled OT session with focus on residual limb care, functional transfers, and general conditioning.   Pain: Pt reported 50/10 pain, pre-medicated. OT offering intermediate rest breaks and positioning suggestions throughout session to address pain/fatigue and maximize participation/safety in session. Introduced cold feature on K-pad.   Functional Transfers: Bed mobility with supervision, stand-pivot from EOB>WC<>EOM with CGA fading to close supervision + no AD.   Therapeutic Exercise: Pt performs 1x10 reps of modified sit-ups, diagonal reaches, and torso twists for core strengthening.    Self Care Tasks: Pt doffs AKA (grey) shinker with supervision, tube used don AKA sleeve (dark grey) with Mod A. Pt tolerating well at end of session.   Pt remained sitting in WC with 4Ps assessed and immediate needs met. Pt continues to be appropriate for skilled OT intervention to promote further functional independence in ADLs/IADLs.   Therapy Documentation Precautions:  Precautions Precautions: Fall Restrictions Weight Bearing Restrictions Per Provider Order: Yes RLE Weight Bearing Per Provider Order: Non weight bearing   Therapy/Group: Individual Therapy  Nereida Habermann, OTR/L, MSOT  02/17/2023, 6:20 AM

## 2023-02-17 NOTE — Progress Notes (Signed)
 Physical Therapy Session Note  Patient Details  Name: Jennifer [name] MRN: [id number] Date of Birth: 04-21-1980  Today's Date: 02/17/2023 PT Individual Time: 9154-9044 PT Individual Time Calculation (min): 70 min   Short Term Goals: Week 1:  PT Short Term Goal 1 (Week 1): STG=LTG due to LOS  Skilled Therapeutic Interventions/Progress Updates:   Received pt semi-reclined in bed, pt agreeable to PT treatment, and reported pain 9/10 in R residual limb (premedicated) - provided frequent rest breaks to reduce pain. Session with emphasis on functional mobility/transfers, DME for discharge, donning/doffing shrinker, generalized strengthening and endurance, and ambulation. Stood from EOB with RW and supervision and ambulated 45ft with RW and supervision to WC.   Removed grey shrinker and attempted to don back shrinker in sitting using donning tube, however unable to get high enough on thigh and recommended pt return to bed - noted skin breakdown at R medial thigh. Stand<>pivot WC<>bed without AD and CGA and sit<>supine mod I. Pt unable to tolerate wearing back shrinker reporting increased muslce spasms in residual limb. Removed belt from grey shrinker (due to rubbing on pt's c section scar) and donned grey shrinker using donning tube with increased time and rest breaks due to pain. Placed foam dressing along medial thigh to prevent shrinker from rubbing on sore skin. Discussed equipment for discharge - pt will need 18x18 manual WC and RW. Pt requesting to order RW and pay OOP for convienience - CSW notified. Contacted Scott from [employer] regarding making large shrinker donning tube for pt. Discussed expectations for family education tomorrow and pt hopeful to discharge Thursday. Concluded session with pt semi-reclined in bed, needs within reach, and bed alarm on.   Therapy Documentation Precautions:  Precautions Precautions: Fall Restrictions Weight Bearing Restrictions Per Provider Order: Yes RLE Weight  Bearing Per Provider Order: Non weight bearing  Therapy/Group: Individual Therapy [name] Therisa Stains PT, DPT 02/17/2023, 6:52 AM

## 2023-02-17 NOTE — Progress Notes (Signed)
 Patient ID: Jennifer [name], female   DOB: 1980-03-26, 43 y.o.   MRN: [id number]  SW faxed PCS referral again to [facility] Case Management Dept [phone].  Graeme Jude, MSW, LCSW Office: [phone] Cell: [phone] Fax: [phone]

## 2023-02-17 NOTE — Progress Notes (Signed)
 Occupational Therapy Session Note  Patient Details  Name: Jennifer [name] MRN: [id number] Date of Birth: 1980-05-28  Today's Date: 02/17/2023 OT Individual Time: 1415-1520 OT Individual Time Calculation (min): 65 min  and Today's Date: 02/17/2023 OT Missed Time: 10 Minutes Missed Time Reason: Patient fatigue   Short Term Goals: Week 1:  OT Short Term Goal 1 (Week 1): STG = LTG due to ELOS  Skilled Therapeutic Interventions/Progress Updates:  Skilled OT intervention completed with focus on DC planning, functional standing tolerance, dynamic balance. Pt received seated in w/c, agreeable to session. 9/10 pain reported in Rt residual limb; nurse present at start of session for meds. OT offered rest breaks, repositioning, and distraction throughout for pain reduction.  Current DME in room including TTB; pt did confirm she has tub/shower in her new home and this will still be applicable for her new place of DC. Verbally reviewed how to install the TTB and transfer in/out of shower. Reviewed that while sutures are in place, pt is to cover the residual limb with waterproof cover to prevent infection. Also discussed receiving smaller AKA shrinkers at her post-op appt as she current has on black AKA shrinker and prefers this to the grey one with the waist band but will need to downsize as she shapes the residual limb.  Pt self-propelled in w/c <> gym with mod I. Completed all sit > stands using personal RW (adjusted for her height) with supervision. Pt participated in the following activities in standing to address dynamic balance and practicing releasing unilateral UE from RW for BADLs: -Motor speed dot test- 3.60 sec, min difficulty with peripheral reaching specidically on Rt side when having to release RUE from RW due to Rt leg no longer stabilizing her on that side  -Bell cancellation test- 3 min; 6 misses on peripheral fields -Trail Making B- unable to complete in appropriate amount of time,  requiring max A for problem solving and task completion even in seated position. Pt with increased drowsiness and verbalized fatigue.   Pt completed the following intervals while seated at the SCIFIT to promote global/BUE endurance needed for independence with BADLs and functional transfers: -3 min, level 4, forwards  Pt with increased lethargy, drifting in/out of sleep, requesting to return to bed. Back in room, pt completed stand pivot > EOB, then mod I bed mobility to upright in bed. Retrieved coffee per pt request. Pt remained upright in bed, with bed alarm on/activated, and with all needs in reach at end of session. Pt missed 10 mins of OT intervention secondary to increased lethargy/fatigue; OT will make up missed time as able.    Therapy Documentation Precautions:  Precautions Precautions: Fall Restrictions Weight Bearing Restrictions Per Provider Order: Yes RLE Weight Bearing Per Provider Order: Non weight bearing    Therapy/Group: Individual Therapy  Lorrayne FORBES Fritter, MS, OTR/L  02/17/2023, 3:29 PM

## 2023-02-17 NOTE — Progress Notes (Signed)
 Discussed trial of MSIR  as is tolerating MS contin and may provide more consistent relief. Patient agreeable.

## 2023-02-17 NOTE — Progress Notes (Signed)
 PROGRESS NOTE   Subjective/Complaints: Continues to have severe phantom limb pain and is sleeping poorly at night as a result. Discussed trial of amitriptyline  at night and she is agreeable  ROS: +pain to residual limb, phantom pain- worsened, +insomnia Per HPI, denies CP, SOB, abd pain, n/v/d/c  Objective:   No results found. Recent Labs    02/16/23 0602  WBC 7.7  HGB 7.6*  HCT 25.8*  PLT 408*   Recent Labs    02/16/23 0602  NA 135  K 4.4  CL 99  CO2 27  GLUCOSE 127*  BUN 19  CREATININE 1.55*  CALCIUM  9.4    Intake/Output Summary (Last 24 hours) at 02/17/2023 1016 Last data filed at 02/17/2023 0743 Gross per 24 hour  Intake 700 ml  Output --  Net 700 ml        Physical Exam: Vital Signs Blood pressure 116/76, pulse 86, temperature 98.6 F (37 C), resp. rate 17, height 5' 6 (1.676 m), weight 77.8 kg, SpO2 98%.  Gen: no distress, normal appearing, sitting up at EOB, rubbing AKA stump.  HEENT: oral mucosa pink and moist, NCAT Cardio: Reg rate and rhythm, no m/r/g appreciated Chest: normal effort, normal rate of breathing, CTAB Abd: soft, non-distended, nonTTP, +BS throughout Ext: no edema in LLE, RLE AKA, shrinker in place Psych: pleasant, normal affect Skin: R AKA incisions not assessed today Psych: Flat but appropriate.  Neuro: AAOx4. No apparent cognitive deficits + Phantom sensations in RLE Sensation intact. Strength 0/5 RLE, otherwise 5/5. No apparent cognitive deficits. CN 2-12 intact. Reflexes 2+. Stable 1/14, incision without drainage  Musculoskeletal:      + R-AKA incisions C/D/I with staples and sutures in place.  AROM RLE HF limited due to pain.    Skin: Skin is warm and dry. Small amount of serousanguinous drainage form surgical site.     Assessment/Plan: 1. Functional deficits which require 3+ hours per day of interdisciplinary therapy in a comprehensive inpatient rehab  setting. Physiatrist is providing close team supervision and 24 hour management of active medical problems listed below. Physiatrist and rehab team continue to assess barriers to discharge/monitor patient progress toward functional and medical goals  Care Tool:  Bathing    Body parts bathed by patient: Right arm, Left arm, Chest, Abdomen, Front perineal area, Buttocks, Right upper leg, Left upper leg, Face   Body parts bathed by helper: Left lower leg Body parts n/a: Right lower leg   Bathing assist Assist Level: Minimal Assistance - Patient > 75%     Upper Body Dressing/Undressing Upper body dressing   What is the patient wearing?: Pull over shirt    Upper body assist Assist Level: Set up assist    Lower Body Dressing/Undressing Lower body dressing      What is the patient wearing?: Pants, Underwear/pull up, Ace wrap/stump shrinker     Lower body assist Assist for lower body dressing: Maximal Assistance - Patient 25 - 49%     Toileting Toileting    Toileting assist Assist for toileting: Moderate Assistance - Patient 50 - 74%     Transfers Chair/bed transfer  Transfers assist     Chair/bed transfer assist  level: Supervision/Verbal cueing     Locomotion Ambulation   Ambulation assist      Assist level: Contact Guard/Touching assist Assistive device: Walker-rolling Max distance: 38ft   Walk 10 feet activity   Assist     Assist level: Contact Guard/Touching assist Assistive device: Walker-rolling   Walk 50 feet activity   Assist Walk 50 feet with 2 turns activity did not occur: Safety/medical concerns (fatigue, pain)         Walk 150 feet activity   Assist Walk 150 feet activity did not occur: Safety/medical concerns (fatigue, pain)         Walk 10 feet on uneven surface  activity   Assist Walk 10 feet on uneven surfaces activity did not occur: Safety/medical concerns (fatigue, pain)         Wheelchair     Assist Is the  patient using a wheelchair?: Yes Type of Wheelchair: Manual    Wheelchair assist level: Supervision/Verbal cueing Max wheelchair distance: 9ft    Wheelchair 50 feet with 2 turns activity    Assist        Assist Level: Supervision/Verbal cueing   Wheelchair 150 feet activity     Assist      Assist Level: Moderate Assistance - Patient 50 - 74%   Blood pressure 116/76, pulse 86, temperature 98.6 F (37 C), resp. rate 17, height 5' 6 (1.676 m), weight 77.8 kg, SpO2 98%.  Medical Problem List and Plan: 1. 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  Antithrombotics: -continue Eliquis  5mg  BID             -antiplatelet therapy: N/A 3. Pain Management:  Continue MS contin  30mg  BID--change IV dilaudid  to po prn. Increase cymbalta  to 30mg  daily             --Gabapentin  400mg  QID for neuropathy and encourage desensitization             --Flexeril  prn for muscle spasms. - Discussed wean down on pain medication with patient prior to IRF admission; she endorses 5/10 pain if asking for PRNs -02/15/23 pt having a hard time with phantom pain, reports previously was on Gabapentin  800mg  QID but I can't find this-- I see 600mg  TID in prior H&Ps. For now, will increase to 600mg  QID from 400mg , and see if this makes a difference-- weekday team might need to reach out to her pain doctor to see what she was actually taking previously.  -Encouraged pt to space out narcotics to avoid side effects and have less time between a dose and improve pain control Amitriptyline  10mg  ordered HS   4. Insomnia: amitriptyline  10mg  ordered HS --has trazodone  100mg  at bedtime for insomnia but having difficulty sleeping due to adjustment issues.   -02/14/23 slept ok, cont regimen              -antipsychotic agents: N/A   5. Neuropsych/cognition: This patient is capable of making decisions on her own behalf.   6. Skin/Wound Care: Monitor wound for healing. Add vitamin C  to promote healing.               -  Encourage wrapping/compression for edema    7. Fluids/Electrolytes/Nutrition: Monitor I/O. Encourage fluid intake   8. T1DM: Hgb A1C-9.2. Was on 70/30 45 units TID AC prior to admission  Provided list of foods for good diabetes control  D/c ISS and decrease Semglee  to 19U BID  CBG (last 3)  Recent Labs    02/16/23 1628 02/16/23 2112 02/17/23 0615  GLUCAP 80 111* 89      9. A fib: Monitor HR TID--continue Cardizem  180mg  daily and Eliquis .  10. OSA: Encourage CPAP use.  11. Hyponatremia: Likely related to poor intake which is up to 60% in past 24 hrs.              -02/13/23 132, fairly stable, monitor as indicated 12. Acute on chronic renal failure?: BUN/SCr 14/1.33 at admission-->BUN/SCr 24/1.4             --encourage fluid intake.  13. Iron  deficiency/Anemia of chronic disease: Baseline Hgb is around 8 due to hx of menorrhagia/Fibroids               --continue Iron  supplement  14. Depression: increase cymbalta  to 30mg  daily  15. Overweight: provided dietary education.   16. Constipation: Continue miralax  daily, senokot 2 tabs nightly     LOS: 5 days A FACE TO FACE EVALUATION WAS PERFORMED  Jennifer [name] 02/17/2023, 10:16 AM

## 2023-02-18 ENCOUNTER — Other Ambulatory Visit (HOSPITAL_COMMUNITY): Payer: Self-pay

## 2023-02-18 LAB — GLUCOSE, CAPILLARY
Glucose-Capillary: 104 mg/dL — ABNORMAL HIGH (ref 70–99)
Glucose-Capillary: 96 mg/dL (ref 70–99)
Glucose-Capillary: 93 mg/dL (ref 70–99)
Glucose-Capillary: 121 mg/dL — ABNORMAL HIGH (ref 70–99)

## 2023-02-18 MED ORDER — APIXABAN 5 MG PO TABS
5.0000 mg | ORAL_TABLET | Freq: Two times a day (BID) | ORAL | 0 refills | Status: AC
  Filled 2023-02-18: qty 60, 30d supply, fill #0

## 2023-02-18 MED ORDER — INSULIN GLARGINE-YFGN 100 UNIT/ML ~~LOC~~ SOLN
17.0000 [IU] | Freq: Two times a day (BID) | SUBCUTANEOUS | Status: DC
  Administered 2023-02-18 – 2023-02-19 (×2): 17 [IU] via SUBCUTANEOUS
  Filled 2023-02-18 (×3): qty 0.17

## 2023-02-18 MED ORDER — MORPHINE SULFATE 15 MG PO TABS
15.0000 mg | ORAL_TABLET | ORAL | 0 refills | Status: AC | PRN
  Filled 2023-02-18: qty 30, 5d supply, fill #0

## 2023-02-18 MED ORDER — MORPHINE SULFATE ER 30 MG PO TBCR
30.0000 mg | EXTENDED_RELEASE_TABLET | Freq: Two times a day (BID) | ORAL | 0 refills | Status: AC
  Filled 2023-02-18: qty 14, 7d supply, fill #0

## 2023-02-18 MED ORDER — BACLOFEN 5 MG HALF TABLET: 5.0000 mg | ORAL_TABLET | Freq: Three times a day (TID) | ORAL | Status: DC | PRN

## 2023-02-18 MED ORDER — HYDROCERIN EX CREA
1.0000 | TOPICAL_CREAM | Freq: Two times a day (BID) | CUTANEOUS | 0 refills | Status: AC
  Filled 2023-02-18: qty 454, 57d supply, fill #0

## 2023-02-18 MED ORDER — GABAPENTIN 400 MG PO CAPS
400.0000 mg | ORAL_CAPSULE | Freq: Three times a day (TID) | ORAL | 0 refills | Status: AC
  Filled 2023-02-18: qty 90, 30d supply, fill #0

## 2023-02-18 MED ORDER — DILTIAZEM HCL ER COATED BEADS 180 MG PO CP24
180.0000 mg | ORAL_CAPSULE | Freq: Every day | ORAL | 0 refills | Status: AC
  Filled 2023-02-18: qty 30, 30d supply, fill #0

## 2023-02-18 MED ORDER — AMITRIPTYLINE HCL 25 MG PO TABS
25.0000 mg | ORAL_TABLET | Freq: Every day | ORAL | 0 refills | Status: AC
  Filled 2023-02-18: qty 30, 30d supply, fill #0

## 2023-02-18 MED ORDER — ASCORBIC ACID 500 MG PO TABS
500.0000 mg | ORAL_TABLET | Freq: Every day | ORAL | 0 refills | Status: AC
  Filled 2023-02-18: qty 30, 30d supply, fill #0

## 2023-02-18 MED ORDER — POLYETHYLENE GLYCOL 3350 17 GM/SCOOP PO POWD
17.0000 g | Freq: Every day | ORAL | 0 refills | Status: AC | PRN
  Filled 2023-02-18: qty 238, 14d supply, fill #0

## 2023-02-18 MED ORDER — PANTOPRAZOLE SODIUM 40 MG PO TBEC
40.0000 mg | DELAYED_RELEASE_TABLET | Freq: Every day | ORAL | 0 refills | Status: AC
  Filled 2023-02-18: qty 30, 30d supply, fill #0

## 2023-02-18 MED ORDER — AMITRIPTYLINE HCL 25 MG PO TABS
25.0000 mg | ORAL_TABLET | Freq: Every day | ORAL | Status: DC
Start: 2023-02-18 — End: 2023-02-19
  Administered 2023-02-18: 25 mg via ORAL
  Filled 2023-02-18 (×2): qty 1

## 2023-02-18 MED ORDER — GABAPENTIN 400 MG PO CAPS
400.0000 mg | ORAL_CAPSULE | Freq: Four times a day (QID) | ORAL | 0 refills | Status: DC
  Filled 2023-02-18: qty 120, 30d supply, fill #0

## 2023-02-18 MED ORDER — POLYSACCHARIDE IRON COMPLEX 150 MG PO CAPS
150.0000 mg | ORAL_CAPSULE | Freq: Two times a day (BID) | ORAL | 0 refills | Status: AC
  Filled 2023-02-18: qty 60, 30d supply, fill #0

## 2023-02-18 MED ORDER — MAGNESIUM 250 MG PO TABS
250.0000 mg | ORAL_TABLET | Freq: Every day | ORAL | 0 refills | Status: AC
  Filled 2023-02-18: qty 15, 30d supply, fill #0

## 2023-02-18 MED ORDER — TRAZODONE HCL 100 MG PO TABS
100.0000 mg | ORAL_TABLET | Freq: Every day | ORAL | 0 refills | Status: AC
  Filled 2023-02-18: qty 30, 30d supply, fill #0

## 2023-02-18 MED ORDER — TRAMADOL HCL 50 MG PO TABS
50.0000 mg | ORAL_TABLET | Freq: Four times a day (QID) | ORAL | 0 refills | Status: AC | PRN
  Filled 2023-02-18: qty 28, 7d supply, fill #0

## 2023-02-18 MED ORDER — INSULIN PEN NEEDLE 32G X 4 MM MISC
1.0000 | Freq: Two times a day (BID) | 0 refills | Status: AC
  Filled 2023-02-18: qty 100, 30d supply, fill #0

## 2023-02-18 MED ORDER — DULOXETINE HCL 30 MG PO CPEP
30.0000 mg | ORAL_CAPSULE | Freq: Every day | ORAL | 0 refills | Status: AC
  Filled 2023-02-18: qty 30, 30d supply, fill #0

## 2023-02-18 MED ORDER — SENNA 8.6 MG PO TABS
2.0000 | ORAL_TABLET | Freq: Every day | ORAL | 0 refills | Status: AC
  Filled 2023-02-18: qty 60, 30d supply, fill #0

## 2023-02-18 MED ORDER — LANTUS SOLOSTAR 100 UNIT/ML ~~LOC~~ SOPN
17.0000 [IU] | PEN_INJECTOR | Freq: Two times a day (BID) | SUBCUTANEOUS | 11 refills | Status: AC
  Filled 2023-02-18: qty 15, 44d supply, fill #0

## 2023-02-18 MED ORDER — CYCLOBENZAPRINE HCL 10 MG PO TABS
10.0000 mg | ORAL_TABLET | Freq: Three times a day (TID) | ORAL | 0 refills | Status: AC
  Filled 2023-02-18: qty 90, 30d supply, fill #0

## 2023-02-18 NOTE — Plan of Care (Signed)
  Problem: RH Pre-functional/Other (Specify) Goal: RH LTG OT (Specify) 1 Description: RH LTG OT (Specify) 1 Outcome: Not Met (add Reason) Flowsheets (Taken 02/18/2023 1210) LTG: Other OT (Specify) 1: (not met due to pain, requiring supervision/cues for technique) --   Problem: RH Balance Goal: LTG Patient will maintain dynamic standing with ADLs (OT) Description: LTG:  Patient will maintain dynamic standing balance with assist during activities of daily living (OT)  Outcome: Completed/Met   Problem: Sit to Stand Goal: LTG:  Patient will perform sit to stand in prep for activites of daily living with assistance level (OT) Description: LTG:  Patient will perform sit to stand in prep for activites of daily living with assistance level (OT) Outcome: Completed/Met   Problem: RH Bathing Goal: LTG Patient will bathe all body parts with assist levels (OT) Description: LTG: Patient will bathe all body parts with assist levels (OT) Outcome: Completed/Met   Problem: RH Dressing Goal: LTG Patient will perform lower body dressing w/assist (OT) Description: LTG: Patient will perform lower body dressing with assist, with/without cues in positioning using equipment (OT) Outcome: Completed/Met   Problem: RH Toileting Goal: LTG Patient will perform toileting task (3/3 steps) with assistance level (OT) Description: LTG: Patient will perform toileting task (3/3 steps) with assistance level (OT)  Outcome: Completed/Met   Problem: RH Toilet Transfers Goal: LTG Patient will perform toilet transfers w/assist (OT) Description: LTG: Patient will perform toilet transfers with assist, with/without cues using equipment (OT) Outcome: Completed/Met   Problem: RH Tub/Shower Transfers Goal: LTG Patient will perform tub/shower transfers w/assist (OT) Description: LTG: Patient will perform tub/shower transfers with assist, with/without cues using equipment (OT) Outcome: Completed/Met

## 2023-02-18 NOTE — Progress Notes (Signed)
 Occupational Therapy Discharge Summary  Patient Details  Name: Jennifer [name] MRN: [id number] Date of Birth: 02-Jun-1980  Date of Discharge from OT service:February 18, 2023   Patient has met 7 of 8 long term goals due to improved activity tolerance, improved balance, and improved coordination.  Patient to discharge at overall Modified Independent level.  Patient's care partner is independent to provide the necessary physical assistance at discharge.    Reasons goals not met: Donning of shrinker not met at mod I level due to pain, requiring supervision/cues for technique.  Recommendation:  No follow up recommended  Equipment: TTB  & BSC  Reasons for discharge: treatment goals met  Patient/family agrees with progress made and goals achieved: Yes  OT Discharge Precautions/Restrictions  Precautions Precautions: Fall Restrictions Weight Bearing Restrictions Per Provider Order: Yes RLE Weight Bearing Per Provider Order: Non weight bearing ADL ADL Eating: Independent Where Assessed-Eating: Edge of bed Grooming: Independent Where Assessed-Grooming: Sitting at sink Upper Body Bathing: Independent Where Assessed-Upper Body Bathing: Shower Lower Body Bathing: Modified independent Where Assessed-Lower Body Bathing: Shower Upper Body Dressing: Independent Where Assessed-Upper Body Dressing: Edge of bed Lower Body Dressing: Modified independent Where Assessed-Lower Body Dressing: Sitting at sink, Standing at sink Toileting: Modified independent Where Assessed-Toileting: Bedside Commode Toilet Transfer: Modified independent Toilet Transfer Method: Surveyor, minerals: Extra wide bedside commode, Other (comment) (RW) Tub/Shower Transfer: Modified independent Tub/Shower Transfer Method: Engineer, technical sales: Insurance underwriter: Close supervision Film/video editor Method: Designer, industrial/product: Administrator, Grab bars Vision Baseline Vision/History: 1 Wears glasses Patient Visual Report: No change from baseline Vision Assessment?: No apparent visual deficits Perception  Perception: Within Functional Limits Praxis Praxis: WFL Cognition Cognition Overall Cognitive Status: Within Functional Limits for tasks assessed Arousal/Alertness: Awake/alert Orientation Level: Person;Place;Situation Person: Oriented Place: Oriented Situation: Oriented Memory: Appears intact Awareness: Appears intact Problem Solving: Appears intact Safety/Judgment: Appears intact Brief Interview for Mental Status (BIMS) Repetition of Three Words (First Attempt): 3 Temporal Orientation: Year: Correct Temporal Orientation: Month: Accurate within 5 days Temporal Orientation: Day: Correct Recall: "Sock": Yes, no cue required Recall: "Blue": Yes, no cue required Recall: "Bed": Yes, no cue required BIMS Summary Score: 15 Sensation Sensation Light Touch: Impaired Detail Light Touch Impaired Details: Impaired RLE Hot/Cold: Not tested Proprioception: Impaired by gross assessment Stereognosis: Not tested Additional Comments: phantom pain in R residual limb, otherwise [facility] Coordination Gross Motor Movements are Fluid and Coordinated: No Fine Motor Movements are Fluid and Coordinated: Yes Coordination and Movement Description: altered balance strategies due to R AKA Finger Nose Finger Test: [facility] bilaterally Heel Shin Test: unable to perform due to R AKA Motor  Motor Motor: Abnormal postural alignment and control Motor - Skilled Clinical Observations: altered balance strategies due to R AKA Mobility  Bed Mobility Bed Mobility: Rolling Right;Rolling Left;Sit to Supine;Supine to Sit Rolling Right: Independent Rolling Left: Independent Supine to Sit: Independent Sit to Supine: Independent Transfers Sit to Stand: Independent with assistive device Stand to Sit: Independent with assistive device   Trunk/Postural Assessment  Cervical Assessment Cervical Assessment: Within Functional Limits Thoracic Assessment Thoracic Assessment: Within Functional Limits Lumbar Assessment Lumbar Assessment: Within Functional Limits Postural Control Postural Control: Deficits on evaluation Righting Reactions: delayed on R due to AKA Protective Responses: delayed on R due to AKA  Balance Balance Balance Assessed: Yes Static Sitting Balance Static Sitting - Balance Support: Feet supported;Bilateral upper extremity supported Static Sitting - Level of Assistance: 7: Independent Dynamic Sitting  Balance Dynamic Sitting - Balance Support: Feet supported;No upper extremity supported Dynamic Sitting - Level of Assistance: 6: Modified independent (Device/Increase time) Static Standing Balance Static Standing - Balance Support: Bilateral upper extremity supported;During functional activity (RW) Static Standing - Level of Assistance: 6: Modified independent (Device/Increase time) Dynamic Standing Balance Dynamic Standing - Balance Support: Bilateral upper extremity supported;During functional activity (RW) Dynamic Standing - Level of Assistance: 6: Modified independent (Device/Increase time) Extremity/Trunk Assessment RUE Assessment RUE Assessment: Within Functional Limits LUE Assessment LUE Assessment: Within Functional Limits   Jennifer [name] Jennifer Daliah Chaudoin, MS, OTR/L  02/18/2023, 11:59 AM

## 2023-02-18 NOTE — Progress Notes (Signed)
 Physical Therapy Session Note  Patient Details  Name: Jennifer [name] MRN: [id number] Date of Birth: November 09, 1980  Today's Date: 02/18/2023 PT Individual Time: 4403-4742 and [id number] PT Individual Time Calculation (min): 39 min and 71 min  Short Term Goals: Week 1:  PT Short Term Goal 1 (Week 1): STG=LTG due to LOS  Skilled Therapeutic Interventions/Progress Updates:   Treatment Session 1 Received pt sitting in [facility] with husband present for family education training. Pt agreeable to PT treatment and reported pain 7/10 in R residual limb (premedicated) - pt feeling drowsy and lethargic from pain medication. Session with emphasis on discharge planning, functional mobility/transfers, generalized strengthening and endurance, simulated car transfers, and WC mobility.  Pt's equipment delivered and pt performed all transfers with RW and mod I throughout session. MD arrived for morning rounds, then pt performed WC mobility 20ft using BUE and mod I in new WC to ortho gym with emphasis on UE strength. Pt performed simulated car transfer with RW and supervision. Pt politely declined ambulating on ramp due to pain/lethargy, stating the ramp really hurt her on Monday. Transported to dayroom and stood with RW and mod I and picked up object from floor using reacher and mod I. Transported back to room in [facility] dependently and went through sensation, MMT, and pain interference questionnaire.   Educated pt's husband on the following throughout session: - Education officer, community - purchasing walker bag - remaining on pt's R side with any standing mobility in case of LOB - WC parts management including donning/doffing legrests  Concluded session with pt sitting in [facility] with all needs within reach and husband at bedside.   Treatment Session 2 Received pt sitting upright in bed, pt agreeable to PT treatment, and reported pain 9/10 in R residual limb (premedicated). Session with emphasis on functional mobility/transfers,  generalized strengthening and endurance, and gait training. Pt transferred bed<>WC stand<>pivot without AD and supervision and educated pt on donning elevating legrests.  Pt performed WC mobility >343ft using BUE and mod I to dayroom with emphasis on BUE strength. Stood from [facility] with RW mod I and ambulated 54ft x 1 and 23ft x 1 with RW and mod I to mat - pt reporting new onset heartburn and requesting medicine (RN notified). Transitioned in/out prone/sidelying/supine independently with increased time due to pain and performed the following exercises using mirror for visual feedback with emphasis on hip flexor stretching, contracture prevention, and LE strength/ROM: -prone L hamstring curls 2x15 with 1lb ankle weight -prone R hip extension 2x5 -L sidelying R hip extension 2x10 -L sidelying R hip abduction 2x10 -supine L SLR with 1lb ankle weight 2x8 -supine L hip abduction with 1lb ankle weight 2x10 -supine L hip flexion with 1lb ankle weight 2x15 -seated trunk rotations with 4lb medicine ball 2x10 bilaterally Transferred mat<>WC via stand<>pivot without AD and supervision and transported back to room in WC dependently. Concluded session with pt sitting in WC with all needs within reach - provided pt with heat backs for muscle soreness after exercising.   Therapy Documentation Precautions:  Precautions Precautions: Fall Restrictions Weight Bearing Restrictions Per Provider Order: Yes RLE Weight Bearing Per Provider Order: Non weight bearing  Therapy/Group: Individual Therapy Nicolas Barren Zaunegger Nena Bank PT, DPT 02/18/2023, 6:54 AM

## 2023-02-18 NOTE — Progress Notes (Signed)
 Physical Therapy Discharge Summary  Patient Details  Name: Jennifer [name] MRN: [id number] Date of Birth: Jul 23, 1980  Date of Discharge from PT service:February 18, 2023  Patient has met 9 of 9 long term goals due to improved activity tolerance, improved balance, improved postural control, increased strength, increased range of motion, ability to compensate for deficits, improved awareness, and improved coordination.  Patient to discharge at a wheelchair level Modified Independent. Patient's care partner is independent to provide the necessary physical assistance at discharge. Pt's husband attended family education training on 1/15 and verbalized and demonstrated confidence with all tasks to ensure safe discharge home.   All goals met  Recommendation:  Patient will benefit from ongoing skilled PT services in outpatient setting to continue to advance safe functional mobility, address ongoing impairments in transfers, generalized strengthening and endurance, dynamic standing balance/coordination, gait training, limb loss education, and to minimize fall risk.  Equipment: 18x18 manual WC, RW  Reasons for discharge: treatment goals met and discharge from hospital  Patient/family agrees with progress made and goals achieved: Yes  PT Discharge Precautions/Restrictions Precautions Precautions: Fall Restrictions Weight Bearing Restrictions Per Provider Order: Yes RLE Weight Bearing Per Provider Order: Non weight bearing Pain Interference Pain Interference Pain Effect on Sleep: 4. Almost constantly Pain Interference with Therapy Activities: 1. Rarely or not at all Pain Interference with Day-to-Day Activities: 1. Rarely or not at all Cognition Overall Cognitive Status: Within Functional Limits for tasks assessed Arousal/Alertness: Awake/alert Orientation Level: Oriented X4 Memory: Appears intact Awareness: Appears intact Problem Solving: Appears intact Safety/Judgment: Appears  intact Sensation Sensation Light Touch: Impaired Detail Light Touch Impaired Details: Impaired RLE Hot/Cold: Not tested Proprioception: Impaired by gross assessment Stereognosis: Not tested Additional Comments: phantom pain in R residual limb, otherwise [facility] Coordination Gross Motor Movements are Fluid and Coordinated: No Fine Motor Movements are Fluid and Coordinated: Yes Coordination and Movement Description: altered balance strategies due to R AKA Finger Nose Finger Test: [facility] bilaterally Heel Shin Test: unable to perform due to R AKA Motor  Motor Motor: Abnormal postural alignment and control Motor - Skilled Clinical Observations: altered balance strategies due to R AKA  Mobility Bed Mobility Bed Mobility: Rolling Right;Rolling Left;Sit to Supine;Supine to Sit Rolling Right: Independent Rolling Left: Independent Supine to Sit: Independent Sit to Supine: Independent Transfers Transfers: Sit to Stand;Stand to Sit;Stand Pivot Transfers Sit to Stand: Independent with assistive device Stand to Sit: Independent with assistive device Stand Pivot Transfers: Independent with assistive device Transfer (Assistive device): Rolling walker Locomotion  Gait Ambulation: Yes Gait Assistance: Independent with assistive device Gait Distance (Feet): 27 Feet Assistive device: Rolling walker Gait Gait: Yes Gait Pattern: Impaired ("hop to" pattern) Gait velocity: decreased Stairs / Additional Locomotion Stairs: No Wheelchair Mobility Wheelchair Mobility: Yes Wheelchair Assistance: Independent with Scientist, research (life sciences): Both upper extremities Wheelchair Parts Management: Supervision/cueing Distance: >333ft  Trunk/Postural Assessment  Cervical Assessment Cervical Assessment: Within Lobbyist Thoracic Assessment: Within Functional Limits Lumbar Assessment Lumbar Assessment: Within Functional Limits Postural Control Postural Control:  Deficits on evaluation Righting Reactions: delayed on R due to AKA Protective Responses: delayed on R due to AKA  Balance Balance Balance Assessed: Yes Static Sitting Balance Static Sitting - Balance Support: Feet supported;Bilateral upper extremity supported Static Sitting - Level of Assistance: 7: Independent Dynamic Sitting Balance Dynamic Sitting - Balance Support: Feet supported;No upper extremity supported Dynamic Sitting - Level of Assistance: 6: Modified independent (Device/Increase time) Static Standing Balance Static Standing - Balance Support: Bilateral upper extremity  supported;During functional activity (RW) Static Standing - Level of Assistance: 6: Modified independent (Device/Increase time) Dynamic Standing Balance Dynamic Standing - Balance Support: Bilateral upper extremity supported;During functional activity (RW) Dynamic Standing - Level of Assistance: 6: Modified independent (Device/Increase time) Extremity Assessment  RLE Assessment RLE Assessment: Exceptions to [facility] General Strength Comments: tested sitting in WC RLE Strength Right Hip Flexion: 2+/5 Right Hip ABduction: 2/5 Right Hip ADduction: 2/5 Right Knee Flexion: 3/5 Right Knee Extension: 3-/5 LLE Assessment LLE Assessment: Exceptions to [facility] General Strength Comments: tested sitting in WC LLE Strength Left Hip Flexion: 4/5 Left Hip ABduction: 4-/5 Left Hip ADduction: 4-/5 Left Knee Flexion: 4/5 Left Knee Extension: 4-/5 Left Ankle Dorsiflexion: 4/5 Left Ankle Plantar Flexion: 4/5   Nicolas Barren Zaunegger Nena Bank PT, DPT 02/18/2023, 6:58 AM

## 2023-02-18 NOTE — Discharge Summary (Signed)
Physician Discharge Summary  Patient ID: Jennifer [name] MRN: [id number] DOB/AGE: [birth year] 45 y.o.  Admit date: 02/12/2023 Discharge date: 02/20/2023  Discharge Diagnoses:  Principal Problem:   Above knee amputation of right lower extremity (HCC) Active Problems:   OSA (obstructive sleep apnea)   Essential hypertension   Diabetic peripheral neuropathy (HCC)   Anemia   Controlled type 2 diabetes mellitus without complication, without long-term current use of insulin (HCC)   PAF (paroxysmal atrial fibrillation) (HCC)   Discharged Condition: stable  Significant Diagnostic Studies: DG Chest Port 1 View Result Date: 01/23/2023 CLINICAL DATA:  Vasculitis. EXAM: PORTABLE CHEST 1 VIEW COMPARISON:  05/26/2022 FINDINGS: The lungs are clear without focal pneumonia, edema, pneumothorax or pleural effusion. The cardiopericardial silhouette is within normal limits for size. No acute bony abnormality. IMPRESSION: No active disease. Electronically Signed   By: Kennith Center M.D.   On: 01/23/2023 14:55    Labs:  Basic Metabolic Panel: Recent Labs  Lab 02/13/23 1712 02/16/23 0602 02/19/23 0605  NA 132* 135 138  K 4.2 4.4 4.5  CL 97* 99 103  CO2 24 27 24   GLUCOSE 118* 127* 94  BUN 18 19 21*  CREATININE 1.46* 1.55* 1.63*  CALCIUM 9.2 9.4 9.1    CBC: Recent Labs  Lab 02/13/23 1712 02/16/23 0602 02/19/23 0605  WBC 6.8 7.7 7.7  NEUTROABS 4.1  --   --   HGB 7.7* 7.6* 7.6*  HCT 25.9* 25.8* 26.5*  MCV 74.6* 74.1* 75.3*  PLT 401* 408* 388    CBG: Recent Labs  Lab 02/18/23 1145 02/18/23 1725 02/18/23 2043 02/19/23 0601 02/19/23 1121  GLUCAP 96 93 121* 89 75    Brief HPI:   Jennifer [name] is a 43 y.o. female with history of T1DM, CKD, OSA, HTN, fibroids with history of menorrhagia, A-flutter, anemia of chronic disease who was originally admitted to [facility] on 01/15/2023 with ischemic right lower extremity and severe pain.   Hospital Course: Jennifer [name] was admitted to  rehab 02/12/2023 for inpatient therapies to consist of PT, ST and OT at least three hours five days a week. Past admission physiatrist, therapy team and rehab RN have worked together to provide customized collaborative inpatient rehab.   Blood pressures were monitored on TID basis and   Diabetes has been monitored with ac/hs CBG checks and SSI was use prn for tighter BS control.    Rehab course: During patient's stay in rehab weekly team conferences were held to monitor patient's progress, set goals and discuss barriers to discharge. At admission, patient required  She  has had improvement in activity tolerance, balance, postural control as well as ability to compensate for deficits. She is able to complete ADL tasks at modified independent level. She is independent for transfers and is able to ambulate 35' with use of RW. Family education has been completed.    Discharge disposition: 01-Home or Self Care  Diet: Carb Modified.  Special Instructions: Monitor BS before meals and at bedtime. Keep incision clean and dry--no ointment, creams or lotions.  3.  Appointment with [facility] 01/28 at 10:30 am for wound check/suture removal.    Allergies as of 02/19/2023       Reactions   Meloxicam Nausea And Vomiting   Oxycodone Hives, Itching, Nausea And Vomiting        Medication List     STOP taking these medications    acetaminophen 500 MG tablet Commonly known as: TYLENOL   Baclofen 5 MG Tabs  HYDROmorphone 2 MG tablet Commonly known as: DILAUDID   Insulin Asp Prot & Asp FlexPen (70-30) 100 UNIT/ML FlexPen Commonly known as: NOVOLOG 70/30 MIX   insulin aspart 100 UNIT/ML injection Commonly known as: novoLOG   insulin glargine-yfgn 100 UNIT/ML injection Commonly known as: SEMGLEE Replaced by: Lantus SoloStar 100 UNIT/ML Solostar Pen   lidocaine 5 % Commonly known as: LIDODERM   magnesium gluconate 500 MG tablet Commonly known as: MAGONATE Replaced by: Magnesium 250 MG  Tabs   polyethylene glycol 17 g packet Commonly known as: MIRALAX / GLYCOLAX Replaced by: polyethylene glycol powder 17 GM/SCOOP powder       TAKE these medications    amitriptyline 25 MG tablet Commonly known as: ELAVIL Take 1 tablet (25 mg total) by mouth at bedtime.   ascorbic acid 500 MG tablet Commonly known as: VITAMIN C Take 1 tablet (500 mg total) by mouth daily with supper.   Blood Glucose Monitoring Suppl Devi 1 each by Does not apply route 4 (four) times daily -  before meals and at bedtime. May substitute to any manufacturer covered by patient's insurance.   BLOOD GLUCOSE TEST STRIPS Strp 1 each by In Vitro route in the morning, at noon, and at bedtime. May substitute to any manufacturer covered by patient's insurance.   cyclobenzaprine 10 MG tablet Commonly known as: FLEXERIL Take 1 tablet (10 mg total) by mouth 3 (three) times daily.   Depo-Provera 150 MG/ML injection Generic drug: medroxyPROGESTERone Inject 150 mg into the muscle every 3 (three) months.   diltiazem 180 MG 24 hr capsule Commonly known as: CARDIZEM CD Take 1 capsule (180 mg total) by mouth daily.   DULoxetine 30 MG capsule Commonly known as: CYMBALTA Take 1 capsule (30 mg total) by mouth daily.   Eliquis 5 MG Tabs tablet Generic drug: apixaban Take 1 tablet (5 mg total) by mouth 2 (two) times daily.   Ferrex 150 150 MG capsule Generic drug: iron polysaccharides Take 1 capsule (150 mg total) by mouth 2 (two) times daily before lunch and supper.   gabapentin 400 MG capsule Commonly known as: NEURONTIN Take 1 capsule (400 mg total) by mouth 3 (three) times daily. What changed: when to take this   hydrocerin Crea Apply 1 Application topically 2 (two) times daily.   Insupen Pen Needles 32G X 4 MM Misc Generic drug: Insulin Pen Needle Use 2 (two) times daily.   Lancet Device Misc 1 each by Does not apply route in the morning, at noon, and at bedtime. May substitute to any  manufacturer covered by patient's insurance.   Lancets Misc. Misc 1 Application by Does not apply route in the morning, at noon, and at bedtime. May substitute to any manufacturer covered by patient's insurance.   Lantus SoloStar 100 UNIT/ML Solostar Pen Generic drug: insulin glargine Inject 17 Units into the skin 2 (two) times daily. Replaces: insulin glargine-yfgn 100 UNIT/ML injection   Magnesium 250 MG Tabs Take 1 tablet (250 mg total) by mouth at bedtime. Replaces: magnesium gluconate 500 MG tablet   morphine 30 MG 12 hr tablet--Rx # 14 pills Commonly known as: MS CONTIN Take 1 tablet (30 mg total) by mouth every 12 (twelve) hours. What changed:  medication strength how much to take   morphine 15 MG tablet--Rx # 30 pills Commonly known as: MSIR Take 1 tablet (15 mg total) by mouth every 4 (four) hours as needed (breakthrough pain). What changed: You were already taking a medication with the same name, and  this prescription was added. Make sure you understand how and when to take each. Notes to patient: LIMIT TO FOUR TIMES A DAY AS NEEDED.    naloxone 4 MG/0.1ML Liqd nasal spray kit Commonly known as: NARCAN Use as needed for overdose   pantoprazole 40 MG tablet Commonly known as: PROTONIX Take 1 tablet (40 mg total) by mouth daily.   polyethylene glycol powder 17 GM/SCOOP powder Commonly known as: GLYCOLAX/MIRALAX Mix as directed and take 1 capful (17 g) by mouth daily as needed. Replaces: polyethylene glycol 17 g packet   senna 8.6 MG Tabs tablet Commonly known as: SENOKOT Take 2 tablets (17.2 mg total) by mouth at bedtime.   traMADol 50 MG tablet--Rx# 28 pills Commonly known as: ULTRAM Take 1 tablet (50 mg total) by mouth every 6 (six) hours as needed for moderate pain (pain score 4-6).   traZODone 100 MG tablet Commonly known as: DESYREL Take 1 tablet (100 mg total) by mouth at bedtime.        Follow-up Information     [facility] Follow up.   Specialty: General Practice Why: Call in 1-2 days for post hospital follow up Contact information: [address] [place] [address] [phone]         Horton Chin, MD Follow up.   Specialty: Physical Medicine and Rehabilitation Why: Appointment wtih Thora Lance NP Contact information: [address] [place] [address] [phone]                 Signed: Jacquelynn Cree 02/20/2023, 1:46 PM

## 2023-02-18 NOTE — Progress Notes (Signed)
 PROGRESS NOTE   Subjective/Complaints: Continues to have severe phantom limb pain. Amitriptyline  helped her to sleep from 8 to 12, she would like to try amitriptyline  25mg  tonight  ROS: +pain to residual limb, phantom pain- worsened, +insomnia- improved but still present Per HPI, denies CP, SOB, abd pain, n/v/d/c  Objective:   No results found. Recent Labs    02/16/23 0602  WBC 7.7  HGB 7.6*  HCT 25.8*  PLT 408*   Recent Labs    02/16/23 0602  NA 135  K 4.4  CL 99  CO2 27  GLUCOSE 127*  BUN 19  CREATININE 1.55*  CALCIUM  9.4    Intake/Output Summary (Last 24 hours) at 02/18/2023 0933 Last data filed at 02/18/2023 0700 Gross per 24 hour  Intake 714 ml  Output --  Net 714 ml        Physical Exam: Vital Signs Blood pressure 112/73, pulse 74, temperature 97.9 F (36.6 C), temperature source Oral, resp. rate 18, height 5\' 6"  (1.676 m), weight 77.8 kg, SpO2 98%.  Gen: no distress, normal appearing, sitting up at EOB, rubbing AKA stump.  HEENT: oral mucosa pink and moist, NCAT Cardio: Reg rate and rhythm, no m/r/g appreciated Chest: normal effort, normal rate of breathing, CTAB Abd: soft, non-distended, nonTTP, +BS throughout Ext: no edema in LLE, RLE AKA, shrinker in place Psych: pleasant, normal affect Skin: R AKA incisions not assessed today Psych: Flat but appropriate.  Neuro: AAOx4. No apparent cognitive deficits + Phantom sensations in RLE Sensation intact. Strength 0/5 RLE, otherwise 5/5. No apparent cognitive deficits. CN 2-12 intact. Reflexes 2+. Stable 1/15, incision without drainage  Musculoskeletal:      + R-AKA incisions C/D/I with staples and sutures in place.  AROM RLE HF limited due to pain.    Skin: Skin is warm and dry. Small amount of serousanguinous drainage form surgical site.     Assessment/Plan: 1. Functional deficits which require 3+ hours per day of interdisciplinary therapy  in a comprehensive inpatient rehab setting. Physiatrist is providing close team supervision and 24 hour management of active medical problems listed below. Physiatrist and rehab team continue to assess barriers to discharge/monitor patient progress toward functional and medical goals  Care Tool:  Bathing    Body parts bathed by patient: Right arm, Left arm, Chest, Abdomen, Front perineal area, Buttocks, Right upper leg, Left upper leg, Face, Left lower leg   Body parts bathed by helper: Left lower leg Body parts n/a: Right lower leg   Bathing assist Assist Level: Independent with assistive device     Upper Body Dressing/Undressing Upper body dressing   What is the patient wearing?: Pull over shirt    Upper body assist Assist Level: Independent    Lower Body Dressing/Undressing Lower body dressing      What is the patient wearing?: Pants, Underwear/pull up, Ace wrap/stump shrinker     Lower body assist Assist for lower body dressing: Independent with assitive device     Toileting Toileting    Toileting assist Assist for toileting: Independent with assistive device     Transfers Chair/bed transfer  Transfers assist     Chair/bed transfer assist level: Supervision/Verbal  cueing     Locomotion Ambulation   Ambulation assist      Assist level: Contact Guard/Touching assist Assistive device: Walker-rolling Max distance: 60ft   Walk 10 feet activity   Assist     Assist level: Contact Guard/Touching assist Assistive device: Walker-rolling   Walk 50 feet activity   Assist Walk 50 feet with 2 turns activity did not occur: Safety/medical concerns (fatigue, pain)         Walk 150 feet activity   Assist Walk 150 feet activity did not occur: Safety/medical concerns (fatigue, pain)         Walk 10 feet on uneven surface  activity   Assist Walk 10 feet on uneven surfaces activity did not occur: Safety/medical concerns (fatigue, pain)          Wheelchair     Assist Is the patient using a wheelchair?: Yes Type of Wheelchair: Manual    Wheelchair assist level: Supervision/Verbal cueing Max wheelchair distance: 79ft    Wheelchair 50 feet with 2 turns activity    Assist        Assist Level: Supervision/Verbal cueing   Wheelchair 150 feet activity     Assist      Assist Level: Moderate Assistance - Patient 50 - 74%   Blood pressure 112/73, pulse 74, temperature 97.9 F (36.6 C), temperature source Oral, resp. rate 18, height 5\' 6"  (1.676 m), weight 77.8 kg, SpO2 98%.  Medical Problem List and Plan: 1. 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  Antithrombotics: -continue Eliquis  5mg  BID             -antiplatelet therapy: N/A 3. Pain Management:  MSContin changed to MSIR--change IV dilaudid  to po prn. Increase cymbalta  to 30mg  daily             --Gabapentin  400mg  QID for neuropathy and encourage desensitization             --Flexeril  prn for muscle spasms. - Discussed wean down on pain medication with patient prior to IRF admission; she endorses 5/10 pain if asking for PRNs -02/15/23 pt having a hard time with phantom pain, reports previously was on Gabapentin  800mg  QID but I can't find this-- I see 600mg  TID in prior H&Ps. For now, will increase to 600mg  QID from 400mg , and see if this makes a difference-- weekday team might need to reach out to her pain doctor to see what she was actually taking previously.  -Encouraged pt to space out narcotics to avoid side effects and have less time between a dose and improve pain control Amitriptyline  10mg  ordered HS   4. Insomnia: increase amitriptyline  to 25mg  HS --has trazodone  100mg  at bedtime for insomnia but having difficulty sleeping due to  adjustment issues.   -02/14/23 slept ok, cont regimen             -antipsychotic agents: N/A   5. Neuropsych/cognition: This patient is capable of making decisions on her own behalf.   6. Skin/Wound Care: Monitor wound for healing. Add vitamin C  to promote healing.               -  Encourage wrapping/compression for edema    7. Fluids/Electrolytes/Nutrition: Monitor I/O. Encourage fluid intake   8. T1DM: Hgb A1C-9.2. Was on 70/30 45 units TID AC prior to admission  Provided list of foods for good diabetes control  D/c ISS, Semglee  decreased to 18U BID  CBG (last 3)  Recent Labs    02/17/23 1614 02/17/23 1956 02/18/23 0619  GLUCAP 116* 123* 104*      9. A fib: Monitor HR TID--continue Cardizem  180mg  daily and Eliquis .  10. OSA: Encourage CPAP use.  11. Hyponatremia: Likely related to poor intake which is up to 60% in past 24 hrs.              -02/13/23 132, fairly stable, monitor as indicated 12. Acute on chronic renal failure?: BUN/SCr 14/1.33 at admission-->BUN/SCr 24/1.4             --encourage fluid intake.  13. Iron  deficiency/Anemia of chronic disease: Baseline Hgb is around 8 due to hx of menorrhagia/Fibroids               --continue Iron  supplement  14. Depression: increase cymbalta  to 30mg  daily  15. Overweight: provided dietary education.   16. Constipation: continue miralax  daily, senokot 2 tabs nightly     LOS: 6 days A FACE TO FACE EVALUATION WAS PERFORMED  Keven Pel Neyda [place]ango 02/18/2023, 9:33 AM

## 2023-02-18 NOTE — Progress Notes (Addendum)
 Patient ID: Jennifer [name], female   DOB: 08/17/1980, 43 y.o.   MRN: [id number]  SW informed by therapy team pt will need 3in1 BSC. SW will order.   SW ordered DME with [employer] via parachute.   SW received confirmation fax from [facility]- [facility]SS Dept confirming [facility] referral received.   1420-SW informed  pt husband on pt d/c date tomorrow. He was aware.   SW met with pt in room to discuss above, and informed on DME ordered.   Norval Been, MSW, LCSW Office: [phone] Cell: [phone] Fax: [phone]

## 2023-02-18 NOTE — Progress Notes (Signed)
Inpatient Rehabilitation Discharge Medication Review by a Pharmacist  A complete drug regimen review was completed for this patient to identify any potential clinically significant medication issues.  High Risk Drug Classes Is patient taking? Indication by Medication  Antipsychotic No   Anticoagulant Yes Apixaban - atrial fibrillation  Antibiotic No   Opioid Yes Morphine/Tramadol -  pain  Antiplatelet No   Hypoglycemics/insulin Yes Insulin glargine - DM Type 2  Vasoactive Medication Yes Diltiazem - atrial fibrillation  Chemotherapy No   Other Yes Duloxetine/amitriptyline - depression, neuropathic pain Gabapentin - neuropathic pain Iron polysaccharide, mag gluconate, vitamin C - supplements Pantoprazole - GERD Trazodone - sleep Vit C - supplementation Depo-provera - contraceptive Flexeril - spasms     Type of Medication Issue Identified Description of Issue Recommendation(s)  Drug Interaction(s) (clinically significant)     Duplicate Therapy     Allergy     No Medication Administration End Date     Incorrect Dose     Additional Drug Therapy Needed     Significant med changes from prior encounter (inform family/care partners about these prior to discharge).    Other       Clinically significant medication issues were identified that warrant physician communication and completion of prescribed/recommended actions by midnight of the next day:  No   Pharmacist comments:   Time spent performing this drug regimen review (minutes):  [address], PharmD, [place], AAHIVP, CPP Infectious Disease Pharmacist 02/18/2023 12:15 PM

## 2023-02-18 NOTE — Progress Notes (Signed)
 Occupational Therapy Session Note  Patient Details  Name: Jennifer [name] MRN: [id number] Date of Birth: [birth year]  Today's Date: 02/18/2023 OT Individual Time: 0805-0900 & 1100-1128 OT Individual Time Calculation (min): 55 min & 28 min   Short Term Goals: Week 1:  OT Short Term Goal 1 (Week 1): STG = LTG due to ELOS  Skilled Therapeutic Interventions/Progress Updates:  Session 1 Skilled OT intervention completed with focus on ADL retraining, functional endurance, and mobility within a shower context. Pt received upright in bed, agreeable to session. Unrated pain reported in Rt residual limb; nurse notified of pain med request. OT offered rest breaks, repositioning and moist heat via shower for pain relief. Pt's husband present to observe ADLs at mod I level; education offered in greater detail about coverage of limb prior to shower, and shrinker donning/limb care techniques.  Pt completed all sit > stands and ambulatory transfers with mod I while using RW. Did need supervision for transfer into shower for sequencing stepping in then turning water on and doffing clothes for safety.   Pt able to apply waterproof cover to Rt residual limb prior to shower. Pt was able to bathe all parts with mod I, at the sit > stand level while using grab bar for balance. Mod I transfer to w/c. Able to donn shirt/deo independently. OT doffed current residual limb incision bandage with total A- no drainage noted. Pt applied AKA shrinker on donning tube without assist, then donned over residual limb with supervision/min cues for technique with increased time. Nurse notified of bandage removal from skin break down on medial Rt thigh, and decision to leave bandage off to allow skin to dry with adhesive strap rolled away from skin for further protection. Threaded LB clothing with mod I at the sit > stand level with RW. Donned socks with mod I.  CSW notified of no longer need for shower stool for stairs as pt's family  has moved to ramp entrance home since prior admission, with additional need of BSC for toileting at bedside with pain levels. Pt remained seated in w/c, with husband present and with all needs in reach at end of session.  Session 2 Skilled OT intervention completed with focus on toileting needs, BUE endurance. Pt received seated EOB, agreeable to session. No pain reported.  Pt requesting to change clothes due to menstrual bleeding. Completed all sit > stands and stand pivot transfers with mod I using RW. Transferred to w/c, self-propelled in w/c > BSC over toilet. Able to manage all toileting needs for urinary void with mod I as well as changing of clothes. Transferred back to w/c. OT applied lotion to LLE per request.   Pt self-propelled in w/c with mod I <> gym about 100 ft total. Pt completed the following intervals while seated at the SCIFIT to promote global/BUE endurance needed for independence with BADLs and functional transfers: -4 min, level 5, forwards -4 min, level 5, backwards Intermittent rest breaks needed for fatigue  Back in room, pt remained seated in w/c, with chair alarm on/activated, and with all needs in reach at end of session.   Therapy Documentation Precautions:  Precautions Precautions: Fall Restrictions Weight Bearing Restrictions Per Provider Order: Yes RLE Weight Bearing Per Provider Order: Non weight bearing    Therapy/Group: Individual Therapy  Ruthanna Covert, MS, OTR/L  02/18/2023, 11:56 AM

## 2023-02-19 ENCOUNTER — Other Ambulatory Visit (HOSPITAL_COMMUNITY): Payer: Self-pay

## 2023-02-19 ENCOUNTER — Telehealth (HOSPITAL_COMMUNITY): Payer: Self-pay | Admitting: Pharmacy Technician

## 2023-02-19 LAB — BASIC METABOLIC PANEL
Anion gap: 11 (ref 5–15)
BUN: 21 mg/dL — ABNORMAL HIGH (ref 6–20)
CO2: 24 mmol/L (ref 22–32)
Calcium: 9.1 mg/dL (ref 8.9–10.3)
Chloride: 103 mmol/L (ref 98–111)
Creatinine, Ser: 1.63 mg/dL — ABNORMAL HIGH (ref 0.44–1.00)
GFR, Estimated: 40 mL/min — ABNORMAL LOW (ref >60)
Glucose, Bld: 94 mg/dL (ref 70–99)
Potassium: 4.5 mmol/L (ref 3.5–5.1)
Sodium: 138 mmol/L (ref 135–145)

## 2023-02-19 LAB — CBC
HCT: 26.5 % — ABNORMAL LOW (ref 36.0–46.0)
Hemoglobin: 7.6 g/dL — ABNORMAL LOW (ref 12.0–15.0)
MCH: 21.6 pg — ABNORMAL LOW (ref 26.0–34.0)
MCHC: 28.7 g/dL — ABNORMAL LOW (ref 30.0–36.0)
MCV: 75.3 fL — ABNORMAL LOW (ref 80.0–100.0)
Platelets: 388 10*3/uL (ref 150–400)
RBC: 3.52 MIL/uL — ABNORMAL LOW (ref 3.87–5.11)
RDW: 20.9 % — ABNORMAL HIGH (ref 11.5–15.5)
WBC: 7.7 10*3/uL (ref 4.0–10.5)
nRBC: 0 % (ref 0.0–0.2)

## 2023-02-19 LAB — GLUCOSE, CAPILLARY
Glucose-Capillary: 89 mg/dL (ref 70–99)
Glucose-Capillary: 75 mg/dL (ref 70–99)

## 2023-02-19 MED ORDER — NALOXONE HCL 4 MG/0.1ML NA LIQD: NASAL | 1 refills | Status: AC

## 2023-02-19 MED ORDER — BLOOD GLUCOSE TEST VI STRP: 1.0000 | ORAL_STRIP | Freq: Three times a day (TID) | 0 refills | Status: AC

## 2023-02-19 MED ORDER — BLOOD GLUCOSE MONITORING SUPPL DEVI: 1.0000 | Freq: Three times a day (TID) | 0 refills | Status: AC

## 2023-02-19 MED ORDER — LANCETS MISC. MISC: 1.0000 | Freq: Three times a day (TID) | 0 refills | Status: AC

## 2023-02-19 MED ORDER — LANCET DEVICE MISC: 1.0000 | Freq: Three times a day (TID) | 0 refills | Status: AC

## 2023-02-19 NOTE — Patient Care Conference (Signed)
Inpatient RehabilitationTeam Conference and Plan of Care Update Date: 02/18/2023   Time: 11:59 AM    Patient Name: Jennifer [name]      Medical Record Number: [id number]  Date of Birth: November 09, 1980 Sex: Female         Room/Bed: 4M13C/4M13C-01 Payor Info: Payor: [place] MEDICAID PREPAID HEALTH PLAN / Plan: [place] MEDICAID HEALTHY BLUE / Product Type: *No Product type* /    Admit Date/Time:  02/12/2023  3:17 PM  Primary Diagnosis:  Above knee amputation of right lower extremity [facility])  Hospital Problems: Principal Problem:   Above knee amputation of right lower extremity (HCC) Active Problems:   OSA (obstructive sleep apnea)   Essential hypertension   Diabetic peripheral neuropathy (HCC)   Anemia   Controlled type 2 diabetes mellitus without complication, without long-term current use of insulin (HCC)   PAF (paroxysmal atrial fibrillation) [facility])    Expected Discharge Date: Expected Discharge Date: 02/19/23  Team Members Present: Physician leading conference: Dr. Sula Soda Social Worker Present: Dossie Der, LCSW Nurse Present: Chana Bode, RN;Nashonda Limberg Trilby Drummer, RN PT Present: Blima Rich, PT OT Present: Candee Furbish, OT PPS Coordinator present : Edson Snowball, PT     Current Status/Progress Goal Weekly Team Focus  Bowel/Bladder   Patient is continent of bowel and bladder.   Patient shall remain continent of bowel and bladder.   Continue to demonstrate safe transfers from bed to chair and chair to toilet.    Swallow/Nutrition/ Hydration               ADL's   Set up A UB, min A LB for donning of shrinker otherwise CGA, CGA toileting   Mod I   Barriers- pain, functional endurance deficits, Rt residual limb weakness    Mobility   bed mobility mod I, transfers with RW CGA/close supervision, gait 68ft with RW CGA, WC mobility >121ft mod I   Mod I  barriers: pain, fear/anxiety regarding healing of limb, altered balance strategies due to R AKA     Communication                Safety/Cognition/ Behavioral Observations               Pain   Patient uses 1-10 pain scale.  She rates pain at 10 but is frequenly groggy/lethargic from the pain medication she takes.  This is concerning for the fall risk it adds.   Pain should be controlled without affecting safety.   Find ways to manage pain that keep patient more alert so she can be more active and less at risk for falls. Consider something like accupuncture since phantom pain is so diffucult to treat.    Skin   Skin integrity is good for this paitent.  She has a surgical incision which appears to be healing well.   Maintain skin integrity  Increase protein intake.  Continue to move frequently and keep blood sugars well controlled. Good hygeine.      Discharge Planning:  Discharging home with spouse and daughter, 5. Spouse works until 530P. 1 level home, 5 steps. [employer] for DME- w/c with R amuptee ped; TTB, shower stool, and RW. Therapies deferred until prosthesis. Fam edu Wed 8am-10am with pt husband. SW will confirm there are no barriers to discharge.   Team Discussion: Right AKA. Severe neuropathic pain. Working on pain control. Blood sugars have been good. Incision looks great. Wearing shrinker without dressings. Has equipment except for [facility].  Ramp has been  placed.  Patient on target to meet rehab goals: yes, meeting goals with discharge 02/19/2023  *See Care Plan and progress notes for long and short-term goals.   Revisions to Treatment Plan:  Adjusted Amitriptyline. Decrease insulin. Dexcom for home. Monitor labs and VS.  Teaching Needs: Medications, safety, self care, transfers, toileting, skin/wound care, etc.  Current Barriers to Discharge: Decreased caregiver support  Possible Resolutions to Barriers: Family education completed Ordering [facility]     Medical Summary Current Status: phantom limb pain, depression, insomnia, AKI, AKA incisoin  Barriers to  Discharge: Complicated Wound  Barriers to Discharge Comments: phantom limb pain, depression, insomnia, AKI, AKA incision Possible Resolutions to [employer] Focus: amitirptyline started at night, cymbalta started during the day, gabapenitn dose decreased, incision monitored and healing well, continue shrinker   Continued Need for Acute Rehabilitation Level of Care: The patient requires daily medical management by a physician with specialized training in physical medicine and rehabilitation for the following reasons: Direction of a multidisciplinary physical rehabilitation program to maximize functional independence : Yes Medical management of patient stability for increased activity during participation in an intensive rehabilitation regime.: Yes Analysis of laboratory values and/or radiology reports with any subsequent need for medication adjustment and/or medical intervention. : Yes   I attest that I was present, lead the team conference, and concur with the assessment and plan of the team.   Jearld Adjutant 02/20/2023, 11:49 AM

## 2023-02-19 NOTE — Telephone Encounter (Signed)
Pharmacy Patient Advocate Encounter   Received notification that prior authorization for traMADol HCl 50MG  tablets is required/requested.   Insurance verification completed.   The patient is insured through [facility] .   Per test claim: PA required; PA started via CoverMyMeds. KEY [id number] . Waiting for clinical questions to populate.

## 2023-02-19 NOTE — Telephone Encounter (Signed)
Pharmacy Patient Advocate Encounter  Received notification from [facility] that Prior Authorization for traMADol HCl 50MG  tablets  has been APPROVED from 01/16/205 to 08/18/2023   PA #/Case ID/Reference #: [id number]

## 2023-02-19 NOTE — Telephone Encounter (Signed)
Pharmacy Patient Advocate Encounter   Received notification that prior authorization for Morphine Sulfate ER 30MG  er tablets is required/requested.   Insurance verification completed.   The patient is insured through [facility] .   Per test claim: PA required; PA started via CoverMyMeds. KEY [id number] . Waiting for clinical questions to populate.

## 2023-02-19 NOTE — Telephone Encounter (Signed)
Pharmacy Patient Advocate Encounter  Received notification from [facility] that Prior Authorization for Morphine Sulfate ER 30MG  er tablets  has been APPROVED from 02/19/2023 to 05/20/2023   PA #/Case ID/Reference #: [id number]

## 2023-02-19 NOTE — Progress Notes (Signed)
PROGRESS NOTE   Subjective/Complaints: Very excited to go home today Caregiver training went well with husband yesterday Labs stable today  ROS: +pain to residual limb, phantom pain- worsened, +insomnia- improved but still present Per HPI, denies CP, SOB, abd pain, n/v/d/c  Objective:   No results found. Recent Labs    02/19/23 0605  WBC 7.7  HGB 7.6*  HCT 26.5*  PLT 388   Recent Labs    02/19/23 0605  NA 138  K 4.5  CL 103  CO2 24  GLUCOSE 94  BUN 21*  CREATININE 1.63*  CALCIUM 9.1    Intake/Output Summary (Last 24 hours) at 02/19/2023 0948 Last data filed at 02/19/2023 0924 Gross per 24 hour  Intake 720 ml  Output --  Net 720 ml        Physical Exam: Vital Signs Blood pressure 115/79, pulse 92, temperature 98.4 F (36.9 C), resp. rate 17, height 5\' 6"  (1.676 m), weight 77.8 kg, SpO2 98%.  Gen: no distress, normal appearing, sitting up at EOB, rubbing AKA stump.  HEENT: oral mucosa pink and moist, NCAT Cardio: Reg rate and rhythm, no m/r/g appreciated Chest: normal effort, normal rate of breathing, CTAB Abd: soft, non-distended, nonTTP, +BS throughout Ext: no edema in LLE, RLE AKA, shrinker in place Psych: pleasant, normal affect Skin: R AKA incisions not assessed today Psych: Flat but appropriate.  Neuro: AAOx4. No apparent cognitive deficits + Phantom sensations in RLE Sensation intact. Strength 0/5 RLE, otherwise 5/5. No apparent cognitive deficits. CN 2-12 intact. Reflexes 2+. Stable 1/15, incision without drainage  Musculoskeletal:      + R-AKA incisions C/D/I with staples and sutures in place.  AROM RLE HF limited due to pain.    Skin: Skin is warm and dry. Small amount of serousanguinous drainage form surgical site. Stable 02/19/23     Assessment/Plan: 1. Functional deficits which require 3+ hours per day of interdisciplinary therapy in a comprehensive inpatient rehab  setting. Physiatrist is providing close team supervision and 24 hour management of active medical problems listed below. Physiatrist and rehab team continue to assess barriers to discharge/monitor patient progress toward functional and medical goals  Care Tool:  Bathing    Body parts bathed by patient: Right arm, Left arm, Chest, Abdomen, Front perineal area, Buttocks, Right upper leg, Left upper leg, Face, Left lower leg   Body parts bathed by helper: Left lower leg Body parts n/a: Right lower leg   Bathing assist Assist Level: Independent with assistive device     Upper Body Dressing/Undressing Upper body dressing   What is the patient wearing?: Pull over shirt    Upper body assist Assist Level: Independent    Lower Body Dressing/Undressing Lower body dressing      What is the patient wearing?: Pants, Underwear/pull up, Ace wrap/stump shrinker     Lower body assist Assist for lower body dressing: Independent with assitive device     Toileting Toileting    Toileting assist Assist for toileting: Independent with assistive device     Transfers Chair/bed transfer  Transfers assist     Chair/bed transfer assist level: Independent with assistive device Chair/bed transfer assistive device: Dan Humphreys  Locomotion Ambulation   Ambulation assist      Assist level: Independent with assistive device Assistive device: Walker-rolling Max distance: 35ft   Walk 10 feet activity   Assist     Assist level: Independent with assistive device Assistive device: Walker-rolling   Walk 50 feet activity   Assist Walk 50 feet with 2 turns activity did not occur: Safety/medical concerns (pain, fatigue, decreased balance)         Walk 150 feet activity   Assist Walk 150 feet activity did not occur: Safety/medical concerns (pain, fatigue, decreased balance)         Walk 10 feet on uneven surface  activity   Assist Walk 10 feet on uneven surfaces activity did  not occur: Safety/medical concerns (pain, fatigue, decreased balance)         Wheelchair     Assist Is the patient using a wheelchair?: Yes Type of Wheelchair: Manual    Wheelchair assist level: Independent Max wheelchair distance: >367ft    Wheelchair 50 feet with 2 turns activity    Assist        Assist Level: Independent   Wheelchair 150 feet activity     Assist      Assist Level: Independent   Blood pressure 115/79, pulse 92, temperature 98.4 F (36.9 C), resp. rate 17, height 5\' 6"  (1.676 m), weight 77.8 kg, SpO2 98%.  Medical Problem List and Plan: 1. 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  Antithrombotics: -continue Eliquis 5mg  BID             -antiplatelet therapy: N/A 3. Pain Management:  MSContin changed to MSIR--change IV dilaudid to po prn. Increase cymbalta to 30mg  daily             --Gabapentin 400mg  QID for neuropathy and encourage desensitization             --Flexeril prn for muscle spasms. - Discussed wean down on pain medication with patient prior to IRF admission; she endorses 5/10 pain if asking for PRNs -02/15/23 pt having a hard time with phantom pain, reports previously was on Gabapentin 800mg  QID but I can't find this-- I see 600mg  TID in prior H&Ps. For now, will increase to 600mg  QID from 400mg , and see if this makes a difference-- weekday team might need to reach out to her pain doctor to see what she was actually taking previously.  -Encouraged pt to space out narcotics to avoid side effects and have less time between a dose and improve pain control Amitriptyline 10mg  ordered HS   4. Insomnia: increase amitriptyline to 25mg  HS --has trazodone 100mg  at bedtime for insomnia but having difficulty sleeping due to  adjustment issues.   -02/14/23 slept ok, cont regimen             -antipsychotic agents: N/A   5. Neuropsych/cognition: This patient is capable of making decisions on her own behalf.   6. Skin/Wound Care: Monitor wound for healing. Add vitamin C to promote healing.               - Encourage wrapping/compression for  edema    7. Fluids/Electrolytes/Nutrition: Monitor I/O. Encourage fluid intake   8. T1DM: Hgb A1C-9.2. Was on 70/30 45 units TID AC prior to admission  Provided list of foods for good diabetes control  D/c ISS, Semglee decreased to 18U BID  CBG (last 3)  Recent Labs    02/18/23 1725 02/18/23 2043 02/19/23 0601  GLUCAP 93 121* 89      9. A fib: Monitor HR TID--continue Cardizem 180mg  daily and Eliquis.  10. OSA: Encourage CPAP use.   11. Hyponatremia: Likely related to poor intake which is improved             -02/13/23 132, fairly stable, monitor as indicated  12. Acute on chronic renal failure?: BUN/SCr 14/1.33 at admission-->BUN/SCr 24/1.4             --encourage fluid intake.   -gabapentin dose decreased to 400mg  13. Iron deficiency/Anemia of chronic disease: Baseline Hgb is around 8 due to hx of menorrhagia/Fibroids               --continue Iron supplement  14. Depression: increase cymbalta to 30mg  daily. Amitriptyline 25mg  added at night  15. Overweight: provided dietary education.   16. Constipation: continue miralax daily, senokot 2 tabs nightly    >30 minutes spent in discharge of patient including review of medications and follow-up appointments, physical examination, and in answering all patient's questions   LOS: 7 days A FACE TO FACE EVALUATION WAS PERFORMED  Clint Bolder P Veer Elamin 02/19/2023, 9:48 AM

## 2023-02-20 NOTE — Patient Care Conference (Signed)
 Inpatient RehabilitationTeam Conference and Plan of Care Update Date: 02/18/23   Time: 1159 am    Patient Name: Jennifer [name]      Medical Record Number: [id number]  Date of Birth: 1980-02-15 Sex: Female         Room/Bed: 4M13C/4M13C-01 Payor Info: Payor: [place] MEDICAID PREPAID HEALTH PLAN / Plan: [place] MEDICAID HEALTHY BLUE / Product Type: *No Product type* /    Admit Date/Time:  02/12/2023  3:17 PM  Primary Diagnosis:  Above knee amputation of right lower extremity [facility])  Hospital Problems: Principal Problem:   Above knee amputation of right lower extremity (HCC) Active Problems:   OSA (obstructive sleep apnea)   Essential hypertension   Diabetic peripheral neuropathy (HCC)   Anemia   Controlled type 2 diabetes mellitus without complication, without long-term current use of insulin (HCC)   PAF (paroxysmal atrial fibrillation) [facility])    Expected Discharge Date: Expected Discharge Date: 02/19/23  Team Members Present: Physician leading conference: Dr. Sula Soda Social Worker Present: Dossie Der, LCSW Nurse Present: Chana Bode, RN;Karen Trilby Drummer, RN PT Present: Blima Rich, PT OT Present: Candee Furbish, OT PPS Coordinator present : Edson Snowball, PT     Current Status/Progress Goal Weekly Team Focus  Bowel/Bladder   Patient is continent of bowel and bladder.   Patient shall remain continent of bowel and bladder.   Continue to demonstrate safe transfers from bed to chair and chair to toilet.    Swallow/Nutrition/ Hydration               ADL's   Set up A UB, min A LB for donning of shrinker otherwise CGA, CGA toileting   Mod I   Barriers- pain, functional endurance deficits, Rt residual limb weakness    Mobility   bed mobility mod I, transfers with RW CGA/close supervision, gait 79ft with RW CGA, WC mobility >114ft mod I   Mod I  barriers: pain, fear/anxiety regarding healing of limb, altered balance strategies due to R AKA     Communication                Safety/Cognition/ Behavioral Observations               Pain   Patient uses 1-10 pain scale.  She rates pain at 10 but is frequenly groggy/lethargic from the pain medication she takes.  This is concerning for the fall risk it adds.   Pain should be controlled without affecting safety.   Find ways to manage pain that keep patient more alert so she can be more active and less at risk for falls. Consider something like accupuncture since phantom pain is so diffucult to treat.    Skin   Skin integrity is good for this paitent.  She has a surgical incision which appears to be healing well.   Maintain skin integrity  Increase protein intake.  Continue to move frequently and keep blood sugars well controlled. Good hygeine.      Discharge Planning:      Team Discussion: Patient was admitted post AKA with severe neuropathic pain. Well controlled blood sugar . Incision is healing well.  Patient on target to meet rehab goals: Yes, on target to meet Mod I goals.  *See Care Plan and progress notes for long and short-term goals.   Revisions to Treatment Plan:  Recommending Dexcom   Teaching Needs: Safety, transfers, dietary modifications, medications, skin care , incision care, etc.  Current Barriers to Discharge: Home enviroment access/layout  and Weight bearing restrictions  Possible Resolutions to Barriers: Family education Outpatient follow-up DME: TTB, [facility]     Medical Summary Current Status: phantom limb pain, depression, insomnia, AKI, AKA incisoin  Barriers to Discharge: Complicated Wound  Barriers to Discharge Comments: phantom limb pain, depression, insomnia, AKI, AKA incision Possible Resolutions to [employer] Focus: amitirptyline started at night, cymbalta started during the day, gabapenitn dose decreased, incision monitored and healing well, continue shrinker   Continued Need for Acute Rehabilitation Level of Care: The patient  requires daily medical management by a physician with specialized training in physical medicine and rehabilitation for the following reasons: Direction of a multidisciplinary physical rehabilitation program to maximize functional independence : Yes Medical management of patient stability for increased activity during participation in an intensive rehabilitation regime.: Yes Analysis of laboratory values and/or radiology reports with any subsequent need for medication adjustment and/or medical intervention. : Yes   I attest that I was present, lead the team conference, and concur with the assessment and plan of the team.   Gwenyth Allegra 02/20/2023, 4:40 PM

## 2023-02-20 NOTE — Progress Notes (Signed)
Inpatient Rehabilitation Care Coordinator Discharge Note   Patient Details  Name: Jennifer [name] MRN: [id number] Date of Birth: 12-Dec-1980   Discharge location: D/c to home  Length of Stay: 6 days  Discharge activity level: Supervision  Home/community participation: Limited  Patient response WN:UUVOZD Literacy - How often do you need to have someone help you when you read instructions, pamphlets, or other written material from your doctor or pharmacy?: Never  Patient response GU:YQIHKV Isolation - How often do you feel lonely or isolated from those around you?: Never  Services provided included: MD, PT, RD, OT, RN, CM, TR, Pharmacy, Neuropsych, SW  Financial Services:  Field seismologist Utilized: Media planner [place] Medicaid Health Blue  Choices offered to/list presented to: patient  Follow-up services arranged:  DME, Other (Comment) (HH deferred until prosthesis)      DME : [employer] for RW, w/c, TTB, shower chair, 3in1 BSC    Patient response to transportation need: Is the patient able to respond to transportation needs?: Yes In the past 12 months, has lack of transportation kept you from medical appointments or from getting medications?: No In the past 12 months, has lack of transportation kept you from meetings, work, or from getting things needed for daily living?: No   Patient/Family verbalized understanding of follow-up arrangements:  Yes  Individual responsible for coordination of the follow-up plan: contact pt [phone]  Confirmed correct DME delivered: Gretchen Short 02/20/2023    Comments (or additional information):fam edu completed  Summary of Stay    Date/Time Discharge Planning CSW  02/16/23 1506 Discharging home with spouse and daughter, 73. Spouse works until 530P. 1 level home, 5 steps. [employer] for DME- w/c with R amuptee ped; TTB, shower stool, and RW. Therapies deferred until prosthesis. Fam edu Wed 8am-10am with pt husband. SW  will confirm there are no barriers to discharge. AAC       Jennifer [name]

## 2023-02-23 ENCOUNTER — Telehealth: Payer: Self-pay

## 2023-02-23 NOTE — Telephone Encounter (Signed)
Transition Care Management Unsuccessful Follow-up Telephone Call  Date of discharge and from where:  03/01/2023 [facility]  Attempts:  1st Attempt  Reason for unsuccessful TCM follow-up call:  Left voice message

## 2023-02-25 NOTE — Progress Notes (Deleted)
Subjective:    Patient ID: Jennifer [name], female    DOB: 06/14/1980, 43 y.o.   MRN: [id number]  HPI   Pain Inventory Average Pain {NUMBERS; 0-10:5044} Pain Right Now {NUMBERS; 0-10:5044} My pain is {PAIN DESCRIPTION:21022940}  In the last 24 hours, has pain interfered with the following? General activity {NUMBERS; 0-10:5044} Relation with others {NUMBERS; 0-10:5044} Enjoyment of life {NUMBERS; 0-10:5044} What TIME of day is your pain at its worst? {time of day:24191} Sleep (in general) {BHH GOOD/FAIR/POOR:22877}  Pain is worse with: {ACTIVITIES:21022942} Pain improves with: {PAIN IMPROVES WNUU:72536644} Relief from Meds: {NUMBERS; 0-10:5044}  {MOBILITY IHK:74259563}  {FUNCTION:21022946}  {NEURO/PSYCH:21022948}  {CPRM PRIOR STUDIES:21022953}  {CPRM PHYSICIANS INVOLVED IN YOUR CARE:21022954}    Family History  Adopted: Yes  Problem Relation Age of Onset   Diabetes Other    Hypertension Other    Social History   Socioeconomic History   Marital status: Married    Spouse name: Not on file   Number of children: Not on file   Years of education: Not on file   Highest education level: Not on file  Occupational History   Not on file  Tobacco Use   Smoking status: Every Day    Types: Cigarettes   Smokeless tobacco: Never  Substance and Sexual Activity   Alcohol use: No   Drug use: No   Sexual activity: Not on file  Other Topics Concern   Not on file  Social History Narrative   Not on file   Social Drivers of Health   Financial Resource Strain: Not on file  Food Insecurity: Patient Declined (02/06/2023)   Hunger Vital Sign    Worried About Running Out of Food in the Last Year: Patient declined    Ran Out of Food in the Last Year: Patient declined  Transportation Needs: Patient Declined (02/06/2023)   PRAPARE - Administrator, Civil Service (Medical): Patient declined    Lack of Transportation (Non-Medical): Patient declined  Physical  Activity: Not on file  Stress: Not on file  Social Connections: Not on file   Past Surgical History:  Procedure Laterality Date   AMPUTATION Right 01/17/2023   Procedure: AMPUTATION BELOW KNEE;  Surgeon: Iline Oven, MD;  Location: [facility] ORS;  Service: Vascular;  Laterality: Right;   AMPUTATION Right 02/05/2023   Procedure: AMPUTATION ABOVE KNEE;  Surgeon: Leonie Douglas, MD;  Location: [facility] OR;  Service: Vascular;  Laterality: Right;   CESAREAN SECTION     x2   LOWER EXTREMITY ANGIOGRAPHY Right 01/15/2023   Procedure: Lower Extremity Angiography;  Surgeon: Annice Needy, MD;  Location: [facility] INVASIVE CV LAB;  Service: Cardiovascular;  Laterality: Right;   none     Past Medical History:  Diagnosis Date   Abnormal uterine bleeding    Due to fibriods/required transfusions   Acute blood loss anemia 10/13/2021   Acute kidney injury superimposed on chronic kidney disease (HCC) 10/13/2021   Acute lower limb ischemia 01/15/2023   Atrial fibrillation with RVR (HCC) 05/2022   underwent cardioversion by Dr. Mariah Milling   Closed fracture of coronoid process of right ulna 2011   Diabetes mellitus without complication (HCC)    Diabetic ketoacidosis associated with type 1 diabetes mellitus (HCC) 10/06/2015   Headache    Hyperglycemia without ketosis 10/21/2015   Hypertension    Hypertensive urgency 10/13/2021   Menorrhagia    OSA (obstructive sleep apnea)    Serum potassium elevated 02/05/2023   There were no vitals taken  for this visit.  Opioid Risk Score:   Fall Risk Score:  `1  Depression screen PHQ 2/9      No data to display          Review of Systems     Objective:   Physical Exam        Assessment & Plan:

## 2023-02-26 ENCOUNTER — Encounter: Payer: Medicaid Other | Attending: Registered Nurse | Admitting: Registered Nurse

## 2023-03-02 NOTE — Progress Notes (Deleted)
POST OPERATIVE OFFICE NOTE    CC:  F/u for surgery  HPI:  This is a 43 y.o. female who is s/p right AKA on 02/05/2023 by Dr. Lenell Antu.    She had presented to [facility] with ischemic right foot and underwent stent placement to the right popliteal artery on 01/15/2023.  She subsequently underwent right BKA on 01/17/2023 by Dr. Wallace Cullens.   She was transferred to [facility] and [facility] was consulted on 01/29/2023 for ischemic changes to her right BKA stump and pain.   Pt returns today for follow up.  Pt states ***   Allergies  Allergen Reactions   Meloxicam Nausea And Vomiting   Oxycodone Hives, Itching and Nausea And Vomiting    Current Outpatient Medications  Medication Sig Dispense Refill   amitriptyline (ELAVIL) 25 MG tablet Take 1 tablet (25 mg total) by mouth at bedtime. 30 tablet 0   apixaban (ELIQUIS) 5 MG TABS tablet Take 1 tablet (5 mg total) by mouth 2 (two) times daily. 60 tablet 0   ascorbic acid (VITAMIN C) 500 MG tablet Take 1 tablet (500 mg total) by mouth daily with supper. 30 tablet 0   Blood Glucose Monitoring Suppl DEVI 1 each by Does not apply route 4 (four) times daily -  before meals and at bedtime. May substitute to any manufacturer covered by patient's insurance. 1 each 0   cyclobenzaprine (FLEXERIL) 10 MG tablet Take 1 tablet (10 mg total) by mouth 3 (three) times daily. 90 tablet 0   DEPO-PROVERA 150 MG/ML injection Inject 150 mg into the muscle every 3 (three) months.     diltiazem (CARDIZEM CD) 180 MG 24 hr capsule Take 1 capsule (180 mg total) by mouth daily. 30 capsule 0   DULoxetine (CYMBALTA) 30 MG capsule Take 1 capsule (30 mg total) by mouth daily. 30 capsule 0   gabapentin (NEURONTIN) 400 MG capsule Take 1 capsule (400 mg total) by mouth 3 (three) times daily. 90 capsule 0   Glucose Blood (BLOOD GLUCOSE TEST STRIPS) STRP 1 each by In Vitro route in the morning, at noon, and at bedtime. May substitute to any manufacturer covered by patient's insurance. 90 each 0    hydrocerin (EUCERIN) CREA Apply 1 Application topically 2 (two) times daily. 454 g 0   insulin glargine (LANTUS SOLOSTAR) 100 UNIT/ML Solostar Pen Inject 17 Units into the skin 2 (two) times daily. 15 mL 11   Insulin Pen Needle 32G X 4 MM MISC Use 2 (two) times daily. 100 each 0   iron polysaccharides (NIFEREX) 150 MG capsule Take 1 capsule (150 mg total) by mouth 2 (two) times daily before lunch and supper. 60 capsule 0   Lancet Device MISC 1 each by Does not apply route in the morning, at noon, and at bedtime. May substitute to any manufacturer covered by patient's insurance. 1 each 0   Lancets Misc. MISC 1 Application by Does not apply route in the morning, at noon, and at bedtime. May substitute to any manufacturer covered by patient's insurance. 100 each 0   Magnesium 250 MG TABS Take 1 tablet (250 mg total) by mouth at bedtime. 30 tablet 0   morphine (MS CONTIN) 30 MG 12 hr tablet Take 1 tablet (30 mg total) by mouth every 12 (twelve) hours. 14 tablet 0   morphine (MSIR) 15 MG tablet Take 1 tablet (15 mg total) by mouth every 4 (four) hours as needed (breakthrough pain). 30 tablet 0   naloxone (NARCAN) nasal spray 4 mg/0.1  mL Use as needed for overdose 2 each 1   pantoprazole (PROTONIX) 40 MG tablet Take 1 tablet (40 mg total) by mouth daily. 30 tablet 0   polyethylene glycol powder (GLYCOLAX/MIRALAX) 17 GM/SCOOP powder Mix as directed and take 1 capful (17 g) by mouth daily as needed. 238 g 0   senna (SENOKOT) 8.6 MG TABS tablet Take 2 tablets (17.2 mg total) by mouth at bedtime. 60 tablet 0   traMADol (ULTRAM) 50 MG tablet Take 1 tablet (50 mg total) by mouth every 6 (six) hours as needed for moderate pain (pain score 4-6). 28 tablet 0   traZODone (DESYREL) 100 MG tablet Take 1 tablet (100 mg total) by mouth at bedtime. 30 tablet 0   No current facility-administered medications for this visit.     ROS:  See HPI  Physical Exam:  ***  Incision:  *** Extremities:  *** Neuro:  *** Abdomen:  ***    Assessment/Plan:  This is a 43 y.o. female who is s/p: right AKA on 02/05/2023 by Dr. Lenell Antu.    -***   Doreatha Massed, [facility] [phone]   Clinic MD:  Lenell Antu

## 2023-03-11 ENCOUNTER — Ambulatory Visit (INDEPENDENT_AMBULATORY_CARE_PROVIDER_SITE_OTHER): Payer: Medicaid Other

## 2023-03-11 VITALS — BP 131/87 | HR 98 | Temp 97.7°F | Resp 20 | Ht 66.0 in | Wt 171.0 lb

## 2023-03-11 DIAGNOSIS — S78111A Complete traumatic amputation at level between right hip and knee, initial encounter: Secondary | ICD-10-CM

## 2023-03-11 NOTE — Progress Notes (Signed)
 POST OPERATIVE OFFICE NOTE    CC:  F/u for surgery  HPI:  This is a 43 y.o. 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  AKA was performed by Dr. Magda on 02/05/2023.  She believes the incision is healing well.  Unfortunately 2 days ago she fell directly onto her stump.  Allergies  Allergen Reactions   Meloxicam Nausea And Vomiting   Oxycodone  Hives, Itching and Nausea And Vomiting    Current Outpatient Medications  Medication Sig Dispense Refill   amitriptyline  (ELAVIL ) 25 MG tablet Take 1 tablet (25 mg total) by mouth at bedtime. 30 tablet 0   apixaban  (ELIQUIS ) 5 MG TABS tablet Take 1 tablet (5 mg total) by mouth 2 (two) times daily. 60 tablet 0   ascorbic acid  (VITAMIN C ) 500 MG tablet Take 1 tablet (500 mg total) by mouth daily with supper. 30 tablet 0   Blood Glucose Monitoring Suppl DEVI 1 each by Does not apply route 4 (four) times daily -  before meals and at bedtime. May substitute to any manufacturer covered by patient's insurance. 1 each 0   cyclobenzaprine  (FLEXERIL ) 10 MG tablet Take 1 tablet (10 mg total) by mouth 3 (three) times daily. 90 tablet 0   DEPO-PROVERA 150 MG/ML injection Inject 150 mg into the muscle every 3 (three) months.     diltiazem  (CARDIZEM  CD) 180 MG 24 hr capsule Take 1 capsule (180 mg total) by mouth daily. 30 capsule 0   DULoxetine  (CYMBALTA ) 30 MG capsule Take 1 capsule (30 mg total) by mouth daily. 30 capsule 0   gabapentin  (NEURONTIN ) 400 MG capsule Take 1 capsule (400 mg total) by mouth 3 (three) times daily. 90 capsule 0   Glucose Blood (BLOOD GLUCOSE TEST STRIPS) STRP 1 each by In Vitro route in the morning, at noon, and at bedtime. May substitute to any manufacturer covered by patient's insurance. 90 each 0   hydrocerin (EUCERIN) CREA Apply 1 Application topically 2 (two) times daily. 454 g 0   insulin  glargine (LANTUS  SOLOSTAR) 100 UNIT/ML Solostar Pen Inject 17 Units into the skin 2 (two)  times daily. 15 mL 11   Insulin  Pen Needle 32G X 4 MM MISC Use 2 (two) times daily. 100 each 0   iron  polysaccharides (NIFEREX) 150 MG capsule Take 1 capsule (150 mg total) by mouth 2 (two) times daily before lunch and supper. 60 capsule 0   Lancet Device MISC 1 each by Does not apply route in the morning, at noon, and at bedtime. May substitute to any manufacturer covered by patient's insurance. 1 each 0   Lancets Misc. MISC 1 Application by Does not apply route in the morning, at noon, and at bedtime. May substitute to any manufacturer covered by patient's insurance. 100 each 0   Magnesium  250 MG TABS Take 1 tablet (250 mg total) by mouth at bedtime. 30 tablet 0   morphine  (MS CONTIN ) 30 MG 12 hr tablet Take 1 tablet (30 mg total) by mouth every 12 (twelve) hours. 14 tablet 0   morphine  (MSIR) 15 MG tablet Take 1 tablet (15 mg total) by mouth every 4 (four) hours as needed (breakthrough pain). 30 tablet 0   naloxone  (NARCAN ) nasal spray 4 mg/0.1 mL Use as needed for overdose 2 each 1   pantoprazole  (PROTONIX ) 40 MG tablet Take 1 tablet (40 mg total) by mouth daily. 30 tablet 0   polyethylene glycol powder (GLYCOLAX /MIRALAX ) 17 GM/SCOOP powder Mix as directed  and take 1 capful (17 g) by mouth daily as needed. 238 g 0   senna (SENOKOT) 8.6 MG TABS tablet Take 2 tablets (17.2 mg total) by mouth at bedtime. 60 tablet 0   traMADol  (ULTRAM ) 50 MG tablet Take 1 tablet (50 mg total) by mouth every 6 (six) hours as needed for moderate pain (pain score 4-6). 28 tablet 0   traZODone  (DESYREL ) 100 MG tablet Take 1 tablet (100 mg total) by mouth at bedtime. 30 tablet 0   No current facility-administered medications for this visit.     ROS:  See HPI  Physical Exam:  Vitals:   03/11/23 1220  BP: 131/87  Pulse: 98  Resp: 20  Temp: 97.7 F (36.5 C)  TempSrc: Temporal  SpO2: 98%  Weight: 171 lb (77.6 kg)  Height: 5' 6 (1.676 m)    Incision: Incision is well-appearing and seems to be healing well;  no palpable firm hematoma or fluid collection Neuro: A&O  Assessment/Plan:  This is a 43 y.o. 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  No evidence of underlying hematoma.  No incision dehiscence on exam.  Every other staple was removed in the office today.  Sutures were left in place.  She will return in another 1 to 2 weeks to have the remainder of staples and sutures removed.  If the right AKA stump continues to look good, she will be referred to [facility] clinic to start the process of obtaining a prosthetic leg.   Donnice Sender, PA-C [facility] [phone]  Clinic MD:  [place]

## 2023-03-24 ENCOUNTER — Ambulatory Visit (INDEPENDENT_AMBULATORY_CARE_PROVIDER_SITE_OTHER): Payer: Medicaid Other | Admitting: Physician Assistant

## 2023-03-24 VITALS — BP 183/107 | HR 85 | Temp 98.0°F | Resp 18 | Ht 66.0 in

## 2023-03-24 DIAGNOSIS — S78111A Complete traumatic amputation at level between right hip and knee, initial encounter: Secondary | ICD-10-CM

## 2023-03-24 NOTE — Progress Notes (Signed)
POST OPERATIVE OFFICE NOTE    CC:  F/u for surgery  HPI:  This is a 43 y.o. female who is s/p right above knee amputation 02/05/2023 by Dr. Lenell Antu after non healing below knee amputation performed at [facility] 01/17/2023.  She was seen on 03/11/2023 and at that time, her incision was doing well.  She had fallen a couple of days prior onto her stump.  She did not have any dehiscence or hematoma.  Every other staple was removed and sutures were left in place.   Pt returns today for follow up & here with her husband of 15 years.  Pt states she continues to have some phantom pain.  She does have some pain in the stump where she fell a couple of weeks ago.  This is healing nicely.  She is ready to start the process for a prosthesis.  She is not having any issues with the left leg.  She quit smoking in November 2024.    Allergies  Allergen Reactions   Meloxicam Nausea And Vomiting   Oxycodone Hives, Itching and Nausea And Vomiting    Current Outpatient Medications  Medication Sig Dispense Refill   amitriptyline (ELAVIL) 25 MG tablet Take 1 tablet (25 mg total) by mouth at bedtime. 30 tablet 0   apixaban (ELIQUIS) 5 MG TABS tablet Take 1 tablet (5 mg total) by mouth 2 (two) times daily. 60 tablet 0   ascorbic acid (VITAMIN C) 500 MG tablet Take 1 tablet (500 mg total) by mouth daily with supper. 30 tablet 0   Blood Glucose Monitoring Suppl DEVI 1 each by Does not apply route 4 (four) times daily -  before meals and at bedtime. May substitute to any manufacturer covered by patient's insurance. 1 each 0   cyclobenzaprine (FLEXERIL) 10 MG tablet Take 1 tablet (10 mg total) by mouth 3 (three) times daily. 90 tablet 0   DEPO-PROVERA 150 MG/ML injection Inject 150 mg into the muscle every 3 (three) months.     diltiazem (CARDIZEM CD) 180 MG 24 hr capsule Take 1 capsule (180 mg total) by mouth daily. 30 capsule 0   DULoxetine (CYMBALTA) 30 MG capsule Take 1 capsule (30 mg total) by mouth daily. 30 capsule 0    gabapentin (NEURONTIN) 400 MG capsule Take 1 capsule (400 mg total) by mouth 3 (three) times daily. 90 capsule 0   hydrocerin (EUCERIN) CREA Apply 1 Application topically 2 (two) times daily. 454 g 0   insulin glargine (LANTUS SOLOSTAR) 100 UNIT/ML Solostar Pen Inject 17 Units into the skin 2 (two) times daily. 15 mL 11   Insulin Pen Needle 32G X 4 MM MISC Use 2 (two) times daily. 100 each 0   iron polysaccharides (NIFEREX) 150 MG capsule Take 1 capsule (150 mg total) by mouth 2 (two) times daily before lunch and supper. 60 capsule 0   Magnesium 250 MG TABS Take 1 tablet (250 mg total) by mouth at bedtime. 30 tablet 0   morphine (MS CONTIN) 30 MG 12 hr tablet Take 1 tablet (30 mg total) by mouth every 12 (twelve) hours. 14 tablet 0   morphine (MSIR) 15 MG tablet Take 1 tablet (15 mg total) by mouth every 4 (four) hours as needed (breakthrough pain). 30 tablet 0   naloxone (NARCAN) nasal spray 4 mg/0.1 mL Use as needed for overdose 2 each 1   pantoprazole (PROTONIX) 40 MG tablet Take 1 tablet (40 mg total) by mouth daily. 30 tablet 0   polyethylene  glycol powder (GLYCOLAX/MIRALAX) 17 GM/SCOOP powder Mix as directed and take 1 capful (17 g) by mouth daily as needed. 238 g 0   senna (SENOKOT) 8.6 MG TABS tablet Take 2 tablets (17.2 mg total) by mouth at bedtime. 60 tablet 0   traMADol (ULTRAM) 50 MG tablet Take 1 tablet (50 mg total) by mouth every 6 (six) hours as needed for moderate pain (pain score 4-6). 28 tablet 0   traZODone (DESYREL) 100 MG tablet Take 1 tablet (100 mg total) by mouth at bedtime. 30 tablet 0   No current facility-administered medications for this visit.     ROS:  See HPI  Physical Exam:  Today's Vitals   03/24/23 1241 03/24/23 1245  BP: (!) 183/107   Pulse: 85   Resp: 18   Temp: 98 F (36.7 C)   TempSrc: Temporal   SpO2: 98%   Height: 5\' 6"  (1.676 m)   PainSc: 7  7   PainLoc: Leg    Body mass index is 27.6 kg/m.   Incision:    Extremities:  left foot  with 3+ DP pulse     Assessment/Plan:  This is a 43 y.o. female who is s/p: right above knee amputation 02/05/2023 by Dr. Lenell Antu after non healing below knee amputation performed at [facility] 01/17/2023.  -pt's right AKA is healing nicely.  Her sutures and staples were removed today and she tolerated this well.  She was given Rx for hanger for prosthesis.  -she has easily palpable left DP pulse.  Given her issues on the right leg, will have her return in 6 months with ABI to get baseline.  She knows to call if she develops any rest pain or non healing wounds. -she continues to have pain in the right AKA.  She has tried Motrin/Tylenol and gabapentin.  Will put in referral for pain management clinic.   -her BP was elevated today.  She states that she does not normally run this high.  I have asked her to take her pressure at home and record it twice a day and if it remains elevated at home, she would need to f/u with her PCP.     The patient has a right Above Knee Amputation. The patient is well motivated to return to their prior functional status by utilizing a prosthesis to perform ADL's and maintain a healthy lifestyle. The patient has the physical and cognitive capacity to function with a prosthesis.   Functional Level: K3 Community Ambulator: Has the ability or potential for ambulation with variable cadence, to traverse most environmental barriers, and may have vocational, therapeutic, or exercise activity that demands prosthetic utilization beyond simple locomotion. Pt may benefit from Human resources officer.   Residual Limb History: The skin condition of the residual limb is healed. The patient will continue to monitor the skin of the residual limb and follow hygiene instructions.  The patient is experiencing phantom limb pain  and residual limb pain  Prosthetic Prescription Plan: Counseling and education regarding prosthetic management will be provided to the patient via a certified  prosthetist. A multi-discipline team, including physical therapy, will manage the prosthetic fabrication, fitting and prosthetic gait training.     Doreatha Massed, [facility] [phone]   Clinic MD:  Chestine Spore

## 2023-03-31 ENCOUNTER — Other Ambulatory Visit: Payer: Self-pay

## 2023-03-31 DIAGNOSIS — I998 Other disorder of circulatory system: Secondary | ICD-10-CM

## 2023-04-07 ENCOUNTER — Encounter: Payer: Medicaid Other | Attending: Registered Nurse | Admitting: Physical Medicine and Rehabilitation

## 2023-06-23 ENCOUNTER — Encounter (INDEPENDENT_AMBULATORY_CARE_PROVIDER_SITE_OTHER): Payer: Self-pay

## 2023-07-03 ENCOUNTER — Other Ambulatory Visit: Payer: Self-pay | Admitting: Vascular Surgery

## 2023-07-03 ENCOUNTER — Telehealth (INDEPENDENT_AMBULATORY_CARE_PROVIDER_SITE_OTHER): Payer: Self-pay

## 2023-07-03 ENCOUNTER — Telehealth: Payer: Self-pay

## 2023-07-03 ENCOUNTER — Other Ambulatory Visit: Payer: Self-pay | Admitting: Family Medicine

## 2023-07-03 DIAGNOSIS — R600 Localized edema: Secondary | ICD-10-CM

## 2023-07-03 DIAGNOSIS — M7989 Other specified soft tissue disorders: Secondary | ICD-10-CM

## 2023-07-03 NOTE — Telephone Encounter (Signed)
 Pt called reporting swelling of left leg that began a month ago.  Patient reported cool toes and numb foot. She is concerned about losing the left leg since she already had her right leg amputated.  Patient reported her PCP took her off of her Eliquis  for 2 weeks before she started her on Xarelto.  Patient repoted she hasn't missed an doses of her Xarelto.  Her follow-up appts were rescheduled for July 13, 2023 and she will see Dr. Edgardo Goodwill on July 14, 2023.  Patient was notified of appts. She also has a Venous DVT US  scheduled for July 09, 2023.    Patient advised to go to the ED if she experiences increased swelling or pain, if her foot becomes cold and/or discolored.

## 2023-07-03 NOTE — Telephone Encounter (Signed)
 Not our pt this pt is seen in [place] at [facility] I tried to call her multiple times with no answer

## 2023-07-09 ENCOUNTER — Ambulatory Visit: Admission: RE | Admit: 2023-07-09 | Source: Ambulatory Visit

## 2023-07-13 ENCOUNTER — Ambulatory Visit (HOSPITAL_COMMUNITY)
Admission: RE | Admit: 2023-07-13 | Discharge: 2023-07-13 | Disposition: A | Discharge status: Home / Self Care | Source: Ambulatory Visit | Attending: Surgery | Admitting: Surgery

## 2023-07-13 ENCOUNTER — Ambulatory Visit (HOSPITAL_COMMUNITY)
Admission: RE | Admit: 2023-07-13 | Discharge: 2023-07-13 | Disposition: A | Discharge status: Home / Self Care | Source: Ambulatory Visit | Attending: Vascular Surgery | Admitting: Vascular Surgery

## 2023-07-13 DIAGNOSIS — M7989 Other specified soft tissue disorders: Secondary | ICD-10-CM | POA: Diagnosis present

## 2023-07-13 LAB — VAS US ABI WITH/WO TBI: Left ABI: 1.19

## 2023-07-14 ENCOUNTER — Ambulatory Visit: Attending: Vascular Surgery | Admitting: Vascular Surgery

## 2023-08-21 ENCOUNTER — Encounter: Payer: Self-pay | Admitting: Advanced Practice Midwife

## 2023-09-22 ENCOUNTER — Encounter (HOSPITAL_COMMUNITY): Payer: Medicaid Other

## 2023-09-22 ENCOUNTER — Ambulatory Visit: Payer: Medicaid Other

## 2023-10-13 ENCOUNTER — Ambulatory Visit

## 2023-10-13 ENCOUNTER — Encounter (HOSPITAL_COMMUNITY)

## 2023-11-24 ENCOUNTER — Ambulatory Visit (HOSPITAL_COMMUNITY)
Admission: RE | Admit: 2023-11-24 | Discharge: 2023-11-24 | Disposition: A | Discharge status: Home / Self Care | Source: Ambulatory Visit | Attending: Vascular Surgery | Admitting: Vascular Surgery

## 2023-11-24 ENCOUNTER — Ambulatory Visit

## 2023-11-24 DIAGNOSIS — I998 Other disorder of circulatory system: Secondary | ICD-10-CM | POA: Diagnosis not present

## 2023-11-24 LAB — VAS US ABI WITH/WO TBI: Left ABI: 1.15

## 2024-02-15 ENCOUNTER — Telehealth (HOSPITAL_COMMUNITY): Payer: Self-pay

## 2024-02-15 ENCOUNTER — Other Ambulatory Visit (HOSPITAL_COMMUNITY): Payer: Self-pay | Admitting: Obstetrics and Gynecology

## 2024-02-15 ENCOUNTER — Other Ambulatory Visit: Payer: Self-pay | Admitting: Obstetrics and Gynecology

## 2024-02-15 DIAGNOSIS — D500 Iron deficiency anemia secondary to blood loss (chronic): Secondary | ICD-10-CM

## 2024-02-15 NOTE — Progress Notes (Signed)
 Referral for Venofer  300mg  IV weekly x 4 doses received  Jaleigha Deane, PharmD, MPH, BCPS, CPP Clinical Pharmacist

## 2024-02-15 NOTE — Telephone Encounter (Signed)
 Auth Submission: NO AUTH NEEDED Site of care: Site of care: [facility] Payer: [employer] Medication & CPT/J Code(s) submitted: Venofer  (Iron  Sucrose) J1756 Diagnosis Code: D64.9 Route of submission (phone, fax, portal):  Phone # Fax # Auth type: Buy/Bill HB Units/visits requested: 300mg  x 4 doses Reference number:  Approval from: 02/15/24 to 05/15/24

## 2024-02-15 NOTE — Progress Notes (Signed)
 Weekly venofer   iv 300 mg x4 weeks

## 2024-02-22 ENCOUNTER — Emergency Department
Admission: EM | Admit: 2024-02-22 | Discharge: 2024-02-23 | Attending: Emergency Medicine | Admitting: Emergency Medicine

## 2024-02-22 ENCOUNTER — Other Ambulatory Visit: Payer: Self-pay

## 2024-02-22 ENCOUNTER — Emergency Department

## 2024-02-22 DIAGNOSIS — D649 Anemia, unspecified: Secondary | ICD-10-CM

## 2024-02-22 DIAGNOSIS — I129 Hypertensive chronic kidney disease with stage 1 through stage 4 chronic kidney disease, or unspecified chronic kidney disease: Secondary | ICD-10-CM | POA: Insufficient documentation

## 2024-02-22 DIAGNOSIS — Z5329 Procedure and treatment not carried out because of patient's decision for other reasons: Secondary | ICD-10-CM | POA: Insufficient documentation

## 2024-02-22 DIAGNOSIS — N189 Chronic kidney disease, unspecified: Secondary | ICD-10-CM | POA: Insufficient documentation

## 2024-02-22 DIAGNOSIS — D631 Anemia in chronic kidney disease: Secondary | ICD-10-CM | POA: Insufficient documentation

## 2024-02-22 DIAGNOSIS — E1022 Type 1 diabetes mellitus with diabetic chronic kidney disease: Secondary | ICD-10-CM | POA: Insufficient documentation

## 2024-02-22 DIAGNOSIS — R079 Chest pain, unspecified: Secondary | ICD-10-CM | POA: Diagnosis present

## 2024-02-22 LAB — CBC
HCT: 24.1 % — ABNORMAL LOW (ref 36.0–46.0)
Hemoglobin: 6 g/dL — ABNORMAL LOW (ref 12.0–15.0)
MCH: 14.7 pg — ABNORMAL LOW (ref 26.0–34.0)
MCHC: 24.9 g/dL — ABNORMAL LOW (ref 30.0–36.0)
MCV: 59.2 fL — ABNORMAL LOW (ref 80.0–100.0)
Platelets: 517 K/uL — ABNORMAL HIGH (ref 150–400)
RBC: 4.07 MIL/uL (ref 3.87–5.11)
RDW: 21.9 % — ABNORMAL HIGH (ref 11.5–15.5)
WBC: 15.1 K/uL — ABNORMAL HIGH (ref 4.0–10.5)
nRBC: 0.2 % (ref 0.0–0.2)

## 2024-02-22 LAB — BASIC METABOLIC PANEL WITH GFR
Anion gap: 15 (ref 5–15)
BUN: 18 mg/dL (ref 6–20)
CO2: 17 mmol/L — ABNORMAL LOW (ref 22–32)
Calcium: 9 mg/dL (ref 8.9–10.3)
Chloride: 104 mmol/L (ref 98–111)
Creatinine, Ser: 1.31 mg/dL — ABNORMAL HIGH (ref 0.44–1.00)
GFR, Estimated: 51 mL/min — ABNORMAL LOW
Glucose, Bld: 197 mg/dL — ABNORMAL HIGH (ref 70–99)
Potassium: 4 mmol/L (ref 3.5–5.1)
Sodium: 137 mmol/L (ref 135–145)

## 2024-02-22 LAB — PREPARE RBC (CROSSMATCH)

## 2024-02-22 LAB — SAMPLE TO BLOOD BANK

## 2024-02-22 LAB — TROPONIN T, HIGH SENSITIVITY: Troponin T High Sensitivity: <15 ng/L (ref 0–19)

## 2024-02-22 MED ORDER — SODIUM CHLORIDE 0.9 % IV SOLN: 10.0000 mL/h | Freq: Once | INTRAVENOUS | Status: DC

## 2024-02-22 NOTE — ED Notes (Signed)
 Blood product administration confirmed and verified with Powell Ruth, RN.

## 2024-02-22 NOTE — ED Provider Notes (Incomplete)
.----------------------------------------- °  11:15 PM on 02/22/2024 -----------------------------------------  Blood pressure 117/66, pulse (!) 103, temperature 98.6 F (37 C), temperature source Oral, resp. rate 20, height 5' 6 (1.676 m), weight 108.9 kg, SpO2 100%.   In short, Jennifer [name] is a 44 y.o. female with a chief complaint of anemia and generalized weakness.  Refer to the original H&P for additional details.  The current plan of care is to repeat CBC after transfusion, if improving able to be discharged with outpatient follow-up.  Repeat CBC after transfusion hemoglobin 6.4.  Reassessment patient is feeling significantly better, is asking if she can go home and follow-up with her hematologist outpatient.  Discussed with her that ideally we would like her hemoglobin to be above 7.  She denies any hematochezia or melena, no vaginal bleeding.  She is agreeable to stay for 1 unit of packed RBCs and repeat CBC.  Will put those orders in.  Patient now does not want to stay.  States that her husband needs to go to work and she has a daughter at home that she needs to attend to.  States that she understands the risk of leaving including decompensation and death.  States that she will come back in the morning as soon as she can.  Did emphasize to her that she is to come back so that she can receive another blood transfusion.  She is agreeable with this plan.  Will discharge her AMA.   SABRACRITICAL CARE Performed by: Lorelle Jerri Cheadle   Total critical care time: 35 minutes  Critical care time was exclusive of separately billable procedures and treating other patients.  Critical care was necessary to treat or prevent imminent or life-threatening deterioration.  Critical care was time spent personally by me on the following activities: development of treatment plan with patient and/or surrogate as well as nursing, discussions with consultants, evaluation of patient's response to treatment,  examination of patient, obtaining history from patient or surrogate, ordering and performing treatments and interventions, ordering and review of laboratory studies, ordering and review of radiographic studies, pulse oximetry and re-evaluation of patient's condition.      Medications  0.9 %  sodium chloride  infusion (10 mL/hr Intravenous Not Given 02/23/24 0009)  0.9 %  sodium chloride  infusion (has no administration in time range)     ED Discharge Orders     None      Final diagnoses:  Symptomatic anemia      Cheadle Lorelle Jerri, MD 02/23/24 0130    Cheadle Lorelle Jerri, MD 02/23/24 [phone]

## 2024-02-22 NOTE — ED Provider Notes (Signed)
 "  [facility] Provider Note    Event Date/Time   First MD Initiated Contact with Patient 02/22/24 1837     (approximate)   History   Abnormal labs and Chest Pain   HPI  GEORGEANNE FRANKLAND is a 44 y.o. female with a history of type 1 diabetes, CKD, OSA, hypertension, atrial flutter, anemia of chronic disease, menorrhagia and fibroids who presents with generalized weakness and concern for anemia.  The patient states that about a week ago she was told that her hemoglobin was 5 after her appoint with OB/GYN.  She was waiting to hear about a follow-up so she could come in and get a transfusion if needed, but had not heard anything.  Today she was feeling weak and lightheaded.  She has a lower extremity amputation so was concerned about falling and decided to come in.  She denies any chest pain or difficulty breathing.  She is not currently having any vaginal bleeding or any other abnormal bleeding.  I reviewed the past medical records.  The patient's most recent outpatient encounter was for Venofer  infusion on 1/12.  She was admitted to the hospitalist service in December 2024 for a limb amputation.   Physical Exam   Triage Vital Signs: ED Triage Vitals  Encounter Vitals Group     BP 02/22/24 1810 (!) 152/76     Girls Systolic BP Percentile --      Girls Diastolic BP Percentile --      Boys Systolic BP Percentile --      Boys Diastolic BP Percentile --      Pulse Rate 02/22/24 1810 (!) 109     Resp 02/22/24 1810 19     Temp 02/22/24 1810 98.5 F (36.9 C)     Temp Source 02/22/24 1810 Oral     SpO2 02/22/24 1810 100 %     Weight 02/22/24 1815 240 lb (108.9 kg)     Height 02/22/24 1815 5' 6 (1.676 m)     Head Circumference --      Peak Flow --      Pain Score 02/22/24 1811 10     Pain Loc --      Pain Education --      Exclude from Growth Chart --     Most recent vital signs: Vitals:   02/22/24 2100 02/22/24 2115  BP: 109/62 122/72  Pulse: 97 100   Resp: (!) 21 (!) 22  Temp:  98.6 F (37 C)  SpO2: 100% 100%     General: Awake, no distress.  CV:  Good peripheral perfusion.  Resp:  Normal effort.  Abd:  No distention.  Other:  Pale appearing.   ED Results / Procedures / Treatments   Labs (all labs ordered are listed, but only abnormal results are displayed) Labs Reviewed  BASIC METABOLIC PANEL WITH GFR - Abnormal; Notable for the following components:      Result Value   CO2 17 (*)    Glucose, Bld 197 (*)    Creatinine, Ser 1.31 (*)    GFR, Estimated 51 (*)    All other components within normal limits  CBC - Abnormal; Notable for the following components:   WBC 15.1 (*)    Hemoglobin 6.0 (*)    HCT 24.1 (*)    MCV 59.2 (*)    MCH 14.7 (*)    MCHC 24.9 (*)    RDW 21.9 (*)    Platelets 517 (*)  All other components within normal limits  SAMPLE TO BLOOD BANK  TYPE AND SCREEN  PREPARE RBC (CROSSMATCH)  TROPONIN T, HIGH SENSITIVITY     EKG    RADIOLOGY  Chest x-ray: I independently viewed interpreted images; there is no focal consolidation or edema  PROCEDURES:  Critical Care performed: No  Procedures   MEDICATIONS ORDERED IN ED: Medications  0.9 %  sodium chloride  infusion (has no administration in time range)     IMPRESSION / MDM / ASSESSMENT AND PLAN / ED COURSE  I reviewed the triage vital signs and the nursing notes.  44 year old female with PMH as noted above presents with generalized weakness, lightheadedness, and concern for anemia.  Differential diagnosis includes, but is not limited to, anemia of chronic disease, less likely iron  deficiency anemia or acute blood loss.  CBC today shows hemoglobin of 6.  BMP shows no acute findings.  I have ordered a unit of PRBCs for transfusion.  Patient's presentation is most consistent with acute presentation with potential threat to life or bodily function.  The patient is on the cardiac monitor to evaluate for evidence of arrhythmia and/or  significant heart rate changes.   ----------------------------------------- 11:01 PM on 02/22/2024 -----------------------------------------  The patient is still in the process of receiving her PRBCs.  At that time we will obtain a repeat CBC and assess the patient's symptoms to determine disposition.  I we will sign the patient out to the oncoming ED physician Dr. Waymond.  FINAL CLINICAL IMPRESSION(S) / ED DIAGNOSES   Final diagnoses:  Symptomatic anemia     Rx / DC Orders   ED Discharge Orders     None        Note:  This document was prepared using Dragon voice recognition software and may include unintentional dictation errors.    Jacolyn Pae, MD 02/22/24 2301  "

## 2024-02-22 NOTE — ED Notes (Signed)
 Blood product administration explained to pt including the risks, benefits, and alternatives. Pt endorses verbal understanding. Blood product administration consent is obtained, e-signed, and witnessed at this time as found under consents.

## 2024-02-22 NOTE — ED Triage Notes (Addendum)
 Pt to ED for c/o abnormal labs. Pt states that she had a doctor's appointment last week and was told her hgb was 5. States MD was supposed to call her back but hasn't. Pt states she has been lightheaded today and broke out into a cold sweat.  Pt states chest pain and states hx of Afib. Pt on Xarelto.  Pt states hx of blood transfusions.

## 2024-02-22 NOTE — ED Notes (Signed)
Attempted IV x 2 with no success. 

## 2024-02-23 DIAGNOSIS — Z9289 Personal history of other medical treatment: Secondary | ICD-10-CM

## 2024-02-23 HISTORY — DX: Personal history of other medical treatment: Z92.89

## 2024-02-23 LAB — TYPE AND SCREEN
ABO/RH(D): B POS
Antibody Screen: NEGATIVE
Unit division: 0
Unit division: 0

## 2024-02-23 LAB — BPAM RBC
Blood Product Expiration Date: 202602152359
Blood Product Expiration Date: 202602142359
ISSUE DATE / TIME: 202601192046
Unit Type and Rh: 7300
Unit Type and Rh: 5100

## 2024-02-23 LAB — CBC WITH DIFFERENTIAL/PLATELET
Abs Immature Granulocytes: 0.08 K/uL — ABNORMAL HIGH (ref 0.00–0.07)
Basophils Absolute: 0 K/uL (ref 0.0–0.1)
Basophils Relative: 0 %
Eosinophils Absolute: 0.3 K/uL (ref 0.0–0.5)
Eosinophils Relative: 2 %
HCT: 24.2 % — ABNORMAL LOW (ref 36.0–46.0)
Hemoglobin: 6.4 g/dL — ABNORMAL LOW (ref 12.0–15.0)
Immature Granulocytes: 1 %
Lymphocytes Relative: 19 %
Lymphs Abs: 2.8 K/uL (ref 0.7–4.0)
MCH: 16.5 pg — ABNORMAL LOW (ref 26.0–34.0)
MCHC: 26.4 g/dL — ABNORMAL LOW (ref 30.0–36.0)
MCV: 62.5 fL — ABNORMAL LOW (ref 80.0–100.0)
Monocytes Absolute: 1 K/uL (ref 0.1–1.0)
Monocytes Relative: 7 %
Neutro Abs: 10.5 K/uL — ABNORMAL HIGH (ref 1.7–7.7)
Neutrophils Relative %: 71 %
Platelets: 461 K/uL — ABNORMAL HIGH (ref 150–400)
RBC: 3.87 MIL/uL (ref 3.87–5.11)
RDW: 26.5 % — ABNORMAL HIGH (ref 11.5–15.5)
Smear Review: NORMAL
WBC: 14.7 K/uL — ABNORMAL HIGH (ref 4.0–10.5)
nRBC: 0.2 % (ref 0.0–0.2)

## 2024-02-23 LAB — PREPARE RBC (CROSSMATCH)

## 2024-02-23 MED ORDER — SODIUM CHLORIDE 0.9 % IV SOLN: Freq: Once | INTRAVENOUS | Status: DC

## 2024-02-23 NOTE — ED Notes (Signed)
 Pt needing 2nd blood transfusion d/t anemia on CBC after initial transfusion. Pt is declining to stay at this time. Pt is explained the benefits of staying and the risks of leaving, up to and including death. Pt endorses verbal understanding. Pt is adamant on leaving at this time. AMA paperwork is signed via e-sign and noted on chart. MD aware.

## 2024-02-23 NOTE — Discharge Instructions (Signed)
 Please return to the emergency department as soon as you can to get your blood transfusion since her hemoglobin is less than 7 on recheck.

## 2024-02-26 ENCOUNTER — Ambulatory Visit
Admission: RE | Admit: 2024-02-26 | Discharge: 2024-02-26 | Disposition: A | Discharge status: Home / Self Care | Source: Ambulatory Visit | Attending: Obstetrics and Gynecology | Admitting: Obstetrics and Gynecology

## 2024-02-26 VITALS — BP 129/87 | HR 87 | Temp 97.4°F | Resp 16 | Ht 66.0 in | Wt 240.0 lb

## 2024-02-26 DIAGNOSIS — D500 Iron deficiency anemia secondary to blood loss (chronic): Secondary | ICD-10-CM | POA: Diagnosis present

## 2024-02-26 MED ORDER — IRON SUCROSE 300 MG IVPB - SIMPLE MED
300.0000 mg | Freq: Once | Status: AC
  Administered 2024-02-26: 300 mg via INTRAVENOUS
  Filled 2024-02-26: qty 300

## 2024-03-04 ENCOUNTER — Ambulatory Visit
Admission: RE | Admit: 2024-03-04 | Discharge: 2024-03-04 | Disposition: A | Discharge status: Home / Self Care | Source: Ambulatory Visit | Attending: Obstetrics and Gynecology | Admitting: Obstetrics and Gynecology

## 2024-03-04 VITALS — BP 137/89 | HR 92 | Temp 97.0°F | Resp 18

## 2024-03-04 DIAGNOSIS — D500 Iron deficiency anemia secondary to blood loss (chronic): Secondary | ICD-10-CM | POA: Insufficient documentation

## 2024-03-04 MED ORDER — IRON SUCROSE 300 MG IVPB - SIMPLE MED
300.0000 mg | Freq: Once | Status: AC
  Administered 2024-03-04: 300 mg via INTRAVENOUS
  Filled 2024-03-04: qty 300

## 2024-03-11 ENCOUNTER — Ambulatory Visit
Admission: RE | Admit: 2024-03-11 | Discharge: 2024-03-11 | Disposition: A | Source: Ambulatory Visit | Attending: Obstetrics and Gynecology | Admitting: Obstetrics and Gynecology

## 2024-03-11 VITALS — BP 149/87 | HR 98 | Temp 97.6°F | Resp 18

## 2024-03-11 DIAGNOSIS — D500 Iron deficiency anemia secondary to blood loss (chronic): Secondary | ICD-10-CM

## 2024-03-11 MED ORDER — IRON SUCROSE 300 MG IVPB - SIMPLE MED
300.0000 mg | Freq: Once | Status: AC
  Administered 2024-03-11: 300 mg via INTRAVENOUS
  Filled 2024-03-11: qty 300

## 2024-03-18 ENCOUNTER — Ambulatory Visit: Admission: RE | Admit: 2024-03-18 | Source: Ambulatory Visit
